# Patient Record
Sex: Male | Born: 1985
Health system: Southern US, Community
[De-identification: ages and names within clinical notes are randomized; demographics above are authoritative.]

## PROBLEM LIST (undated history)

## (undated) DIAGNOSIS — D571 Sickle-cell disease without crisis: Secondary | ICD-10-CM

## (undated) DIAGNOSIS — J189 Pneumonia, unspecified organism: Secondary | ICD-10-CM

## (undated) HISTORY — DX: Sickle-cell disease without crisis: D57.1

## (undated) HISTORY — PX: NO PAST SURGERIES: SHX2092

---

## 2011-02-21 ENCOUNTER — Emergency Department (HOSPITAL_COMMUNITY)
Admission: EM | Admit: 2011-02-21 | Discharge: 2011-02-21 | Disposition: A | Discharge status: Home / Self Care | Payer: Medicaid Other | Attending: Emergency Medicine | Admitting: Emergency Medicine

## 2011-02-21 DIAGNOSIS — S81009A Unspecified open wound, unspecified knee, initial encounter: Secondary | ICD-10-CM | POA: Insufficient documentation

## 2011-02-21 DIAGNOSIS — Y9289 Other specified places as the place of occurrence of the external cause: Secondary | ICD-10-CM | POA: Insufficient documentation

## 2011-02-21 DIAGNOSIS — W268XXA Contact with other sharp object(s), not elsewhere classified, initial encounter: Secondary | ICD-10-CM | POA: Insufficient documentation

## 2011-02-21 DIAGNOSIS — D571 Sickle-cell disease without crisis: Secondary | ICD-10-CM | POA: Insufficient documentation

## 2011-07-01 DIAGNOSIS — B338 Other specified viral diseases: Secondary | ICD-10-CM | POA: Diagnosis not present

## 2011-07-01 DIAGNOSIS — D571 Sickle-cell disease without crisis: Secondary | ICD-10-CM | POA: Diagnosis not present

## 2011-07-01 DIAGNOSIS — R52 Pain, unspecified: Secondary | ICD-10-CM | POA: Diagnosis not present

## 2011-07-01 DIAGNOSIS — R05 Cough: Secondary | ICD-10-CM | POA: Diagnosis not present

## 2011-07-01 DIAGNOSIS — R059 Cough, unspecified: Secondary | ICD-10-CM | POA: Diagnosis not present

## 2011-07-01 DIAGNOSIS — J069 Acute upper respiratory infection, unspecified: Secondary | ICD-10-CM | POA: Diagnosis not present

## 2011-07-22 DIAGNOSIS — Z79899 Other long term (current) drug therapy: Secondary | ICD-10-CM | POA: Diagnosis not present

## 2011-07-22 DIAGNOSIS — Z5181 Encounter for therapeutic drug level monitoring: Secondary | ICD-10-CM | POA: Diagnosis not present

## 2011-07-22 DIAGNOSIS — F17200 Nicotine dependence, unspecified, uncomplicated: Secondary | ICD-10-CM | POA: Diagnosis not present

## 2011-07-22 DIAGNOSIS — D571 Sickle-cell disease without crisis: Secondary | ICD-10-CM | POA: Diagnosis not present

## 2011-07-22 DIAGNOSIS — R109 Unspecified abdominal pain: Secondary | ICD-10-CM | POA: Diagnosis not present

## 2011-08-11 DIAGNOSIS — R059 Cough, unspecified: Secondary | ICD-10-CM | POA: Diagnosis not present

## 2011-08-11 DIAGNOSIS — B9789 Other viral agents as the cause of diseases classified elsewhere: Secondary | ICD-10-CM | POA: Diagnosis not present

## 2011-08-11 DIAGNOSIS — J310 Chronic rhinitis: Secondary | ICD-10-CM | POA: Diagnosis not present

## 2011-08-11 DIAGNOSIS — R05 Cough: Secondary | ICD-10-CM | POA: Diagnosis not present

## 2011-08-25 DIAGNOSIS — S81009A Unspecified open wound, unspecified knee, initial encounter: Secondary | ICD-10-CM | POA: Diagnosis not present

## 2011-08-25 DIAGNOSIS — D571 Sickle-cell disease without crisis: Secondary | ICD-10-CM | POA: Diagnosis not present

## 2011-08-25 DIAGNOSIS — Z79899 Other long term (current) drug therapy: Secondary | ICD-10-CM | POA: Diagnosis not present

## 2011-08-25 DIAGNOSIS — S81809A Unspecified open wound, unspecified lower leg, initial encounter: Secondary | ICD-10-CM | POA: Diagnosis not present

## 2011-09-02 DIAGNOSIS — D571 Sickle-cell disease without crisis: Secondary | ICD-10-CM | POA: Diagnosis not present

## 2011-09-02 DIAGNOSIS — F17200 Nicotine dependence, unspecified, uncomplicated: Secondary | ICD-10-CM | POA: Diagnosis not present

## 2011-09-02 DIAGNOSIS — Z79899 Other long term (current) drug therapy: Secondary | ICD-10-CM | POA: Diagnosis not present

## 2011-09-02 DIAGNOSIS — R109 Unspecified abdominal pain: Secondary | ICD-10-CM | POA: Diagnosis not present

## 2011-09-02 DIAGNOSIS — Z5181 Encounter for therapeutic drug level monitoring: Secondary | ICD-10-CM | POA: Diagnosis not present

## 2011-09-09 DIAGNOSIS — R51 Headache: Secondary | ICD-10-CM | POA: Diagnosis not present

## 2011-09-09 DIAGNOSIS — D5700 Hb-SS disease with crisis, unspecified: Secondary | ICD-10-CM | POA: Diagnosis not present

## 2011-09-09 DIAGNOSIS — F17200 Nicotine dependence, unspecified, uncomplicated: Secondary | ICD-10-CM | POA: Diagnosis not present

## 2011-09-09 DIAGNOSIS — M545 Low back pain, unspecified: Secondary | ICD-10-CM | POA: Diagnosis not present

## 2011-09-09 DIAGNOSIS — Z79899 Other long term (current) drug therapy: Secondary | ICD-10-CM | POA: Diagnosis not present

## 2011-09-09 DIAGNOSIS — Z880 Allergy status to penicillin: Secondary | ICD-10-CM | POA: Diagnosis not present

## 2011-09-29 DIAGNOSIS — D571 Sickle-cell disease without crisis: Secondary | ICD-10-CM | POA: Diagnosis not present

## 2011-09-29 DIAGNOSIS — J029 Acute pharyngitis, unspecified: Secondary | ICD-10-CM | POA: Diagnosis not present

## 2011-09-29 DIAGNOSIS — Z79899 Other long term (current) drug therapy: Secondary | ICD-10-CM | POA: Diagnosis not present

## 2011-09-29 DIAGNOSIS — J310 Chronic rhinitis: Secondary | ICD-10-CM | POA: Diagnosis not present

## 2011-09-29 DIAGNOSIS — IMO0001 Reserved for inherently not codable concepts without codable children: Secondary | ICD-10-CM | POA: Diagnosis not present

## 2011-09-29 DIAGNOSIS — J00 Acute nasopharyngitis [common cold]: Secondary | ICD-10-CM | POA: Diagnosis not present

## 2011-09-29 DIAGNOSIS — R070 Pain in throat: Secondary | ICD-10-CM | POA: Diagnosis not present

## 2011-10-13 DIAGNOSIS — Z79899 Other long term (current) drug therapy: Secondary | ICD-10-CM | POA: Diagnosis not present

## 2011-10-13 DIAGNOSIS — D5700 Hb-SS disease with crisis, unspecified: Secondary | ICD-10-CM | POA: Diagnosis not present

## 2011-11-03 DIAGNOSIS — Z79899 Other long term (current) drug therapy: Secondary | ICD-10-CM | POA: Diagnosis not present

## 2011-11-03 DIAGNOSIS — D5700 Hb-SS disease with crisis, unspecified: Secondary | ICD-10-CM | POA: Diagnosis not present

## 2011-11-03 DIAGNOSIS — R52 Pain, unspecified: Secondary | ICD-10-CM | POA: Diagnosis not present

## 2011-11-03 DIAGNOSIS — R51 Headache: Secondary | ICD-10-CM | POA: Diagnosis not present

## 2011-11-03 DIAGNOSIS — IMO0001 Reserved for inherently not codable concepts without codable children: Secondary | ICD-10-CM | POA: Diagnosis not present

## 2011-11-10 DIAGNOSIS — D571 Sickle-cell disease without crisis: Secondary | ICD-10-CM | POA: Diagnosis not present

## 2011-11-10 DIAGNOSIS — M549 Dorsalgia, unspecified: Secondary | ICD-10-CM | POA: Diagnosis not present

## 2011-11-10 DIAGNOSIS — M542 Cervicalgia: Secondary | ICD-10-CM | POA: Diagnosis not present

## 2011-11-15 DIAGNOSIS — D571 Sickle-cell disease without crisis: Secondary | ICD-10-CM | POA: Diagnosis not present

## 2011-11-15 DIAGNOSIS — IMO0001 Reserved for inherently not codable concepts without codable children: Secondary | ICD-10-CM | POA: Diagnosis not present

## 2011-12-02 DIAGNOSIS — F17200 Nicotine dependence, unspecified, uncomplicated: Secondary | ICD-10-CM | POA: Diagnosis not present

## 2011-12-02 DIAGNOSIS — R51 Headache: Secondary | ICD-10-CM | POA: Diagnosis not present

## 2011-12-02 DIAGNOSIS — S139XXA Sprain of joints and ligaments of unspecified parts of neck, initial encounter: Secondary | ICD-10-CM | POA: Diagnosis not present

## 2011-12-02 DIAGNOSIS — R52 Pain, unspecified: Secondary | ICD-10-CM | POA: Diagnosis not present

## 2011-12-02 DIAGNOSIS — S060X9A Concussion with loss of consciousness of unspecified duration, initial encounter: Secondary | ICD-10-CM | POA: Diagnosis not present

## 2011-12-02 DIAGNOSIS — Z5181 Encounter for therapeutic drug level monitoring: Secondary | ICD-10-CM | POA: Diagnosis not present

## 2011-12-02 DIAGNOSIS — Z79899 Other long term (current) drug therapy: Secondary | ICD-10-CM | POA: Diagnosis not present

## 2011-12-02 DIAGNOSIS — D571 Sickle-cell disease without crisis: Secondary | ICD-10-CM | POA: Diagnosis not present

## 2011-12-02 DIAGNOSIS — S060XAA Concussion with loss of consciousness status unknown, initial encounter: Secondary | ICD-10-CM | POA: Diagnosis not present

## 2012-01-06 DIAGNOSIS — IMO0001 Reserved for inherently not codable concepts without codable children: Secondary | ICD-10-CM | POA: Diagnosis not present

## 2012-01-06 DIAGNOSIS — Z79899 Other long term (current) drug therapy: Secondary | ICD-10-CM | POA: Diagnosis not present

## 2012-01-06 DIAGNOSIS — D571 Sickle-cell disease without crisis: Secondary | ICD-10-CM | POA: Diagnosis not present

## 2012-01-20 DIAGNOSIS — R52 Pain, unspecified: Secondary | ICD-10-CM | POA: Insufficient documentation

## 2012-01-20 DIAGNOSIS — Z79899 Other long term (current) drug therapy: Secondary | ICD-10-CM | POA: Diagnosis not present

## 2012-01-20 DIAGNOSIS — Z5181 Encounter for therapeutic drug level monitoring: Secondary | ICD-10-CM | POA: Diagnosis not present

## 2012-01-20 DIAGNOSIS — D571 Sickle-cell disease without crisis: Secondary | ICD-10-CM | POA: Insufficient documentation

## 2012-01-25 DIAGNOSIS — F17200 Nicotine dependence, unspecified, uncomplicated: Secondary | ICD-10-CM | POA: Diagnosis not present

## 2012-01-25 DIAGNOSIS — D5700 Hb-SS disease with crisis, unspecified: Secondary | ICD-10-CM | POA: Diagnosis not present

## 2012-01-25 DIAGNOSIS — Z79899 Other long term (current) drug therapy: Secondary | ICD-10-CM | POA: Diagnosis not present

## 2012-01-25 DIAGNOSIS — M549 Dorsalgia, unspecified: Secondary | ICD-10-CM | POA: Diagnosis not present

## 2012-02-01 DIAGNOSIS — M549 Dorsalgia, unspecified: Secondary | ICD-10-CM | POA: Diagnosis not present

## 2012-02-01 DIAGNOSIS — IMO0001 Reserved for inherently not codable concepts without codable children: Secondary | ICD-10-CM | POA: Diagnosis not present

## 2012-02-01 DIAGNOSIS — Z79899 Other long term (current) drug therapy: Secondary | ICD-10-CM | POA: Diagnosis not present

## 2012-02-01 DIAGNOSIS — D571 Sickle-cell disease without crisis: Secondary | ICD-10-CM | POA: Diagnosis not present

## 2012-02-01 DIAGNOSIS — R109 Unspecified abdominal pain: Secondary | ICD-10-CM | POA: Diagnosis not present

## 2012-02-01 DIAGNOSIS — R079 Chest pain, unspecified: Secondary | ICD-10-CM | POA: Diagnosis not present

## 2012-02-12 DIAGNOSIS — R109 Unspecified abdominal pain: Secondary | ICD-10-CM | POA: Diagnosis not present

## 2012-02-12 DIAGNOSIS — D571 Sickle-cell disease without crisis: Secondary | ICD-10-CM | POA: Diagnosis not present

## 2012-02-12 DIAGNOSIS — Z79899 Other long term (current) drug therapy: Secondary | ICD-10-CM | POA: Diagnosis not present

## 2012-02-12 DIAGNOSIS — R1084 Generalized abdominal pain: Secondary | ICD-10-CM | POA: Diagnosis not present

## 2012-02-23 DIAGNOSIS — G8929 Other chronic pain: Secondary | ICD-10-CM | POA: Diagnosis not present

## 2012-02-23 DIAGNOSIS — R079 Chest pain, unspecified: Secondary | ICD-10-CM | POA: Diagnosis not present

## 2012-02-23 DIAGNOSIS — G894 Chronic pain syndrome: Secondary | ICD-10-CM | POA: Diagnosis not present

## 2012-02-23 DIAGNOSIS — D5700 Hb-SS disease with crisis, unspecified: Secondary | ICD-10-CM | POA: Diagnosis not present

## 2012-03-09 DIAGNOSIS — M549 Dorsalgia, unspecified: Secondary | ICD-10-CM | POA: Diagnosis not present

## 2012-03-09 DIAGNOSIS — D571 Sickle-cell disease without crisis: Secondary | ICD-10-CM | POA: Diagnosis not present

## 2012-03-09 DIAGNOSIS — R5383 Other fatigue: Secondary | ICD-10-CM | POA: Diagnosis not present

## 2012-03-09 DIAGNOSIS — Z79899 Other long term (current) drug therapy: Secondary | ICD-10-CM | POA: Diagnosis not present

## 2012-03-09 DIAGNOSIS — R5381 Other malaise: Secondary | ICD-10-CM | POA: Diagnosis not present

## 2012-03-21 DIAGNOSIS — E559 Vitamin D deficiency, unspecified: Secondary | ICD-10-CM | POA: Diagnosis not present

## 2012-03-21 DIAGNOSIS — D571 Sickle-cell disease without crisis: Secondary | ICD-10-CM | POA: Diagnosis not present

## 2012-03-28 DIAGNOSIS — M549 Dorsalgia, unspecified: Secondary | ICD-10-CM | POA: Diagnosis not present

## 2012-03-28 DIAGNOSIS — M25519 Pain in unspecified shoulder: Secondary | ICD-10-CM | POA: Diagnosis not present

## 2012-03-28 DIAGNOSIS — D5700 Hb-SS disease with crisis, unspecified: Secondary | ICD-10-CM | POA: Diagnosis not present

## 2012-03-29 DIAGNOSIS — D571 Sickle-cell disease without crisis: Secondary | ICD-10-CM | POA: Diagnosis not present

## 2012-03-29 DIAGNOSIS — IMO0001 Reserved for inherently not codable concepts without codable children: Secondary | ICD-10-CM | POA: Diagnosis not present

## 2012-04-09 DIAGNOSIS — R05 Cough: Secondary | ICD-10-CM | POA: Diagnosis not present

## 2012-04-09 DIAGNOSIS — R070 Pain in throat: Secondary | ICD-10-CM | POA: Diagnosis not present

## 2012-04-09 DIAGNOSIS — R6883 Chills (without fever): Secondary | ICD-10-CM | POA: Diagnosis not present

## 2012-04-09 DIAGNOSIS — R059 Cough, unspecified: Secondary | ICD-10-CM | POA: Diagnosis not present

## 2012-04-09 DIAGNOSIS — J029 Acute pharyngitis, unspecified: Secondary | ICD-10-CM | POA: Diagnosis not present

## 2012-04-12 DIAGNOSIS — J019 Acute sinusitis, unspecified: Secondary | ICD-10-CM | POA: Diagnosis not present

## 2012-04-12 DIAGNOSIS — Z79899 Other long term (current) drug therapy: Secondary | ICD-10-CM | POA: Diagnosis not present

## 2012-04-12 DIAGNOSIS — D571 Sickle-cell disease without crisis: Secondary | ICD-10-CM | POA: Diagnosis not present

## 2012-04-12 DIAGNOSIS — R05 Cough: Secondary | ICD-10-CM | POA: Diagnosis not present

## 2012-04-12 DIAGNOSIS — R059 Cough, unspecified: Secondary | ICD-10-CM | POA: Diagnosis not present

## 2012-04-21 DIAGNOSIS — Z79899 Other long term (current) drug therapy: Secondary | ICD-10-CM | POA: Diagnosis not present

## 2012-04-21 DIAGNOSIS — E559 Vitamin D deficiency, unspecified: Secondary | ICD-10-CM

## 2012-04-21 DIAGNOSIS — D571 Sickle-cell disease without crisis: Secondary | ICD-10-CM | POA: Diagnosis not present

## 2012-04-21 DIAGNOSIS — N4830 Priapism, unspecified: Secondary | ICD-10-CM | POA: Insufficient documentation

## 2012-04-21 DIAGNOSIS — R52 Pain, unspecified: Secondary | ICD-10-CM | POA: Diagnosis not present

## 2012-04-21 DIAGNOSIS — F17200 Nicotine dependence, unspecified, uncomplicated: Secondary | ICD-10-CM | POA: Insufficient documentation

## 2012-04-21 DIAGNOSIS — Z5181 Encounter for therapeutic drug level monitoring: Secondary | ICD-10-CM | POA: Insufficient documentation

## 2012-04-21 DIAGNOSIS — F200 Paranoid schizophrenia: Secondary | ICD-10-CM

## 2012-04-21 HISTORY — DX: Paranoid schizophrenia: F20.0

## 2012-04-21 HISTORY — DX: Vitamin D deficiency, unspecified: E55.9

## 2012-05-03 DIAGNOSIS — D571 Sickle-cell disease without crisis: Secondary | ICD-10-CM | POA: Diagnosis not present

## 2012-05-03 DIAGNOSIS — IMO0001 Reserved for inherently not codable concepts without codable children: Secondary | ICD-10-CM | POA: Diagnosis not present

## 2012-05-11 DIAGNOSIS — D571 Sickle-cell disease without crisis: Secondary | ICD-10-CM | POA: Diagnosis not present

## 2012-05-11 DIAGNOSIS — Z79899 Other long term (current) drug therapy: Secondary | ICD-10-CM | POA: Diagnosis not present

## 2012-05-11 DIAGNOSIS — R51 Headache: Secondary | ICD-10-CM | POA: Diagnosis not present

## 2012-05-11 DIAGNOSIS — D7389 Other diseases of spleen: Secondary | ICD-10-CM | POA: Diagnosis not present

## 2012-05-11 DIAGNOSIS — M549 Dorsalgia, unspecified: Secondary | ICD-10-CM | POA: Diagnosis not present

## 2012-05-11 DIAGNOSIS — D473 Essential (hemorrhagic) thrombocythemia: Secondary | ICD-10-CM | POA: Diagnosis not present

## 2012-05-11 DIAGNOSIS — D739 Disease of spleen, unspecified: Secondary | ICD-10-CM | POA: Diagnosis not present

## 2012-05-11 DIAGNOSIS — D5700 Hb-SS disease with crisis, unspecified: Secondary | ICD-10-CM | POA: Diagnosis not present

## 2012-05-11 DIAGNOSIS — E559 Vitamin D deficiency, unspecified: Secondary | ICD-10-CM | POA: Diagnosis not present

## 2012-05-11 DIAGNOSIS — D57819 Other sickle-cell disorders with crisis, unspecified: Secondary | ICD-10-CM | POA: Diagnosis not present

## 2012-05-12 DIAGNOSIS — D473 Essential (hemorrhagic) thrombocythemia: Secondary | ICD-10-CM | POA: Diagnosis not present

## 2012-05-12 DIAGNOSIS — D739 Disease of spleen, unspecified: Secondary | ICD-10-CM | POA: Diagnosis not present

## 2012-05-12 DIAGNOSIS — D5700 Hb-SS disease with crisis, unspecified: Secondary | ICD-10-CM | POA: Diagnosis not present

## 2012-05-13 DIAGNOSIS — D571 Sickle-cell disease without crisis: Secondary | ICD-10-CM | POA: Diagnosis not present

## 2012-05-13 DIAGNOSIS — IMO0001 Reserved for inherently not codable concepts without codable children: Secondary | ICD-10-CM | POA: Diagnosis not present

## 2012-05-14 ENCOUNTER — Telehealth (HOSPITAL_COMMUNITY): Payer: Self-pay | Admitting: Hematology

## 2012-05-27 DIAGNOSIS — R7989 Other specified abnormal findings of blood chemistry: Secondary | ICD-10-CM | POA: Diagnosis not present

## 2012-05-27 DIAGNOSIS — I10 Essential (primary) hypertension: Secondary | ICD-10-CM | POA: Diagnosis not present

## 2012-05-27 DIAGNOSIS — H66009 Acute suppurative otitis media without spontaneous rupture of ear drum, unspecified ear: Secondary | ICD-10-CM | POA: Diagnosis not present

## 2012-05-27 DIAGNOSIS — M79609 Pain in unspecified limb: Secondary | ICD-10-CM | POA: Diagnosis not present

## 2012-06-05 DIAGNOSIS — F17200 Nicotine dependence, unspecified, uncomplicated: Secondary | ICD-10-CM | POA: Diagnosis not present

## 2012-06-05 DIAGNOSIS — R0989 Other specified symptoms and signs involving the circulatory and respiratory systems: Secondary | ICD-10-CM | POA: Diagnosis not present

## 2012-06-05 DIAGNOSIS — D5700 Hb-SS disease with crisis, unspecified: Secondary | ICD-10-CM | POA: Diagnosis not present

## 2012-06-05 DIAGNOSIS — M549 Dorsalgia, unspecified: Secondary | ICD-10-CM | POA: Diagnosis not present

## 2012-06-05 DIAGNOSIS — Z79899 Other long term (current) drug therapy: Secondary | ICD-10-CM | POA: Diagnosis not present

## 2012-06-14 DIAGNOSIS — D5700 Hb-SS disease with crisis, unspecified: Secondary | ICD-10-CM | POA: Diagnosis not present

## 2012-06-14 DIAGNOSIS — M25579 Pain in unspecified ankle and joints of unspecified foot: Secondary | ICD-10-CM | POA: Diagnosis not present

## 2012-06-29 DIAGNOSIS — Z79899 Other long term (current) drug therapy: Secondary | ICD-10-CM | POA: Diagnosis not present

## 2012-06-29 DIAGNOSIS — Z5181 Encounter for therapeutic drug level monitoring: Secondary | ICD-10-CM | POA: Diagnosis not present

## 2012-06-29 DIAGNOSIS — D571 Sickle-cell disease without crisis: Secondary | ICD-10-CM | POA: Diagnosis not present

## 2012-06-29 DIAGNOSIS — E559 Vitamin D deficiency, unspecified: Secondary | ICD-10-CM | POA: Diagnosis not present

## 2012-06-29 DIAGNOSIS — D5700 Hb-SS disease with crisis, unspecified: Secondary | ICD-10-CM | POA: Diagnosis not present

## 2012-06-29 DIAGNOSIS — F411 Generalized anxiety disorder: Secondary | ICD-10-CM | POA: Diagnosis not present

## 2012-07-08 DIAGNOSIS — M549 Dorsalgia, unspecified: Secondary | ICD-10-CM | POA: Diagnosis not present

## 2012-07-08 DIAGNOSIS — D571 Sickle-cell disease without crisis: Secondary | ICD-10-CM | POA: Diagnosis not present

## 2012-07-08 DIAGNOSIS — M545 Low back pain, unspecified: Secondary | ICD-10-CM | POA: Diagnosis not present

## 2012-07-08 DIAGNOSIS — R079 Chest pain, unspecified: Secondary | ICD-10-CM | POA: Diagnosis not present

## 2012-07-08 DIAGNOSIS — Z79899 Other long term (current) drug therapy: Secondary | ICD-10-CM | POA: Diagnosis not present

## 2012-07-08 DIAGNOSIS — G8929 Other chronic pain: Secondary | ICD-10-CM | POA: Diagnosis not present

## 2012-07-09 DIAGNOSIS — R079 Chest pain, unspecified: Secondary | ICD-10-CM | POA: Diagnosis not present

## 2012-07-14 DIAGNOSIS — D571 Sickle-cell disease without crisis: Secondary | ICD-10-CM | POA: Diagnosis not present

## 2012-07-14 DIAGNOSIS — F17200 Nicotine dependence, unspecified, uncomplicated: Secondary | ICD-10-CM | POA: Diagnosis not present

## 2012-07-14 DIAGNOSIS — M4800 Spinal stenosis, site unspecified: Secondary | ICD-10-CM | POA: Diagnosis not present

## 2012-07-21 DIAGNOSIS — Z79899 Other long term (current) drug therapy: Secondary | ICD-10-CM | POA: Diagnosis not present

## 2012-07-21 DIAGNOSIS — M79609 Pain in unspecified limb: Secondary | ICD-10-CM | POA: Diagnosis not present

## 2012-07-21 DIAGNOSIS — F17200 Nicotine dependence, unspecified, uncomplicated: Secondary | ICD-10-CM | POA: Diagnosis not present

## 2012-07-21 DIAGNOSIS — D5700 Hb-SS disease with crisis, unspecified: Secondary | ICD-10-CM | POA: Diagnosis not present

## 2012-07-28 DIAGNOSIS — D571 Sickle-cell disease without crisis: Secondary | ICD-10-CM | POA: Diagnosis not present

## 2012-07-28 DIAGNOSIS — R079 Chest pain, unspecified: Secondary | ICD-10-CM | POA: Diagnosis not present

## 2012-07-28 DIAGNOSIS — F17200 Nicotine dependence, unspecified, uncomplicated: Secondary | ICD-10-CM | POA: Diagnosis not present

## 2012-08-04 DIAGNOSIS — D5700 Hb-SS disease with crisis, unspecified: Secondary | ICD-10-CM | POA: Diagnosis not present

## 2012-08-04 DIAGNOSIS — F17200 Nicotine dependence, unspecified, uncomplicated: Secondary | ICD-10-CM | POA: Diagnosis not present

## 2012-08-04 DIAGNOSIS — Z79899 Other long term (current) drug therapy: Secondary | ICD-10-CM | POA: Diagnosis not present

## 2012-08-15 DIAGNOSIS — M545 Low back pain, unspecified: Secondary | ICD-10-CM | POA: Diagnosis not present

## 2012-08-15 DIAGNOSIS — Z760 Encounter for issue of repeat prescription: Secondary | ICD-10-CM | POA: Diagnosis not present

## 2012-08-15 DIAGNOSIS — G8929 Other chronic pain: Secondary | ICD-10-CM | POA: Diagnosis not present

## 2012-08-15 DIAGNOSIS — D5700 Hb-SS disease with crisis, unspecified: Secondary | ICD-10-CM | POA: Diagnosis not present

## 2012-08-15 DIAGNOSIS — Z79899 Other long term (current) drug therapy: Secondary | ICD-10-CM | POA: Diagnosis not present

## 2012-08-22 DIAGNOSIS — Z79899 Other long term (current) drug therapy: Secondary | ICD-10-CM | POA: Diagnosis not present

## 2012-08-22 DIAGNOSIS — IMO0002 Reserved for concepts with insufficient information to code with codable children: Secondary | ICD-10-CM | POA: Diagnosis not present

## 2012-08-22 DIAGNOSIS — F17200 Nicotine dependence, unspecified, uncomplicated: Secondary | ICD-10-CM | POA: Diagnosis not present

## 2012-08-22 DIAGNOSIS — T148XXA Other injury of unspecified body region, initial encounter: Secondary | ICD-10-CM | POA: Diagnosis not present

## 2012-08-22 DIAGNOSIS — D571 Sickle-cell disease without crisis: Secondary | ICD-10-CM | POA: Diagnosis not present

## 2012-08-26 DIAGNOSIS — R197 Diarrhea, unspecified: Secondary | ICD-10-CM | POA: Diagnosis not present

## 2012-08-26 DIAGNOSIS — M2550 Pain in unspecified joint: Secondary | ICD-10-CM | POA: Diagnosis not present

## 2012-08-26 DIAGNOSIS — Z79899 Other long term (current) drug therapy: Secondary | ICD-10-CM | POA: Diagnosis not present

## 2012-08-26 DIAGNOSIS — D5700 Hb-SS disease with crisis, unspecified: Secondary | ICD-10-CM | POA: Diagnosis not present

## 2012-09-01 ENCOUNTER — Telehealth (HOSPITAL_COMMUNITY): Payer: Self-pay

## 2012-09-01 NOTE — Telephone Encounter (Signed)
Patient called to verify, Oxycodone 10 mg is going to be mailed to [facility] for a 90 day supply. Patient stated he spoke with [facility] yesterday. This RN will check to see what has been done so far and will give patient a call back.

## 2012-09-04 DIAGNOSIS — R112 Nausea with vomiting, unspecified: Secondary | ICD-10-CM | POA: Diagnosis not present

## 2012-09-04 DIAGNOSIS — F17200 Nicotine dependence, unspecified, uncomplicated: Secondary | ICD-10-CM | POA: Diagnosis not present

## 2012-09-04 DIAGNOSIS — M549 Dorsalgia, unspecified: Secondary | ICD-10-CM | POA: Diagnosis not present

## 2012-09-04 DIAGNOSIS — D57819 Other sickle-cell disorders with crisis, unspecified: Secondary | ICD-10-CM | POA: Diagnosis not present

## 2012-09-04 DIAGNOSIS — E559 Vitamin D deficiency, unspecified: Secondary | ICD-10-CM | POA: Diagnosis not present

## 2012-09-04 DIAGNOSIS — R52 Pain, unspecified: Secondary | ICD-10-CM | POA: Diagnosis not present

## 2012-09-04 DIAGNOSIS — D5700 Hb-SS disease with crisis, unspecified: Secondary | ICD-10-CM | POA: Diagnosis not present

## 2012-09-04 DIAGNOSIS — Z79899 Other long term (current) drug therapy: Secondary | ICD-10-CM | POA: Diagnosis not present

## 2012-09-04 DIAGNOSIS — R509 Fever, unspecified: Secondary | ICD-10-CM | POA: Diagnosis not present

## 2012-09-04 DIAGNOSIS — D72829 Elevated white blood cell count, unspecified: Secondary | ICD-10-CM | POA: Diagnosis not present

## 2012-09-05 DIAGNOSIS — D57819 Other sickle-cell disorders with crisis, unspecified: Secondary | ICD-10-CM | POA: Diagnosis not present

## 2012-09-06 DIAGNOSIS — D57819 Other sickle-cell disorders with crisis, unspecified: Secondary | ICD-10-CM | POA: Diagnosis not present

## 2012-09-14 ENCOUNTER — Telehealth (HOSPITAL_COMMUNITY): Payer: Self-pay

## 2012-09-14 NOTE — Telephone Encounter (Signed)
CM received call from Kathrin Penner SW regarding patient & transportation and requesting a meeting with [facility] & counselor. Lauren SW request this CM call patient and Adalberto Ill (counselor).

## 2012-09-14 NOTE — Telephone Encounter (Signed)
CM called Alejandro Soto with [employer] per patient request. Will await a call back.

## 2012-09-14 NOTE — Telephone Encounter (Signed)
CM spoke with patient regarding his concerns about transportation. Patient states he already uses [facility] for transportation, this is the transportation resource CM had to share with patient. Per patient's request CM called & left message left with Adalberto Ill with [facility] [phone]. Patient stated he has concerns re: the small amount of pain medications, " I only get 10 days of pain meds and I need 30 days to avoid going to the ED". CM advised patient he has an appointment scheduled with Dr. Ashley Royalty on 09/21/2012 and to share his concerns during the visit. Patient agreed that he will not have any transportation concerns for that visit.  Karoline Caldwell, RN, BSN, [place] [phone]

## 2012-09-20 DIAGNOSIS — Z79899 Other long term (current) drug therapy: Secondary | ICD-10-CM | POA: Diagnosis not present

## 2012-09-20 DIAGNOSIS — D571 Sickle-cell disease without crisis: Secondary | ICD-10-CM | POA: Diagnosis not present

## 2012-09-20 DIAGNOSIS — D5700 Hb-SS disease with crisis, unspecified: Secondary | ICD-10-CM | POA: Diagnosis not present

## 2012-09-20 DIAGNOSIS — R109 Unspecified abdominal pain: Secondary | ICD-10-CM | POA: Diagnosis not present

## 2012-09-20 DIAGNOSIS — M545 Low back pain, unspecified: Secondary | ICD-10-CM | POA: Diagnosis not present

## 2012-09-20 DIAGNOSIS — G8929 Other chronic pain: Secondary | ICD-10-CM | POA: Diagnosis not present

## 2012-09-21 DIAGNOSIS — D571 Sickle-cell disease without crisis: Secondary | ICD-10-CM | POA: Diagnosis not present

## 2012-10-02 DIAGNOSIS — D5700 Hb-SS disease with crisis, unspecified: Secondary | ICD-10-CM | POA: Diagnosis not present

## 2012-10-10 DIAGNOSIS — R5381 Other malaise: Secondary | ICD-10-CM | POA: Diagnosis not present

## 2012-10-10 DIAGNOSIS — R079 Chest pain, unspecified: Secondary | ICD-10-CM | POA: Diagnosis not present

## 2012-10-10 DIAGNOSIS — D5700 Hb-SS disease with crisis, unspecified: Secondary | ICD-10-CM | POA: Diagnosis not present

## 2012-10-10 DIAGNOSIS — R5383 Other fatigue: Secondary | ICD-10-CM | POA: Diagnosis not present

## 2012-10-12 DIAGNOSIS — G8929 Other chronic pain: Secondary | ICD-10-CM | POA: Diagnosis not present

## 2012-10-12 DIAGNOSIS — D5700 Hb-SS disease with crisis, unspecified: Secondary | ICD-10-CM | POA: Diagnosis not present

## 2012-10-12 DIAGNOSIS — M79609 Pain in unspecified limb: Secondary | ICD-10-CM | POA: Diagnosis not present

## 2012-10-19 DIAGNOSIS — Z0000 Encounter for general adult medical examination without abnormal findings: Secondary | ICD-10-CM | POA: Diagnosis not present

## 2012-10-19 DIAGNOSIS — D5720 Sickle-cell/Hb-C disease without crisis: Secondary | ICD-10-CM | POA: Diagnosis not present

## 2012-10-19 DIAGNOSIS — D571 Sickle-cell disease without crisis: Secondary | ICD-10-CM | POA: Diagnosis not present

## 2012-10-19 DIAGNOSIS — R0602 Shortness of breath: Secondary | ICD-10-CM | POA: Diagnosis not present

## 2012-10-19 LAB — HEPATIC FUNCTION PANEL
ALT: 19 U/L (ref 10–40)
AST: 20 U/L (ref 14–40)
Bilirubin, Total: 0.5 mg/dL

## 2012-10-19 LAB — CBC AND DIFFERENTIAL
HCT: 31 % — AB (ref 41–53)
Hemoglobin: 11 g/dL — AB (ref 13.5–17.5)
WBC: 6.1 10^3/mL

## 2012-10-19 LAB — BASIC METABOLIC PANEL
Glucose: 90 mg/dL
Sodium: 138 mmol/L (ref 137–147)

## 2012-10-26 DIAGNOSIS — R5383 Other fatigue: Secondary | ICD-10-CM | POA: Diagnosis not present

## 2012-10-26 DIAGNOSIS — R5381 Other malaise: Secondary | ICD-10-CM | POA: Diagnosis not present

## 2012-10-26 DIAGNOSIS — IMO0001 Reserved for inherently not codable concepts without codable children: Secondary | ICD-10-CM | POA: Diagnosis not present

## 2012-10-26 DIAGNOSIS — D5700 Hb-SS disease with crisis, unspecified: Secondary | ICD-10-CM | POA: Diagnosis not present

## 2012-10-26 DIAGNOSIS — M25519 Pain in unspecified shoulder: Secondary | ICD-10-CM | POA: Diagnosis not present

## 2012-10-27 DIAGNOSIS — D5700 Hb-SS disease with crisis, unspecified: Secondary | ICD-10-CM | POA: Diagnosis not present

## 2012-10-27 DIAGNOSIS — D473 Essential (hemorrhagic) thrombocythemia: Secondary | ICD-10-CM | POA: Diagnosis not present

## 2012-10-28 DIAGNOSIS — D473 Essential (hemorrhagic) thrombocythemia: Secondary | ICD-10-CM | POA: Diagnosis not present

## 2012-10-28 DIAGNOSIS — D5700 Hb-SS disease with crisis, unspecified: Secondary | ICD-10-CM | POA: Diagnosis not present

## 2012-11-01 ENCOUNTER — Encounter: Payer: Self-pay | Admitting: General Practice

## 2012-11-01 DIAGNOSIS — M25569 Pain in unspecified knee: Secondary | ICD-10-CM

## 2012-11-01 DIAGNOSIS — M549 Dorsalgia, unspecified: Secondary | ICD-10-CM

## 2012-11-01 DIAGNOSIS — M25519 Pain in unspecified shoulder: Secondary | ICD-10-CM | POA: Insufficient documentation

## 2012-11-02 ENCOUNTER — Ambulatory Visit (INDEPENDENT_AMBULATORY_CARE_PROVIDER_SITE_OTHER): Payer: Medicare Other | Admitting: Primary Care

## 2012-11-02 ENCOUNTER — Encounter: Payer: Self-pay | Admitting: Primary Care

## 2012-11-02 VITALS — BP 113/76 | HR 72 | Temp 98.4°F | Ht 68.0 in | Wt 136.0 lb

## 2012-11-02 DIAGNOSIS — G894 Chronic pain syndrome: Secondary | ICD-10-CM | POA: Diagnosis not present

## 2012-11-02 DIAGNOSIS — F17200 Nicotine dependence, unspecified, uncomplicated: Secondary | ICD-10-CM

## 2012-11-02 DIAGNOSIS — D571 Sickle-cell disease without crisis: Secondary | ICD-10-CM | POA: Diagnosis not present

## 2012-11-02 NOTE — Patient Instructions (Addendum)
Schedule self appt for eye exam  Cardiologist called during appt and rescheduled for November 25, 2012 at 11:00 am

## 2012-11-02 NOTE — Progress Notes (Signed)
Patient ID: Alejandro Soto, male   DOB: 1986-03-01, 27 y.o.   MRN: [id number] SICKLE CELL  PROGRESS NOTE  Wyat Infinger WUJ:[id number] DOB: 07/04/1985 DOA: (Not on file) PCP: MATTHEWS,Ryun Velez A., MD  SUBJECTIVE: Alejandro Soto is in today to review labs and needing a refill oxycodone. Also, states missed his cardiology appt yesterday due to a family emergency.  He is feeling all right " I'm fine" except a little pain in my  lower back rates pain as 6/10 ( out of analgesic ) if he has meds available pain is controlled. He continues to smoke but has been cutting back down to 2-3 a day; he is considering purchasing a electronic cigarette to stop.    Consultants:  Cardiologist to be rescheduled   Review of Systems - Negative except low back pain  Current Outpatient Prescriptions on File Prior to Visit  Medication Sig Dispense Refill  . cholecalciferol (VITAMIN D) 1000 UNITS tablet Take 1,000 Units by mouth daily.      . folic acid (FOLVITE) 1 MG tablet Take 1 mg by mouth daily.      . hydroxyurea (HYDREA) 500 MG capsule Take 1,500 mg by mouth daily. May take with food to minimize GI side effects.      Marland Kitchen ibuprofen (ADVIL,MOTRIN) 600 MG tablet Take 600 mg by mouth as needed for pain.      . Multiple Vitamin (MULTIVITAMIN) tablet Take 1 tablet by mouth daily.      Marland Kitchen morphine (MS CONTIN) 15 MG 12 hr tablet Take 15 mg by mouth 2 (two) times daily.      Marland Kitchen oxyCODONE (OXYCONTIN) 10 MG 12 hr tablet Take 10 mg by mouth every 6 (six) hours as needed for pain.       No current facility-administered medications on file prior to visit.   Objective: Filed Vitals:   11/02/12 1037  BP: 113/76  Pulse: 72  Temp: 98.4 F (36.9 C)  TempSrc: Oral  Height: 5\' 8"  (1.727 m)  Weight: 136 lb (61.689 kg)  SpO2: 100%    Exam: Generalized: Well-developed well-nourished, appears stated age, in no acute distress.  NECK: supple, no cervical lymphadenopathy or thyroidmegaly.  LUNGS: Clear to auscultation No rhonchi, rales  or wheezes. No tactile fremitus  CARDIOVASCULAR: Normal S1, S2 without S3.  ABDOMEN: Soft, Non tender slight distended; +B.S.  GU: No priapism.  Extremities: no ankle edema, peripheral pulses 2+  MUSCLE SKELETAL: No tenderness. No edema. NEURO: grossly Intact  Data Reviewed: COMPREHENSIVE METABOLIC PANEL: I note some minor lab abnormalities that have been stable over time, these are of doubtful clinical significance.   CBC    Component Value Date/Time   WBC 6.1 10/19/2012   HGB 11.0* 10/19/2012   HCT 31* 10/19/2012   PLT 193 10/19/2012    Studies: No results found.   Assessment/Plan:  1. Sickle Cell disease SS genotype he is with out crisis: stable at this time pain is controlled on Oxycodone 10mg  Q 6hrs during pain crisis may take Q 4hrs . He is aware if needs to take medication other than prescribed he needs to call the office.  Per [facility] controlled substance agreement  1 prescriber for analgesic reinforced. Continue Folic acid 1mg   QD and hydroxurea 1500 mg QD. Last visit Ms Contin d/c.  2. Chronic pain secondary to SCD: prescription given for Oxycodone 10mg  Q 6hr prn  # 120 3. Tobacco Abuse : We discussed cessation he verbalized not wanting to take medication but is willing to buy  a electronic cigarette.    Disposition Plan:   F/u in 2 months  1 month for medication refill  Health maintenance Eye exam last one 2013 pt to schedule Spectrometry completed 11/13   Reschedule cardiologist for 6/14  Gwinda Passe, NP-C  [facility]

## 2012-11-07 ENCOUNTER — Encounter: Payer: Self-pay | Admitting: *Deleted

## 2012-11-09 DIAGNOSIS — M542 Cervicalgia: Secondary | ICD-10-CM | POA: Diagnosis not present

## 2012-11-09 DIAGNOSIS — D5700 Hb-SS disease with crisis, unspecified: Secondary | ICD-10-CM | POA: Diagnosis not present

## 2012-11-09 DIAGNOSIS — M549 Dorsalgia, unspecified: Secondary | ICD-10-CM | POA: Diagnosis not present

## 2012-11-10 ENCOUNTER — Telehealth (HOSPITAL_COMMUNITY): Payer: Self-pay | Admitting: Primary Care

## 2012-11-10 DIAGNOSIS — D571 Sickle-cell disease without crisis: Secondary | ICD-10-CM | POA: Diagnosis not present

## 2012-11-10 DIAGNOSIS — F17200 Nicotine dependence, unspecified, uncomplicated: Secondary | ICD-10-CM | POA: Diagnosis not present

## 2012-11-10 DIAGNOSIS — M549 Dorsalgia, unspecified: Secondary | ICD-10-CM | POA: Diagnosis not present

## 2012-11-10 DIAGNOSIS — M545 Low back pain, unspecified: Secondary | ICD-10-CM | POA: Diagnosis not present

## 2012-11-10 DIAGNOSIS — Z79899 Other long term (current) drug therapy: Secondary | ICD-10-CM | POA: Diagnosis not present

## 2012-11-10 DIAGNOSIS — G8929 Other chronic pain: Secondary | ICD-10-CM | POA: Diagnosis not present

## 2012-11-13 DIAGNOSIS — M549 Dorsalgia, unspecified: Secondary | ICD-10-CM | POA: Diagnosis not present

## 2012-11-13 DIAGNOSIS — Z791 Long term (current) use of non-steroidal anti-inflammatories (NSAID): Secondary | ICD-10-CM | POA: Diagnosis not present

## 2012-11-13 DIAGNOSIS — F17200 Nicotine dependence, unspecified, uncomplicated: Secondary | ICD-10-CM | POA: Diagnosis not present

## 2012-11-13 DIAGNOSIS — E876 Hypokalemia: Secondary | ICD-10-CM | POA: Diagnosis not present

## 2012-11-13 DIAGNOSIS — M545 Low back pain, unspecified: Secondary | ICD-10-CM | POA: Diagnosis not present

## 2012-11-13 DIAGNOSIS — D5700 Hb-SS disease with crisis, unspecified: Secondary | ICD-10-CM | POA: Diagnosis not present

## 2012-11-14 DIAGNOSIS — F17200 Nicotine dependence, unspecified, uncomplicated: Secondary | ICD-10-CM | POA: Diagnosis not present

## 2012-11-14 DIAGNOSIS — M549 Dorsalgia, unspecified: Secondary | ICD-10-CM | POA: Diagnosis not present

## 2012-11-14 DIAGNOSIS — D5700 Hb-SS disease with crisis, unspecified: Secondary | ICD-10-CM | POA: Diagnosis not present

## 2012-11-14 DIAGNOSIS — E876 Hypokalemia: Secondary | ICD-10-CM | POA: Diagnosis not present

## 2012-11-15 ENCOUNTER — Telehealth: Payer: Self-pay | Admitting: *Deleted

## 2012-11-16 ENCOUNTER — Telehealth (HOSPITAL_COMMUNITY): Payer: Self-pay | Admitting: Primary Care

## 2012-11-16 ENCOUNTER — Other Ambulatory Visit: Payer: Self-pay | Admitting: Primary Care

## 2012-11-16 MED ORDER — OXYCODONE HCL 10 MG PO TABS: 10.0000 mg | ORAL_TABLET | Freq: Four times a day (QID) | ORAL | Status: DC | PRN

## 2012-11-16 NOTE — Telephone Encounter (Signed)
Refill request oxycodone 10 mg Q 6 hrs  #60 last filled on 11/02/12

## 2012-11-17 MED ORDER — OXYCODONE HCL 10 MG PO TABS: 10.0000 mg | ORAL_TABLET | Freq: Four times a day (QID) | ORAL | Status: DC | PRN

## 2012-12-01 DIAGNOSIS — D571 Sickle-cell disease without crisis: Secondary | ICD-10-CM | POA: Diagnosis not present

## 2012-12-01 DIAGNOSIS — D57819 Other sickle-cell disorders with crisis, unspecified: Secondary | ICD-10-CM | POA: Diagnosis not present

## 2012-12-01 DIAGNOSIS — D5700 Hb-SS disease with crisis, unspecified: Secondary | ICD-10-CM | POA: Diagnosis not present

## 2012-12-01 DIAGNOSIS — R21 Rash and other nonspecific skin eruption: Secondary | ICD-10-CM | POA: Diagnosis not present

## 2012-12-01 DIAGNOSIS — Z87891 Personal history of nicotine dependence: Secondary | ICD-10-CM | POA: Diagnosis not present

## 2012-12-01 DIAGNOSIS — R51 Headache: Secondary | ICD-10-CM | POA: Diagnosis not present

## 2012-12-01 DIAGNOSIS — D638 Anemia in other chronic diseases classified elsewhere: Secondary | ICD-10-CM | POA: Diagnosis not present

## 2012-12-01 DIAGNOSIS — M549 Dorsalgia, unspecified: Secondary | ICD-10-CM | POA: Diagnosis not present

## 2012-12-01 DIAGNOSIS — M545 Low back pain, unspecified: Secondary | ICD-10-CM | POA: Diagnosis not present

## 2012-12-01 DIAGNOSIS — Z832 Family history of diseases of the blood and blood-forming organs and certain disorders involving the immune mechanism: Secondary | ICD-10-CM | POA: Diagnosis not present

## 2012-12-01 DIAGNOSIS — Z885 Allergy status to narcotic agent status: Secondary | ICD-10-CM | POA: Diagnosis not present

## 2012-12-01 DIAGNOSIS — Z79899 Other long term (current) drug therapy: Secondary | ICD-10-CM | POA: Diagnosis not present

## 2012-12-01 DIAGNOSIS — D6959 Other secondary thrombocytopenia: Secondary | ICD-10-CM | POA: Diagnosis not present

## 2012-12-01 DIAGNOSIS — M47817 Spondylosis without myelopathy or radiculopathy, lumbosacral region: Secondary | ICD-10-CM | POA: Diagnosis not present

## 2012-12-01 DIAGNOSIS — M48061 Spinal stenosis, lumbar region without neurogenic claudication: Secondary | ICD-10-CM | POA: Diagnosis not present

## 2012-12-01 DIAGNOSIS — M79609 Pain in unspecified limb: Secondary | ICD-10-CM | POA: Diagnosis not present

## 2012-12-01 DIAGNOSIS — D696 Thrombocytopenia, unspecified: Secondary | ICD-10-CM | POA: Diagnosis not present

## 2012-12-03 DIAGNOSIS — D5700 Hb-SS disease with crisis, unspecified: Secondary | ICD-10-CM | POA: Diagnosis not present

## 2012-12-03 DIAGNOSIS — M549 Dorsalgia, unspecified: Secondary | ICD-10-CM | POA: Diagnosis not present

## 2012-12-12 ENCOUNTER — Encounter: Payer: Self-pay | Admitting: Internal Medicine

## 2012-12-12 ENCOUNTER — Ambulatory Visit (INDEPENDENT_AMBULATORY_CARE_PROVIDER_SITE_OTHER): Payer: Medicare Other | Admitting: Internal Medicine

## 2012-12-12 VITALS — BP 111/65 | HR 73 | Temp 98.3°F | Resp 18 | Ht 66.0 in | Wt 138.0 lb

## 2012-12-12 DIAGNOSIS — D5700 Hb-SS disease with crisis, unspecified: Secondary | ICD-10-CM | POA: Diagnosis not present

## 2012-12-12 MED ORDER — OXYCODONE HCL 15 MG PO TABS: 15.0000 mg | ORAL_TABLET | Freq: Four times a day (QID) | ORAL | Status: DC | PRN

## 2012-12-12 NOTE — Progress Notes (Signed)
Subjective:    Patient ID: Alejandro Soto, male    DOB: 1985-08-05, 27 y.o.   MRN: [id number]  HPI: Patient with Hb SS here today for followup post hospital visit. Patient was admitted to the [facility] last week with a hospital stay of 4 days. The patient states that since coming to the hospital his pain is relatively well-controlled. He states that he has no medication and also reports that his usual medication of oxycodone 10 mg does not appear to be controlling her pain adequately. The patient is actually quite active. 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 He also has been actively engaged in exercise i.e. basketball on almost daily basis. The patient identify what her stressors as his father's illness. He states his brothers recently been discharged from the hospital and has a PICC line. He's been assisted in his father with managing his medications. The father lives in [place] and thus the patient has been residing mostly in [place].  With regard to his symptoms the patient states his pain is mostly localized to his back and legs. He rates the pain as an intensity of 4/10. He describes the pain as throbbing in nature, nonradiating and not associated with any other symptoms. He denies any fever, chills, nausea, vomiting or diarrhea.    Review of Systems  Constitutional: Negative.   HENT: Negative.   Eyes: Negative.   Respiratory: Negative.   Cardiovascular: Negative.   Gastrointestinal: Negative.   Endocrine: Negative.   Genitourinary: Negative.   Musculoskeletal: Positive for myalgias and arthralgias.  Skin: Negative.   Allergic/Immunologic: Negative.   Neurological: Negative.   Hematological: Negative.   Psychiatric/Behavioral: Negative.        Objective:   Physical Exam  Constitutional: He is oriented to person, place, and time. He appears well-developed and well-nourished. No distress.  HENT:  Head:  Atraumatic.  Eyes: Conjunctivae and EOM are normal. Pupils are equal, round, and reactive to light. No scleral icterus.  Neck: Normal range of motion. Neck supple.  Cardiovascular: Normal rate and regular rhythm.  Exam reveals no gallop and no friction rub.   No murmur heard. Pulmonary/Chest: Effort normal and breath sounds normal. He has no wheezes. He has no rales. He exhibits no tenderness.  Abdominal: Soft. Bowel sounds are normal. He exhibits no mass.  Musculoskeletal: Normal range of motion.  Neurological: He is alert and oriented to person, place, and time.  Skin: Skin is warm and dry.  Psychiatric: He has a normal mood and affect. His behavior is normal. Judgment and thought content normal.          Assessment & Plan:  1. Hb SS: The patient has had a hospitalization approximately every month for the last 3 months. I discussed with the patient what factors could be contributing to this it appears the patient may be overexerting himself and not pacing himself adequately in order to prevent a trigger of his crisis. A long discussion with the patient regarding balance rest and activity, hydration and managing stress. The patient will continue hydrea, folic acid at current doses. I have increased oxycodone to 15 mg every 6 hours as needed and we will reevaluate the effectiveness of all the above in approximately one month. The patient is unable to stay to have labs drawn today. I have given the patient a written prescription to have his labs drawn in [facility] and have the results faxed to  [place] for evaluation.  Total time 32 minutes. Greater than 50% of time spent in counseling and discussion regarding management of chronic disease (sickle cell disease) and management of opiates in controlling pain.  Labs: CBC with differential, CMET., reticulocyte, ferritin.

## 2012-12-15 DIAGNOSIS — D571 Sickle-cell disease without crisis: Secondary | ICD-10-CM | POA: Diagnosis not present

## 2012-12-29 ENCOUNTER — Telehealth: Payer: Self-pay | Admitting: Internal Medicine

## 2012-12-29 DIAGNOSIS — D5700 Hb-SS disease with crisis, unspecified: Secondary | ICD-10-CM

## 2012-12-29 MED ORDER — OXYCODONE HCL 15 MG PO TABS: 15.0000 mg | ORAL_TABLET | Freq: Four times a day (QID) | ORAL | Status: DC | PRN

## 2012-12-29 NOTE — Telephone Encounter (Signed)
Refill request for Oxy ir 15 mg Q 6 hrs prn for pain. Last written on 12/12/12 # 90 a 22 day supply refill is ready for pick up on Monday.

## 2012-12-30 ENCOUNTER — Telehealth: Payer: Self-pay | Admitting: *Deleted

## 2013-01-02 ENCOUNTER — Ambulatory Visit: Payer: Medicare Other | Admitting: Primary Care

## 2013-01-02 NOTE — Telephone Encounter (Signed)
12/30/2012 3:00 PM Phone (Incoming) Alejandro Soto, Alejandro Soto (Self) [phone] (H) Pt. called and ask if his prescription was ready for pick up. He explained that he would not be able to pick it up until Monday July, 14, 2014 because he is at the hospital with his father who is in surgery. I told him January 02, 2013 was fine

## 2013-01-09 DIAGNOSIS — Z79899 Other long term (current) drug therapy: Secondary | ICD-10-CM | POA: Diagnosis not present

## 2013-01-09 DIAGNOSIS — D5700 Hb-SS disease with crisis, unspecified: Secondary | ICD-10-CM | POA: Diagnosis not present

## 2013-01-09 DIAGNOSIS — Z886 Allergy status to analgesic agent status: Secondary | ICD-10-CM | POA: Diagnosis not present

## 2013-01-11 ENCOUNTER — Encounter: Payer: Self-pay | Admitting: Primary Care

## 2013-01-11 ENCOUNTER — Ambulatory Visit (INDEPENDENT_AMBULATORY_CARE_PROVIDER_SITE_OTHER): Payer: Medicare Other | Admitting: Primary Care

## 2013-01-11 VITALS — BP 108/68 | HR 77 | Temp 98.4°F | Wt 137.0 lb

## 2013-01-11 DIAGNOSIS — M25519 Pain in unspecified shoulder: Secondary | ICD-10-CM

## 2013-01-11 DIAGNOSIS — D5700 Hb-SS disease with crisis, unspecified: Secondary | ICD-10-CM | POA: Diagnosis not present

## 2013-01-11 DIAGNOSIS — M25511 Pain in right shoulder: Secondary | ICD-10-CM

## 2013-01-11 MED ORDER — OXYCODONE HCL 15 MG PO TABS: 15.0000 mg | ORAL_TABLET | Freq: Four times a day (QID) | ORAL | Status: DC | PRN

## 2013-01-11 NOTE — Progress Notes (Signed)
Patient: Alejandro Soto DOB :September 04, 1985 MRN :[id number]  Date: 01/11/2013  Documentation Initiated by : Jefm Miles  Subjective/Objective Assessment: Mr. Kishi is a 27 year old male with known SCD, in for a follow up appointment. Edwards NP request an appointment for eye exam. Mr. Nitta is taking care of his grandfather and for the next 2 weeks can only have evening appointments.    Barriers to Care: Need eye exam   Prior Approval (PA) #: NA PA start date: NA PA end date:  NA  Action/Plan:  Time spent: 15 minutes  Karoline Caldwell, RN, BSN, [place]    [phone]

## 2013-01-11 NOTE — Progress Notes (Signed)
SICKLE CELL SERVICE PROGRESS NOTE  Account Test [id number] DOB: 06/22/1968 DOA: (Not on file) PCP: Marthann Schiller, MD 01/11/2013   Chief Complaint  Patient presents with  . Follow-up    Subjective: Alejandro Soto is in today for follow up on effectiveness of his change in dosage of analgesics. He feels it seems to be working but most recently his right shoulder has been hurting since July 5th , 2014 .He is unable to identify any provocatives other than ADL's including driving. Note he is left handed . Palliative factors is not sleeping on his right  side any pressure causes increase pain. Pain is describe as sharp and annoying and intermittent.  Note back consistently hurts . Combined pain in shoulder and lower back 7/10. Baseline pain 4/5. No functional decline in right shoulder full range of motion unable to duplicate the pain.  Allergies  Allergen Reactions  . Amoxicillin Diarrhea  . Morphine And Related Other (See Comments)    Made his stomach hurt     Outpatient Encounter Prescriptions as of 01/11/2013  Medication Sig Dispense Refill  . cholecalciferol (VITAMIN D) 1000 UNITS tablet Take 1,000 Units by mouth daily.      . folic acid (FOLVITE) 1 MG tablet Take 1 mg by mouth daily.      . hydroxyurea (HYDREA) 500 MG capsule Take 1,500 mg by mouth daily. May take with food to minimize GI side effects.      Marland Kitchen ibuprofen (ADVIL,MOTRIN) 600 MG tablet Take 600 mg by mouth as needed for pain.      . Multiple Vitamin (MULTIVITAMIN) tablet Take 1 tablet by mouth daily.      Marland Kitchen oxyCODONE (ROXICODONE) 15 MG immediate release tablet Take 1 tablet (15 mg total) by mouth every 6 (six) hours as needed for pain.  90 tablet  0  . vitamin C (ASCORBIC ACID) 500 MG tablet Take 500 mg by mouth daily.       No facility-administered encounter medications on file as of 01/11/2013.   Physical Exam   Filed Vitals:   01/11/13 1302  BP: 108/68  Pulse: 77  Temp: 98.4 F (36.9 C)    General: Alert,  awake, oriented x3, in no acute distress.  HEENT: [place]/AT PEERL, EOMI Neck: Trachea midline,  no masses, no thyromegal,y no JVD, no carotid bruit OROPHARYNX:  Moist, No exudate/ erythema/lesions.  Heart: Regular rate and rhythm, without murmurs, rubs, gallops Lungs: Clear to auscultation, no wheezing or rhonchi noted. No increased vocal fremitus resonant to percussion  Abdomen: Soft, nontender, nondistended, positive bowel sounds, no masses no hepatosplenomegaly noted..  Neuro: No focal neurological deficits noted cranial nerves II through XII grossly intact. DTRs 2+ bilaterally upper and lower extremities. Strength 5 out of 5 in bilateral upper and lower extremities. Musculoskeletal: No warm swelling or erythema around joints, no spinal tenderness noted. Psychiatric: Patient alert and oriented x3 Lymph node survey: No cervical axillary     Assessment/Plan:  Sickle cell disease:  Hgb SS- stable at this time on current dose of oxycodone 15mg  Q 6hrs prn for pain.Requesting to increase frequency to Q 4hrs. Advised to take ibuprofen in between TID and would re-evaluate on next visit. Pt not taking ibuprofen regularly.  Continue folic acid 1 mg QD and hydroxurea .  Acute on chronic pain syndrome: Encourage taking anti inflammatory on a more consistently as a adjunct therapy for the inflammatory/reative phase of SCD.  Health maintenance- CM referred for assistance with optometry  Exam needed insurance will not  pay. Echo never reschedule  Hgb Electrophoresis , CBC, Mg, CMET   Gwinda Passe, NP- C

## 2013-01-16 DIAGNOSIS — T6391XA Toxic effect of contact with unspecified venomous animal, accidental (unintentional), initial encounter: Secondary | ICD-10-CM | POA: Diagnosis not present

## 2013-01-16 DIAGNOSIS — Z79899 Other long term (current) drug therapy: Secondary | ICD-10-CM | POA: Diagnosis not present

## 2013-01-16 DIAGNOSIS — Z886 Allergy status to analgesic agent status: Secondary | ICD-10-CM | POA: Diagnosis not present

## 2013-01-16 DIAGNOSIS — T63461A Toxic effect of venom of wasps, accidental (unintentional), initial encounter: Secondary | ICD-10-CM | POA: Diagnosis not present

## 2013-01-16 DIAGNOSIS — D571 Sickle-cell disease without crisis: Secondary | ICD-10-CM | POA: Diagnosis not present

## 2013-01-16 DIAGNOSIS — F17200 Nicotine dependence, unspecified, uncomplicated: Secondary | ICD-10-CM | POA: Diagnosis not present

## 2013-01-26 ENCOUNTER — Telehealth (HOSPITAL_COMMUNITY): Payer: Self-pay

## 2013-01-26 NOTE — Telephone Encounter (Signed)
This CM left voicemail message for Alejandro Soto re: his appointment scheduled 02/08/13 at 3:30pm with Dr. Nadyne Coombes (ph# [phone]).    Karoline Caldwell, RN, BSN, [place]    [phone]

## 2013-01-27 ENCOUNTER — Telehealth (HOSPITAL_COMMUNITY): Payer: Self-pay

## 2013-01-27 NOTE — Telephone Encounter (Signed)
This CM spoke with [name] to advise of his scheduled eye exam on 02/08/13 @ 3:30pm with Dr. Jacelyn Pi office.      Karoline Caldwell, RN, BSN, [place]    [phone]

## 2013-01-28 DIAGNOSIS — D571 Sickle-cell disease without crisis: Secondary | ICD-10-CM | POA: Diagnosis not present

## 2013-01-28 DIAGNOSIS — M79609 Pain in unspecified limb: Secondary | ICD-10-CM | POA: Diagnosis not present

## 2013-01-28 DIAGNOSIS — R079 Chest pain, unspecified: Secondary | ICD-10-CM | POA: Diagnosis not present

## 2013-01-29 DIAGNOSIS — R079 Chest pain, unspecified: Secondary | ICD-10-CM | POA: Diagnosis not present

## 2013-02-06 DIAGNOSIS — R51 Headache: Secondary | ICD-10-CM | POA: Diagnosis not present

## 2013-02-06 DIAGNOSIS — R52 Pain, unspecified: Secondary | ICD-10-CM | POA: Diagnosis not present

## 2013-02-06 DIAGNOSIS — Z79899 Other long term (current) drug therapy: Secondary | ICD-10-CM | POA: Diagnosis not present

## 2013-02-06 DIAGNOSIS — D571 Sickle-cell disease without crisis: Secondary | ICD-10-CM | POA: Diagnosis not present

## 2013-02-06 DIAGNOSIS — F17200 Nicotine dependence, unspecified, uncomplicated: Secondary | ICD-10-CM | POA: Diagnosis not present

## 2013-02-06 DIAGNOSIS — Z885 Allergy status to narcotic agent status: Secondary | ICD-10-CM | POA: Diagnosis not present

## 2013-02-08 ENCOUNTER — Ambulatory Visit (HOSPITAL_COMMUNITY)
Admission: AD | Admit: 2013-02-08 | Discharge: 2013-02-08 | Disposition: A | Discharge status: Home / Self Care | Payer: Medicare Other | Source: Ambulatory Visit | Attending: Internal Medicine | Admitting: Internal Medicine

## 2013-02-08 ENCOUNTER — Ambulatory Visit (INDEPENDENT_AMBULATORY_CARE_PROVIDER_SITE_OTHER): Payer: Medicare Other | Admitting: Primary Care

## 2013-02-08 ENCOUNTER — Encounter: Payer: Self-pay | Admitting: Primary Care

## 2013-02-08 VITALS — BP 119/73 | HR 83 | Temp 98.6°F | Resp 14 | Ht 67.5 in | Wt 147.0 lb

## 2013-02-08 DIAGNOSIS — M25511 Pain in right shoulder: Secondary | ICD-10-CM

## 2013-02-08 DIAGNOSIS — D5700 Hb-SS disease with crisis, unspecified: Secondary | ICD-10-CM | POA: Diagnosis not present

## 2013-02-08 DIAGNOSIS — D571 Sickle-cell disease without crisis: Secondary | ICD-10-CM | POA: Insufficient documentation

## 2013-02-08 DIAGNOSIS — M25519 Pain in unspecified shoulder: Secondary | ICD-10-CM

## 2013-02-08 DIAGNOSIS — G8929 Other chronic pain: Secondary | ICD-10-CM | POA: Diagnosis not present

## 2013-02-08 DIAGNOSIS — Z79899 Other long term (current) drug therapy: Secondary | ICD-10-CM | POA: Diagnosis not present

## 2013-02-08 DIAGNOSIS — M545 Low back pain, unspecified: Secondary | ICD-10-CM | POA: Insufficient documentation

## 2013-02-08 LAB — CBC WITH DIFFERENTIAL/PLATELET
Eosinophils Absolute: 0.1 10*3/uL (ref 0.0–0.7)
Eosinophils Relative: 1 % (ref 0–5)
HCT: 32 % — ABNORMAL LOW (ref 39.0–52.0)
Lymphs Abs: 4.4 10*3/uL — ABNORMAL HIGH (ref 0.7–4.0)
MCH: 36.9 pg — ABNORMAL HIGH (ref 26.0–34.0)
MCV: 101.9 fL — ABNORMAL HIGH (ref 78.0–100.0)
Monocytes Absolute: 0.7 10*3/uL (ref 0.1–1.0)
Platelets: 848 10*3/uL — ABNORMAL HIGH (ref 150–400)
RBC: 3.14 MIL/uL — ABNORMAL LOW (ref 4.22–5.81)
RDW: 15.1 % (ref 11.5–15.5)

## 2013-02-08 LAB — COMPREHENSIVE METABOLIC PANEL
ALT: 12 U/L (ref 0–53)
CO2: 26 mEq/L (ref 19–32)
Calcium: 9 mg/dL (ref 8.4–10.5)
Creatinine, Ser: 0.74 mg/dL (ref 0.50–1.35)
GFR calc Af Amer: >90 mL/min (ref >90)
GFR calc non Af Amer: >90 mL/min (ref >90)
Glucose, Bld: 80 mg/dL (ref 70–99)
Sodium: 139 mEq/L (ref 135–145)
Total Protein: 7 g/dL (ref 6.0–8.3)

## 2013-02-08 LAB — RETICULOCYTES
RBC.: 3.14 MIL/uL — ABNORMAL LOW (ref 4.22–5.81)
Retic Ct Pct: 7.1 % — ABNORMAL HIGH (ref 0.4–3.1)

## 2013-02-08 LAB — MAGNESIUM: Magnesium: 2 mg/dL (ref 1.5–2.5)

## 2013-02-08 MED ORDER — OXYCODONE HCL 15 MG PO TABS: 15.0000 mg | ORAL_TABLET | Freq: Four times a day (QID) | ORAL | Status: DC | PRN

## 2013-02-08 NOTE — Procedures (Signed)
[facility]  Procedure Note  Alejandro Soto QMV:[id number] DOB: [birth year] DOA: 02/08/2013   PCP: MATTHEWS,MICHELLE A., MD   Associated Diagnosis: sickle cell  Procedure Note: labs drawn per order   Condition During Procedure:patient tolerated without any difficulties   Condition at Discharge:band aid in place, no complaints   TATUM, Journee Bobrowski, RN  [facility]

## 2013-02-08 NOTE — Progress Notes (Signed)
Patient ID: Alejandro Soto, male   DOB: 1985-09-21, 27 y.o.   MRN: [id number] SICKLE CELL SERVICE PROGRESS NOTE  Account Test [id number] DOB: 06/22/1968 DOA: (Not on file) PCP: Alejandro Schiller, MD 02/08/2013   Chief Complaint  Patient presents with  . Back Pain    times 2 days  . Medication Refill    Subjective: [name] is a 27 y/o AAM with SS genotype SCD. In today for a f/u for right shoulder pain which was self limited and has resolved. He has recently been treated in [facility] for a headache which lasted 3 days and no relief was given new dx of migraine and a prescription which he threw away because he did not want any conflict with his [place]. He is also needing a refill on his oxycodone. Chronic back pain which he describes as constant achy, sharp at times and feels like something is pulling. Rates pain 5/10 baseline 3/10. Provacative factors has been the changing in the weather. Palliative factors are a heating pad and short walks.   Back Pain This is a chronic problem. The problem occurs constantly. The problem is unchanged. The pain is present in the lumbar spine. The quality of the pain is described as aching and stabbing. The pain does not radiate. The pain is at a severity of 5/10. The pain is mild. The pain is worse during the night. The symptoms are aggravated by bending and twisting. Associated symptoms include headaches.     Review of Systems  Constitutional: Negative.   HENT:       Resolved   Eyes: Negative.   Respiratory: Negative.   Genitourinary: Negative.   Musculoskeletal: Positive for back pain.       Lower back   Skin: Negative.   Neurological: Positive for headaches.  Endo/Heme/Allergies: Negative.   Psychiatric/Behavioral: Negative.      Allergies  Allergen Reactions  . Amoxicillin Diarrhea  . Morphine And Related Other (See Comments)    Made his stomach hurt     Outpatient Encounter Prescriptions  as of 02/08/2013  Medication Sig Dispense Refill  . cholecalciferol (VITAMIN D) 1000 UNITS tablet Take 1,000 Units by mouth daily.      . folic acid (FOLVITE) 1 MG tablet Take 1 mg by mouth daily.      . hydroxyurea (HYDREA) 500 MG capsule Take 1,500 mg by mouth daily. May take with food to minimize GI side effects.      Marland Kitchen ibuprofen (ADVIL,MOTRIN) 600 MG tablet Take 600 mg by mouth as needed for pain.      Marland Kitchen oxyCODONE (ROXICODONE) 15 MG immediate release tablet Take 1 tablet (15 mg total) by mouth every 6 (six) hours as needed for pain.  90 tablet  0  . vitamin C (ASCORBIC ACID) 500 MG tablet Take 500 mg by mouth daily.      . Multiple Vitamin (MULTIVITAMIN) tablet Take 1 tablet by mouth daily.       No facility-administered encounter medications on file as of 02/08/2013.    Physical Exam   Filed Vitals:   02/08/13 1402  BP: 119/73  Pulse: 83  Temp: 98.6 F (37 C)  Resp: 14    General: Alert, awake, oriented x3, in no acute distress.  HEENT: [place]/AT PEERL, EOMI Neck: Trachea midline,  no masses, no thyromegal,y no JVD, no carotid bruit OROPHARYNX:  Moist, No exudate/ erythema/lesions.  Heart: Regular rate and rhythm, without murmurs, rubs, gallops, PMI non-displaced, no heaves or  thrills on palpation.  Lungs: Clear to auscultation, no wheezing or rhonchi noted. No increased vocal fremitus resonant to percussion  Abdomen: Soft, nontender, nondistended, positive bowel sounds, no masses no hepatosplenomegaly noted..  Neuro: No focal neurological deficits noted cranial nerves II through XII grossly intact. DTRs 2+ bilaterally upper and lower extremities. Strength 5 out of 5 in bilateral upper and lower extremities. Musculoskeletal: No warm swelling or erythema around joints, no spinal tenderness noted. Psychiatric: Patient alert and oriented x3, good insight and cognition, good recent to remote recall. Lymph node survey: No cervical axillary or inguinal lymphadenopathy  noted.   Assessment/Plan:  Right shoulder pain: self limited resolved  Hemoglobin SS: stable at this time. Unable to obtain labs taken in [place] labs done today CBC, CMET, Retic, hemoglobin evaluation, and magnesium. Continue maintenance medication of Folic acid and hydroxyurea.      Back Pain/ this is a chronic problem. The problem occurs constantly. The problem is unchanged. The pain is present in the lumbar spine. The quality of the pain is described as aching and stabbing. The pain does not radiate. The pain is at a severity of 5/10. The pain is mild. The pain is worse during the night. The symptoms are aggravated by bending and twisting. Associated symptoms include headaches. No functional decline or changes. Continue ibuprofen 600 mg prn for inflammatory process. Adjunct therapy heating pad.   Acute on chronic pain:  No change in his oxycodone   15mg  Q 6 hrs prn prescription given today for # 90 a 22 day supply.   Gwinda Passe NP-C

## 2013-02-14 LAB — HEMOGLOBINOPATHY EVALUATION: Hgb S Quant: 59.3 % — ABNORMAL HIGH

## 2013-02-15 ENCOUNTER — Encounter: Payer: Self-pay | Admitting: Internal Medicine

## 2013-02-26 DIAGNOSIS — M79609 Pain in unspecified limb: Secondary | ICD-10-CM | POA: Diagnosis not present

## 2013-02-26 DIAGNOSIS — R1013 Epigastric pain: Secondary | ICD-10-CM | POA: Diagnosis not present

## 2013-02-26 DIAGNOSIS — D5700 Hb-SS disease with crisis, unspecified: Secondary | ICD-10-CM | POA: Diagnosis not present

## 2013-02-26 DIAGNOSIS — D57419 Sickle-cell thalassemia with crisis, unspecified: Secondary | ICD-10-CM | POA: Diagnosis not present

## 2013-02-27 ENCOUNTER — Telehealth: Payer: Self-pay | Admitting: Internal Medicine

## 2013-02-27 DIAGNOSIS — D571 Sickle-cell disease without crisis: Secondary | ICD-10-CM

## 2013-02-28 MED ORDER — OXYCODONE HCL 15 MG PO TABS: 15.0000 mg | ORAL_TABLET | Freq: Four times a day (QID) | ORAL | Status: DC | PRN

## 2013-02-28 NOTE — Telephone Encounter (Signed)
Pt requesting refill on Oxycodone 15 (1) Q 6hrs prn last written 02/08/13 # 90 will be available for pick up on 03/01/13

## 2013-03-07 DIAGNOSIS — D571 Sickle-cell disease without crisis: Secondary | ICD-10-CM | POA: Diagnosis not present

## 2013-03-12 DIAGNOSIS — Z885 Allergy status to narcotic agent status: Secondary | ICD-10-CM | POA: Diagnosis not present

## 2013-03-12 DIAGNOSIS — F17200 Nicotine dependence, unspecified, uncomplicated: Secondary | ICD-10-CM | POA: Diagnosis not present

## 2013-03-12 DIAGNOSIS — Z79899 Other long term (current) drug therapy: Secondary | ICD-10-CM | POA: Diagnosis not present

## 2013-03-12 DIAGNOSIS — D5700 Hb-SS disease with crisis, unspecified: Secondary | ICD-10-CM | POA: Diagnosis not present

## 2013-03-12 DIAGNOSIS — F209 Schizophrenia, unspecified: Secondary | ICD-10-CM | POA: Diagnosis not present

## 2013-03-13 ENCOUNTER — Ambulatory Visit: Payer: Medicare Other | Admitting: Primary Care

## 2013-03-19 DIAGNOSIS — R918 Other nonspecific abnormal finding of lung field: Secondary | ICD-10-CM | POA: Diagnosis not present

## 2013-03-19 DIAGNOSIS — R05 Cough: Secondary | ICD-10-CM | POA: Diagnosis not present

## 2013-03-19 DIAGNOSIS — R509 Fever, unspecified: Secondary | ICD-10-CM | POA: Diagnosis not present

## 2013-03-19 DIAGNOSIS — D571 Sickle-cell disease without crisis: Secondary | ICD-10-CM | POA: Diagnosis not present

## 2013-03-19 DIAGNOSIS — R059 Cough, unspecified: Secondary | ICD-10-CM | POA: Diagnosis not present

## 2013-03-20 ENCOUNTER — Ambulatory Visit: Payer: Medicare Other | Admitting: Primary Care

## 2013-03-21 ENCOUNTER — Ambulatory Visit (INDEPENDENT_AMBULATORY_CARE_PROVIDER_SITE_OTHER): Payer: Medicare Other | Admitting: Primary Care

## 2013-03-21 ENCOUNTER — Encounter: Payer: Self-pay | Admitting: Primary Care

## 2013-03-21 VITALS — BP 103/85 | HR 88 | Temp 98.3°F | Resp 14 | Ht 67.25 in | Wt 141.0 lb

## 2013-03-21 DIAGNOSIS — G894 Chronic pain syndrome: Secondary | ICD-10-CM

## 2013-03-21 DIAGNOSIS — Z23 Encounter for immunization: Secondary | ICD-10-CM

## 2013-03-21 DIAGNOSIS — D571 Sickle-cell disease without crisis: Secondary | ICD-10-CM

## 2013-03-21 MED ORDER — OXYCODONE HCL 15 MG PO TABS: 15.0000 mg | ORAL_TABLET | Freq: Four times a day (QID) | ORAL | Status: DC | PRN

## 2013-03-21 NOTE — Progress Notes (Signed)
Patient ID: Caileb Rhue, male   DOB: 1985-12-31, 27 y.o.   MRN: [id number] HPI  Mr. Macchia is a 27 year old  with SS genotype sickle cell disease PMH: migraines. He is in today for a f/u from recent crisis he was treated on 03/12/13 in [facility] ED and released. 03/19/13 he went to the [facility] ED for a productive cough he was fearful of having pneumonia. After evaluation and chest x-ray where negative he was released with a Z-pack which he is currently taking. He c/o lower back pain 5/10 base line 0/10. This is typical chronic pain. Provacative factors is over exertion and palliative factors is relief with his analgesics.   Review of Systems  Constitutional: Negative.   HENT: Negative.   Eyes: Negative.   Respiratory: Negative.   Cardiovascular: Negative.   Gastrointestinal: Negative.   Genitourinary: Negative.   Musculoskeletal: Positive for back pain.  Skin: Negative.   Neurological: Negative.   Endo/Heme/Allergies: Negative.   Psychiatric/Behavioral: Negative.      Objective:   Physical Exam  General: Alert, awake, oriented x3, in no acute distress.  HEENT: [place]/AT PEERL, EOMI  Neck: Trachea midline, no masses, no thyromegal,y no JVD, no carotid bruit  OROPHARYNX: Moist, No exudate/ erythema/lesions.  Heart: Regular rate and rhythm, without murmurs, rubs, gallops  Lungs: Clear to auscultation, no wheezing or rhonchi noted. No vocal fremitus. Abdomen: Soft, nontender, nondistended, positive bowel sounds, no masses no hepatosplenomegaly noted..  Neuro: No focal neurological deficits noted cranial nerves II through XII grossly intact Musculoskeletal: No warm swelling or erythema around joints, no spinal tenderness noted.  Psychiatric: Patient alert and oriented x3  Lymph node survey: No cervical axillary    Labs Reviewed   Assessment/Plan:     Hemoglobin SS: Recent crisis resolved. Continue maintenance medication of Folic acid and hydroxyurea.  Back Pain this continues  to be chronic problem. Their is no change in the location of his  pain it remains in his lumbar area.  The quality of the pain is described as aching and stabbing. The pain is localized to one area. The pain is rated  5/10. The pain is mild. decline or changes.  Acute on chronic pain: No change in his oxycodone  15mg  Q 6 hrs prn prescription given today for # 90 a 22 day supply.    Health Maintenance: Flu shot given  Needs referral to hematologist                    Greater than 50% of visit spent in counseling and discussion of chronic care and complications of Sickle cell disease.

## 2013-04-03 ENCOUNTER — Telehealth: Payer: Self-pay | Admitting: Internal Medicine

## 2013-04-03 DIAGNOSIS — G894 Chronic pain syndrome: Secondary | ICD-10-CM

## 2013-04-03 DIAGNOSIS — D571 Sickle-cell disease without crisis: Secondary | ICD-10-CM

## 2013-04-03 MED ORDER — OXYCODONE HCL 15 MG PO TABS: 15.0000 mg | ORAL_TABLET | Freq: Four times a day (QID) | ORAL | Status: DC | PRN

## 2013-04-03 NOTE — Telephone Encounter (Signed)
Mr. Tant requesting refill ready for pick up on or after 04/12/13 a 22  day last written 03/21/13 for Oxycodone 15 mg Q 6hrs # 90

## 2013-04-05 DIAGNOSIS — J029 Acute pharyngitis, unspecified: Secondary | ICD-10-CM | POA: Diagnosis not present

## 2013-04-05 DIAGNOSIS — M25569 Pain in unspecified knee: Secondary | ICD-10-CM | POA: Diagnosis not present

## 2013-04-05 DIAGNOSIS — M79609 Pain in unspecified limb: Secondary | ICD-10-CM | POA: Diagnosis not present

## 2013-04-05 DIAGNOSIS — D573 Sickle-cell trait: Secondary | ICD-10-CM | POA: Diagnosis not present

## 2013-04-05 DIAGNOSIS — IMO0001 Reserved for inherently not codable concepts without codable children: Secondary | ICD-10-CM | POA: Diagnosis not present

## 2013-04-05 DIAGNOSIS — F17200 Nicotine dependence, unspecified, uncomplicated: Secondary | ICD-10-CM | POA: Diagnosis not present

## 2013-04-05 DIAGNOSIS — M549 Dorsalgia, unspecified: Secondary | ICD-10-CM | POA: Diagnosis not present

## 2013-04-07 DIAGNOSIS — R059 Cough, unspecified: Secondary | ICD-10-CM | POA: Diagnosis not present

## 2013-04-07 DIAGNOSIS — R05 Cough: Secondary | ICD-10-CM | POA: Diagnosis not present

## 2013-04-07 DIAGNOSIS — D571 Sickle-cell disease without crisis: Secondary | ICD-10-CM | POA: Diagnosis not present

## 2013-04-07 DIAGNOSIS — M549 Dorsalgia, unspecified: Secondary | ICD-10-CM | POA: Diagnosis not present

## 2013-04-07 DIAGNOSIS — M542 Cervicalgia: Secondary | ICD-10-CM | POA: Diagnosis not present

## 2013-04-07 DIAGNOSIS — M545 Low back pain, unspecified: Secondary | ICD-10-CM | POA: Diagnosis not present

## 2013-04-07 DIAGNOSIS — IMO0001 Reserved for inherently not codable concepts without codable children: Secondary | ICD-10-CM | POA: Diagnosis not present

## 2013-04-28 ENCOUNTER — Telehealth: Payer: Self-pay | Admitting: *Deleted

## 2013-04-28 ENCOUNTER — Other Ambulatory Visit: Payer: Self-pay | Admitting: Internal Medicine

## 2013-04-28 DIAGNOSIS — G894 Chronic pain syndrome: Secondary | ICD-10-CM

## 2013-04-28 DIAGNOSIS — D571 Sickle-cell disease without crisis: Secondary | ICD-10-CM

## 2013-04-28 MED ORDER — OXYCODONE HCL 15 MG PO TABS: 15.0000 mg | ORAL_TABLET | Freq: Four times a day (QID) | ORAL | Status: DC | PRN

## 2013-04-28 NOTE — Progress Notes (Signed)
Pt's prescription refilled for Oxycodone 15 mg PO q 6 hrs PRN #90 dispensed. Pt needs clinic visit to evaluate treatment effectiveness. Pt also needs a random drug screen.

## 2013-04-28 NOTE — Telephone Encounter (Signed)
Refill for Oxycodone 15 mg

## 2013-04-30 DIAGNOSIS — M79609 Pain in unspecified limb: Secondary | ICD-10-CM | POA: Diagnosis not present

## 2013-04-30 DIAGNOSIS — F17200 Nicotine dependence, unspecified, uncomplicated: Secondary | ICD-10-CM | POA: Diagnosis not present

## 2013-04-30 DIAGNOSIS — M545 Low back pain, unspecified: Secondary | ICD-10-CM | POA: Diagnosis not present

## 2013-04-30 DIAGNOSIS — M549 Dorsalgia, unspecified: Secondary | ICD-10-CM | POA: Diagnosis not present

## 2013-04-30 DIAGNOSIS — D5700 Hb-SS disease with crisis, unspecified: Secondary | ICD-10-CM | POA: Diagnosis not present

## 2013-05-03 ENCOUNTER — Ambulatory Visit: Payer: Medicare Other | Admitting: Internal Medicine

## 2013-05-05 ENCOUNTER — Telehealth: Payer: Self-pay | Admitting: *Deleted

## 2013-05-05 DIAGNOSIS — Z885 Allergy status to narcotic agent status: Secondary | ICD-10-CM | POA: Diagnosis not present

## 2013-05-05 DIAGNOSIS — F209 Schizophrenia, unspecified: Secondary | ICD-10-CM | POA: Diagnosis not present

## 2013-05-05 DIAGNOSIS — D5700 Hb-SS disease with crisis, unspecified: Secondary | ICD-10-CM | POA: Diagnosis not present

## 2013-05-05 DIAGNOSIS — D571 Sickle-cell disease without crisis: Secondary | ICD-10-CM | POA: Diagnosis not present

## 2013-05-05 DIAGNOSIS — Z79899 Other long term (current) drug therapy: Secondary | ICD-10-CM | POA: Diagnosis not present

## 2013-05-05 NOTE — Telephone Encounter (Signed)
Pt call to seek medical advice about having migraine headache released from hospital on 05/03/13. Next appointment schedule for 05/09/13. Told pt to go ER to be evaluated

## 2013-05-08 DIAGNOSIS — M545 Low back pain, unspecified: Secondary | ICD-10-CM | POA: Diagnosis not present

## 2013-05-08 DIAGNOSIS — D571 Sickle-cell disease without crisis: Secondary | ICD-10-CM | POA: Diagnosis not present

## 2013-05-08 DIAGNOSIS — M549 Dorsalgia, unspecified: Secondary | ICD-10-CM | POA: Diagnosis not present

## 2013-05-08 DIAGNOSIS — IMO0001 Reserved for inherently not codable concepts without codable children: Secondary | ICD-10-CM | POA: Diagnosis not present

## 2013-05-08 DIAGNOSIS — M2550 Pain in unspecified joint: Secondary | ICD-10-CM | POA: Diagnosis not present

## 2013-05-09 ENCOUNTER — Encounter: Payer: Self-pay | Admitting: Internal Medicine

## 2013-05-09 ENCOUNTER — Ambulatory Visit (INDEPENDENT_AMBULATORY_CARE_PROVIDER_SITE_OTHER): Payer: Medicare Other | Admitting: Internal Medicine

## 2013-05-09 VITALS — BP 141/83 | HR 83 | Temp 98.5°F | Resp 16 | Ht 66.25 in | Wt 143.0 lb

## 2013-05-09 DIAGNOSIS — M25519 Pain in unspecified shoulder: Secondary | ICD-10-CM

## 2013-05-09 DIAGNOSIS — M549 Dorsalgia, unspecified: Secondary | ICD-10-CM | POA: Diagnosis not present

## 2013-05-09 DIAGNOSIS — M25511 Pain in right shoulder: Secondary | ICD-10-CM

## 2013-05-09 NOTE — Progress Notes (Signed)
  Subjective:    Patient ID: Alejandro Soto, male    DOB: 1985/07/02, 27 y.o.   MRN: [id number]  HPI: Pt states that he had a recent hospitalization at [facility]. This has been his 1st hospitalization since June. Pt believes that this was triggered by change in weather. He states that he has been more conservative with his activity and has been using meditation to help with coping with his pain.   He states that he had a migraine headache and was seen in the ED at [facility] as he was in [place] att the time.   He also states that he has chronic back pain but it has been tolerable. He rates pain as 2-3/10 and constant. There is no radiation of pain. He states that pain is made worse with certain movements which he is unable to identify, and better with stretching. He denies any trauma.    Review of Systems  Constitutional: Negative.   Eyes: Negative.   Respiratory: Negative.   Cardiovascular: Negative.   Gastrointestinal: Negative.   Endocrine: Negative.   Genitourinary: Negative.   Musculoskeletal: Positive for arthralgias and myalgias.  Skin: Negative.   Allergic/Immunologic: Negative.   Hematological: Negative.   Psychiatric/Behavioral: Negative.        Objective:   Physical Exam  Constitutional: He is oriented to person, place, and time. He appears well-developed and well-nourished.  HENT:  Head: Atraumatic.  Eyes: Conjunctivae and EOM are normal. Pupils are equal, round, and reactive to light. No scleral icterus.  Neck: Normal range of motion. Neck supple.  Cardiovascular: Normal rate and regular rhythm.  Exam reveals no gallop and no friction rub.   No murmur heard. Pulmonary/Chest: Effort normal and breath sounds normal. He has no wheezes. He has no rales. He exhibits no tenderness.  Abdominal: Soft. Bowel sounds are normal. He exhibits no mass.  Musculoskeletal: Normal range of motion.  Neurological: He is alert and oriented to person,  place, and time.  Skin: Skin is warm and dry.  Psychiatric: He has a normal mood and affect. His behavior is normal. Judgment and thought content normal.          Assessment & Plan:  1. Hb SS; Pt is doing well with control of his pain with current medications Oxycodone 15 mg q 6 hour PRN. He continues to have pain in the right shoulder. Will obtain X-ray of shoulder to evaluate for avascular necrosis. Advised use of modalities such as heat. Also advised decreased weight bearing on right shoulder.  Pt reports compliance with Hydrea and Folic Acid  2. Back Pain: Chronic: Feel that this is multimodal. Instructed patient in use of stretching exercises. Continue with ibuprofen and Oxycodone as needed and utilize heat as adjunctive therapy.  3. Migraine; resolved.  Labs: none.Will obtain recent labs from [facility]  RTC: 3 months.

## 2013-05-22 DIAGNOSIS — M2550 Pain in unspecified joint: Secondary | ICD-10-CM | POA: Diagnosis not present

## 2013-05-22 DIAGNOSIS — IMO0001 Reserved for inherently not codable concepts without codable children: Secondary | ICD-10-CM | POA: Diagnosis not present

## 2013-05-22 DIAGNOSIS — D571 Sickle-cell disease without crisis: Secondary | ICD-10-CM | POA: Diagnosis not present

## 2013-05-22 DIAGNOSIS — M549 Dorsalgia, unspecified: Secondary | ICD-10-CM | POA: Diagnosis not present

## 2013-05-22 DIAGNOSIS — M25539 Pain in unspecified wrist: Secondary | ICD-10-CM | POA: Diagnosis not present

## 2013-05-24 ENCOUNTER — Encounter: Payer: Self-pay | Admitting: Internal Medicine

## 2013-05-24 ENCOUNTER — Ambulatory Visit (INDEPENDENT_AMBULATORY_CARE_PROVIDER_SITE_OTHER): Payer: Medicare Other | Admitting: Internal Medicine

## 2013-05-24 ENCOUNTER — Non-Acute Institutional Stay (HOSPITAL_COMMUNITY)
Admission: AD | Admit: 2013-05-24 | Discharge: 2013-05-24 | Disposition: A | Discharge status: Home / Self Care | Payer: Medicare Other | Source: Ambulatory Visit | Attending: Internal Medicine | Admitting: Internal Medicine

## 2013-05-24 ENCOUNTER — Encounter (HOSPITAL_COMMUNITY): Payer: Self-pay | Admitting: Hematology

## 2013-05-24 VITALS — BP 118/68 | HR 78 | Temp 98.4°F | Resp 16 | Ht 67.25 in | Wt 144.0 lb

## 2013-05-24 DIAGNOSIS — G894 Chronic pain syndrome: Secondary | ICD-10-CM

## 2013-05-24 DIAGNOSIS — D5700 Hb-SS disease with crisis, unspecified: Secondary | ICD-10-CM

## 2013-05-24 DIAGNOSIS — F1190 Opioid use, unspecified, uncomplicated: Secondary | ICD-10-CM | POA: Insufficient documentation

## 2013-05-24 DIAGNOSIS — D571 Sickle-cell disease without crisis: Secondary | ICD-10-CM

## 2013-05-24 DIAGNOSIS — F1110 Opioid abuse, uncomplicated: Secondary | ICD-10-CM

## 2013-05-24 DIAGNOSIS — G8929 Other chronic pain: Secondary | ICD-10-CM | POA: Diagnosis not present

## 2013-05-24 MED ORDER — FOLIC ACID 1 MG PO TABS
1.0000 mg | ORAL_TABLET | Freq: Every day | ORAL | Status: DC
  Administered 2013-05-24: 1 mg via ORAL
  Filled 2013-05-24: qty 1

## 2013-05-24 MED ORDER — METHADONE HCL 5 MG PO TABS: 2.5000 mg | ORAL_TABLET | Freq: Every day | ORAL | Status: DC

## 2013-05-24 MED ORDER — HYDROMORPHONE HCL PF 2 MG/ML IJ SOLN
2.0000 mg | Freq: Once | INTRAMUSCULAR | Status: AC
  Administered 2013-05-24: 2 mg via INTRAVENOUS
  Filled 2013-05-24: qty 1

## 2013-05-24 MED ORDER — OXYCODONE HCL 15 MG PO TABS: 15.0000 mg | ORAL_TABLET | ORAL | Status: DC | PRN

## 2013-05-24 MED ORDER — HYDROXYUREA 500 MG PO CAPS
1500.0000 mg | ORAL_CAPSULE | Freq: Every day | ORAL | Status: DC
  Administered 2013-05-24: 1500 mg via ORAL
  Filled 2013-05-24: qty 3

## 2013-05-24 MED ORDER — HYDROMORPHONE HCL PF 2 MG/ML IJ SOLN
2.4000 mg | Freq: Once | INTRAMUSCULAR | Status: AC
  Administered 2013-05-24: 2.4 mg via INTRAVENOUS
  Filled 2013-05-24: qty 2

## 2013-05-24 MED ORDER — GABAPENTIN 100 MG PO CAPS
300.0000 mg | ORAL_CAPSULE | Freq: Two times a day (BID) | ORAL | Status: DC
  Administered 2013-05-24: 300 mg via ORAL
  Filled 2013-05-24: qty 3

## 2013-05-24 MED ORDER — METHADONE HCL 5 MG PO TABS: 2.5000 mg | ORAL_TABLET | Freq: Three times a day (TID) | ORAL | Status: DC

## 2013-05-24 MED ORDER — KETOROLAC TROMETHAMINE 30 MG/ML IJ SOLN
30.0000 mg | Freq: Four times a day (QID) | INTRAMUSCULAR | Status: DC
  Administered 2013-05-24: 30 mg via INTRAVENOUS
  Filled 2013-05-24: qty 1

## 2013-05-24 MED ORDER — GABAPENTIN 300 MG PO CAPS: 300.0000 mg | ORAL_CAPSULE | Freq: Two times a day (BID) | ORAL | Status: DC

## 2013-05-24 MED ORDER — HYDROMORPHONE HCL PF 2 MG/ML IJ SOLN
1.6000 mg | Freq: Once | INTRAMUSCULAR | Status: AC
  Administered 2013-05-24: 3.2 mg via INTRAVENOUS
  Filled 2013-05-24: qty 1

## 2013-05-24 MED ORDER — OXYCODONE HCL 5 MG PO TABS
15.0000 mg | ORAL_TABLET | Freq: Once | ORAL | Status: AC
  Administered 2013-05-24: 15 mg via ORAL
  Filled 2013-05-24: qty 3

## 2013-05-24 MED ORDER — DEXTROSE-NACL 5-0.45 % IV SOLN
INTRAVENOUS | Status: DC
  Administered 2013-05-24: 11:00:00 via INTRAVENOUS

## 2013-05-24 MED ORDER — FOLIC ACID 1 MG PO TABS: 1.0000 mg | ORAL_TABLET | Freq: Every day | ORAL | Status: DC

## 2013-05-24 NOTE — H&P (Signed)
[facility] History and Physical  Alejandro Soto ZOX:[id number] DOB: February 12, 1986 DOA: 05/24/2013   PCP: Lorree Millar A., MD   Chief Complaint: Pain 7/10 in back x 3 days  HPI:Pt presented for acute visit for acute pain of sickle cell. He localizes pain to low back and RUE. The pain is at an intensity of 7/10, non radiating and he is unable to identify any palliative or provocative features but feels that the cold weather is contributory.  He was seen in the ED at [facility] 2 days ago and treated with IV Toradol. He states that he has used his Oxycodone every 4 hours as has been directed when he has an increase in pain. He states that this was not effective and thus he is here today for further evaluation and treatment. Pt denies any N/V, Fever or chills.    Review of Systems:  Constitutional: No weight loss, night sweats, Fevers, chills, fatigue.  HEENT: No headaches, dizziness, seizures, vision changes, difficulty swallowing,Tooth/dental problems,Sore throat, No sneezing, itching, ear ache, nasal congestion, post nasal drip,  Cardio-vascular: No chest pain, Orthopnea, PND, swelling in lower extremities, anasarca, dizziness, palpitations  GI: No heartburn, indigestion, abdominal pain, nausea, vomiting, diarrhea, change in bowel habits, loss of appetite  Resp: No shortness of breath with exertion or at rest. No excess mucus, no productive cough, No non-productive cough, No coughing up of blood.No change in color of mucus.No wheezing.No chest wall deformity  Skin: no rash or lesions.  GU: no dysuria, change in color of urine, no urgency or frequency. No flank pain.   Psych: No change in mood or affect. No depression or anxiety. No memory loss.    Past Medical History  Diagnosis Date  . Sickle cell anemia    No past surgical history on file. Social History:  reports that he has been smoking Cigarettes.  He has been smoking about 0.00 packs per day for the  past 5 years. He does not have any smokeless tobacco history on file. He reports that he drinks alcohol. He reports that he does not use illicit drugs.  Allergies  Allergen Reactions  . Amoxicillin Diarrhea  . Morphine And Related Other (See Comments)    Made his stomach hurt    Family History  Problem Relation Age of Onset  . Diabetes Father   . Cancer Father 54    Prostate  . Asthma Brother   . Cancer Maternal Grandmother     colon     Prior to Admission medications   Medication Sig Start Date End Date Taking? Authorizing Provider  cholecalciferol (VITAMIN D) 1000 UNITS tablet Take 1,000 Units by mouth daily.   Yes Historical Provider, MD  folic acid (FOLVITE) 1 MG tablet Take 1 mg by mouth daily.   Yes Historical Provider, MD  gabapentin (NEURONTIN) 300 MG capsule Take 300 mg by mouth as needed.   Yes Historical Provider, MD  hydroxyurea (HYDREA) 500 MG capsule Take 1,500 mg by mouth daily. May take with food to minimize GI side effects.   Yes Historical Provider, MD  ibuprofen (ADVIL,MOTRIN) 600 MG tablet Take 600 mg by mouth as needed for pain.   Yes Historical Provider, MD  Multiple Vitamin (MULTIVITAMIN) tablet Take 1 tablet by mouth daily.   Yes Historical Provider, MD  oxyCODONE (ROXICODONE) 15 MG immediate release tablet Take 1 tablet (15 mg total) by mouth every 6 (six) hours as needed for pain. 04/28/13  Yes Altha Harm, MD  vitamin C (ASCORBIC ACID) 500 MG tablet Take 500 mg by mouth daily.   Yes Historical Provider, MD   Physical Exam: Filed Vitals:   05/24/13 1216  BP: 115/63  Pulse: 88  Temp: 98.4 F (36.9 C)  TempSrc: Oral  Resp: 18  SpO2: 99%   BP 115/63  Pulse 88  Temp(Src) 98.4 F (36.9 C) (Oral)  Resp 18  SpO2 99%  General Appearance:    Alert, cooperative, no distress, appears stated age  Head:    Normocephalic, without obvious abnormality, atraumatic  Eyes:    PERRL, conjunctiva/corneas clear, EOM's intact, fundi    benign, both eyes  anicteric      Throat:   Lips, mucosa, and tongue normal; teeth and gums normal  Neck:   Supple, symmetrical, trachea midline, no adenopathy;       thyroid:  No enlargement/tenderness/nodules; no carotid   bruit or JVD  Back:     Symmetric, no curvature, ROM normal, no CVA tenderness  Lungs:     Clear to auscultation bilaterally, respirations unlabored  Chest wall:    No tenderness or deformity  Heart:    Regular rate and rhythm, S1 and S2 normal, no murmur, rub   or gallop  Abdomen:     Soft, non-tender, bowel sounds active all four quadrants,    no masses, no organomegaly  Extremities:   Extremities normal, atraumatic, no cyanosis or edema  Pulses:   2+ and symmetric all extremities  Skin:   Skin color, texture, turgor normal, no rashes or lesions  Lymph nodes:   Cervical, supraclavicular, and axillary nodes normal  Neurologic:   CNII-XII intact. Normal strength, sensation and reflexes      throughout    Labs on Admission:  Labs reviewed from [facility] and scanned into chart  Assessment/Plan: Active Problems: 1. Hb SS with crisis: Pt is having uncontrolled pain with his current pain regimen. Will treat acute pain of SCD with initial weight based rapid re-dosing of Dilaudid, IVF, and Toradol. Will re-evaluate after 3 doses and make a decision about discharge with oral medications vs continued treatment with IV analgesics.  2. Chronic pain: Will increase frequency of  to every 4 hours of short acting medications and add Methadone 2.5 mg q HS for the winter months and wean during warmer months.   Time spend: 40 minutes Code Status: Full Code Family Communication: N/A Disposition Plan: Home at end of treatment  Blen Ransome A., MD  Pager [phone]  If 7PM-7AM, please contact night-coverage [email or web address] Password [email or web address] 05/24/2013, 12:30 PM

## 2013-05-24 NOTE — Progress Notes (Signed)
Patient ID: Marke Goodwyn, male   DOB: 22-May-1986, 27 y.o.   MRN: [id number] Nurse notified me of dose of Hydromorphone administered. Pt awake alert and stable VSS. Pain still at 7/10. Ok to give second dose as ordered.

## 2013-05-24 NOTE — Progress Notes (Signed)
Realized incorrect dose of hydromorphone given.  Order was 0.2ml=1.6mg .  Actually gave 1.24mls=3.2mg  at 11:29 administrations.  Patient is awake, alert & oriented x4. Up to bathroom.  Vital signs WNL. Pain still at 7/10 in back.  MD notified of this administration, and that the next ordered dose given correctly at 12:03.  RN will continue to monitor the patient.  Safety zone completed, waste corrected in pyxsis.

## 2013-05-24 NOTE — Progress Notes (Signed)
   Subjective:    Patient ID: Alejandro Soto, male    DOB: 1986-05-28, 27 y.o.   MRN: [id number]  HPI: Pt presents today to clinic with c/o pain in low back. He describes pain as throbbing and non-radiating at a level of 7/10. Pt usually has a baseline intensity of 2-3/10 which is easily controlled with oral analgesics. He was seen in the ED at  [facility] on Monday (2 days ago) and treated with Toradol.   He has his labs from that visit and they are reviewed: WBC- 8.8, Hb-11.2, Hct-31.0, Plt-588, Retic-6.1% Na-138, K-3.7-, Cl-102, CO2-27, BUN-11, Cr-0.68.  Pt also reports that his usual regimen of Oxycodone 15 mg q 6 hours PRN has been ineffective and he has had to increase to q 4 hours recently. I have discussed wiuth patient that self-adjustment of his medications is prohibited and that he is to call and discuss with Physician before any further adjustment of medications.    Review of Systems  Constitutional: Negative.   HENT: Negative.   Eyes: Negative.   Respiratory: Negative.   Cardiovascular: Negative.   Gastrointestinal: Negative.   Endocrine: Negative.   Genitourinary: Positive for scrotal swelling.  Musculoskeletal: Positive for arthralgias and myalgias.  Skin: Negative.   Allergic/Immunologic: Negative.   Neurological: Negative.   Hematological: Negative.   Psychiatric/Behavioral: Negative.   .     Objective:   Physical Exam  Constitutional: He is oriented to person, place, and time. He appears well-developed and well-nourished. He appears distressed (mild distress secondary to pain).  HENT:  Head: Atraumatic.  Eyes: Conjunctivae and EOM are normal. Pupils are equal, round, and reactive to light. No scleral icterus.  Neck: Normal range of motion. Neck supple.  Cardiovascular: Normal rate and regular rhythm.  Exam reveals no gallop and no friction rub.   No murmur heard. Pulmonary/Chest: Effort normal and breath sounds normal. He has no wheezes. He has no rales. He exhibits  no tenderness.  Abdominal: Soft. Bowel sounds are normal. He exhibits no mass.  Musculoskeletal: Normal range of motion.  Neurological: He is alert and oriented to person, place, and time.  Skin: Skin is warm and dry.  Psychiatric: He has a normal mood and affect. His behavior is normal. Judgment and thought content normal.          Assessment & Plan:  1. Hb SS without crisis: Pt went to ED in [facility] on Monday because it was convenient for him to go to [facility]. Pt was treated with Toradol. He states that his pain is currently 7/10 and localized to his back. Will send to [facility] for treatment.  2. Chronic Pain: Pt states that pain has been more frequent in the last month with the changing temperature. Pt is having

## 2013-05-24 NOTE — Progress Notes (Signed)
Patient ID: Alejandro Soto, male   DOB: May 13, 1986, 27 y.o.   MRN: [id number] Discharge instructions given to patient, IV removed without difficulty, prescription for oxycodone IR 15mg  given to patient, questions answered.

## 2013-05-26 LAB — OPIATES/OPIOIDS (LC/MS-MS)
Heroin (6-AM), UR: NEGATIVE ng/mL
Hydrocodone: NEGATIVE ng/mL
Hydromorphone: 936 ng/mL — AB
Morphine Urine: NEGATIVE ng/mL
Norhydrocodone, Ur: NEGATIVE ng/mL
Noroxycodone, Ur: NEGATIVE ng/mL
Oxymorphone: NEGATIVE ng/mL

## 2013-05-26 LAB — BUPRENORPHINES (GC/LC/MS), URINE: Norbuprenorphine: 41 ng/mL — AB

## 2013-05-27 LAB — PRESCRIPTION MONITORING PROFILE (SOLSTAS)
Barbiturate Screen, Urine: NEGATIVE ng/mL
Cannabinoid Scrn, Ur: NEGATIVE ng/mL
Carisoprodol, Urine: NEGATIVE ng/mL
Cocaine Metabolites: NEGATIVE ng/mL
Creatinine, Urine: 130.21 mg/dL (ref >20.0)
MDMA URINE: NEGATIVE ng/mL
Meperidine, Ur: NEGATIVE ng/mL
Zolpidem, Urine: NEGATIVE ng/mL

## 2013-06-07 ENCOUNTER — Ambulatory Visit: Payer: Medicare Other | Admitting: *Deleted

## 2013-06-07 ENCOUNTER — Other Ambulatory Visit: Payer: Self-pay | Admitting: Internal Medicine

## 2013-06-07 ENCOUNTER — Encounter: Payer: Self-pay | Admitting: *Deleted

## 2013-06-07 VITALS — BP 116/71 | HR 73 | Temp 98.3°F | Resp 18

## 2013-06-07 DIAGNOSIS — G894 Chronic pain syndrome: Secondary | ICD-10-CM

## 2013-06-07 DIAGNOSIS — T50905A Adverse effect of unspecified drugs, medicaments and biological substances, initial encounter: Secondary | ICD-10-CM

## 2013-06-07 DIAGNOSIS — D571 Sickle-cell disease without crisis: Secondary | ICD-10-CM

## 2013-06-07 MED ORDER — OXYCODONE HCL 15 MG PO TABS: 15.0000 mg | ORAL_TABLET | ORAL | Status: DC | PRN

## 2013-06-07 NOTE — Progress Notes (Addendum)
Patient in for nurses visit today for EKG per methadone management guidelines. EKG tolerated well. EKG approved by MD. Patient states during nurses visit that he is able to manage his pain at home and that he is out of his pain medication. Patient states he thought he was coming in today for office visit with the doctor and was going to request prescription refill at that time. Informed patient that this visit today is a nurses visit for the EKG test and that he does not have an appointment to see the doctor today. Patient acknowledges. Informed patient that I will notify the MD. MD notified. Prescriptions given to patient.

## 2013-06-13 DIAGNOSIS — Z79899 Other long term (current) drug therapy: Secondary | ICD-10-CM | POA: Diagnosis not present

## 2013-06-13 DIAGNOSIS — R918 Other nonspecific abnormal finding of lung field: Secondary | ICD-10-CM | POA: Diagnosis not present

## 2013-06-13 DIAGNOSIS — F209 Schizophrenia, unspecified: Secondary | ICD-10-CM | POA: Diagnosis not present

## 2013-06-13 DIAGNOSIS — Z885 Allergy status to narcotic agent status: Secondary | ICD-10-CM | POA: Diagnosis not present

## 2013-06-13 DIAGNOSIS — D571 Sickle-cell disease without crisis: Secondary | ICD-10-CM | POA: Diagnosis not present

## 2013-06-13 DIAGNOSIS — D5700 Hb-SS disease with crisis, unspecified: Secondary | ICD-10-CM | POA: Diagnosis not present

## 2013-06-14 DIAGNOSIS — Z885 Allergy status to narcotic agent status: Secondary | ICD-10-CM | POA: Diagnosis not present

## 2013-06-14 DIAGNOSIS — Z79899 Other long term (current) drug therapy: Secondary | ICD-10-CM | POA: Diagnosis not present

## 2013-06-14 DIAGNOSIS — F209 Schizophrenia, unspecified: Secondary | ICD-10-CM | POA: Diagnosis not present

## 2013-06-14 DIAGNOSIS — D5700 Hb-SS disease with crisis, unspecified: Secondary | ICD-10-CM | POA: Diagnosis not present

## 2013-06-19 ENCOUNTER — Other Ambulatory Visit: Payer: Self-pay | Admitting: Internal Medicine

## 2013-06-19 ENCOUNTER — Telehealth: Payer: Self-pay | Admitting: Hematology

## 2013-06-19 DIAGNOSIS — G894 Chronic pain syndrome: Secondary | ICD-10-CM

## 2013-06-19 DIAGNOSIS — D571 Sickle-cell disease without crisis: Secondary | ICD-10-CM

## 2013-06-19 MED ORDER — OXYCODONE HCL 15 MG PO TABS: 15.0000 mg | ORAL_TABLET | ORAL | Status: DC | PRN

## 2013-06-19 NOTE — Telephone Encounter (Signed)
Spoke with patient and advised that prescription for oxycodone will be ready for pick up on 06-20-2013 between 9-4:30

## 2013-06-19 NOTE — Progress Notes (Unsigned)
Prescription filled for Oxycodone 15 mg #90 tabs.

## 2013-06-19 NOTE — Telephone Encounter (Signed)
Oxycodone 15 mg 

## 2013-06-26 NOTE — Discharge Summary (Signed)
Physician Discharge Summary  Alejandro Soto ONG:[id number] DOB: [birth year] DOA: 05/24/2013  PCP: Alejandro Habig A., MD  Admit date: 05/24/2013 Discharge date: 05/24/2013  Discharge Diagnoses:  Active Problems: Hb SS with pain   Discharge Condition: Good  Disposition:  Follow-up Information   Follow up with Alejandro Sasaki A., MD In 2 weeks. (EKG )    Specialty:  Internal Medicine   Contact information:   [place] [address] [phone]       Follow up with Alejandro Wickes A., MD. (As needed if symptoms worsen)    Specialty:  Internal Medicine   Contact information:   [place] [address] [phone]       Diet:Regular  Wt Readings from Last 3 Encounters:  05/24/13 144 lb (65.318 kg)  05/09/13 143 lb (64.864 kg)  03/21/13 141 lb (63.957 kg)    History of present illness:  Pt presented for acute visit for acute pain of sickle cell. He localizes pain to low back and RUE. The pain is at an intensity of 7/10, non radiating and he is unable to identify any palliative or provocative features but feels that the cold weather is contributory. He was seen in the ED at [facility] 2 days ago and treated with IV Toradol. He states that he has used his Oxycodone every 4 hours as has been directed when he has an increase in pain. He states that this was not effective and thus he is here today for further evaluation and treatment. Pt denies any N/V, Fever or chills.    Hospital Course:  Pt was admitted for treatment of simple acute pain of Sickle Cell disease. He was treated with IV Toradol, IVF and  rapid re-dosing of IV analgesdics for 3 doses. He had a significant reduction in pain and was subsequently given a dose of oral analgesics. Pain control was maintained with the oral analgesic and pt was discharged home in good condition  Discharge Exam: Filed Vitals:   05/24/13 1316  BP: 110/60  Pulse: 78  Temp: 98.3  F (36.8 C)  Resp: 18   Filed Vitals:   05/24/13 1216 05/24/13 1316  BP: 115/63 110/60  Pulse: 88 78  Temp: 98.4 F (36.9 C) 98.3 F (36.8 C)  TempSrc: Oral Oral  Resp: 18 18  SpO2: 99% 99%  General: Alert, awake, oriented x3, in NAD.  HEENT: [place]/AT PEERL, EOMI, anicteric  Neck: Trachea midline, no masses, no thyromegal,y no JVD, no carotid bruit  OROPHARYNX: Moist, No exudate/ erythema/lesions.  Heart: Regular rate and rhythm, without murmurs, rubs, gallops.  Lungs: Clear to auscultation, no wheezing or rhonchi noted. No increased vocal fremitus resonant to percussion  Abdomen: Soft, nontender, nondistended, positive bowel sounds, no masses no hepatosplenomegaly noted.  Neuro: No focal neurological deficits noted cranial nerves II through XII grossly intact. DTRs 2+ bilaterally upper and lower extremities. Strength normal in bilateral upper and lower extremities.  Musculoskeletal: No warm swelling or erythema around joints, no spinal tenderness noted.  Psychiatric: Patient alert and oriented x3, good insight and cognition, good recent to remote recall.  Lymph node survey: No cervical axillary or inguinal lymphadenopathy noted.  Discharge Instructions  Discharge Orders   Future Orders Complete By Expires   Activity as tolerated - No restrictions  As directed    Diet general  As directed        Medication List         cholecalciferol 1000 UNITS  tablet  Commonly known as:  VITAMIN D  Take 1,000 Units by mouth daily.     folic acid 1 MG tablet  Commonly known as:  FOLVITE  Take 1 mg by mouth daily.     gabapentin 300 MG capsule  Commonly known as:  NEURONTIN  Take 1 capsule (300 mg total) by mouth 2 (two) times daily.     hydroxyurea 500 MG capsule  Commonly known as:  HYDREA  Take 1,500 mg by mouth daily. May take with food to minimize GI side effects.     ibuprofen 600 MG tablet  Commonly known as:  ADVIL,MOTRIN  Take 600 mg by mouth as needed for pain.      methadone 5 MG tablet  Commonly known as:  DOLOPHINE  Take 0.5 tablets (2.5 mg total) by mouth every 8 (eight) hours.     multivitamin tablet  Take 1 tablet by mouth daily.     vitamin C 500 MG tablet  Commonly known as:  ASCORBIC ACID  Take 500 mg by mouth daily.          The results of significant diagnostics from this hospitalization (including imaging, microbiology, ancillary and laboratory) are listed below for reference.     Time coordinating discharge: 39 minutes  Signed:  Nick Armel A.  05/26/2013, 3:38 PM

## 2013-06-30 ENCOUNTER — Telehealth: Payer: Self-pay | Admitting: Internal Medicine

## 2013-06-30 DIAGNOSIS — D571 Sickle-cell disease without crisis: Secondary | ICD-10-CM

## 2013-06-30 DIAGNOSIS — G894 Chronic pain syndrome: Secondary | ICD-10-CM

## 2013-06-30 NOTE — Telephone Encounter (Signed)
Refill oxyCODONE (ROXICODONE) 15 MG immediate release tablet

## 2013-07-01 DIAGNOSIS — M549 Dorsalgia, unspecified: Secondary | ICD-10-CM | POA: Diagnosis not present

## 2013-07-01 DIAGNOSIS — Z79899 Other long term (current) drug therapy: Secondary | ICD-10-CM | POA: Diagnosis not present

## 2013-07-01 DIAGNOSIS — M545 Low back pain, unspecified: Secondary | ICD-10-CM | POA: Diagnosis not present

## 2013-07-01 DIAGNOSIS — R197 Diarrhea, unspecified: Secondary | ICD-10-CM | POA: Diagnosis not present

## 2013-07-01 DIAGNOSIS — D571 Sickle-cell disease without crisis: Secondary | ICD-10-CM | POA: Diagnosis not present

## 2013-07-01 MED ORDER — OXYCODONE HCL 15 MG PO TABS: 15.0000 mg | ORAL_TABLET | ORAL | Status: DC | PRN

## 2013-07-01 NOTE — Telephone Encounter (Signed)
Prescription filled for 15 day supply of Oxycodone 15 mg #90 pills to be taken every 4 hours as needed. Last prescription issued on 06/19/2013.

## 2013-07-09 DIAGNOSIS — R059 Cough, unspecified: Secondary | ICD-10-CM | POA: Diagnosis not present

## 2013-07-09 DIAGNOSIS — Z79899 Other long term (current) drug therapy: Secondary | ICD-10-CM | POA: Diagnosis not present

## 2013-07-09 DIAGNOSIS — D5700 Hb-SS disease with crisis, unspecified: Secondary | ICD-10-CM | POA: Diagnosis not present

## 2013-07-09 DIAGNOSIS — F209 Schizophrenia, unspecified: Secondary | ICD-10-CM | POA: Diagnosis not present

## 2013-07-09 DIAGNOSIS — Z885 Allergy status to narcotic agent status: Secondary | ICD-10-CM | POA: Diagnosis not present

## 2013-07-09 DIAGNOSIS — R0989 Other specified symptoms and signs involving the circulatory and respiratory systems: Secondary | ICD-10-CM | POA: Diagnosis not present

## 2013-07-09 DIAGNOSIS — R05 Cough: Secondary | ICD-10-CM | POA: Diagnosis not present

## 2013-07-10 DIAGNOSIS — R0989 Other specified symptoms and signs involving the circulatory and respiratory systems: Secondary | ICD-10-CM | POA: Diagnosis not present

## 2013-07-13 ENCOUNTER — Telehealth: Payer: Self-pay | Admitting: Internal Medicine

## 2013-07-13 DIAGNOSIS — D5700 Hb-SS disease with crisis, unspecified: Secondary | ICD-10-CM

## 2013-07-13 DIAGNOSIS — G894 Chronic pain syndrome: Secondary | ICD-10-CM

## 2013-07-13 DIAGNOSIS — D571 Sickle-cell disease without crisis: Secondary | ICD-10-CM

## 2013-07-13 NOTE — Telephone Encounter (Signed)
Refill of oxyCODONE (ROXICODONE) 15 MG,methadone (DOLOPHINE) 5 MG

## 2013-07-17 ENCOUNTER — Telehealth: Payer: Self-pay | Admitting: *Deleted

## 2013-07-17 DIAGNOSIS — Z885 Allergy status to narcotic agent status: Secondary | ICD-10-CM | POA: Diagnosis not present

## 2013-07-17 DIAGNOSIS — R6889 Other general symptoms and signs: Secondary | ICD-10-CM | POA: Diagnosis not present

## 2013-07-17 DIAGNOSIS — F209 Schizophrenia, unspecified: Secondary | ICD-10-CM | POA: Diagnosis not present

## 2013-07-17 DIAGNOSIS — G8929 Other chronic pain: Secondary | ICD-10-CM | POA: Diagnosis not present

## 2013-07-17 DIAGNOSIS — R0602 Shortness of breath: Secondary | ICD-10-CM | POA: Diagnosis not present

## 2013-07-17 DIAGNOSIS — F17200 Nicotine dependence, unspecified, uncomplicated: Secondary | ICD-10-CM | POA: Diagnosis not present

## 2013-07-17 DIAGNOSIS — D5700 Hb-SS disease with crisis, unspecified: Secondary | ICD-10-CM | POA: Diagnosis not present

## 2013-07-17 DIAGNOSIS — D571 Sickle-cell disease without crisis: Secondary | ICD-10-CM | POA: Diagnosis not present

## 2013-07-17 DIAGNOSIS — Z79899 Other long term (current) drug therapy: Secondary | ICD-10-CM | POA: Diagnosis not present

## 2013-07-17 MED ORDER — METHADONE HCL 5 MG PO TABS: 5.0000 mg | ORAL_TABLET | Freq: Every day | ORAL | Status: DC

## 2013-07-17 MED ORDER — OXYCODONE HCL 15 MG PO TABS: 15.0000 mg | ORAL_TABLET | ORAL | Status: DC | PRN

## 2013-07-17 NOTE — Telephone Encounter (Addendum)
Pt states he went to [facility] ED for SOB and pain and requested visit notes be faxed over to [facility]. No fax received. Next appointment on 07/27/13 @9 :00 am

## 2013-07-17 NOTE — Telephone Encounter (Signed)
Pt needs appointment within next 1-2 weeks, before he receives next set of prescriptions.

## 2013-07-17 NOTE — Telephone Encounter (Signed)
Prescription issued to Alejandro Soto for Oxycodone 15 mg #90 to be used q 4 hours PRN pain and Methadone 5 mg  #30 to be taken Q HS. Pt needs appointment to discuss prescription drug screen.

## 2013-07-19 NOTE — Telephone Encounter (Signed)
Patient has a future appointment scheduled on 07/27/2013.

## 2013-07-27 ENCOUNTER — Ambulatory Visit (INDEPENDENT_AMBULATORY_CARE_PROVIDER_SITE_OTHER): Payer: Medicare Other | Admitting: Internal Medicine

## 2013-07-27 VITALS — BP 118/82 | HR 83 | Temp 98.4°F | Resp 18 | Ht 67.25 in | Wt 146.0 lb

## 2013-07-27 DIAGNOSIS — E559 Vitamin D deficiency, unspecified: Secondary | ICD-10-CM | POA: Diagnosis not present

## 2013-07-27 DIAGNOSIS — D571 Sickle-cell disease without crisis: Secondary | ICD-10-CM | POA: Diagnosis not present

## 2013-07-27 DIAGNOSIS — Z0000 Encounter for general adult medical examination without abnormal findings: Secondary | ICD-10-CM

## 2013-07-27 DIAGNOSIS — G894 Chronic pain syndrome: Secondary | ICD-10-CM

## 2013-07-27 MED ORDER — OXYCODONE HCL 15 MG PO TABS: 15.0000 mg | ORAL_TABLET | ORAL | Status: DC | PRN

## 2013-07-27 NOTE — Progress Notes (Signed)
Subjective:    Patient ID: Alejandro Soto, male    DOB: 02-16-1986, 28 y.o.   MRN: [id number]  HPI: Pt presents today with c/o sore throat which started a few days ago. He denies cough, fevers, chills, nausea, vomiting or diarrhea. He has not had any difficulty swallowing and has had a normal appetite.  He states that his pain has been well controlled with current regimen of pain medications. He is still smoking tobacco and we have discussed the risk and particularly the hypoxemia nd up-regulation of bone marrow leading to increased production of Hb S.    Review of Systems  Constitutional: Negative.   HENT: Negative.   Eyes: Negative.   Respiratory: Negative.   Cardiovascular: Negative.   Gastrointestinal: Negative.   Endocrine: Negative.   Genitourinary: Negative.   Musculoskeletal: Positive for myalgias.  Skin: Negative.   Allergic/Immunologic: Negative.   Neurological: Negative.   Hematological: Negative.   Psychiatric/Behavioral: Negative.        Objective:   Physical Exam  Constitutional: He is oriented to person, place, and time. He appears well-developed and well-nourished.  HENT:  Head: Atraumatic.  Eyes: Conjunctivae and EOM are normal. Pupils are equal, round, and reactive to light. No scleral icterus.  Neck: Normal range of motion. Neck supple.  Cardiovascular: Normal rate and regular rhythm.  Exam reveals no gallop and no friction rub.   No murmur heard. Pulmonary/Chest: Effort normal and breath sounds normal. He has no wheezes. He has no rales. He exhibits no tenderness.  Abdominal: Soft. Bowel sounds are normal. He exhibits no mass.  Musculoskeletal: Normal range of motion.  Neurological: He is alert and oriented to person, place, and time.  Skin: Skin is warm and dry.  Psychiatric: He has a normal mood and affect. His behavior is normal. Judgment and thought content normal.          Assessment & Plan:  1. Sickle cell disease Hb SS - Continue Hydrea  1,500 mg  daily. We discussed the need for good hydration, monitoring of hydration status, avoidance of heat, cold, stress, and infection triggers. We discussed the risks and benefits of Hydrea, including bone marrow suppression, the possibility of GI upset, skin ulcers, hair thinning, and teratogenicity. The patient was reminded of the need to seek medical attention of any symptoms of bleeding, anemia, or infection. Continue folic acid 1 mg daily to prevent aplastic bone marrow crises.   2. Pulmonary evaluation - Patient denies severe recurrent wheezes, shortness of breath with exercise, or persistent cough. If these symptoms develop, pulmonary function tests with spirometry will be ordered, and if abnormal, plan on referral to Pulmonology for further evaluation.  3. Cardiac - Routine screening for pulmonary hypertension is not recommended.  4. Eye - High risk of proliferative retinopathy. Annual eye exam with retinal exam recommended to patient.  5. Immunization status - She declines vaccines today. Yearly influenza vaccination is recommended, as well as being up to date with Meningococcal and Pneumococcal vaccines.   6. Acute and chronic painful episodes -  Pt is currently using Oxycodone 15 mg 180 pills per month and Methadone 5 mg 30 pills per month We discussed that he is to receive her Schedule II prescriptions only from [place]. He is also aware that her prescription history is available to [place] online through the [place]. Controlled substance agreement signed (Date). We reminded (Pt) that all patients receiving Schedule II narcotics must be seen for follow up every three months. We reviewed the  terms of our pain agreement, including the need to keep medicines in a safe locked location away from children or pets, and the need to report excess sedation or constipation, measures to avoid constipation, and policies related to early refills and stolen prescriptions. According to the [place] [facility]  program, we have reviewed details related to analgesia, adverse effects, aberrant behaviors. Last prescription drug screen showed Buprenorphine and Hydromorphone. Neither of which were prescribed for the patient by myself. We verified that patient had recently been [facility] and received a prescription for Dilaudid. I will repeat screen today.  7. Vitamin D deficiency -Pt taking OTC vitamin D 1000  units daily.  8. Nutrition: Pt has been drinking lots of sodas and juices. I have impressed upon him hte importance of decreasing his sugar intake and increasing water. Also discussed vitimin requirements an Designer, television/film set diet. Gave patient [employer] Plate guide.  9. Hematology Bi-annual Visit: Pt has been followed at [facility]. He will call to schedule his next visit. He has not been seen in the last year.   The above recommendations are taken from the NIH Evidence-Based Management of Sickle Cell Disease: Expert Panel Report, 20149.   10. Sore Throat: Pt c/o sore throat however examination reveals normal anatomy of posterior oro-pharynx. Pt is afebrile and has no difficulty with swallowing. Advised to gargle with warm salt water and call the office if any fevers.  11. Tobacco use disorder: Pt will start cutting down and his target date foir Chantix is 08/10/2013. He will call on 08/10/2013 and Chantix will be started at that time.

## 2013-07-28 ENCOUNTER — Telehealth (HOSPITAL_COMMUNITY): Payer: Self-pay | Admitting: *Deleted

## 2013-07-28 DIAGNOSIS — Z885 Allergy status to narcotic agent status: Secondary | ICD-10-CM | POA: Diagnosis not present

## 2013-07-28 DIAGNOSIS — D571 Sickle-cell disease without crisis: Secondary | ICD-10-CM | POA: Diagnosis not present

## 2013-07-28 DIAGNOSIS — G8929 Other chronic pain: Secondary | ICD-10-CM | POA: Diagnosis not present

## 2013-07-28 DIAGNOSIS — Z791 Long term (current) use of non-steroidal anti-inflammatories (NSAID): Secondary | ICD-10-CM | POA: Diagnosis not present

## 2013-07-28 DIAGNOSIS — M549 Dorsalgia, unspecified: Secondary | ICD-10-CM | POA: Diagnosis not present

## 2013-07-28 DIAGNOSIS — D5700 Hb-SS disease with crisis, unspecified: Secondary | ICD-10-CM | POA: Diagnosis not present

## 2013-07-28 NOTE — Telephone Encounter (Signed)
Returned patient call. Instructed patient to come to [facility]. Patient states he is unable to make it to day hospital today because he is in [place], [place]. Patient states he was told to call his doctor first when having pain. Instructed patient that if unable to manage pain with home interventions then he needs to go to ED, if condition changes or worsens then he needs to go to ED. Instructed patient to call back Monday if needed. Patient acknowledges.

## 2013-07-28 NOTE — Telephone Encounter (Signed)
Received patient call, patient c/o back and leg pain since last night, rating pain 9/10. Patient reports taking pain medication this morning and it was ineffective. Patient reports hot flashes, unknown if having fever. Patient denies all other pertinent ROS per [facility] Triage Assessment. Informed patient that this RN will notify MD on call and patient is to receive a return call back with recommendations. Patient acknowledges.

## 2013-07-29 DIAGNOSIS — D5700 Hb-SS disease with crisis, unspecified: Secondary | ICD-10-CM | POA: Diagnosis not present

## 2013-07-29 DIAGNOSIS — Z791 Long term (current) use of non-steroidal anti-inflammatories (NSAID): Secondary | ICD-10-CM | POA: Diagnosis not present

## 2013-07-29 DIAGNOSIS — M549 Dorsalgia, unspecified: Secondary | ICD-10-CM | POA: Diagnosis not present

## 2013-07-29 DIAGNOSIS — G8929 Other chronic pain: Secondary | ICD-10-CM | POA: Diagnosis not present

## 2013-07-30 DIAGNOSIS — D5700 Hb-SS disease with crisis, unspecified: Secondary | ICD-10-CM | POA: Diagnosis not present

## 2013-07-30 DIAGNOSIS — Z791 Long term (current) use of non-steroidal anti-inflammatories (NSAID): Secondary | ICD-10-CM | POA: Diagnosis not present

## 2013-07-30 DIAGNOSIS — M549 Dorsalgia, unspecified: Secondary | ICD-10-CM | POA: Diagnosis not present

## 2013-07-30 DIAGNOSIS — G8929 Other chronic pain: Secondary | ICD-10-CM | POA: Diagnosis not present

## 2013-07-31 LAB — METHADONE (GC/LC/MS), URINE
EDDP (GC/LC/MS), ur confirm: 1043 ng/mL — AB
Methadone (GC/LC/MS), ur confirm: 738 ng/mL — AB

## 2013-07-31 LAB — OPIATES/OPIOIDS (LC/MS-MS)
Codeine Urine: NEGATIVE ng/mL
HYDROCODONE: NEGATIVE ng/mL
HYDROMORPHONE: NEGATIVE ng/mL
Heroin (6-AM), UR: NEGATIVE ng/mL
MORPHINE: NEGATIVE ng/mL
NORHYDROCODONE, UR: NEGATIVE ng/mL
Noroxycodone, Ur: >2500 ng/mL — AB
Oxycodone, ur: 1004 ng/mL — AB
Oxymorphone: 1499 ng/mL — AB

## 2013-07-31 LAB — PRESCRIPTION MONITORING PROFILE (SOLSTAS)
AMPHETAMINE/METH: NEGATIVE ng/mL
BENZODIAZEPINE SCREEN, URINE: NEGATIVE ng/mL
BUPRENORPHINE, URINE: NEGATIVE ng/mL
Barbiturate Screen, Urine: NEGATIVE ng/mL
Cannabinoid Scrn, Ur: NEGATIVE ng/mL
Carisoprodol, Urine: NEGATIVE ng/mL
Cocaine Metabolites: NEGATIVE ng/mL
Creatinine, Urine: 89.5 mg/dL (ref >20.0)
ECSTASY: NEGATIVE ng/mL
FENTANYL URINE: NEGATIVE ng/mL
Meperidine, Ur: NEGATIVE ng/mL
Nitrites, Initial: NEGATIVE ug/mL
PROPOXYPHENE: NEGATIVE ng/mL
TAPENTADOLUR: NEGATIVE ng/mL
Tramadol Scrn, Ur: NEGATIVE ng/mL
Zolpidem, Urine: NEGATIVE ng/mL
pH, Initial: 7 pH (ref 4.5–8.9)

## 2013-07-31 LAB — OXYCODONE, URINE (LC/MS-MS)
Noroxycodone, Ur: >2500 ng/mL — AB
Oxycodone, ur: 1004 ng/mL — AB
Oxymorphone: 1499 ng/mL — AB

## 2013-08-02 DIAGNOSIS — D649 Anemia, unspecified: Secondary | ICD-10-CM | POA: Diagnosis not present

## 2013-08-02 DIAGNOSIS — R110 Nausea: Secondary | ICD-10-CM | POA: Diagnosis not present

## 2013-08-02 DIAGNOSIS — M549 Dorsalgia, unspecified: Secondary | ICD-10-CM | POA: Diagnosis not present

## 2013-08-02 DIAGNOSIS — R1031 Right lower quadrant pain: Secondary | ICD-10-CM | POA: Diagnosis not present

## 2013-08-02 DIAGNOSIS — D5700 Hb-SS disease with crisis, unspecified: Secondary | ICD-10-CM | POA: Diagnosis not present

## 2013-08-02 DIAGNOSIS — K5900 Constipation, unspecified: Secondary | ICD-10-CM | POA: Diagnosis not present

## 2013-08-02 DIAGNOSIS — R918 Other nonspecific abnormal finding of lung field: Secondary | ICD-10-CM | POA: Diagnosis not present

## 2013-08-02 DIAGNOSIS — Z885 Allergy status to narcotic agent status: Secondary | ICD-10-CM | POA: Diagnosis not present

## 2013-08-02 DIAGNOSIS — F17200 Nicotine dependence, unspecified, uncomplicated: Secondary | ICD-10-CM | POA: Diagnosis not present

## 2013-08-02 DIAGNOSIS — D57819 Other sickle-cell disorders with crisis, unspecified: Secondary | ICD-10-CM | POA: Diagnosis not present

## 2013-08-02 DIAGNOSIS — R109 Unspecified abdominal pain: Secondary | ICD-10-CM | POA: Diagnosis not present

## 2013-08-02 DIAGNOSIS — R112 Nausea with vomiting, unspecified: Secondary | ICD-10-CM | POA: Diagnosis not present

## 2013-08-09 ENCOUNTER — Ambulatory Visit: Payer: Medicare Other | Admitting: Internal Medicine

## 2013-08-10 ENCOUNTER — Telehealth: Payer: Self-pay | Admitting: Internal Medicine

## 2013-08-10 DIAGNOSIS — IMO0001 Reserved for inherently not codable concepts without codable children: Secondary | ICD-10-CM | POA: Diagnosis not present

## 2013-08-10 DIAGNOSIS — Z79899 Other long term (current) drug therapy: Secondary | ICD-10-CM | POA: Diagnosis not present

## 2013-08-10 DIAGNOSIS — M545 Low back pain, unspecified: Secondary | ICD-10-CM | POA: Diagnosis not present

## 2013-08-10 DIAGNOSIS — D571 Sickle-cell disease without crisis: Secondary | ICD-10-CM | POA: Diagnosis not present

## 2013-08-10 DIAGNOSIS — M549 Dorsalgia, unspecified: Secondary | ICD-10-CM | POA: Diagnosis not present

## 2013-08-10 NOTE — Telephone Encounter (Signed)
Oxycodone and Methadone not due until 08/17/2013

## 2013-08-10 NOTE — Telephone Encounter (Signed)
Patient stated he was told to call today for RX for Chantix.

## 2013-08-11 ENCOUNTER — Telehealth (HOSPITAL_COMMUNITY): Payer: Self-pay | Admitting: Internal Medicine

## 2013-08-11 ENCOUNTER — Encounter (HOSPITAL_COMMUNITY): Payer: Self-pay

## 2013-08-11 ENCOUNTER — Inpatient Hospital Stay (HOSPITAL_COMMUNITY)
Admission: AD | Admit: 2013-08-11 | Discharge: 2013-08-14 | DRG: 812 | Disposition: A | Discharge status: Home / Self Care | Payer: Medicare Other | Attending: Family Medicine | Admitting: Family Medicine

## 2013-08-11 DIAGNOSIS — D571 Sickle-cell disease without crisis: Secondary | ICD-10-CM

## 2013-08-11 DIAGNOSIS — Z833 Family history of diabetes mellitus: Secondary | ICD-10-CM | POA: Diagnosis not present

## 2013-08-11 DIAGNOSIS — R52 Pain, unspecified: Secondary | ICD-10-CM | POA: Diagnosis present

## 2013-08-11 DIAGNOSIS — J9819 Other pulmonary collapse: Secondary | ICD-10-CM | POA: Diagnosis not present

## 2013-08-11 DIAGNOSIS — F17200 Nicotine dependence, unspecified, uncomplicated: Secondary | ICD-10-CM | POA: Diagnosis present

## 2013-08-11 DIAGNOSIS — M549 Dorsalgia, unspecified: Secondary | ICD-10-CM | POA: Diagnosis not present

## 2013-08-11 DIAGNOSIS — K5900 Constipation, unspecified: Secondary | ICD-10-CM | POA: Diagnosis present

## 2013-08-11 DIAGNOSIS — D72829 Elevated white blood cell count, unspecified: Secondary | ICD-10-CM | POA: Diagnosis not present

## 2013-08-11 DIAGNOSIS — G894 Chronic pain syndrome: Secondary | ICD-10-CM | POA: Diagnosis not present

## 2013-08-11 DIAGNOSIS — D5700 Hb-SS disease with crisis, unspecified: Principal | ICD-10-CM | POA: Diagnosis present

## 2013-08-11 DIAGNOSIS — M25569 Pain in unspecified knee: Secondary | ICD-10-CM

## 2013-08-11 DIAGNOSIS — F1190 Opioid use, unspecified, uncomplicated: Secondary | ICD-10-CM

## 2013-08-11 DIAGNOSIS — M25519 Pain in unspecified shoulder: Secondary | ICD-10-CM

## 2013-08-11 HISTORY — DX: Pneumonia, unspecified organism: J18.9

## 2013-08-11 LAB — COMPREHENSIVE METABOLIC PANEL
ALT: 29 U/L (ref 0–53)
AST: 33 U/L (ref 0–37)
Albumin: 4.2 g/dL (ref 3.5–5.2)
Alkaline Phosphatase: 41 U/L (ref 39–117)
BILIRUBIN TOTAL: 0.7 mg/dL (ref 0.3–1.2)
BUN: 10 mg/dL (ref 6–23)
CHLORIDE: 105 meq/L (ref 96–112)
CO2: 23 meq/L (ref 19–32)
CREATININE: 0.74 mg/dL (ref 0.50–1.35)
Calcium: 9.5 mg/dL (ref 8.4–10.5)
GFR calc Af Amer: >90 mL/min (ref >90)
Glucose, Bld: 111 mg/dL — ABNORMAL HIGH (ref 70–99)
POTASSIUM: 4.3 meq/L (ref 3.7–5.3)
Sodium: 142 mEq/L (ref 137–147)
Total Protein: 7.6 g/dL (ref 6.0–8.3)

## 2013-08-11 LAB — CBC
HCT: 28 % — ABNORMAL LOW (ref 39.0–52.0)
Hemoglobin: 10.1 g/dL — ABNORMAL LOW (ref 13.0–17.0)
MCH: 35.1 pg — ABNORMAL HIGH (ref 26.0–34.0)
MCHC: 36.1 g/dL — ABNORMAL HIGH (ref 30.0–36.0)
MCV: 97.2 fL (ref 78.0–100.0)
PLATELETS: 473 10*3/uL — AB (ref 150–400)
RBC: 2.88 MIL/uL — AB (ref 4.22–5.81)
RDW: 16.2 % — ABNORMAL HIGH (ref 11.5–15.5)
WBC: 10.1 10*3/uL (ref 4.0–10.5)

## 2013-08-11 LAB — CBC WITH DIFFERENTIAL/PLATELET
BASOS ABS: 0 10*3/uL (ref 0.0–0.1)
Basophils Relative: 0 % (ref 0–1)
Eosinophils Absolute: 0.1 10*3/uL (ref 0.0–0.7)
Eosinophils Relative: 1 % (ref 0–5)
HCT: 29.3 % — ABNORMAL LOW (ref 39.0–52.0)
HEMOGLOBIN: 10.7 g/dL — AB (ref 13.0–17.0)
LYMPHS PCT: 30 % (ref 12–46)
Lymphs Abs: 2.5 10*3/uL (ref 0.7–4.0)
MCH: 35 pg — ABNORMAL HIGH (ref 26.0–34.0)
MCHC: 36.5 g/dL — ABNORMAL HIGH (ref 30.0–36.0)
MCV: 95.8 fL (ref 78.0–100.0)
MONOS PCT: 6 % (ref 3–12)
Monocytes Absolute: 0.5 10*3/uL (ref 0.1–1.0)
NEUTROS ABS: 5.3 10*3/uL (ref 1.7–7.7)
Neutrophils Relative %: 63 % (ref 43–77)
Platelets: 497 10*3/uL — ABNORMAL HIGH (ref 150–400)
RBC: 3.06 MIL/uL — ABNORMAL LOW (ref 4.22–5.81)
RDW: 15.8 % — ABNORMAL HIGH (ref 11.5–15.5)
WBC: 8.5 10*3/uL (ref 4.0–10.5)

## 2013-08-11 LAB — CREATININE, SERUM
Creatinine, Ser: 0.72 mg/dL (ref 0.50–1.35)
GFR calc non Af Amer: >90 mL/min (ref >90)

## 2013-08-11 MED ORDER — HYDROMORPHONE HCL PF 2 MG/ML IJ SOLN
2.7000 mg | Freq: Once | INTRAMUSCULAR | Status: AC
  Administered 2013-08-11: 2.7 mg via INTRAVENOUS
  Filled 2013-08-11: qty 2

## 2013-08-11 MED ORDER — HEPARIN SODIUM (PORCINE) 5000 UNIT/ML IJ SOLN
5000.0000 [IU] | Freq: Three times a day (TID) | INTRAMUSCULAR | Status: DC
  Administered 2013-08-11 – 2013-08-13 (×6): 5000 [IU] via SUBCUTANEOUS
  Filled 2013-08-11 (×11): qty 1

## 2013-08-11 MED ORDER — KETOROLAC TROMETHAMINE 15 MG/ML IJ SOLN: 15.0000 mg | Freq: Four times a day (QID) | INTRAMUSCULAR | Status: DC

## 2013-08-11 MED ORDER — FOLIC ACID 1 MG PO TABS
1.0000 mg | ORAL_TABLET | Freq: Every day | ORAL | Status: DC
  Administered 2013-08-11 – 2013-08-12 (×3): 1 mg via ORAL
  Filled 2013-08-11 (×3): qty 1

## 2013-08-11 MED ORDER — NALOXONE HCL 0.4 MG/ML IJ SOLN: 0.4000 mg | INTRAMUSCULAR | Status: DC | PRN

## 2013-08-11 MED ORDER — SODIUM CHLORIDE 0.9 % IJ SOLN: 9.0000 mL | INTRAMUSCULAR | Status: DC | PRN

## 2013-08-11 MED ORDER — DIPHENHYDRAMINE HCL 12.5 MG/5ML PO ELIX: 12.5000 mg | ORAL_SOLUTION | Freq: Four times a day (QID) | ORAL | Status: DC | PRN

## 2013-08-11 MED ORDER — DIPHENHYDRAMINE HCL 50 MG/ML IJ SOLN: 12.5000 mg | Freq: Four times a day (QID) | INTRAMUSCULAR | Status: DC | PRN

## 2013-08-11 MED ORDER — KETOROLAC TROMETHAMINE 15 MG/ML IJ SOLN
15.0000 mg | Freq: Four times a day (QID) | INTRAMUSCULAR | Status: DC
  Administered 2013-08-11 – 2013-08-14 (×11): 15 mg via INTRAVENOUS
  Filled 2013-08-11 (×16): qty 1

## 2013-08-11 MED ORDER — HYDROXYUREA 500 MG PO CAPS
1500.0000 mg | ORAL_CAPSULE | Freq: Every day | ORAL | Status: DC
  Administered 2013-08-11 – 2013-08-13 (×3): 1500 mg via ORAL
  Filled 2013-08-11 (×4): qty 3

## 2013-08-11 MED ORDER — VITAMIN C 500 MG PO TABS
500.0000 mg | ORAL_TABLET | Freq: Every day | ORAL | Status: DC
  Administered 2013-08-11 – 2013-08-13 (×3): 500 mg via ORAL
  Filled 2013-08-11 (×4): qty 1

## 2013-08-11 MED ORDER — METHADONE HCL 5 MG PO TABS
5.0000 mg | ORAL_TABLET | Freq: Every day | ORAL | Status: DC
  Administered 2013-08-11 – 2013-08-13 (×3): 5 mg via ORAL
  Filled 2013-08-11 (×3): qty 1

## 2013-08-11 MED ORDER — KETOROLAC TROMETHAMINE 30 MG/ML IJ SOLN
30.0000 mg | Freq: Once | INTRAMUSCULAR | Status: AC
  Administered 2013-08-11: 30 mg via INTRAVENOUS
  Filled 2013-08-11: qty 1

## 2013-08-11 MED ORDER — DEXTROSE-NACL 5-0.45 % IV SOLN
INTRAVENOUS | Status: DC
  Administered 2013-08-11 – 2013-08-12 (×2): via INTRAVENOUS
  Administered 2013-08-12: 1000 mL via INTRAVENOUS
  Administered 2013-08-12 – 2013-08-13 (×2): via INTRAVENOUS

## 2013-08-11 MED ORDER — HYDROMORPHONE 0.3 MG/ML IV SOLN
INTRAVENOUS | Status: DC
  Administered 2013-08-11 (×4): via INTRAVENOUS
  Administered 2013-08-11: 10.49 mg via INTRAVENOUS
  Administered 2013-08-12 (×2): via INTRAVENOUS
  Filled 2013-08-11 (×7): qty 25

## 2013-08-11 MED ORDER — HYDROMORPHONE HCL PF 2 MG/ML IJ SOLN
2.2000 mg | Freq: Once | INTRAMUSCULAR | Status: AC
  Administered 2013-08-11: 2.2 mg via INTRAVENOUS
  Filled 2013-08-11: qty 2

## 2013-08-11 MED ORDER — DOCUSATE SODIUM 100 MG PO CAPS
100.0000 mg | ORAL_CAPSULE | Freq: Two times a day (BID) | ORAL | Status: DC
  Administered 2013-08-11 – 2013-08-13 (×5): 100 mg via ORAL
  Filled 2013-08-11 (×7): qty 1

## 2013-08-11 MED ORDER — HYDROMORPHONE HCL PF 2 MG/ML IJ SOLN
1.6000 mg | Freq: Once | INTRAMUSCULAR | Status: AC
  Administered 2013-08-11: 1.6 mg via INTRAVENOUS
  Filled 2013-08-11: qty 1

## 2013-08-11 MED ORDER — ADULT MULTIVITAMIN W/MINERALS CH
1.0000 | ORAL_TABLET | Freq: Every day | ORAL | Status: DC
  Administered 2013-08-11 – 2013-08-13 (×3): 1 via ORAL
  Filled 2013-08-11 (×4): qty 1

## 2013-08-11 MED ORDER — VITAMIN D3 25 MCG (1000 UNIT) PO TABS
1000.0000 [IU] | ORAL_TABLET | Freq: Every day | ORAL | Status: DC
  Administered 2013-08-11 – 2013-08-13 (×3): 1000 [IU] via ORAL
  Filled 2013-08-11 (×4): qty 1

## 2013-08-11 MED ORDER — FOLIC ACID 1 MG PO TABS
1.0000 mg | ORAL_TABLET | Freq: Every day | ORAL | Status: DC
  Administered 2013-08-11 – 2013-08-13 (×4): 1 mg via ORAL
  Filled 2013-08-11 (×4): qty 1

## 2013-08-11 MED ORDER — ONDANSETRON HCL 4 MG/2ML IJ SOLN
4.0000 mg | Freq: Four times a day (QID) | INTRAMUSCULAR | Status: DC | PRN
  Filled 2013-08-11: qty 2

## 2013-08-11 MED ORDER — GABAPENTIN 300 MG PO CAPS
300.0000 mg | ORAL_CAPSULE | Freq: Two times a day (BID) | ORAL | Status: DC
  Administered 2013-08-11 – 2013-08-13 (×5): 300 mg via ORAL
  Filled 2013-08-11 (×7): qty 1

## 2013-08-11 NOTE — Progress Notes (Signed)
Report called to nurse on [place]; pt alert and oriented; transported by wheelchair to floor

## 2013-08-11 NOTE — Telephone Encounter (Signed)
Notified pt that his prescription is not due for refill until 08/17/13 per Dr. Zigmund Daniel; told pt he will need to call next week to request a refill; pt verbalizes understanding

## 2013-08-11 NOTE — H&P (Signed)
Hospital Admission Note Date: 08/11/2013  Patient name: Alejandro Soto Medical record number: [id number] Date of birth: 25-May-1986 Age: 28 y.o. Gender: male PCP: MATTHEWS,MICHELLE A., MD  Attending physician: Murlean Iba, MD  Chief Complaint: Sickle cell anemia with pain  History of Present Illness:  28 year old male with sickle cell anemia Hgb SS presents with acute pain to lower back, which is consistent with previous sickle cell crisis. Patient states that 8-10 pain to lower back started 2 days ago. Patient states that he took Oxycodone 15 mg as previously prescribed at 4 am with minimal relief. Patient states that he consistently takes prescribed medications. Attributes sickle cell crisis to current weather conditions. Patient denies nausea, vomiting, or diarrhea.  Patient was seen for extended observation at [place]. Patient was started on IVF and  weight based rapid re-dosing per protocol. Pain intensity remained greater than 7/10. Weight-based PCA dilaudid was utilized while under extended observation, patient used a total of 10.49 mg with 27 demands and 21 deliveries. Patient stated that he is unable to manage current pain intensity, which is 7-8/10 prior to transfer.      Scheduled Meds: . HYDROmorphone PCA 0.3 mg/mL   Intravenous 6 times per day   Continuous Infusions: . dextrose 5 % and 0.45% NaCl 125 mL/hr at 08/11/13 1321   PRN Meds:.diphenhydrAMINE, diphenhydrAMINE, naloxone, ondansetron (ZOFRAN) IV, sodium chloride Allergies: Amoxicillin and Morphine and related Past Medical History  Diagnosis Date  . Sickle cell anemia    History reviewed. No pertinent past surgical history. Family History  Problem Relation Age of Onset  . Diabetes Father   . Cancer Father 28    Prostate  . Asthma Brother   . Cancer Maternal Grandmother     colon    History   Social History  . Marital Status: Single    Spouse Name: N/A    Number of Children: N/A   . Years of Education: N/A   Occupational History  . Not on file.   Social History Main Topics  . Smoking status: Current Some Day Smoker -- 5 years    Types: Cigarettes  . Smokeless tobacco: Not on file     Comment: 1-3 daily  . Alcohol Use: Yes     Comment: occ.  . Drug Use: No  . Sexual Activity: Yes   Other Topics Concern  . Not on file   Social History Narrative  . No narrative on file    Review of Systems:  Constitutional: No weight loss, night sweats, Fevers, chills, fatigue.  HEENT: No headaches, dizziness, seizures, vision changes, difficulty swallowing,Tooth/dental problems,Sore throat, No sneezing, itching, ear ache, nasal congestion, post nasal drip,  Cardio-vascular: No chest pain, Orthopnea, PND, swelling in lower extremities, anasarca, dizziness, palpitations  GI: No heartburn, indigestion, abdominal pain, nausea, vomiting, diarrhea, change in bowel habits, loss of appetite  Resp: No shortness of breath with exertion or at rest. No excess mucus, no productive cough, No non-productive cough, No coughing up of blood.No change in color of mucus.No wheezing.No chest wall deformity  Skin: no rash or lesions.  GU: no dysuria, change in color of urine, no urgency or frequency. No flank pain.  Musculoskeletal: No joint pain or swelling. Decreased range of motion to low back. Low back pain present.  Psych: No change in mood or affect. No depression or anxiety. No memory loss.    Physical Exam: No intake or output data in the 24 hours ending 08/11/13 1825  General: Alert, awake, oriented x3, in no acute distress.  HEENT: Early/AT PEERL, EOMI Neck: Trachea midline,  no masses, no thyromegal,y no JVD, no carotid bruit OROPHARYNX:  Moist, No exudate/ erythema/lesions.  Heart: Regular rate and rhythm, without murmurs, rubs, gallops, PMI non-displaced, no heaves or thrills on palpation.  Lungs: Clear to auscultation, no wheezing or rhonchi noted. No increased vocal fremitus  resonant to percussion  Abdomen: Soft, nontender, nondistended, positive bowel sounds, no masses no hepatosplenomegaly noted..  Neuro: No focal neurological deficits noted cranial nerves II through XII grossly intact. DTRs 2+ bilaterally upper and lower extremities. Strength 5 out of 5 in bilateral upper and lower extremities. Musculoskeletal: No warm swelling or erythema around joints, no spinal tenderness noted. Psychiatric: Patient alert and oriented x3, good insight and cognition, good recent to remote recall. Lymph node survey: No cervical axillary or inguinal lymphadenopathy noted.  Lab results:  Recent Labs  08/11/13 1610  NA 142  K 4.3  CL 105  CO2 23  GLUCOSE 111*  BUN 10  CREATININE 0.74  CALCIUM 9.5    Recent Labs  08/11/13 1610  AST 33  ALT 29  ALKPHOS 41  BILITOT 0.7  PROT 7.6  ALBUMIN 4.2   No results found for this basename: LIPASE, AMYLASE,  in the last 72 hours  Recent Labs  08/11/13 1610  WBC 8.5  NEUTROABS 5.3  HGB 10.7*  HCT 29.3*  MCV 95.8  PLT 497*   No results found for this basename: CKTOTAL, CKMB, CKMBINDEX, TROPONINI,  in the last 72 hours No components found with this basename: POCBNP,  No results found for this basename: DDIMER,  in the last 72 hours No results found for this basename: HGBA1C,  in the last 72 hours No results found for this basename: CHOL, HDL, LDLCALC, TRIG, CHOLHDL, LDLDIRECT,  in the last 72 hours No results found for this basename: TSH, T4TOTAL, FREET3, T3FREE, THYROIDAB,  in the last 72 hours No results found for this basename: VITAMINB12, FOLATE, FERRITIN, TIBC, IRON, RETICCTPCT,  in the last 72 hours Imaging results:  No results found. Other results: EKG: None   Assessment & Plan:  Sickle cell anemia with pain: Continue weight based PCA dilaudid per protocol, IV analgesics, adjuvant therapy. Current plan reviewed with Dr. Murvin Natal.   Alejandro Soto 08/11/2013, 6:25 PM   Alejandro Soto 08/11/2013,  6:25 PM

## 2013-08-11 NOTE — Telephone Encounter (Signed)
Notified pt to call next week for refill

## 2013-08-11 NOTE — Telephone Encounter (Signed)
Pt states having pain in back and experiencing no relief from home meds; pt requests to come to [facility] for treatment; notified MD; pt told to come to center for evaluation; pt verbalizes understanding

## 2013-08-11 NOTE — H&P (Signed)
[facility] History and Physical  Ogden Handlin UMP:[id number] DOB: 10-10-1985 DOA: 08/11/2013   PCP: MATTHEWS,MICHELLE A., MD   Chief Complaint: Back pain  HPI: 28 year old male with sickle cell anemia Hgb SS presents with acute pain to lower back, which is consistent with previous sickle cell crisis. Patient states that 8-10 pain to lower back started 2 days ago. Patient states that he  took Oxycodone 15 mg as previously prescribed at 4 am  with minimal relief. Patient states that he consistently takes prescribed medications. Attributes sickle cell crisis to current weather conditions. Patient denies nausea, vomiting, or diarrhea.  Review of Systems:  Constitutional: No weight loss, night sweats, Fevers, chills, fatigue.  HEENT: No headaches, dizziness, seizures, vision changes, difficulty swallowing,Tooth/dental problems,Sore throat, No sneezing, itching, ear ache, nasal congestion, post nasal drip,  Cardio-vascular: No chest pain, Orthopnea, PND, swelling in lower extremities, anasarca, dizziness, palpitations  GI: No heartburn, indigestion, abdominal pain, nausea, vomiting, diarrhea, change in bowel habits, loss of appetite  Resp: No shortness of breath with exertion or at rest. No excess mucus, no productive cough, No non-productive cough, No coughing up of blood.No change in color of mucus.No wheezing.No chest wall deformity  Skin: no rash or lesions.  GU: no dysuria, change in color of urine, no urgency or frequency. No flank pain.  Musculoskeletal: No joint pain or swelling. Decreased range of motion to low back. Low back pain present.  Psych: No change in mood or affect. No depression or anxiety. No memory loss.    Past Medical History  Diagnosis Date  . Sickle cell anemia    No past surgical history on file. Social History:  reports that he has been smoking Cigarettes.  He has been smoking about 0.00 packs per day for the past 5 years. He does not have any smokeless  tobacco history on file. He reports that he drinks alcohol. He reports that he does not use illicit drugs.  Allergies  Allergen Reactions  . Amoxicillin Diarrhea  . Morphine And Related Other (See Comments)    Made his stomach hurt    Family History  Problem Relation Age of Onset  . Diabetes Father   . Cancer Father 84    Prostate  . Asthma Brother   . Cancer Maternal Grandmother     colon     Prior to Admission medications   Medication Sig Start Date End Date Taking? Authorizing Provider  cholecalciferol (VITAMIN D) 1000 UNITS tablet Take 1,000 Units by mouth daily.    Historical Provider, MD  folic acid (FOLVITE) 1 MG tablet Take 1 mg by mouth daily.    Historical Provider, MD  gabapentin (NEURONTIN) 300 MG capsule Take 1 capsule (300 mg total) by mouth 2 (two) times daily. 05/24/13   Leana Gamer, MD  hydroxyurea (HYDREA) 500 MG capsule Take 1,500 mg by mouth daily. May take with food to minimize GI side effects.    Historical Provider, MD  ibuprofen (ADVIL,MOTRIN) 600 MG tablet Take 600 mg by mouth as needed for pain.    Historical Provider, MD  methadone (DOLOPHINE) 5 MG tablet Take 1 tablet (5 mg total) by mouth at bedtime. 07/17/13   Leana Gamer, MD  Multiple Vitamin (MULTIVITAMIN) tablet Take 1 tablet by mouth daily.    Historical Provider, MD  oxyCODONE (ROXICODONE) 15 MG immediate release tablet Take 1 tablet (15 mg total) by mouth every 4 (four) hours as needed for pain. Do Not Fill before 08/03/2013.  07/27/13   Leana Gamer, MD  vitamin C (ASCORBIC ACID) 500 MG tablet Take 500 mg by mouth daily.    Historical Provider, MD   Physical Exam: There were no vitals filed for this visit. BP 122/64  Pulse 75  Temp(Src) 97.8 F (36.6 C) (Oral)  Resp 16  SpO2 100%  General Appearance:    Alert, cooperative, no distress, appears stated age  Head:    Normocephalic, without obvious abnormality, atraumatic  Eyes:    PERRL, conjunctiva/corneas clear, EOM's  intact, fundi    benign, both eyes       Ears:    Normal TM's and external ear canals, both ears  Nose:   Nares normal, septum midline, mucosa normal, no drainage    or sinus tenderness  Throat:   Lips, mucosa, and tongue normal; teeth and gums normal  Neck:   Supple, symmetrical, trachea midline, no adenopathy;       thyroid:  No enlargement/tenderness/nodules; no carotid   bruit or JVD  Back:     Symmetric, no curvature, decreased ROM, no CVA tenderness  Lungs:     Clear to auscultation bilaterally, respirations unlabored  Chest wall:    No tenderness or deformity  Heart:    Regular rate and rhythm, S1 and S2 normal, no murmur, rub   or gallop  Abdomen:     Soft, non-tender, bowel sounds active all four quadrants,    no masses, no organomegaly  Extremities:   Extremities normal, atraumatic, no cyanosis or edema  Pulses:   2+ and symmetric all extremities  Skin:   Skin color, texture, turgor normal, no rashes or lesions  Lymph nodes:   Cervical, supraclavicular normal  Neurologic:   CNII-XII intact. Normal strength, sensation and reflexes      throughout    Labs on Admission:   Basic Metabolic Panel: No results found for this basename: NA, K, CL, CO2, GLUCOSE, BUN, CREATININE, CALCIUM, MG, PHOS,  in the last 168 hours Liver Function Tests: No results found for this basename: AST, ALT, ALKPHOS, BILITOT, PROT, ALBUMIN,  in the last 168 hours CBC:  Recent Labs Lab 08/11/13 1610  WBC 8.5  NEUTROABS 5.3  HGB 10.7*  HCT 29.3*  MCV 95.8  PLT 497*   BNP: No components found with this basename: POCBNP,    Radiological Exams on Admission: No results found.  EKG: Independently reviewed.  Assessment/Plan:  1. Acute back pain: Start IVF, Toradol, and IV analgesics.  IV Dilaudid with weight based rapid redosing per protocol. If pain level greater than 7, will transition to weight based PCA Dilaudid.   Labs: CBC w/differential and complete metabolic panel  After reviewing labs  and reassessment, pt's pain is still 7/10 and patient reporting that he does not believe that he can manage pain at home.  Will pursue inpatient admission for further pain management.   Time spent: 35 minuytes Code Status: Full Family Communication: None Disposition Plan: Home when stable  Dorena Dew, MD  Pager [phone]  If 7PM-7AM, please contact night-coverage [email or web address] Password [facility] 08/11/2013, 12:19 PM  Pt was seen and examined with NP Hollis,  H&P and orders reviewed.  Pt to be admitted for further pain management, labs, etc.   Gerlene Fee, MD, CDE, [place], [place]

## 2013-08-12 ENCOUNTER — Inpatient Hospital Stay (HOSPITAL_COMMUNITY): Payer: Medicare Other

## 2013-08-12 DIAGNOSIS — G894 Chronic pain syndrome: Secondary | ICD-10-CM

## 2013-08-12 DIAGNOSIS — D5700 Hb-SS disease with crisis, unspecified: Secondary | ICD-10-CM | POA: Diagnosis not present

## 2013-08-12 DIAGNOSIS — M549 Dorsalgia, unspecified: Secondary | ICD-10-CM | POA: Diagnosis not present

## 2013-08-12 DIAGNOSIS — J9819 Other pulmonary collapse: Secondary | ICD-10-CM | POA: Diagnosis not present

## 2013-08-12 DIAGNOSIS — D72829 Elevated white blood cell count, unspecified: Secondary | ICD-10-CM | POA: Diagnosis not present

## 2013-08-12 LAB — RETICULOCYTES
RBC.: 2.87 MIL/uL — AB (ref 4.22–5.81)
RETIC CT PCT: 5.2 % — AB (ref 0.4–3.1)
Retic Count, Absolute: 149.2 10*3/uL (ref 19.0–186.0)

## 2013-08-12 LAB — COMPREHENSIVE METABOLIC PANEL
ALT: 22 U/L (ref 0–53)
AST: 22 U/L (ref 0–37)
Albumin: 3.7 g/dL (ref 3.5–5.2)
Alkaline Phosphatase: 45 U/L (ref 39–117)
BUN: 7 mg/dL (ref 6–23)
CALCIUM: 8.9 mg/dL (ref 8.4–10.5)
CO2: 24 mEq/L (ref 19–32)
Chloride: 103 mEq/L (ref 96–112)
Creatinine, Ser: 0.67 mg/dL (ref 0.50–1.35)
GFR calc Af Amer: >90 mL/min (ref >90)
Glucose, Bld: 112 mg/dL — ABNORMAL HIGH (ref 70–99)
Potassium: 3.8 mEq/L (ref 3.7–5.3)
Sodium: 140 mEq/L (ref 137–147)
TOTAL PROTEIN: 6.5 g/dL (ref 6.0–8.3)
Total Bilirubin: 0.6 mg/dL (ref 0.3–1.2)

## 2013-08-12 LAB — CBC WITH DIFFERENTIAL/PLATELET
Basophils Absolute: 0 10*3/uL (ref 0.0–0.1)
Basophils Relative: 0 % (ref 0–1)
EOS PCT: 2 % (ref 0–5)
Eosinophils Absolute: 0.2 10*3/uL (ref 0.0–0.7)
HCT: 27.7 % — ABNORMAL LOW (ref 39.0–52.0)
HEMOGLOBIN: 9.7 g/dL — AB (ref 13.0–17.0)
LYMPHS ABS: 5 10*3/uL — AB (ref 0.7–4.0)
LYMPHS PCT: 35 % (ref 12–46)
MCH: 33.8 pg (ref 26.0–34.0)
MCHC: 35 g/dL (ref 30.0–36.0)
MCV: 96.5 fL (ref 78.0–100.0)
Monocytes Absolute: 0.8 10*3/uL (ref 0.1–1.0)
Monocytes Relative: 6 % (ref 3–12)
NEUTROS PCT: 58 % (ref 43–77)
Neutro Abs: 8.3 10*3/uL — ABNORMAL HIGH (ref 1.7–7.7)
Platelets: 470 10*3/uL — ABNORMAL HIGH (ref 150–400)
RBC: 2.87 MIL/uL — AB (ref 4.22–5.81)
RDW: 15.9 % — ABNORMAL HIGH (ref 11.5–15.5)
WBC: 14.4 10*3/uL — ABNORMAL HIGH (ref 4.0–10.5)

## 2013-08-12 LAB — URINALYSIS, ROUTINE W REFLEX MICROSCOPIC
Bilirubin Urine: NEGATIVE
Glucose, UA: NEGATIVE mg/dL
Hgb urine dipstick: NEGATIVE
KETONES UR: NEGATIVE mg/dL
Leukocytes, UA: NEGATIVE
NITRITE: NEGATIVE
PROTEIN: NEGATIVE mg/dL
Specific Gravity, Urine: 1.009 (ref 1.005–1.030)
Urobilinogen, UA: 0.2 mg/dL (ref 0.0–1.0)
pH: 5.5 (ref 5.0–8.0)

## 2013-08-12 LAB — RAPID URINE DRUG SCREEN, HOSP PERFORMED
Amphetamines: NOT DETECTED
Barbiturates: NOT DETECTED
Benzodiazepines: NOT DETECTED
Cocaine: NOT DETECTED
Opiates: POSITIVE — AB
Tetrahydrocannabinol: NOT DETECTED

## 2013-08-12 LAB — MAGNESIUM: Magnesium: 1.9 mg/dL (ref 1.5–2.5)

## 2013-08-12 MED ORDER — HYDROMORPHONE 0.3 MG/ML IV SOLN
INTRAVENOUS | Status: DC
  Administered 2013-08-12: 8.87 mg via INTRAVENOUS
  Administered 2013-08-12: 25 mL via INTRAVENOUS
  Administered 2013-08-12: 16:00:00 via INTRAVENOUS
  Administered 2013-08-12: 25 mL via INTRAVENOUS
  Administered 2013-08-12 – 2013-08-13 (×2): via INTRAVENOUS
  Administered 2013-08-13: 24.59 mg via INTRAVENOUS
  Administered 2013-08-13 (×2): via INTRAVENOUS
  Administered 2013-08-13: 13.72 mg via INTRAVENOUS
  Administered 2013-08-13: 23:00:00 via INTRAVENOUS
  Administered 2013-08-13: 12.1 mg via INTRAVENOUS
  Administered 2013-08-13: 03:00:00 via INTRAVENOUS
  Administered 2013-08-13: 15.08 mg via INTRAVENOUS
  Administered 2013-08-13: 15:00:00 via INTRAVENOUS
  Administered 2013-08-13: 10.39 mg via INTRAVENOUS
  Administered 2013-08-14: 13.22 mg via INTRAVENOUS
  Administered 2013-08-14 (×2): via INTRAVENOUS
  Administered 2013-08-14: 12.19 mg via INTRAVENOUS
  Administered 2013-08-14: 06:00:00 via INTRAVENOUS
  Filled 2013-08-12 (×20): qty 25

## 2013-08-12 MED ORDER — ALUM & MAG HYDROXIDE-SIMETH 200-200-20 MG/5ML PO SUSP
30.0000 mL | Freq: Once | ORAL | Status: AC
  Administered 2013-08-12: 30 mL via ORAL
  Filled 2013-08-12: qty 30

## 2013-08-12 MED ORDER — SENNA 8.6 MG PO TABS
1.0000 | ORAL_TABLET | Freq: Every day | ORAL | Status: DC
  Filled 2013-08-12 (×2): qty 1

## 2013-08-12 MED ORDER — HYDROMORPHONE HCL PF 1 MG/ML IJ SOLN
1.0000 mg | INTRAMUSCULAR | Status: DC | PRN
  Administered 2013-08-13 (×2): 1 mg via INTRAVENOUS
  Filled 2013-08-12 (×4): qty 1

## 2013-08-12 MED ORDER — ZOLPIDEM TARTRATE 5 MG PO TABS
5.0000 mg | ORAL_TABLET | Freq: Once | ORAL | Status: AC
  Administered 2013-08-12: 5 mg via ORAL
  Filled 2013-08-12: qty 1

## 2013-08-12 NOTE — Progress Notes (Signed)
PHYSICIAN PROGRESS NOTE  Alejandro Soto NWG:[id number] DOB: [birth year] DOA: 08/11/2013  08/12/2013   PCP: MATTHEWS,MICHELLE A., MD  Assessment/Plan: 1. Acute Vaso-Occlusive SS Crisis - Pt reports that pain had been improving overnight but reports that back pain and neck pain has returned this morning.  Pt says that this has been a pattern that he was having at home.  He says that he has snoring and been told that he stops breathing while sleeping sometimes. He will need to set up for an outpatient sleep study.  Pt has had 15.16 mg dilaudid with 41 demands, 31 deliveries since admission.  Will adjust PCA doses to try to get better pain control.  2. Leukocytosis - no clinical signs of infection, likely this is related to acute crisis, will check urinalysis, chest xray today to rule out infection, follow CBC.   Code Status: FULL Family Communication: no family at  bedside  Disposition Plan: not ready for discharge  HPI/Subjective: Pt reports that pain is 8/10 this morning.  It involves lower back and neck.    Objective: Filed Vitals:   08/12/13 0813  BP:   Pulse:   Temp:   Resp: 14    Intake/Output Summary (Last 24 hours) at 08/12/13 0843 Last data filed at 08/12/13 0820  Gross per 24 hour  Intake 3051.67 ml  Output    300 ml  Net 2751.67 ml   Filed Weights   08/11/13 1842 08/12/13 0620  Weight: 143 lb 9.6 oz (65.137 kg) 145 lb 11.2 oz (66.089 kg)    Exam:   General:  Awake, alert, no distress  Cardiovascular: normal s1, s2 sounds   Respiratory: BBS clear to auscultation  Abdomen: soft, nondistended, nontender, no masses, normal BS  Musculoskeletal: no CCE   Data Reviewed: Basic Metabolic Panel:  Recent Labs Lab 08/11/13 1610 08/11/13 1950 08/12/13 0416  NA 142  --  140  K 4.3  --  3.8  CL 105  --  103  CO2 23  --  24  GLUCOSE 111*  --  112*  BUN 10  --  7  CREATININE 0.74 0.72 0.67  CALCIUM 9.5  --  8.9  MG  --   --  1.9   Liver Function Tests:  Recent  Labs Lab 08/11/13 1610 08/12/13 0416  AST 33 22  ALT 29 22  ALKPHOS 41 45  BILITOT 0.7 0.6  PROT 7.6 6.5  ALBUMIN 4.2 3.7   No results found for this basename: LIPASE, AMYLASE,  in the last 168 hours No results found for this basename: AMMONIA,  in the last 168 hours CBC:  Recent Labs Lab 08/11/13 1610 08/11/13 1950 08/12/13 0416  WBC 8.5 10.1 14.4*  NEUTROABS 5.3  --  8.3*  HGB 10.7* 10.1* 9.7*  HCT 29.3* 28.0* 27.7*  MCV 95.8 97.2 96.5  PLT 497* 473* 470*   Cardiac Enzymes: No results found for this basename: CKTOTAL, CKMB, CKMBINDEX, TROPONINI,  in the last 168 hours BNP (last 3 results) No results found for this basename: PROBNP,  in the last 8760 hours CBG: No results found for this basename: GLUCAP,  in the last 168 hours  No results found for this or any previous visit (from the past 240 hour(s)).   Studies: No results found.  Scheduled Meds: . cholecalciferol  1,000 Units Oral Daily  . docusate sodium  100 mg Oral BID  . folic acid  1 mg Oral Daily  . folic acid  1 mg Oral Daily  .  gabapentin  300 mg Oral BID  . heparin  5,000 Units Subcutaneous 3 times per day  . HYDROmorphone PCA 0.3 mg/mL   Intravenous 6 times per day  . hydroxyurea  1,500 mg Oral Daily  . ketorolac  15 mg Intravenous 4 times per day  . methadone  5 mg Oral QHS  . multivitamin with minerals  1 tablet Oral Daily  . vitamin C  500 mg Oral Daily   Continuous Infusions: . dextrose 5 % and 0.45% NaCl 125 mL/hr at 08/12/13 8115   Active Problems:   Hb-SS disease with crisis   Back pain, acute   Sickle cell crisis   Sickle cell anemia with pain  [facility] Pager [phone]. If 7PM-7AM, please contact night-coverage at [email or web address], password [facility] 08/12/2013, 8:43 AM  LOS: 1 day

## 2013-08-12 NOTE — H&P (Signed)
Pt was seen and examined.  I agree with assessment and plan.   Gerlene Fee, MD, CDE, [place], [place]

## 2013-08-13 DIAGNOSIS — G894 Chronic pain syndrome: Secondary | ICD-10-CM | POA: Diagnosis not present

## 2013-08-13 DIAGNOSIS — M549 Dorsalgia, unspecified: Secondary | ICD-10-CM | POA: Diagnosis not present

## 2013-08-13 DIAGNOSIS — D5700 Hb-SS disease with crisis, unspecified: Secondary | ICD-10-CM | POA: Diagnosis not present

## 2013-08-13 LAB — CBC
HEMATOCRIT: 26.8 % — AB (ref 39.0–52.0)
Hemoglobin: 9.8 g/dL — ABNORMAL LOW (ref 13.0–17.0)
MCH: 34.9 pg — ABNORMAL HIGH (ref 26.0–34.0)
MCHC: 36.6 g/dL — ABNORMAL HIGH (ref 30.0–36.0)
MCV: 95.4 fL (ref 78.0–100.0)
PLATELETS: 472 10*3/uL — AB (ref 150–400)
RBC: 2.81 MIL/uL — AB (ref 4.22–5.81)
RDW: 15.6 % — ABNORMAL HIGH (ref 11.5–15.5)
WBC: 11.3 10*3/uL — AB (ref 4.0–10.5)

## 2013-08-13 NOTE — Progress Notes (Signed)
PHYSICIAN PROGRESS NOTE  Alejandro Soto ZOX:[id number] DOB: 02-Jan-1986 DOA: 08/11/2013  08/13/2013   PCP: MATTHEWS,MICHELLE A., MD  Assessment/Plan: 1. Acute Vaso-Occlusive SS Crisis - Pt reports that pain better controlled but still relying heavily on PCA at this time. He says that he has snoring and been told that he stops breathing while sleeping sometimes. He will need to set up for an outpatient sleep study as this may be contributing to vaso-occlusion. Pt received 24.82 mg dilaudid with 48 demands, 36 deliveries in last 24 hours.  Continuing toradol for inflammation.   Continue home hydrea and folic acid. Try ambulating more today and incentive spirometry. Pt eating better will decrease IVFs today.  2. Leukocytosis - no clinical signs of infection, likely this is related to acute crisis.   3. Constipation - pt had 2 large BMs yesterday. Continue stool softeners.  4. Concern for possible OSA - Recommending patient get outpatient sleep study.   Code Status: FULL  Family Communication: no family at bedside  Disposition Plan: not ready for discharge  HPI/Subjective: Pt reports that he had 2 large BMs yesterday.  Pt reporting that pain is 7/10 this morning.   Objective: Filed Vitals:   08/13/13 0536  BP: 117/67  Pulse: 64  Temp: 97.9 F (36.6 C)  Resp: 16    Intake/Output Summary (Last 24 hours) at 08/13/13 0915 Last data filed at 08/13/13 0617  Gross per 24 hour  Intake 2928.54 ml  Output      0 ml  Net 2928.54 ml   Filed Weights   08/11/13 1842 08/12/13 0620 08/13/13 0536  Weight: 143 lb 9.6 oz (65.137 kg) 145 lb 11.2 oz (66.089 kg) 157 lb 3 oz (71.3 kg)   Exam: General: Awake, alert, no distress  Cardiovascular: normal s1, s2 sounds  Respiratory: BBS clear to auscultation  Abdomen: soft, nondistended, nontender, no masses, normal BS  Musculoskeletal: no CCE   Data Reviewed: Basic Metabolic Panel:  Recent Labs Lab 08/11/13 1610 08/11/13 1950 08/12/13 0416  NA 142   --  140  K 4.3  --  3.8  CL 105  --  103  CO2 23  --  24  GLUCOSE 111*  --  112*  BUN 10  --  7  CREATININE 0.74 0.72 0.67  CALCIUM 9.5  --  8.9  MG  --   --  1.9   Liver Function Tests:  Recent Labs Lab 08/11/13 1610 08/12/13 0416  AST 33 22  ALT 29 22  ALKPHOS 41 45  BILITOT 0.7 0.6  PROT 7.6 6.5  ALBUMIN 4.2 3.7   No results found for this basename: LIPASE, AMYLASE,  in the last 168 hours No results found for this basename: AMMONIA,  in the last 168 hours CBC:  Recent Labs Lab 08/11/13 1610 08/11/13 1950 08/12/13 0416 08/13/13 0415  WBC 8.5 10.1 14.4* 11.3*  NEUTROABS 5.3  --  8.3*  --   HGB 10.7* 10.1* 9.7* 9.8*  HCT 29.3* 28.0* 27.7* 26.8*  MCV 95.8 97.2 96.5 95.4  PLT 497* 473* 470* 472*   Cardiac Enzymes: No results found for this basename: CKTOTAL, CKMB, CKMBINDEX, TROPONINI,  in the last 168 hours BNP (last 3 results) No results found for this basename: PROBNP,  in the last 8760 hours CBG: No results found for this basename: GLUCAP,  in the last 168 hours  No results found for this or any previous visit (from the past 240 hour(s)).   Studies: Dg Chest 2 View  08/12/2013   CLINICAL DATA:  Sickle cell disease, crisis, leukocytosis  EXAM: CHEST  2 VIEW  COMPARISON:  08/02/2013  FINDINGS: Minimal enlargement of cardiac silhouette.  Mediastinal contours and pulmonary vascularity normal.  Slight decreased lung was versus previous exam with minimal atelectasis at bases.  No definite acute infiltrate, pleural effusion or pneumothorax.  Bones stable.  IMPRESSION: Minimal enlargement of cardiac silhouette and bibasilar atelectasis.   Electronically Signed   By: Lavonia Dana M.D.   On: 08/12/2013 16:51   Scheduled Meds: . cholecalciferol  1,000 Units Oral Daily  . docusate sodium  100 mg Oral BID  . folic acid  1 mg Oral Daily  . gabapentin  300 mg Oral BID  . heparin  5,000 Units Subcutaneous 3 times per day  . HYDROmorphone PCA 0.3 mg/mL   Intravenous 6  times per day  . hydroxyurea  1,500 mg Oral Daily  . ketorolac  15 mg Intravenous 4 times per day  . methadone  5 mg Oral QHS  . multivitamin with minerals  1 tablet Oral Daily  . senna  1 tablet Oral Daily  . vitamin C  500 mg Oral Daily   Continuous Infusions: . dextrose 5 % and 0.45% NaCl 100 mL/hr at 08/13/13 5638   Active Problems:   Hb-SS disease with crisis   Back pain, acute   Sickle cell crisis   Sickle cell anemia with pain  [facility] Pager [phone]. If 7PM-7AM, please contact night-coverage at [email or web address], password [facility] 08/13/2013, 9:15 AM  LOS: 2 days

## 2013-08-14 ENCOUNTER — Ambulatory Visit: Payer: Medicare Other | Admitting: Internal Medicine

## 2013-08-14 ENCOUNTER — Other Ambulatory Visit: Payer: Self-pay | Admitting: Family Medicine

## 2013-08-14 DIAGNOSIS — D5700 Hb-SS disease with crisis, unspecified: Secondary | ICD-10-CM | POA: Diagnosis not present

## 2013-08-14 DIAGNOSIS — G894 Chronic pain syndrome: Secondary | ICD-10-CM | POA: Diagnosis not present

## 2013-08-14 DIAGNOSIS — M549 Dorsalgia, unspecified: Secondary | ICD-10-CM | POA: Diagnosis not present

## 2013-08-14 LAB — CBC
HCT: 25.5 % — ABNORMAL LOW (ref 39.0–52.0)
HEMOGLOBIN: 9.3 g/dL — AB (ref 13.0–17.0)
MCH: 35.2 pg — AB (ref 26.0–34.0)
MCHC: 36.5 g/dL — ABNORMAL HIGH (ref 30.0–36.0)
MCV: 96.6 fL (ref 78.0–100.0)
Platelets: 443 10*3/uL — ABNORMAL HIGH (ref 150–400)
RBC: 2.64 MIL/uL — ABNORMAL LOW (ref 4.22–5.81)
RDW: 15.8 % — ABNORMAL HIGH (ref 11.5–15.5)
WBC: 10.9 10*3/uL — ABNORMAL HIGH (ref 4.0–10.5)

## 2013-08-14 LAB — COMPREHENSIVE METABOLIC PANEL
ALT: 36 U/L (ref 0–53)
AST: 42 U/L — ABNORMAL HIGH (ref 0–37)
Albumin: 3.5 g/dL (ref 3.5–5.2)
Alkaline Phosphatase: 59 U/L (ref 39–117)
BUN: 6 mg/dL (ref 6–23)
CALCIUM: 8.6 mg/dL (ref 8.4–10.5)
CO2: 25 meq/L (ref 19–32)
CREATININE: 0.62 mg/dL (ref 0.50–1.35)
Chloride: 103 mEq/L (ref 96–112)
GLUCOSE: 103 mg/dL — AB (ref 70–99)
Potassium: 3.7 mEq/L (ref 3.7–5.3)
Sodium: 140 mEq/L (ref 137–147)
Total Bilirubin: 0.4 mg/dL (ref 0.3–1.2)
Total Protein: 6.3 g/dL (ref 6.0–8.3)

## 2013-08-14 MED ORDER — METHADONE HCL 5 MG PO TABS: 5.0000 mg | ORAL_TABLET | Freq: Every day | ORAL | Status: DC

## 2013-08-14 MED ORDER — OXYCODONE HCL 15 MG PO TABS: 15.0000 mg | ORAL_TABLET | ORAL | Status: DC | PRN

## 2013-08-14 NOTE — Telephone Encounter (Signed)
Prescription filled for Oxycodone and Methadone and given to patient at time of discharge from hospital. Pt needs to follow-up in clinic to discuss smoking cessation and starting chantix.

## 2013-08-14 NOTE — Discharge Instructions (Signed)
Chronic Pain Chronic pain can be defined as pain that is off and on and lasts for 3 6 months or longer. Many things cause chronic pain, which can make it difficult to make a diagnosis. There are many treatment options available for chronic pain. However, finding a treatment that works well for you may require trying various approaches until the right one is found. Many people benefit from a combination of two or more types of treatment to control their pain. SYMPTOMS  Chronic pain can occur anywhere in the body and can range from mild to very severe. Some types of chronic pain include:  Headache.  Low back pain.  Cancer pain.  Arthritis pain.  Neurogenic pain. This is pain resulting from damage to nerves. People with chronic pain may also have other symptoms such as:  Depression.  Anger.  Insomnia.  Anxiety. DIAGNOSIS  Your health care provider will help diagnose your condition over time. In many cases, the initial focus will be on excluding possible conditions that could be causing the pain. Depending on your symptoms, your health care provider may order tests to diagnose your condition. Some of these tests may include:   Blood tests.   CT scan.   MRI.   X-rays.   Ultrasounds.   Nerve conduction studies.  You may need to see a specialist.  TREATMENT  Finding treatment that works well may take time. You may be referred to a pain specialist. He or she may prescribe medicine or therapies, such as:   Mindful meditation or yoga.  Shots (injections) of numbing or pain-relieving medicines into the spine or area of pain.  Local electrical stimulation.  Acupuncture.   Massage therapy.   Aroma, color, light, or sound therapy.   Biofeedback.   Working with a physical therapist to keep from getting stiff.   Regular, gentle exercise.   Cognitive or behavioral therapy.   Group support.  Sometimes, surgery may be recommended.  HOME CARE INSTRUCTIONS    Take all medicines as directed by your health care provider.   Lessen stress in your life by relaxing and doing things such as listening to calming music.   Exercise or be active as directed by your health care provider.   Eat a healthy diet and include things such as vegetables, fruits, fish, and lean meats in your diet.   Keep all follow-up appointments with your health care provider.   Attend a support group with others suffering from chronic pain. SEEK MEDICAL CARE IF:   Your pain gets worse.   You develop a new pain that was not there before.   You cannot tolerate medicines given to you by your health care provider.   You have new symptoms since your last visit with your health care provider.  SEEK IMMEDIATE MEDICAL CARE IF:   You feel weak.   You have decreased sensation or numbness.   You lose control of bowel or bladder function.   Your pain suddenly gets much worse.   You develop shaking.  You develop chills.  You develop confusion.  You develop chest pain.  You develop shortness of breath.  MAKE SURE YOU:  Understand these instructions.  Will watch your condition.  Will get help right away if you are not doing well or get worse. Document Released: 02/28/2002 Document Revised: 02/08/2013 Document Reviewed: 12/02/2012 [facility] Patient Information 2014 [place].  Sickle Cell Anemia, Adult Sickle cell anemia is a condition where your red blood cells are shaped like sickles.  Red blood cells carry oxygen through the body. Sickle-shaped red blood cells cells do not live as long as normal red blood cells. They also clump together and block blood from flowing through the blood vessels. These things prevent the body from getting enough oxygen. Sickle cell anemia causes organ damage and pain. It also increases the risk of infection. HOME CARE  Drink enough fluid to keep your pee (urine) clear or pale yellow. Drink more in hot weather and  during exercise.  Do not smoke. Smoking lowers oxygen levels in the blood.  Only take over-the-counter or prescription medicines as told by your doctor.  Take antibiotic medicines as told by your doctor. Make sure you finish them it even if you start to feel better.  Take supplements as told by your doctor.  Consider wearing a medical alert bracelet. This tells anyone caring for you in an emergency of your condition.  When traveling, keep your medical information, doctors' names, and the medicines you take with you at all times.  If you have a fever, do not take fever medicines right away. This could cover up a problem. Tell your doctor.   Keep all follow-up appointments with your doctor. Sickle cell anemia requires regular medical care. GET HELP IF: You have a fever. GET HELP RIGHT AWAY IF:  You feel dizzy or faint.  You have new belly (abdominal) pain, especially on the left side near the stomach area.  You have a lasting, often uncomfortable and painful erection of the penis (priapism). If it is not treated right away, you will become unable to have sex (impotence).  You have numbness your arms or legs or you have a hard time moving them.  You have a hard time talking.  You have a fever or lasting symptoms for more than 2 3 days.  You have a fever and your symptoms suddenly get worse.  You have signs or symptoms of infection. These include:  Chills.  Being more tired than normal (lethargy).  Irritability.  Poor eating.  Throwing up (vomiting).  You have pain that is not helped with medicine.  You have shortness of breath.  You have pain in your chest.  You are coughing up pus-like or bloody mucus.  You have a stiff neck.  Your feet or hands swell or have pain.  Your belly looks bloated.  Your joints hurt. MAKE SURE YOU:  Understand these instructions.  Will watch your condition.  Will get help right away if you are not doing well or get  worse. Document Released: 03/29/2013 Document Reviewed: 01/18/2013 [facility] Patient Information 2014 [place].  Sleep Apnea Sleep apnea is disorder that affects a person's sleep. A person with sleep apnea has abnormal pauses in their breathing when they sleep. It is hard for them to get a good sleep. This makes a person tired during the day. It also can lead to other physical problems. There are three types of sleep apnea. One type is when breathing stops for a short time because your airway is blocked (obstructive sleep apnea). Another type is when the brain sometimes fails to give the normal signal to breathe to the muscles that control your breathing (central sleep apnea). The third type is a combination of the other two types. HOME CARE  Do not sleep on your back. Try to sleep on your side.  Take all medicine as told by your doctor.  Avoid alcohol, calming medicines (sedatives), and depressant drugs.  Try to lose weight if  you are overweight. Talk to your doctor about a healthy weight goal. Your doctor may have you use a device that helps to open your airway. It can help you get the air that you need. It is called a positive airway pressure (PAP) device. There are three types of PAP devices:  Continuous positive airway pressure (CPAP) device.  Nasal expiratory positive airway pressure (EPAP) device.  Bilevel positive airway pressure (BPAP) device. MAKE SURE YOU:  Understand these instructions.  Will watch your condition.  Will get help right away if you are not doing well or get worse. Document Released: 03/17/2008 Document Revised: 05/25/2012 Document Reviewed: 10/10/2011 [facility] Patient Information 2014 [place].

## 2013-08-14 NOTE — Discharge Summary (Signed)
PHYSICIAN DISCHARGE SUMMARY   Alejandro Soto ASN:[id number] DOB: [birth year] DOA: 08/11/2013  PCP: MATTHEWS,MICHELLE A., MD  Admit date: 08/11/2013 Discharge date: 08/14/2013  Time spent: 34 minutes  Recommendations for Outpatient Follow-up:  1. Please consider arranging outpatient sleep study evaluation 2. Please check CBC and BMP on followup appointment   Discharge Diagnoses:  Active Problems:   Hb-SS disease with crisis   Back pain, acute   Sickle cell crisis   Sickle cell anemia with pain  Discharge Condition: stable   Diet recommendation: regular   Filed Weights   08/12/13 0620 08/13/13 0536 08/14/13 0500  Weight: 145 lb 11.2 oz (66.089 kg) 157 lb 3 oz (71.3 kg) 149 lb 7.6 oz (67.8 kg)    History of present illness:  28 year old male with sickle cell anemia Hgb SS presents with acute pain to lower back, which is consistent with previous sickle cell crisis. Patient states that 8-10 pain to lower back started 2 days ago. Patient states that he took Oxycodone 15 mg as previously prescribed at 4 am with minimal relief. Patient states that he consistently takes prescribed medications. Attributes sickle cell crisis to current weather conditions. Patient denies nausea, vomiting, or diarrhea.  Patient was seen for extended observation at [place]. Patient was started on IVF and weight based rapid re-dosing per protocol. Pain intensity remained greater than 7/10. Weight-based PCA dilaudid was utilized while under extended observation, patient used a total of 10.49 mg with 27 demands and 21 deliveries. Patient stated that he is unable to manage current pain intensity, which is 7-8/10 prior to transfer.   Hospital Course:  Acute Vaso-Occlusive SS Crisis - Resolved now and patient able to manage pain at home.  He has been ambulating well in the halls.  He has been talking on the telephone.   He says that he has snoring and been told that he stops breathing while sleeping  sometimes. He will need to set up for an outpatient sleep study as this may be contributing to vaso-occlusion.  Continue home hydrea and folic acid.   Leukocytosis - no clinical signs of infection, likely this is related to acute crisis.   Constipation - pt had 2 large BMs. Continue stool softeners.   Concern for possible OSA - Recommending patient get outpatient sleep study.    Discharge Exam: Pt is awake, alert, ambulating halls, no distress, cooperative, he has some back pain but confident that he can manage at home with his home pain medications.  Filed Vitals:   08/14/13 1006  BP: 114/63  Pulse: 81  Temp:   Resp: 16   General: awake, alert, no apparent distress Cardiovascular: normal s1, s2 sounds  Respiratory: BBS clear to auscultation Abd: soft, nondistended, nontender, no masses Ext: no CCE   Discharge Instructions  Discharge Orders   Future Appointments Provider Department Dept Phone   09/25/2013 9:00 AM [facility] [place] [phone]   10/02/2013 1:30 PM Leana Gamer, MD [place] [phone]   Future Orders Complete By Expires   Discharge instructions  As directed    Comments:     Return if symptoms recur, worsen or new problems develop. Ask your primary physician to arrange an outpatient sleep study.   Discontinue IV  As directed    Increase activity slowly  As directed        Medication List         cholecalciferol 1000 UNITS tablet  Commonly known as:  VITAMIN D  Take 1,000 Units by mouth daily.     folic acid 1 MG tablet  Commonly known as:  FOLVITE  Take 1 mg by mouth daily.     gabapentin 300 MG capsule  Commonly known as:  NEURONTIN  Take 1 capsule (300 mg total) by mouth 2 (two) times daily.     hydroxyurea 500 MG capsule  Commonly known as:  HYDREA  Take 1,500 mg by mouth daily. May take with food to minimize GI side effects.     ibuprofen 600 MG tablet  Commonly known as:  ADVIL,MOTRIN   Take 600 mg by mouth as needed for pain.     methadone 5 MG tablet  Commonly known as:  DOLOPHINE  Take 1 tablet (5 mg total) by mouth at bedtime.     multivitamin tablet  Take 1 tablet by mouth daily.     oxyCODONE 15 MG immediate release tablet  Commonly known as:  ROXICODONE  Take 1 tablet (15 mg total) by mouth every 4 (four) hours as needed for pain. Do Not Fill before 08/03/2013.     vitamin C 500 MG tablet  Commonly known as:  ASCORBIC ACID  Take 500 mg by mouth daily.       Allergies  Allergen Reactions  . Amoxicillin Diarrhea  . Morphine And Related Other (See Comments)    Made his stomach hurt       Follow-up Information   Follow up with MATTHEWS,MICHELLE A., MD. Schedule an appointment as soon as possible for a visit in 3 days. [facility] Follow up )    Specialty:  Internal Medicine   Contact information:   [place] [address] [phone]       The results of significant diagnostics from this hospitalization (including imaging, microbiology, ancillary and laboratory) are listed below for reference.    Significant Diagnostic Studies: Dg Chest 2 View  08/12/2013   CLINICAL DATA:  Sickle cell disease, crisis, leukocytosis  EXAM: CHEST  2 VIEW  COMPARISON:  08/02/2013  FINDINGS: Minimal enlargement of cardiac silhouette.  Mediastinal contours and pulmonary vascularity normal.  Slight decreased lung was versus previous exam with minimal atelectasis at bases.  No definite acute infiltrate, pleural effusion or pneumothorax.  Bones stable.  IMPRESSION: Minimal enlargement of cardiac silhouette and bibasilar atelectasis.   Electronically Signed   By: Lavonia Dana M.D.   On: 08/12/2013 16:51    Microbiology: No results found for this or any previous visit (from the past 240 hour(s)).   Labs: Basic Metabolic Panel:  Recent Labs Lab 08/11/13 1610 08/11/13 1950 08/12/13 0416 08/14/13 0350  NA 142  --  140 140  K 4.3  --  3.8 3.7  CL 105   --  103 103  CO2 23  --  24 25  GLUCOSE 111*  --  112* 103*  BUN 10  --  7 6  CREATININE 0.74 0.72 0.67 0.62  CALCIUM 9.5  --  8.9 8.6  MG  --   --  1.9  --    Liver Function Tests:  Recent Labs Lab 08/11/13 1610 08/12/13 0416 08/14/13 0350  AST 33 22 42*  ALT 29 22 36  ALKPHOS 41 45 59  BILITOT 0.7 0.6 0.4  PROT 7.6 6.5 6.3  ALBUMIN 4.2 3.7 3.5   No results found for this basename: LIPASE, AMYLASE,  in the last 168 hours No results found for this  basename: AMMONIA,  in the last 168 hours CBC:  Recent Labs Lab 08/11/13 1610 08/11/13 1950 08/12/13 0416 08/13/13 0415 08/14/13 0350  WBC 8.5 10.1 14.4* 11.3* 10.9*  NEUTROABS 5.3  --  8.3*  --   --   HGB 10.7* 10.1* 9.7* 9.8* 9.3*  HCT 29.3* 28.0* 27.7* 26.8* 25.5*  MCV 95.8 97.2 96.5 95.4 96.6  PLT 497* 473* 470* 472* 443*   Cardiac Enzymes: No results found for this basename: CKTOTAL, CKMB, CKMBINDEX, TROPONINI,  in the last 168 hours BNP: BNP (last 3 results) No results found for this basename: PROBNP,  in the last 8760 hours CBG: No results found for this basename: GLUCAP,  in the last 168 hours  Signed:  Clanford Johnson  [facility] 08/14/2013, 10:30 AM

## 2013-08-22 ENCOUNTER — Ambulatory Visit (INDEPENDENT_AMBULATORY_CARE_PROVIDER_SITE_OTHER): Payer: Medicare Other | Admitting: Family Medicine

## 2013-08-22 ENCOUNTER — Encounter: Payer: Self-pay | Admitting: Family Medicine

## 2013-08-22 VITALS — BP 112/71 | HR 79 | Temp 98.6°F | Resp 20 | Ht 67.0 in | Wt 146.0 lb

## 2013-08-22 DIAGNOSIS — D571 Sickle-cell disease without crisis: Secondary | ICD-10-CM | POA: Diagnosis not present

## 2013-08-22 NOTE — Progress Notes (Signed)
   Subjective:    Patient ID: Alejandro Soto, male    DOB: 05-22-1986, 28 y.o.   MRN: [id number]  HPI Patient in office for a follow-up from hospital visit. Patient states that he is managing pain as well as he can at home. Patient states that current pain intensity is 7/10 pain to lower back with is consistent with sickle cell pain. Patient reports that he took prescribed medications at 0900, with minimal relief. Reports that recent cold weather has been exacerbating pain.    Review of Systems  Constitutional: Negative.   HENT: Negative.   Eyes: Negative.   Respiratory: Negative.   Cardiovascular: Negative.   Gastrointestinal: Negative.   Endocrine: Negative.   Genitourinary: Negative.   Musculoskeletal: Positive for back pain.  Skin: Negative.   Neurological: Negative.   Hematological: Negative.   Psychiatric/Behavioral: Negative.        Objective:   Physical Exam  Constitutional: He appears well-developed and well-nourished.  HENT:  Head: Normocephalic and atraumatic.  Eyes: Conjunctivae are normal. Pupils are equal, round, and reactive to light.          Assessment & Plan:  1. Hgb SS: Patient states that he is taking Hydrea and folic acid consistently.Sickle cell disease Hb SS - Continue Hydrea 1,500 mg daily. We discussed the need for good hydration, monitoring of hydration status, avoidance of heat, cold, stress, and infection triggers. We discussed the risks and benefits of Hydrea. The patient was reminded of the need to seek medical attention of any symptoms of bleeding, anemia, or infection. Continue folic acid 1 mg daily to prevent aplastic bone marrow crises. Patient expressed understanding. Acute and chronic painful episodes - Pt is currently using Oxycodone 15 mg 180 pills per month and Methadone 5 mg 30 pills per month. Advised to continue medication regimen as scheduled. Patient reports that he has been trying to remain hydrated, patient has cut down on soda and  juice.    2. Tobacco dependence: Patient is a non smoker, he recently quit on 08/10/2013 but reports that he is often exposed to cigarette smoke through friends. Discussed the effects of secondhand smoke and sickle cell anemia.   RTC: 3 month follow with Dr. Zigmund Daniel Labs 1 week prior to follow up

## 2013-08-24 ENCOUNTER — Telehealth: Payer: Self-pay | Admitting: Internal Medicine

## 2013-08-24 DIAGNOSIS — D571 Sickle-cell disease without crisis: Secondary | ICD-10-CM

## 2013-08-24 DIAGNOSIS — F17200 Nicotine dependence, unspecified, uncomplicated: Secondary | ICD-10-CM | POA: Diagnosis not present

## 2013-08-24 DIAGNOSIS — M545 Low back pain, unspecified: Secondary | ICD-10-CM | POA: Diagnosis not present

## 2013-08-24 DIAGNOSIS — D5700 Hb-SS disease with crisis, unspecified: Secondary | ICD-10-CM | POA: Diagnosis not present

## 2013-08-24 DIAGNOSIS — Z79899 Other long term (current) drug therapy: Secondary | ICD-10-CM | POA: Diagnosis not present

## 2013-08-24 DIAGNOSIS — G894 Chronic pain syndrome: Secondary | ICD-10-CM

## 2013-08-24 MED ORDER — OXYCODONE HCL 15 MG PO TABS: 15.0000 mg | ORAL_TABLET | ORAL | Status: DC | PRN

## 2013-08-24 NOTE — Telephone Encounter (Signed)
Refill request for oxyCODONE (ROXICODONE) 15 MG immediate release tablet  Last office visit 08/22/2013 Last RX for oyxcodone given 08/14/13 at discharge

## 2013-08-24 NOTE — Telephone Encounter (Signed)
Oxycodone 15 mg every 4 hours as needed for pain. #90 tablets. Do not refill before 08/29/2013.

## 2013-08-25 DIAGNOSIS — Z79899 Other long term (current) drug therapy: Secondary | ICD-10-CM | POA: Diagnosis not present

## 2013-08-25 DIAGNOSIS — M545 Low back pain, unspecified: Secondary | ICD-10-CM | POA: Diagnosis not present

## 2013-08-25 DIAGNOSIS — D5700 Hb-SS disease with crisis, unspecified: Secondary | ICD-10-CM | POA: Diagnosis not present

## 2013-08-25 DIAGNOSIS — F17200 Nicotine dependence, unspecified, uncomplicated: Secondary | ICD-10-CM | POA: Diagnosis not present

## 2013-08-26 DIAGNOSIS — M545 Low back pain, unspecified: Secondary | ICD-10-CM | POA: Diagnosis not present

## 2013-08-26 DIAGNOSIS — D5700 Hb-SS disease with crisis, unspecified: Secondary | ICD-10-CM | POA: Diagnosis not present

## 2013-08-26 DIAGNOSIS — F17200 Nicotine dependence, unspecified, uncomplicated: Secondary | ICD-10-CM | POA: Diagnosis not present

## 2013-08-26 DIAGNOSIS — Z79899 Other long term (current) drug therapy: Secondary | ICD-10-CM | POA: Diagnosis not present

## 2013-08-27 DIAGNOSIS — D72829 Elevated white blood cell count, unspecified: Secondary | ICD-10-CM | POA: Diagnosis not present

## 2013-08-27 DIAGNOSIS — F17200 Nicotine dependence, unspecified, uncomplicated: Secondary | ICD-10-CM | POA: Diagnosis not present

## 2013-08-27 DIAGNOSIS — Z79899 Other long term (current) drug therapy: Secondary | ICD-10-CM | POA: Diagnosis not present

## 2013-08-27 DIAGNOSIS — M545 Low back pain, unspecified: Secondary | ICD-10-CM | POA: Diagnosis not present

## 2013-08-27 DIAGNOSIS — D5700 Hb-SS disease with crisis, unspecified: Secondary | ICD-10-CM | POA: Diagnosis not present

## 2013-08-27 DIAGNOSIS — D649 Anemia, unspecified: Secondary | ICD-10-CM | POA: Diagnosis not present

## 2013-08-28 ENCOUNTER — Telehealth: Payer: Self-pay | Admitting: Internal Medicine

## 2013-08-28 DIAGNOSIS — Z79899 Other long term (current) drug therapy: Secondary | ICD-10-CM | POA: Diagnosis not present

## 2013-08-28 DIAGNOSIS — M545 Low back pain, unspecified: Secondary | ICD-10-CM | POA: Diagnosis not present

## 2013-08-28 DIAGNOSIS — D5700 Hb-SS disease with crisis, unspecified: Secondary | ICD-10-CM | POA: Diagnosis not present

## 2013-08-28 DIAGNOSIS — J189 Pneumonia, unspecified organism: Secondary | ICD-10-CM | POA: Diagnosis not present

## 2013-08-28 DIAGNOSIS — F17200 Nicotine dependence, unspecified, uncomplicated: Secondary | ICD-10-CM | POA: Diagnosis not present
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
## 2013-08-29 DIAGNOSIS — M545 Low back pain, unspecified: Secondary | ICD-10-CM | POA: Diagnosis not present

## 2013-08-29 DIAGNOSIS — Z79899 Other long term (current) drug therapy: Secondary | ICD-10-CM | POA: Diagnosis not present

## 2013-08-29 DIAGNOSIS — D5700 Hb-SS disease with crisis, unspecified: Secondary | ICD-10-CM | POA: Diagnosis not present

## 2013-08-29 DIAGNOSIS — F17200 Nicotine dependence, unspecified, uncomplicated: Secondary | ICD-10-CM | POA: Diagnosis not present

## 2013-09-05 ENCOUNTER — Telehealth: Payer: Self-pay | Admitting: Internal Medicine

## 2013-09-05 MED ORDER — IBUPROFEN 600 MG PO TABS
600.0000 mg | ORAL_TABLET | ORAL | Status: DC | PRN
Start: 2013-09-05 — End: 2013-09-07

## 2013-09-05 MED ORDER — GABAPENTIN 300 MG PO CAPS: 300.0000 mg | ORAL_CAPSULE | Freq: Two times a day (BID) | ORAL | Status: DC

## 2013-09-05 NOTE — Telephone Encounter (Signed)
Prescription refill for Ibuprofen 600 mg with 1 refill and Gabapentin 300mg  cap with 2 refills; spoke with pt. And notified him that prescriptions would be electronically sent to [facility]

## 2013-09-07 ENCOUNTER — Encounter: Payer: Self-pay | Admitting: Internal Medicine

## 2013-09-07 ENCOUNTER — Ambulatory Visit (INDEPENDENT_AMBULATORY_CARE_PROVIDER_SITE_OTHER): Payer: Medicare Other | Admitting: Internal Medicine

## 2013-09-07 VITALS — BP 124/73 | HR 93 | Temp 98.3°F | Resp 20 | Ht 67.0 in | Wt 151.0 lb

## 2013-09-07 DIAGNOSIS — G894 Chronic pain syndrome: Secondary | ICD-10-CM

## 2013-09-07 DIAGNOSIS — M5137 Other intervertebral disc degeneration, lumbosacral region: Secondary | ICD-10-CM | POA: Diagnosis not present

## 2013-09-07 DIAGNOSIS — R7309 Other abnormal glucose: Secondary | ICD-10-CM | POA: Diagnosis not present

## 2013-09-07 DIAGNOSIS — E559 Vitamin D deficiency, unspecified: Secondary | ICD-10-CM | POA: Diagnosis not present

## 2013-09-07 DIAGNOSIS — M792 Neuralgia and neuritis, unspecified: Secondary | ICD-10-CM

## 2013-09-07 DIAGNOSIS — D5700 Hb-SS disease with crisis, unspecified: Secondary | ICD-10-CM | POA: Diagnosis not present

## 2013-09-07 DIAGNOSIS — IMO0002 Reserved for concepts with insufficient information to code with codable children: Secondary | ICD-10-CM

## 2013-09-07 DIAGNOSIS — M47817 Spondylosis without myelopathy or radiculopathy, lumbosacral region: Secondary | ICD-10-CM

## 2013-09-07 DIAGNOSIS — R739 Hyperglycemia, unspecified: Secondary | ICD-10-CM

## 2013-09-07 DIAGNOSIS — D571 Sickle-cell disease without crisis: Secondary | ICD-10-CM

## 2013-09-07 MED ORDER — METHADONE HCL 5 MG PO TABS: 5.0000 mg | ORAL_TABLET | Freq: Every day | ORAL | Status: DC

## 2013-09-07 MED ORDER — GABAPENTIN 300 MG PO CAPS: 300.0000 mg | ORAL_CAPSULE | Freq: Two times a day (BID) | ORAL | Status: DC

## 2013-09-07 MED ORDER — OXYCODONE HCL 15 MG PO TABS: 15.0000 mg | ORAL_TABLET | ORAL | Status: DC | PRN

## 2013-09-07 MED ORDER — IBUPROFEN 600 MG PO TABS: 600.0000 mg | ORAL_TABLET | ORAL | Status: DC | PRN

## 2013-09-07 MED ORDER — VITAMIN D 1000 UNITS PO TABS: 3000.0000 [IU] | ORAL_TABLET | Freq: Every day | ORAL | Status: DC

## 2013-09-07 NOTE — Progress Notes (Signed)
Subjective:    Patient ID: Alejandro Soto, male    DOB: 1985-12-08, 28 y.o.   MRN: [id number]  HPI: Pt here today for post-hospital follow up. He states that he has pain in his back that is still not controlled by his current analgesics. I have discussed with patient the report of his L-S x-ray done 11/2012 which shows mild DJD at level L4-5. Pt had been involved with lifting heavy objects and playing basketball with is a high impact activity. Pt states that when he is in crisis this is his most vulnerable area of pain, and even when he is not in crisis he has pain in this region. He describes his pain as aching and sometimes sharp in nature. He has no radiation of pain and th pain ranges in intensity from 2-5/10 which is manageable with oral medications. He has not been using NSAId's as advised.   Currently he rates pain as 2/10 and localized to his low back.  I also discussed with patient the call that I received from [facility] on his last hospitalization there and the inconsistencies with his reported medications (they were not aware that he was taking Methadone daily) and his girlfriend's attempts to obtain a prescription for Dilaudid when he had recently received a prescription for Oxycodone. I reviewed his pain contract and pt acknowledged his embarrassment at his girlfriend's behavior and also his understanding of the pain contract.     Review of Systems  Constitutional: Negative.   HENT: Negative.   Eyes: Negative.   Respiratory: Negative.   Cardiovascular: Negative.   Gastrointestinal: Negative.   Endocrine: Negative.   Genitourinary: Negative.   Musculoskeletal: Positive for arthralgias and myalgias.  Skin: Negative.   Allergic/Immunologic: Negative.   Neurological: Negative.   Hematological: Negative.   Psychiatric/Behavioral: Negative.        Objective:   Physical Exam  Constitutional: He is oriented to person, place, and time. He appears well-developed and  well-nourished.  HENT:  Head: Atraumatic.  Eyes: Conjunctivae and EOM are normal. Pupils are equal, round, and reactive to light. No scleral icterus.  Neck: Normal range of motion. Neck supple.  Cardiovascular: Normal rate and regular rhythm.  Exam reveals no gallop and no friction rub.   No murmur heard. Pulmonary/Chest: Effort normal and breath sounds normal. He has no wheezes. He has no rales. He exhibits no tenderness.  Abdominal: Soft. Bowel sounds are normal. He exhibits no mass.  Musculoskeletal: Normal range of motion.  Neurological: He is alert and oriented to person, place, and time.  Skin: Skin is warm and dry.  Psychiatric: He has a normal mood and affect. His behavior is normal. Judgment and thought content normal.          Assessment & Plan:  1. Hb SS: Pt has pain which is not entirely consistent with pain of Sickle cell disease. He likely has some pain associated with DJD of the L-S vertebra. Will recheck x-ray of L-S spine as pain is more intense that last year.  2. Hb SS: Pt to continue current dose of Oxycodone adn Methadone. I have re-ordered Ibuprofen and advised patient to use for milder pain and in conjunction with Oxycodone in event of crisis. Use tbe limited to no more than 15 days/month.  -continue Folic acid and Hydrea. Pt has a Hb F of greater than 30% which likely indicates Hb SS with HPFH.  3. Hyperglycemia: Review of labs from [facility] shows repeated values of  hyperglycemia. Will perform Hb A1c to evaluate for diabetes.   4. Vitamin D supplementation: Pt has been on long standing Vitamin D supplementation. Will check vitamin D levels.  5.  Medication management: Narcotic contract reviewed and patient verbalizes understanding and agreement to adherence. Will continue current management and increase use of NSAID's. Pt also to continue Gabapentin for management of Neuropathic component of pain.

## 2013-09-08 LAB — HEMOGLOBIN A1C

## 2013-09-11 ENCOUNTER — Encounter: Payer: Self-pay | Admitting: Internal Medicine

## 2013-09-18 DIAGNOSIS — M47817 Spondylosis without myelopathy or radiculopathy, lumbosacral region: Secondary | ICD-10-CM

## 2013-09-18 DIAGNOSIS — M792 Neuralgia and neuritis, unspecified: Secondary | ICD-10-CM

## 2013-09-18 DIAGNOSIS — E559 Vitamin D deficiency, unspecified: Secondary | ICD-10-CM | POA: Insufficient documentation

## 2013-09-18 DIAGNOSIS — R739 Hyperglycemia, unspecified: Secondary | ICD-10-CM | POA: Insufficient documentation

## 2013-09-18 HISTORY — DX: Spondylosis without myelopathy or radiculopathy, lumbosacral region: M47.817

## 2013-09-18 HISTORY — DX: Neuralgia and neuritis, unspecified: M79.2

## 2013-09-25 ENCOUNTER — Other Ambulatory Visit: Payer: Medicare Other

## 2013-09-25 ENCOUNTER — Telehealth: Payer: Self-pay | Admitting: Internal Medicine

## 2013-09-25 DIAGNOSIS — G894 Chronic pain syndrome: Secondary | ICD-10-CM

## 2013-09-25 DIAGNOSIS — D571 Sickle-cell disease without crisis: Secondary | ICD-10-CM

## 2013-09-25 MED ORDER — OXYCODONE HCL 15 MG PO TABS: 15.0000 mg | ORAL_TABLET | ORAL | Status: DC | PRN

## 2013-09-25 NOTE — Telephone Encounter (Signed)
Prescription written for Oxycodone 15 mg #90 to be used q 4 hours as needed for pain.

## 2013-10-01 DIAGNOSIS — M545 Low back pain, unspecified: Secondary | ICD-10-CM | POA: Diagnosis not present

## 2013-10-01 DIAGNOSIS — Z79899 Other long term (current) drug therapy: Secondary | ICD-10-CM | POA: Diagnosis not present

## 2013-10-01 DIAGNOSIS — I10 Essential (primary) hypertension: Secondary | ICD-10-CM | POA: Diagnosis not present

## 2013-10-01 DIAGNOSIS — D571 Sickle-cell disease without crisis: Secondary | ICD-10-CM | POA: Diagnosis not present

## 2013-10-01 DIAGNOSIS — M549 Dorsalgia, unspecified: Secondary | ICD-10-CM | POA: Diagnosis not present

## 2013-10-02 ENCOUNTER — Ambulatory Visit: Payer: Medicare Other | Admitting: Internal Medicine

## 2013-10-05 ENCOUNTER — Telehealth: Payer: Self-pay | Admitting: Internal Medicine

## 2013-10-05 DIAGNOSIS — G894 Chronic pain syndrome: Secondary | ICD-10-CM

## 2013-10-05 DIAGNOSIS — D5700 Hb-SS disease with crisis, unspecified: Secondary | ICD-10-CM

## 2013-10-05 DIAGNOSIS — D571 Sickle-cell disease without crisis: Secondary | ICD-10-CM

## 2013-10-05 NOTE — Telephone Encounter (Signed)
Medication refill request for oxyCODONE (ROXICODONE) 15 MG immediate release tablet,methadone (DOLOPHINE) 5 MG tablet

## 2013-10-06 NOTE — Telephone Encounter (Signed)
error 

## 2013-10-08 DIAGNOSIS — D571 Sickle-cell disease without crisis: Secondary | ICD-10-CM | POA: Diagnosis not present

## 2013-10-08 DIAGNOSIS — Z791 Long term (current) use of non-steroidal anti-inflammatories (NSAID): Secondary | ICD-10-CM | POA: Diagnosis not present

## 2013-10-08 DIAGNOSIS — D57819 Other sickle-cell disorders with crisis, unspecified: Secondary | ICD-10-CM | POA: Diagnosis not present

## 2013-10-08 DIAGNOSIS — M549 Dorsalgia, unspecified: Secondary | ICD-10-CM | POA: Diagnosis not present

## 2013-10-08 DIAGNOSIS — Z79899 Other long term (current) drug therapy: Secondary | ICD-10-CM | POA: Diagnosis not present

## 2013-10-08 DIAGNOSIS — Z885 Allergy status to narcotic agent status: Secondary | ICD-10-CM | POA: Diagnosis not present

## 2013-10-08 DIAGNOSIS — R109 Unspecified abdominal pain: Secondary | ICD-10-CM | POA: Diagnosis not present

## 2013-10-09 MED ORDER — OXYCODONE HCL 15 MG PO TABS: 15.0000 mg | ORAL_TABLET | ORAL | Status: DC | PRN

## 2013-10-09 MED ORDER — METHADONE HCL 5 MG PO TABS: 5.0000 mg | ORAL_TABLET | Freq: Every day | ORAL | Status: DC

## 2013-10-09 NOTE — Telephone Encounter (Signed)
Medication refill request for oxyCODONE (ROXICODONE) 15 MG immediate release tablet,methadone (DOLOPHINE) 5 MG tablet  

## 2013-10-09 NOTE — Telephone Encounter (Signed)
Reordered: Methadone 5 mg nightly #30 tablets. Rx not to be filled before 10/12/2013 Oxycodone 15 mg every 4 hours as needed for severe pain #90

## 2013-10-10 ENCOUNTER — Telehealth (HOSPITAL_COMMUNITY): Payer: Self-pay | Admitting: *Deleted

## 2013-10-10 NOTE — Telephone Encounter (Signed)
Received call from [facility] to clarify refill date for medication refill. Informed pharmacist to follow current orders at this time and this RN will contact MD to clarify as soon as possible. Pharmacist acknowledges.

## 2013-10-10 NOTE — Telephone Encounter (Signed)
Returned call to [facility] outpatient pharmacy, informed pharmacist that he may fill prescription refill today. Pharmacist acknowledged. Contacted patient and notified patient that he can get prescription refilled today, Patient acknowledged.

## 2013-10-19 ENCOUNTER — Telehealth: Payer: Self-pay | Admitting: Internal Medicine

## 2013-10-19 DIAGNOSIS — G894 Chronic pain syndrome: Secondary | ICD-10-CM

## 2013-10-19 DIAGNOSIS — M47817 Spondylosis without myelopathy or radiculopathy, lumbosacral region: Secondary | ICD-10-CM

## 2013-10-19 DIAGNOSIS — D571 Sickle-cell disease without crisis: Secondary | ICD-10-CM

## 2013-10-19 MED ORDER — IBUPROFEN 600 MG PO TABS: 600.0000 mg | ORAL_TABLET | ORAL | Status: DC | PRN

## 2013-10-19 NOTE — Telephone Encounter (Signed)
Ibuprofen 600mg  PO Take 1 tablet (600 mg total) by mouth as needed. - Oral E-Prescribing Status: Receipt confirmed by pharmacy (10/19/2013 4:25 PM EDT)

## 2013-10-20 MED ORDER — OXYCODONE HCL 15 MG PO TABS: 15.0000 mg | ORAL_TABLET | ORAL | Status: DC | PRN

## 2013-10-20 NOTE — Telephone Encounter (Addendum)
Prescription refilled for Oxycodone 15 mg #90 tabs which is due t be refilled on 10/24/2013. Also spoke with patient and made him aware that I received a query from [facility] about his opiate use. The patient has a opiate contract with this practice and I have reinforced the contract and that he is to receive his narcotics onlyy from this office and to use the designated Pharmacy.

## 2013-10-29 DIAGNOSIS — Z888 Allergy status to other drugs, medicaments and biological substances status: Secondary | ICD-10-CM | POA: Diagnosis not present

## 2013-10-29 DIAGNOSIS — D571 Sickle-cell disease without crisis: Secondary | ICD-10-CM | POA: Diagnosis not present

## 2013-10-29 DIAGNOSIS — M549 Dorsalgia, unspecified: Secondary | ICD-10-CM | POA: Diagnosis not present

## 2013-10-29 DIAGNOSIS — Z79899 Other long term (current) drug therapy: Secondary | ICD-10-CM | POA: Diagnosis not present

## 2013-11-02 ENCOUNTER — Telehealth: Payer: Self-pay | Admitting: Internal Medicine

## 2013-11-02 DIAGNOSIS — G894 Chronic pain syndrome: Secondary | ICD-10-CM

## 2013-11-02 DIAGNOSIS — D5700 Hb-SS disease with crisis, unspecified: Secondary | ICD-10-CM

## 2013-11-02 DIAGNOSIS — D571 Sickle-cell disease without crisis: Secondary | ICD-10-CM

## 2013-11-05 DIAGNOSIS — M549 Dorsalgia, unspecified: Secondary | ICD-10-CM | POA: Diagnosis not present

## 2013-11-05 DIAGNOSIS — M545 Low back pain, unspecified: Secondary | ICD-10-CM | POA: Diagnosis not present

## 2013-11-07 MED ORDER — OXYCODONE HCL 15 MG PO TABS: 15.0000 mg | ORAL_TABLET | ORAL | Status: DC | PRN

## 2013-11-07 MED ORDER — METHADONE HCL 5 MG PO TABS: 5.0000 mg | ORAL_TABLET | Freq: Every day | ORAL | Status: DC

## 2013-11-07 NOTE — Telephone Encounter (Signed)
Reordered Methadone 5 mg at bedtime #30.  Oxycodone 15 mg every 4 hours as needed for severe pain #90   Reviewed [place] Substance Reporting system prior to reorder

## 2013-11-14 DIAGNOSIS — Z79899 Other long term (current) drug therapy: Secondary | ICD-10-CM | POA: Diagnosis not present

## 2013-11-14 DIAGNOSIS — M79609 Pain in unspecified limb: Secondary | ICD-10-CM | POA: Diagnosis not present

## 2013-11-14 DIAGNOSIS — M722 Plantar fascial fibromatosis: Secondary | ICD-10-CM | POA: Diagnosis not present

## 2013-11-15 ENCOUNTER — Encounter (HOSPITAL_COMMUNITY): Payer: Self-pay | Admitting: Hematology

## 2013-11-15 ENCOUNTER — Other Ambulatory Visit: Payer: Medicare Other

## 2013-11-15 ENCOUNTER — Non-Acute Institutional Stay (HOSPITAL_COMMUNITY)
Admission: AD | Admit: 2013-11-15 | Discharge: 2013-11-15 | Disposition: A | Discharge status: Home / Self Care | Payer: Medicare Other | Attending: Internal Medicine | Admitting: Internal Medicine

## 2013-11-15 ENCOUNTER — Telehealth (HOSPITAL_COMMUNITY): Payer: Self-pay | Admitting: Hematology

## 2013-11-15 DIAGNOSIS — F17200 Nicotine dependence, unspecified, uncomplicated: Secondary | ICD-10-CM | POA: Diagnosis not present

## 2013-11-15 DIAGNOSIS — Z79899 Other long term (current) drug therapy: Secondary | ICD-10-CM | POA: Insufficient documentation

## 2013-11-15 DIAGNOSIS — D5700 Hb-SS disease with crisis, unspecified: Secondary | ICD-10-CM | POA: Diagnosis not present

## 2013-11-15 DIAGNOSIS — Z885 Allergy status to narcotic agent status: Secondary | ICD-10-CM | POA: Diagnosis not present

## 2013-11-15 DIAGNOSIS — D571 Sickle-cell disease without crisis: Secondary | ICD-10-CM

## 2013-11-15 DIAGNOSIS — Z880 Allergy status to penicillin: Secondary | ICD-10-CM | POA: Diagnosis not present

## 2013-11-15 LAB — CBC WITH DIFFERENTIAL/PLATELET
BASOS ABS: 0 10*3/uL (ref 0.0–0.1)
Basophils Relative: 1 % (ref 0–1)
Eosinophils Absolute: 0 10*3/uL (ref 0.0–0.7)
Eosinophils Relative: 1 % (ref 0–5)
HCT: 29.6 % — ABNORMAL LOW (ref 39.0–52.0)
Hemoglobin: 10.7 g/dL — ABNORMAL LOW (ref 13.0–17.0)
Lymphocytes Relative: 26 % (ref 12–46)
Lymphs Abs: 1.7 10*3/uL (ref 0.7–4.0)
MCH: 36 pg — ABNORMAL HIGH (ref 26.0–34.0)
MCHC: 36.1 g/dL — ABNORMAL HIGH (ref 30.0–36.0)
MCV: 99.7 fL (ref 78.0–100.0)
Monocytes Absolute: 0.9 10*3/uL (ref 0.1–1.0)
Monocytes Relative: 14 % — ABNORMAL HIGH (ref 3–12)
Neutro Abs: 3.9 10*3/uL (ref 1.7–7.7)
Neutrophils Relative %: 58 % (ref 43–77)
PLATELETS: 302 10*3/uL (ref 150–400)
RBC: 2.97 MIL/uL — ABNORMAL LOW (ref 4.22–5.81)
RDW: 16.8 % — AB (ref 11.5–15.5)
WBC: 6.6 10*3/uL (ref 4.0–10.5)

## 2013-11-15 LAB — COMPREHENSIVE METABOLIC PANEL
ALT: 35 U/L (ref 0–53)
AST: 77 U/L — AB (ref 0–37)
Albumin: 4.3 g/dL (ref 3.5–5.2)
Alkaline Phosphatase: 51 U/L (ref 39–117)
BUN: 10 mg/dL (ref 6–23)
CALCIUM: 9.7 mg/dL (ref 8.4–10.5)
CO2: 23 meq/L (ref 19–32)
Chloride: 102 mEq/L (ref 96–112)
Creatinine, Ser: 0.7 mg/dL (ref 0.50–1.35)
GFR calc Af Amer: >90 mL/min (ref >90)
Glucose, Bld: 106 mg/dL — ABNORMAL HIGH (ref 70–99)
Potassium: 4.1 mEq/L (ref 3.7–5.3)
SODIUM: 141 meq/L (ref 137–147)
TOTAL PROTEIN: 7.8 g/dL (ref 6.0–8.3)
Total Bilirubin: 0.4 mg/dL (ref 0.3–1.2)

## 2013-11-15 MED ORDER — OXYCODONE HCL 5 MG PO TABS
15.0000 mg | ORAL_TABLET | Freq: Once | ORAL | Status: AC
Start: 2013-11-15 — End: 2013-11-15
  Administered 2013-11-15: 15 mg via ORAL
  Filled 2013-11-15: qty 3

## 2013-11-15 MED ORDER — HYDROMORPHONE HCL PF 2 MG/ML IJ SOLN
2.0000 mg | Freq: Once | INTRAMUSCULAR | Status: AC
  Administered 2013-11-15: 2 mg via INTRAVENOUS
  Filled 2013-11-15: qty 1

## 2013-11-15 MED ORDER — KETOROLAC TROMETHAMINE 30 MG/ML IJ SOLN
30.0000 mg | Freq: Once | INTRAMUSCULAR | Status: AC
  Administered 2013-11-15: 30 mg via INTRAVENOUS
  Filled 2013-11-15: qty 1

## 2013-11-15 MED ORDER — HYDROMORPHONE HCL PF 2 MG/ML IJ SOLN
2.5000 mg | Freq: Once | INTRAMUSCULAR | Status: AC
  Administered 2013-11-15: 2.5 mg via INTRAVENOUS
  Filled 2013-11-15: qty 2

## 2013-11-15 MED ORDER — DEXTROSE-NACL 5-0.45 % IV SOLN
INTRAVENOUS | Status: DC
  Administered 2013-11-15: 12:00:00 via INTRAVENOUS

## 2013-11-15 MED ORDER — HYDROMORPHONE HCL PF 2 MG/ML IJ SOLN
1.6000 mg | Freq: Once | INTRAMUSCULAR | Status: AC
  Administered 2013-11-15: 1.6 mg via INTRAVENOUS
  Filled 2013-11-15: qty 1

## 2013-11-15 NOTE — Telephone Encounter (Signed)
Patient came in to office to have labs drawn.  Labs drawn per order.  At that time, patient C/O of generalized pain and pain to right foot that 8/10 in nature.  Patient states this has been getting worse over several days.  He has taken his pain medication per order without any relief.  Patient requesting to come to [facility].  I explained that I would notify the physician.  Patient verbalizes understanding.

## 2013-11-15 NOTE — Discharge Instructions (Signed)
Sickle Cell Anemia, Adult °Sickle cell anemia is a condition in which red blood cells have an abnormal "sickle" shape. This abnormal shape shortens the cells' life span, which results in a lower than normal concentration of red blood cells in the blood. The sickle shape also causes the cells to clump together and block free blood flow through the blood vessels. As a result, the tissues and organs of the body do not receive enough oxygen. Sickle cell anemia causes organ damage and pain and increases the risk of infection. °CAUSES  °Sickle cell anemia is a genetic disorder. Those who receive two copies of the gene have the condition, and those who receive one copy have the trait. °RISK FACTORS °The sickle cell gene is most common in people whose families originated in [place]. Other areas of the globe where sickle cell trait occurs include the Mediterranean, [place] and [place], the [place], and the [place].  °SIGNS AND SYMPTOMS °· Pain, especially in the extremities, back, chest, or abdomen (common). The pain may start suddenly or may develop following an illness, especially if there is dehydration. Pain can also occur due to overexertion or exposure to extreme temperature changes. °· Frequent severe bacterial infections, especially certain types of pneumonia and meningitis. °· Pain and swelling in the hands and feet. °· Decreased activity.   °· Loss of appetite.   °· Change in behavior. °· Headaches. °· Seizures. °· Shortness of breath or difficulty breathing. °· Vision changes. °· Skin ulcers. °Those with the trait may not have symptoms or they may have mild symptoms.  °DIAGNOSIS  °Sickle cell anemia is diagnosed with blood tests that demonstrate the genetic trait. It is often diagnosed during the newborn period, due to mandatory testing nationwide. A variety of blood tests, X-rays, CT scans, MRI scans, ultrasounds, and lung function tests may also be done to monitor the condition. °TREATMENT  °Sickle  cell anemia may be treated with: °· Medicines. You may be given pain medicines, antibiotic medicines (to treat and prevent infections) or medicines to increase the production of certain types of hemoglobin. °· Fluids. °· Oxygen. °· Blood transfusions. °HOME CARE INSTRUCTIONS  °· Drink enough fluid to keep your urine clear or pale yellow. Increase your fluid intake in hot weather and during exercise. °· Do not smoke. Smoking lowers oxygen levels in the blood.   °· Only take over-the-counter or prescription medicines for pain, fever, or discomfort as directed by your health care provider. °· Take antibiotics as directed by your health care provider. Make sure you finish them it even if you start to feel better.   °· Take supplements as directed by your health care provider.   °· Consider wearing a medical alert bracelet. This tells anyone caring for you in an emergency of your condition.   °· When traveling, keep your medical information, health care provider's names, and the medicines you take with you at all times.   °· If you develop a fever, do not take medicines to reduce the fever right away. This could cover up a problem that is developing. Notify your health care provider. °· Keep all follow-up appointments with your health care provider. Sickle cell anemia requires regular medical care. °SEEK MEDICAL CARE IF: ° You have a fever. °SEEK IMMEDIATE MEDICAL CARE IF:  °· You feel dizzy or faint.   °· You have new abdominal pain, especially on the left side near the stomach area.   °· You develop a persistent, often uncomfortable and painful penile erection (priapism). If this is not treated immediately it   will lead to impotence.   °· You have numbness your arms or legs or you have a hard time moving them.   °· You have a hard time with speech.   °· You have a fever or persistent symptoms for more than 2 3 days.   °· You have a fever and your symptoms suddenly get worse.   °· You have signs or symptoms of infection.  These include:   °· Chills.   °· Abnormal tiredness (lethargy).   °· Irritability.   °· Poor eating.   °· Vomiting.   °· You develop pain that is not helped with medicine.   °· You develop shortness of breath. °· You have pain in your chest.   °· You are coughing up pus-like or bloody sputum.   °· You develop a stiff neck. °· Your feet or hands swell or have pain. °· Your abdomen appears bloated. °· You develop joint pain. °MAKE SURE YOU: °· Understand these instructions. °· Will watch your child's condition. °· Will get help right away if your child is not doing well or gets worse. °Document Released: 09/16/2005 Document Revised: 03/29/2013 Document Reviewed: 01/18/2013 °ExitCare® Patient Information ©2014 [employer]. ° °

## 2013-11-15 NOTE — H&P (Signed)
[place] History and Physical  Alejandro Soto JOI:[id number] DOB: [birth year] DOA: 11/15/2013   PCP: MATTHEWS,MICHELLE A., MD   Chief Complaint: Sickle cell pain  HPI: 28 year old male with a history of sickle cell anemia, HbSS presents with generalized pain. He states that pain all over, but  is primarily in his right heel with ambulation. He attributes crisis to not taking medications. Patient reports that pain increased initially 5 days ago when he ran out of Methadone 5 mg taken at bedtime. Patient reports that prescription was at the pharmacy, however; he did not have transportation to pick it up . Current pain intensity is 8/10 described at constant and throbbing. Pain was unrelieved by Oxycodone 15 mg last taken this am. Patient denies dizziness, shortness of breath, nausea, vomiting, or diarrhea.    Review of Systems:  Constitutional: No weight loss, night sweats, Fevers, chills, fatigue.  HEENT: No headaches, dizziness, seizures, vision changes, difficulty swallowing,Tooth/dental problems,Sore throat, No sneezing, itching, ear ache, nasal congestion, post nasal drip,  Cardio-vascular: No chest pain, Orthopnea, PND, swelling in lower extremities, anasarca, dizziness, palpitations  GI: No heartburn, indigestion, abdominal pain, nausea, vomiting, diarrhea, change in bowel habits, loss of appetite  Resp: No shortness of breath with exertion or at rest. No excess mucus, no productive cough, No non-productive cough, No coughing up of blood.No change in color of mucus.No wheezing.No chest wall deformity  Skin: no rash or lesions.  GU: no dysuria, change in color of urine, no urgency or frequency. No flank pain.  Musculoskeletal: Generalized myalgia. No decreased range of motion. Back pain.  Psych: No change in mood or affect. No depression or anxiety. No memory loss.    Past Medical History  Diagnosis Date  . Sickle cell anemia   . Pneumonia    Past Surgical History   Procedure Laterality Date  . No past surgeries     Social History:  reports that he has been smoking Cigarettes.  He has been smoking about 0.00 packs per day for the past 5 years. He has never used smokeless tobacco. He reports that he does not drink alcohol or use illicit drugs.  Allergies  Allergen Reactions  . Amoxicillin Diarrhea  . Morphine And Related Other (See Comments)    Made his stomach hurt    Family History  Problem Relation Age of Onset  . Diabetes Father   . Cancer Father 108    Prostate  . Asthma Brother   . Cancer Maternal Grandmother     colon     Prior to Admission medications   Medication Sig Start Date End Date Taking? Authorizing Provider  cholecalciferol (VITAMIN D) 1000 UNITS tablet Take 3 tablets (3,000 Units total) by mouth daily. 09/07/13  Yes Leana Gamer, MD  folic acid (FOLVITE) 1 MG tablet Take 1 mg by mouth daily.   Yes Historical Provider, MD  gabapentin (NEURONTIN) 300 MG capsule Take 1 capsule (300 mg total) by mouth 2 (two) times daily. 09/07/13  Yes Leana Gamer, MD  hydroxyurea (HYDREA) 500 MG capsule Take 1,500 mg by mouth daily. May take with food to minimize GI side effects.   Yes Historical Provider, MD  ibuprofen (ADVIL,MOTRIN) 600 MG tablet Take 1 tablet (600 mg total) by mouth as needed. 10/19/13  Yes Leana Gamer, MD  methadone (DOLOPHINE) 5 MG tablet Take 1 tablet (5 mg total) by mouth at bedtime. 11/07/13  Yes Dorena Dew, FNP  Multiple Vitamin (MULTIVITAMIN) tablet Take  1 tablet by mouth daily.   Yes Historical Provider, MD  oxyCODONE (ROXICODONE) 15 MG immediate release tablet Take 1 tablet (15 mg total) by mouth every 4 (four) hours as needed for pain. 11/07/13  Yes Dorena Dew, FNP  vitamin C (ASCORBIC ACID) 500 MG tablet Take 500 mg by mouth daily.   Yes Historical Provider, MD   Physical Exam: Filed Vitals:   11/15/13 1110  BP: 93/70  Pulse: 88  Temp: 98.8 F (37.1 C)  TempSrc: Oral  Resp: 20   Height: 5\' 8"  (1.727 m)  Weight: 145 lb (65.772 kg)  SpO2: 100%   BP 93/70  Pulse 88  Temp(Src) 98.8 F (37.1 C) (Oral)  Resp 20  Ht 5\' 8"  (1.727 m)  Wt 145 lb (65.772 kg)  BMI 22.05 kg/m2  SpO2 100% General appearance: alert, cooperative, appears stated age and mild distress Head: Normocephalic, without obvious abnormality, atraumatic Eyes: conjunctivae/corneas clear. PERRL, EOM's intact. Fundi benign. Ears: normal TM's and external ear canals both ears Nose: Nares normal. Septum midline. Mucosa normal. No drainage or sinus tenderness. Throat: lips, mucosa, and tongue normal; teeth and gums normal Neck: no adenopathy, no carotid bruit, no JVD, supple, symmetrical, trachea midline and thyroid not enlarged, symmetric, no tenderness/mass/nodules Back: symmetric, no curvature. ROM normal. No CVA tenderness. Lungs: clear to auscultation bilaterally Chest wall: no tenderness Heart: regular rate and rhythm, S1, S2 normal, no murmur, click, rub or gallop Abdomen: soft, non-tender; bowel sounds normal; no masses,  no organomegaly Extremities: extremities normal, atraumatic, no cyanosis or edema Pulses: 2+ and symmetric Skin: Skin color, texture, turgor normal. No rashes or lesions Lymph nodes: Cervical, supraclavicular, and axillary nodes normal. Neurologic: Alert and oriented X 3, normal strength and tone. Normal symmetric reflexes. Normal coordination and gait  Labs on Admission:   Basic Metabolic Panel:  Recent Labs Lab 11/15/13 1120  NA 141  K 4.1  CL 102  CO2 23  GLUCOSE 106*  BUN 10  CREATININE 0.70  CALCIUM 9.7   Liver Function Tests:  Recent Labs Lab 11/15/13 1120  AST 77*  ALT 35  ALKPHOS 51  BILITOT 0.4  PROT 7.8  ALBUMIN 4.3   CBC:  Recent Labs Lab 11/15/13 1120  WBC 6.6  NEUTROABS 3.9  HGB 10.7*  HCT 29.6*  MCV 99.7  PLT 302   BNP: No components found with this basename: POCBNP,    Radiological Exams on Admission: No results  found.  EKG: Independently reviewed.   Assessment/Plan: Active Problems:   Hb-SS disease with crisis  1. Sickle cell pain: Patient admitted to day hospital for extended observation.  Start hypotonic IV fluid for cellular rehydration. Will start IV Toradol and IV dilaudid per weight based rapid re-dosing. Will re-evaluate pain intensity post IV dilaudid.  Apply K-pad to right heel Use incentive spirometer hourly while awake Will check CBCw/differential, CMP, and reticulocyte count   Time spend: 35 minutes Code Status: Full Family Communication: None Disposition Plan: Home when stable  Dorena Dew, FNP Pager [phone]  If 7PM-7AM, please contact night-coverage [email or web address] Password [email or web address] 11/15/2013, 2:06 PM

## 2013-11-15 NOTE — Progress Notes (Signed)
Patient ID: Alejandro Soto, male   DOB: 1985/11/27, 28 y.o.   MRN: [id number] Discharge instructions given to patient along with follow up information.  IV removed without difficulty.  Patient encouraged to pick up prescriptions from pharmacy.  Patient verbalizes understanding.  Patient stating pain is now a 4.

## 2013-11-15 NOTE — Discharge Summary (Signed)
Physician Discharge Summary  Dagon Budai GGY:[id number] DOB: [birth year] DOA: 11/15/2013  PCP: Leana Gamer., MD  Admit date: 11/15/2013 Discharge date: 11/15/2013  Discharge Diagnoses:  Active Problems:   Hb-SS disease with crisis   Discharge Condition: Stable  Disposition:  Follow-up Information   Follow up with MATTHEWS,MICHELLE A., MD. (As needed, If symptoms worsen)    Specialty:  Internal Medicine   Contact information:   [place] [address] [phone]       Diet:Regular Wt Readings from Last 3 Encounters:  11/15/13 145 lb (65.772 kg)  09/07/13 151 lb (68.493 kg)  08/22/13 146 lb (66.225 kg)    History of present illness:  28 year old male with a history of sickle cell anemia, HbSS presents with generalized pain. He states that pain all over, but is primarily in his right heel with ambulation. He attributes crisis to not taking medications. Patient reports that pain increased initially 5 days ago when he ran out of Methadone 5 mg taken at bedtime. Patient reports that prescription was at the pharmacy, however; he did not have transportation to pick it up . Current pain intensity is 8/10 described at constant and throbbing. Pain was unrelieved by Oxycodone 15 mg last taken this am. Patient denies dizziness, shortness of breath, nausea, vomiting, or diarrhea.    Hospital Course:  Patient was admitted to day hospital for extended observation. Started hypotonic IVFs for cellular rehydration. Was given IV Toradol times 1 dose and IV dilaudid per weight based rapid re-dosing times 3 doses. Pain intensity decreased to 4/10. Patient reports that he can manage at home at current pain intensity. He states that he has transportation to pick up his medications from the pharmacy. Patient was given Oxycodone 15 mg po prior to discharge, pain intensity remained 4/10. Recommend that patient drink 64 ounces of water and continue maintenance medications. Also,  patient to follow up with Dr. Zigmund Daniel as scheduled.   Discharge Exam:  Filed Vitals:   11/15/13 1110  BP: 93/70  Pulse: 88  Temp: 98.8 F (37.1 C)  Resp: 20   Filed Vitals:   11/15/13 1110  BP: 93/70  Pulse: 88  Temp: 98.8 F (37.1 C)  TempSrc: Oral  Resp: 20  Height: 5\' 8"  (1.727 m)  Weight: 145 lb (65.772 kg)  SpO2: 100%   BP 93/70  Pulse 88  Temp(Src) 98.8 F (37.1 C) (Oral)  Resp 20  Ht 5\' 8"  (1.727 m)  Wt 145 lb (65.772 kg)  BMI 22.05 kg/m2  SpO2 100% General appearance: alert, cooperative and appears stated age Head: Normocephalic, without obvious abnormality, atraumatic Back: symmetric, no curvature. ROM normal. No CVA tenderness. Lungs: clear to auscultation bilaterally Chest wall: no tenderness Abdomen: soft, non-tender; bowel sounds normal; no masses,  no organomegaly Extremities: extremities normal, atraumatic, no cyanosis or edema Pulses: 2+ and symmetric Lymph nodes: Cervical, supraclavicular, and axillary nodes normal. Discharge Instructions     Medication List         cholecalciferol 1000 UNITS tablet  Commonly known as:  VITAMIN D  Take 3 tablets (3,000 Units total) by mouth daily.     folic acid 1 MG tablet  Commonly known as:  FOLVITE  Take 1 mg by mouth daily.     gabapentin 300 MG capsule  Commonly known as:  NEURONTIN  Take 1 capsule (300 mg total) by mouth 2 (two) times daily.     hydroxyurea 500 MG capsule  Commonly known as:  HYDREA  Take 1,500 mg by mouth daily. May take with food to minimize GI side effects.     ibuprofen 600 MG tablet  Commonly known as:  ADVIL,MOTRIN  Take 1 tablet (600 mg total) by mouth as needed.     methadone 5 MG tablet  Commonly known as:  DOLOPHINE  Take 1 tablet (5 mg total) by mouth at bedtime.     multivitamin tablet  Take 1 tablet by mouth daily.     oxyCODONE 15 MG immediate release tablet  Commonly known as:  ROXICODONE  Take 1 tablet (15 mg total) by mouth every 4 (four) hours as  needed for pain.     vitamin C 500 MG tablet  Commonly known as:  ASCORBIC ACID  Take 500 mg by mouth daily.          The results of significant diagnostics from this hospitalization (including imaging, microbiology, ancillary and laboratory) are listed below for reference.    Significant Diagnostic Studies: No results found.  Microbiology: No results found for this or any previous visit (from the past 240 hour(s)).   Labs: Basic Metabolic Panel:  Recent Labs Lab 11/15/13 1120  NA 141  K 4.1  CL 102  CO2 23  GLUCOSE 106*  BUN 10  CREATININE 0.70  CALCIUM 9.7   Liver Function Tests:  Recent Labs Lab 11/15/13 1120  AST 77*  ALT 35  ALKPHOS 51  BILITOT 0.4  PROT 7.8  ALBUMIN 4.3   No results found for this basename: LIPASE, AMYLASE,  in the last 168 hours No results found for this basename: AMMONIA,  in the last 168 hours CBC:  Recent Labs Lab 11/15/13 1120  WBC 6.6  NEUTROABS 3.9  HGB 10.7*  HCT 29.6*  MCV 99.7  PLT 302   Cardiac Enzymes: No results found for this basename: CKTOTAL, CKMB, CKMBINDEX, TROPONINI,  in the last 168 hours BNP: No components found with this basename: POCBNP,  CBG: No results found for this basename: GLUCAP,  in the last 168 hours Ferritin: No results found for this basename: FERRITIN,  in the last 168 hours  Time coordinating discharge: greater than 35 minutes Signed:  Dorena Dew  11/15/2013, 2:02 PM

## 2013-11-16 ENCOUNTER — Telehealth: Payer: Self-pay | Admitting: Internal Medicine

## 2013-11-16 DIAGNOSIS — D571 Sickle-cell disease without crisis: Secondary | ICD-10-CM

## 2013-11-16 DIAGNOSIS — G894 Chronic pain syndrome: Secondary | ICD-10-CM

## 2013-11-21 ENCOUNTER — Other Ambulatory Visit: Payer: Self-pay | Admitting: Internal Medicine

## 2013-11-21 DIAGNOSIS — G894 Chronic pain syndrome: Secondary | ICD-10-CM

## 2013-11-21 DIAGNOSIS — D571 Sickle-cell disease without crisis: Secondary | ICD-10-CM

## 2013-11-21 MED ORDER — OXYCODONE HCL 15 MG PO TABS: 15.0000 mg | ORAL_TABLET | ORAL | Status: DC | PRN

## 2013-11-21 NOTE — Progress Notes (Signed)
Prescription reprinted as did not print the 1st time.

## 2013-11-21 NOTE — Telephone Encounter (Signed)
Prescription refilled for oxycodone 15 mg #90 tabs.

## 2013-11-21 NOTE — Telephone Encounter (Signed)
Medication refill request for oxyCODONE (ROXICODONE) 15 MG immediate release tablet

## 2013-11-22 ENCOUNTER — Ambulatory Visit: Payer: Medicare Other | Admitting: Family Medicine

## 2013-11-23 NOTE — H&P (Signed)
Pt seen and examined and discussed with NP Cammie Sickle. Agree with assessment and plan.  Leana Gamer

## 2013-11-23 NOTE — Discharge Summary (Signed)
Pt seen and examined and discussed with NP Cammie Sickle. Agree with discharge home.  Leana Gamer

## 2013-11-27 ENCOUNTER — Ambulatory Visit (INDEPENDENT_AMBULATORY_CARE_PROVIDER_SITE_OTHER): Payer: Medicare Other | Admitting: Internal Medicine

## 2013-11-27 ENCOUNTER — Encounter: Payer: Self-pay | Admitting: Internal Medicine

## 2013-11-27 ENCOUNTER — Ambulatory Visit (HOSPITAL_COMMUNITY)
Admission: RE | Admit: 2013-11-27 | Discharge: 2013-11-27 | Disposition: A | Discharge status: Home / Self Care | Payer: Medicare Other | Source: Ambulatory Visit | Attending: Internal Medicine | Admitting: Internal Medicine

## 2013-11-27 VITALS — BP 135/65 | HR 72 | Temp 98.3°F | Resp 20 | Ht 68.0 in | Wt 142.0 lb

## 2013-11-27 DIAGNOSIS — M5137 Other intervertebral disc degeneration, lumbosacral region: Secondary | ICD-10-CM

## 2013-11-27 DIAGNOSIS — F17200 Nicotine dependence, unspecified, uncomplicated: Secondary | ICD-10-CM

## 2013-11-27 DIAGNOSIS — M545 Low back pain, unspecified: Secondary | ICD-10-CM | POA: Diagnosis not present

## 2013-11-27 DIAGNOSIS — M549 Dorsalgia, unspecified: Secondary | ICD-10-CM | POA: Diagnosis not present

## 2013-11-27 DIAGNOSIS — M47817 Spondylosis without myelopathy or radiculopathy, lumbosacral region: Secondary | ICD-10-CM

## 2013-11-27 NOTE — Progress Notes (Signed)
   Subjective:    Patient ID: Alejandro Soto, male    DOB: [birth year], 28 y.o.   MRN: [id number]  HPI: Pt here today for follow up after having been seen in the day Hospital for treatment of acute vaso-occlusive episode. Pt continue to complain of pain in the low back at the region of L4-L5. He describes the pain as sharp and continuous. He states that the pain is there upon awakening and persists throughout the day making it impossible to engage in any activity. He states that he has pain with any activity and is unable to identify any palliative factors. The pain has been a nagging pain present for several months and is not characteristic of his pain of SCD. He has no associated weakness or loss of bowel or bladder function. He denies any trauma. His last MRI from 03/09/2013 showed early DJD.   Pt also states that he recently had pain in his RLE which he is unable to describe. He states that the pain is improved and he did not present for care at the time of the pain in his RLE.  Review of Systems  Constitutional: Negative.   HENT: Negative.   Eyes: Negative.   Respiratory: Negative.   Cardiovascular: Negative.   Gastrointestinal: Negative.   Endocrine: Negative.   Genitourinary: Negative.   Musculoskeletal: Positive for arthralgias, back pain and myalgias.  Skin: Negative.   Allergic/Immunologic: Negative.   Neurological: Negative.   Hematological: Negative.   Psychiatric/Behavioral: Negative.        Objective:   Physical Exam  Vitals reviewed. Constitutional: He is oriented to person, place, and time. He appears well-developed and well-nourished.  HENT:  Head: Atraumatic.  Eyes: Conjunctivae and EOM are normal. Pupils are equal, round, and reactive to light. No scleral icterus.  Neck: Normal range of motion. Neck supple.  Cardiovascular: Normal rate and regular rhythm.  Exam reveals no gallop and no friction rub.   No murmur heard. Pulmonary/Chest: Effort normal and breath sounds  normal. He has no wheezes. He has no rales. He exhibits no tenderness.  Abdominal: Soft. Bowel sounds are normal. He exhibits no mass.  Musculoskeletal: Normal range of motion.  Pt able to perform trunk flexion, extension, rotation and lateral flexion without difficulty. He had normal range with those movements but did c/o pain throughout ROM. He had no increased pain with resistance to movements.   Neurological: He is alert and oriented to person, place, and time.  Skin: Skin is warm and dry.  Psychiatric: He has a normal mood and affect. His behavior is normal. Judgment and thought content normal.          Assessment & Plan:  1. Chronic Back Pain: Pt has chronic back pain unrelated to SCD. He states that Will refer to pain specialist. Pt will also obtain the lumbar X-ray that was ordered for him some tine ago.  2. Tobacco Use Disorder: Pt is now down to < 1/2 PPD. He does not want to try Chantix. He has called the smoking cessation line and is awaiting nicotine patches. Pt willing to try Zyban.  Labs: None  RTC: 3 months or PRN.  Keeshawn Fakhouri A.

## 2013-11-29 ENCOUNTER — Telehealth: Payer: Self-pay

## 2013-11-29 ENCOUNTER — Telehealth: Payer: Self-pay | Admitting: Internal Medicine

## 2013-11-29 DIAGNOSIS — G894 Chronic pain syndrome: Secondary | ICD-10-CM

## 2013-11-29 DIAGNOSIS — D571 Sickle-cell disease without crisis: Secondary | ICD-10-CM

## 2013-11-29 DIAGNOSIS — M549 Dorsalgia, unspecified: Principal | ICD-10-CM

## 2013-11-29 DIAGNOSIS — G8929 Other chronic pain: Secondary | ICD-10-CM

## 2013-11-29 MED ORDER — OXYCODONE HCL 15 MG PO TABS: 15.0000 mg | ORAL_TABLET | ORAL | Status: DC | PRN

## 2013-11-29 NOTE — Telephone Encounter (Signed)
Pt's referral has been sent over to [facility].Clinic will be contacting Pt when Paperwork has been reviewed/as well as with an Appointment Date and Time.

## 2013-11-29 NOTE — Telephone Encounter (Signed)
Prescription refilled for Oxycodone 15 mg #90 not to be filled before 12/04/2013.

## 2013-11-29 NOTE — Telephone Encounter (Signed)
Ok will make referral

## 2013-11-29 NOTE — Telephone Encounter (Signed)
Will make referral to [facility] as per patient request.

## 2013-11-29 NOTE — Telephone Encounter (Signed)
Patient called stating he has located a pain center to address his back pain, but needs a referral. Patient would like to be referred to [facility] [address] in [place].

## 2013-11-29 NOTE — Telephone Encounter (Signed)
Refill request for oxyCODONE (ROXICODONE) 15 MG immediate release tablet / LOV 11/27/13

## 2013-11-29 NOTE — Telephone Encounter (Signed)
Call Documentation      Glori Bickers at 11/29/2013 11:47 AM      Status: Signed            Patient called stating he has located a pain center to address his back pain, but needs a referral. Patient would like to be referred to [facility] [address] in [place] [phone].    Plz Advise. If ok with you I can place referral my self.Thanks

## 2013-11-30 DIAGNOSIS — Z79899 Other long term (current) drug therapy: Secondary | ICD-10-CM | POA: Diagnosis not present

## 2013-11-30 DIAGNOSIS — M545 Low back pain, unspecified: Secondary | ICD-10-CM | POA: Diagnosis not present

## 2013-11-30 DIAGNOSIS — M549 Dorsalgia, unspecified: Secondary | ICD-10-CM | POA: Diagnosis not present

## 2013-11-30 DIAGNOSIS — F17200 Nicotine dependence, unspecified, uncomplicated: Secondary | ICD-10-CM | POA: Diagnosis not present

## 2013-11-30 DIAGNOSIS — M79609 Pain in unspecified limb: Secondary | ICD-10-CM | POA: Diagnosis not present

## 2013-11-30 DIAGNOSIS — D571 Sickle-cell disease without crisis: Secondary | ICD-10-CM | POA: Diagnosis not present

## 2013-12-04 ENCOUNTER — Encounter: Payer: Self-pay | Admitting: Internal Medicine

## 2013-12-04 MED ORDER — BUPROPION HCL ER (SMOKING DET) 150 MG PO TB12: 150.0000 mg | ORAL_TABLET | Freq: Two times a day (BID) | ORAL | Status: DC

## 2013-12-08 ENCOUNTER — Ambulatory Visit: Payer: Medicare Other | Admitting: Family Medicine

## 2013-12-11 ENCOUNTER — Telehealth: Payer: Self-pay

## 2013-12-11 NOTE — Telephone Encounter (Signed)
Pt has been notified of Appointment w/ [facility] on 12/29/2013@9 :00A.M.VM was left W/ date as well as time.

## 2013-12-12 ENCOUNTER — Encounter (HOSPITAL_COMMUNITY): Payer: Self-pay | Admitting: Emergency Medicine

## 2013-12-12 ENCOUNTER — Emergency Department (HOSPITAL_COMMUNITY): Payer: Medicare Other

## 2013-12-12 ENCOUNTER — Non-Acute Institutional Stay (HOSPITAL_COMMUNITY)
Admission: EM | Admit: 2013-12-12 | Discharge: 2013-12-12 | Disposition: A | Discharge status: Home / Self Care | Payer: Medicare Other | Attending: Emergency Medicine | Admitting: Emergency Medicine

## 2013-12-12 DIAGNOSIS — R9431 Abnormal electrocardiogram [ECG] [EKG]: Secondary | ICD-10-CM | POA: Diagnosis not present

## 2013-12-12 DIAGNOSIS — M5137 Other intervertebral disc degeneration, lumbosacral region: Secondary | ICD-10-CM | POA: Diagnosis not present

## 2013-12-12 DIAGNOSIS — M79609 Pain in unspecified limb: Secondary | ICD-10-CM | POA: Diagnosis not present

## 2013-12-12 DIAGNOSIS — D5700 Hb-SS disease with crisis, unspecified: Secondary | ICD-10-CM | POA: Diagnosis present

## 2013-12-12 DIAGNOSIS — G894 Chronic pain syndrome: Secondary | ICD-10-CM | POA: Diagnosis not present

## 2013-12-12 DIAGNOSIS — D5701 Hb-SS disease with acute chest syndrome: Secondary | ICD-10-CM | POA: Diagnosis not present

## 2013-12-12 DIAGNOSIS — M545 Low back pain, unspecified: Secondary | ICD-10-CM | POA: Diagnosis not present

## 2013-12-12 DIAGNOSIS — M51379 Other intervertebral disc degeneration, lumbosacral region without mention of lumbar back pain or lower extremity pain: Secondary | ICD-10-CM | POA: Diagnosis not present

## 2013-12-12 DIAGNOSIS — R51 Headache: Secondary | ICD-10-CM | POA: Diagnosis not present

## 2013-12-12 DIAGNOSIS — R079 Chest pain, unspecified: Secondary | ICD-10-CM | POA: Diagnosis not present

## 2013-12-12 DIAGNOSIS — E559 Vitamin D deficiency, unspecified: Secondary | ICD-10-CM | POA: Diagnosis not present

## 2013-12-12 DIAGNOSIS — Z79899 Other long term (current) drug therapy: Secondary | ICD-10-CM | POA: Insufficient documentation

## 2013-12-12 LAB — CBC WITH DIFFERENTIAL/PLATELET
BASOS ABS: 0 10*3/uL (ref 0.0–0.1)
Basophils Relative: 0 % (ref 0–1)
Eosinophils Absolute: 0.1 10*3/uL (ref 0.0–0.7)
Eosinophils Relative: 1 % (ref 0–5)
HEMATOCRIT: 29.4 % — AB (ref 39.0–52.0)
Hemoglobin: 10.8 g/dL — ABNORMAL LOW (ref 13.0–17.0)
Lymphocytes Relative: 23 % (ref 12–46)
Lymphs Abs: 2.6 10*3/uL (ref 0.7–4.0)
MCH: 36.1 pg — ABNORMAL HIGH (ref 26.0–34.0)
MCHC: 36.7 g/dL — AB (ref 30.0–36.0)
MCV: 98.3 fL (ref 78.0–100.0)
MONO ABS: 0.5 10*3/uL (ref 0.1–1.0)
MONOS PCT: 5 % (ref 3–12)
NEUTROS ABS: 8.3 10*3/uL — AB (ref 1.7–7.7)
Neutrophils Relative %: 72 % (ref 43–77)
Platelets: 600 10*3/uL — ABNORMAL HIGH (ref 150–400)
RBC: 2.99 MIL/uL — ABNORMAL LOW (ref 4.22–5.81)
RDW: 16 % — AB (ref 11.5–15.5)
WBC: 11.6 10*3/uL — ABNORMAL HIGH (ref 4.0–10.5)

## 2013-12-12 LAB — COMPREHENSIVE METABOLIC PANEL
ALT: 12 U/L (ref 0–53)
AST: 37 U/L (ref 0–37)
Albumin: 4.1 g/dL (ref 3.5–5.2)
Alkaline Phosphatase: 49 U/L (ref 39–117)
BILIRUBIN TOTAL: 0.4 mg/dL (ref 0.3–1.2)
BUN: 4 mg/dL — ABNORMAL LOW (ref 6–23)
CO2: 25 meq/L (ref 19–32)
CREATININE: 0.67 mg/dL (ref 0.50–1.35)
Calcium: 9.1 mg/dL (ref 8.4–10.5)
Chloride: 102 mEq/L (ref 96–112)
GLUCOSE: 92 mg/dL (ref 70–99)
Potassium: 4.5 mEq/L (ref 3.7–5.3)
Sodium: 139 mEq/L (ref 137–147)
Total Protein: 7.9 g/dL (ref 6.0–8.3)

## 2013-12-12 LAB — I-STAT CHEM 8, ED
BUN: <3 mg/dL — ABNORMAL LOW (ref 6–23)
CALCIUM ION: 1.17 mmol/L (ref 1.12–1.23)
CHLORIDE: 102 meq/L (ref 96–112)
Creatinine, Ser: 0.8 mg/dL (ref 0.50–1.35)
Glucose, Bld: 96 mg/dL (ref 70–99)
HEMATOCRIT: 37 % — AB (ref 39.0–52.0)
Hemoglobin: 12.6 g/dL — ABNORMAL LOW (ref 13.0–17.0)
Potassium: 3.5 mEq/L — ABNORMAL LOW (ref 3.7–5.3)
Sodium: 142 mEq/L (ref 137–147)
TCO2: 22 mmol/L (ref 0–100)

## 2013-12-12 LAB — TROPONIN I: Troponin I: <0.3 ng/mL (ref <0.30)

## 2013-12-12 LAB — RETICULOCYTES
RBC.: 2.99 MIL/uL — ABNORMAL LOW (ref 4.22–5.81)
Retic Count, Absolute: 173.4 10*3/uL (ref 19.0–186.0)
Retic Ct Pct: 5.8 % — ABNORMAL HIGH (ref 0.4–3.1)

## 2013-12-12 LAB — D-DIMER, QUANTITATIVE: D-Dimer, Quant: <0.27 ug/mL-FEU (ref 0.00–0.48)

## 2013-12-12 MED ORDER — HYDROMORPHONE HCL PF 2 MG/ML IJ SOLN
2.0000 mg | Freq: Once | INTRAMUSCULAR | Status: AC
  Administered 2013-12-12: 2 mg via INTRAVENOUS
  Filled 2013-12-12: qty 1

## 2013-12-12 MED ORDER — HYDROMORPHONE HCL PF 2 MG/ML IJ SOLN: 3.0000 mg | Freq: Once | INTRAMUSCULAR | Status: DC

## 2013-12-12 MED ORDER — METHADONE HCL 5 MG PO TABS: 5.0000 mg | ORAL_TABLET | Freq: Every day | ORAL | Status: DC

## 2013-12-12 MED ORDER — METHADONE HCL 5 MG PO TABS
5.0000 mg | ORAL_TABLET | Freq: Once | ORAL | Status: AC
  Administered 2013-12-12: 5 mg via ORAL

## 2013-12-12 MED ORDER — FOLIC ACID 1 MG PO TABS
1.0000 mg | ORAL_TABLET | Freq: Every day | ORAL | Status: DC
  Administered 2013-12-12: 1 mg via ORAL
  Filled 2013-12-12: qty 1

## 2013-12-12 MED ORDER — NALOXONE HCL 0.4 MG/ML IJ SOLN: 0.4000 mg | INTRAMUSCULAR | Status: DC | PRN

## 2013-12-12 MED ORDER — ONDANSETRON HCL 4 MG/2ML IJ SOLN: 4.0000 mg | Freq: Four times a day (QID) | INTRAMUSCULAR | Status: DC | PRN

## 2013-12-12 MED ORDER — HYDROMORPHONE HCL PF 2 MG/ML IJ SOLN
4.0000 mg | Freq: Once | INTRAMUSCULAR | Status: DC
  Filled 2013-12-12: qty 2

## 2013-12-12 MED ORDER — HYDROMORPHONE 0.3 MG/ML IV SOLN
INTRAVENOUS | Status: DC
  Administered 2013-12-12: 6.97 mg via INTRAVENOUS
  Administered 2013-12-12 (×2): via INTRAVENOUS
  Administered 2013-12-12: 4.82 mg via INTRAVENOUS
  Filled 2013-12-12 (×2): qty 25

## 2013-12-12 MED ORDER — OXYCODONE HCL 5 MG PO TABS
ORAL_TABLET | ORAL | Status: AC
  Filled 2013-12-12: qty 3

## 2013-12-12 MED ORDER — METHADONE HCL 5 MG PO TABS
ORAL_TABLET | ORAL | Status: AC
  Filled 2013-12-12: qty 1

## 2013-12-12 MED ORDER — SODIUM CHLORIDE 0.9 % IV BOLUS (SEPSIS)
1000.0000 mL | Freq: Once | INTRAVENOUS | Status: AC
  Administered 2013-12-12: 1000 mL via INTRAVENOUS

## 2013-12-12 MED ORDER — SODIUM CHLORIDE 0.9 % IJ SOLN: 9.0000 mL | INTRAMUSCULAR | Status: DC | PRN

## 2013-12-12 MED ORDER — HYDROMORPHONE HCL PF 2 MG/ML IJ SOLN
2.0000 mg | Freq: Once | INTRAMUSCULAR | Status: AC
  Administered 2013-12-12: 2 mg via INTRAVENOUS
  Filled 2013-12-12 (×2): qty 1

## 2013-12-12 MED ORDER — KETOROLAC TROMETHAMINE 30 MG/ML IJ SOLN
30.0000 mg | Freq: Four times a day (QID) | INTRAMUSCULAR | Status: DC
  Administered 2013-12-12: 30 mg via INTRAVENOUS
  Filled 2013-12-12: qty 1

## 2013-12-12 MED ORDER — OXYCODONE HCL 5 MG PO TABS
15.0000 mg | ORAL_TABLET | Freq: Once | ORAL | Status: AC
  Administered 2013-12-12: 15 mg via ORAL

## 2013-12-12 MED ORDER — DEXTROSE-NACL 5-0.45 % IV SOLN
INTRAVENOUS | Status: DC
  Administered 2013-12-12: 14:00:00 via INTRAVENOUS

## 2013-12-12 MED ORDER — HYDROXYUREA 500 MG PO CAPS
1500.0000 mg | ORAL_CAPSULE | Freq: Every day | ORAL | Status: DC
  Administered 2013-12-12: 1500 mg via ORAL
  Filled 2013-12-12: qty 3

## 2013-12-12 MED ORDER — HYDROMORPHONE HCL PF 1 MG/ML IJ SOLN
1.0000 mg | Freq: Once | INTRAMUSCULAR | Status: AC
  Administered 2013-12-12: 1 mg via INTRAVENOUS

## 2013-12-12 MED ORDER — HYDROMORPHONE HCL PF 1 MG/ML IJ SOLN
1.0000 mg | Freq: Once | INTRAMUSCULAR | Status: DC
  Filled 2013-12-12: qty 1

## 2013-12-12 MED ORDER — HYDROMORPHONE HCL PF 2 MG/ML IJ SOLN
3.0000 mg | Freq: Once | INTRAMUSCULAR | Status: AC
  Administered 2013-12-12: 3 mg via INTRAVENOUS
  Filled 2013-12-12: qty 2

## 2013-12-12 MED ORDER — GABAPENTIN 100 MG PO CAPS
300.0000 mg | ORAL_CAPSULE | Freq: Two times a day (BID) | ORAL | Status: DC
  Administered 2013-12-12: 300 mg via ORAL
  Filled 2013-12-12: qty 3

## 2013-12-12 MED ORDER — DIPHENHYDRAMINE HCL 25 MG PO CAPS: 25.0000 mg | ORAL_CAPSULE | Freq: Four times a day (QID) | ORAL | Status: DC | PRN

## 2013-12-12 NOTE — ED Notes (Signed)
PT states that he began having pain yesterday am that subsided and then it returned around 4pm yesterday; pt complain of pain to his lower back and legs; pt states that this is typical sickle cell pain for him

## 2013-12-12 NOTE — Progress Notes (Signed)
No call from ED received concerning report for pending patient. This RN called ED to follow up on status of patient. Report received from [facility]; per RN awaiting to document current VSS.

## 2013-12-12 NOTE — ED Notes (Signed)
Bed: WA11 Expected date:  Expected time:  Means of arrival:  Comments: 

## 2013-12-12 NOTE — Discharge Summary (Signed)
[facility] Discharge Summary   Patient ID: Alejandro Soto MRN: [id number] DOB/AGE: 18-Apr-1986 28 y.o.  Admit date: 12/12/2013 Discharge date: 12/12/2013  Primary Care Physician:  MATTHEWS,MICHELLE A., MD  Admission Diagnoses:  Active Problems:   Sickle cell anemia with pain   Discharge Diagnoses:   Sickle cell anemia  Discharge Medications:    Medication List    ASK your doctor about these medications       buPROPion 150 MG 12 hr tablet  Commonly known as:  ZYBAN  Take 1 tablet (150 mg total) by mouth 2 (two) times daily. Take 1 tab daily x 3 days then increase to 1 tab BID. Doses should be separated by at least 8 hours. Last dose no later than 6:00pm. Stop smoking after 5 days of starting tx.     folic acid 1 MG tablet  Commonly known as:  FOLVITE  Take 1 mg by mouth daily.     gabapentin 300 MG capsule  Commonly known as:  NEURONTIN  Take 1 capsule (300 mg total) by mouth 2 (two) times daily.     hydroxyurea 500 MG capsule  Commonly known as:  HYDREA  Take 1,500 mg by mouth daily. May take with food to minimize GI side effects.     ibuprofen 600 MG tablet  Commonly known as:  ADVIL,MOTRIN  Take 1 tablet (600 mg total) by mouth as needed.     methadone 5 MG tablet  Commonly known as:  DOLOPHINE  Take 1 tablet (5 mg total) by mouth at bedtime.     multivitamin tablet  Take 2 tablets by mouth daily.     oxyCODONE 15 MG immediate release tablet  Commonly known as:  ROXICODONE  Take 1 tablet (15 mg total) by mouth every 4 (four) hours as needed for pain.         Consults:  None  Significant Diagnostic Studies:  Dg Chest 2 View  12/12/2013   CLINICAL DATA:  Mid chest pain.  Sickle cell crisis.  EXAM: CHEST  2 VIEW  COMPARISON:  08/12/2013  FINDINGS: Heart, mediastinum and hila are unremarkable. Lungs are clear and are symmetrically aerated. No pleural effusion. No pneumothorax.  Bony thorax is intact.  IMPRESSION: No active cardiopulmonary  disease.   Electronically Signed   By: Lajean Manes M.D.   On: 12/12/2013 07:22   Dg Lumbar Spine Complete  11/27/2013   CLINICAL DATA:  Low back pain  EXAM: LUMBAR SPINE - COMPLETE 4+ VIEW  COMPARISON:  Lumbar MRI 12/05/2012  FINDINGS: There is no evidence of lumbar spine fracture. Alignment is normal. Intervertebral disc spaces are maintained. Schmorl's nodes involving the superior and inferior endplates of L1, unchanged from the prior MRI.  IMPRESSION: Negative.   Electronically Signed   By: Franchot Gallo M.D.   On: 11/27/2013 16:39     [facility] Course:   Patient was admitted to day hospital for extended observation. Started hypotonic IVFs for cellular rehydration. Was given IV Toradol times 1 dose and IV dilaudid per weight based rapid re-dosing times 3 doses. Pain intensity remained increased, started dilaudid PCA per weight based protocol. Patient used a total of 11.79 mg with 21 mg and 17 deliveries Pain intensity decreased to 5/10. Patient was given Oxycodone 15 mg po prior to discharge, pain intensity decreased to  4/10.Patient reports that he can manage at home at current pain intensity. He states that he has transportation to pick up his medications from  the pharmacy on tomorrow. Gave Methadone 5 mg X 1 prior to discharge due to transportation constraints. Prescribed Methadone 5 mg at bedtime, #30. Reviewed [place] [facility] Reporting system prior to reorder.  Recommend that patient drink 64 ounces of water per hour and continue maintenance medications. Also, patient to follow up with Dr. Zigmund Daniel as scheduled.     Physical Exam at Discharge:  BP 109/54  Pulse 60  Temp(Src) 97.8 F (36.6 C) (Oral)  Resp 20  Ht 5\' 8"  (1.727 m)  Wt 145 lb (65.772 kg)  BMI 22.05 kg/m2  SpO2 100%  General appearance: alert, cooperative, appears stated age  Head: Normocephalic, without obvious abnormality, atraumatic  Neck: no adenopathy, no carotid bruit, no JVD, supple, symmetrical,  trachea midline and thyroid not enlarged, symmetric, no tenderness/mass/nodules  Back: symmetric, no curvature. ROM normal. No CVA tenderness.  Lungs: clear to auscultation bilaterally  Chest wall: no tenderness  Heart: regular rate and rhythm, S1, S2 normal, no murmur, click, rub or gallop  Abdomen: soft, non-tender; bowel sounds normal; no masses, no organomegaly  Extremities: extremities normal, atraumatic, no cyanosis or edema  Pulses: 2+ and symmetric  Skin: Skin color, texture, turgor normal. No rashes or lesions  Lymph nodes: Cervical, supraclavicular, and axillary nodes normal.  Neurologic: Alert and oriented X 3, normal strength and tone. Normal symmetric reflexes. Normal coordination and gait   Disposition at Discharge: 01-Home or Self Care  Discharge Orders:   Condition at Discharge:   Stable  Time spent on Discharge:  Greater than 30 minutes.  Signed: Hollis,Lachina M 12/12/2013, 6:06 PM

## 2013-12-12 NOTE — Discharge Instructions (Signed)
Report directly to the sickle cell clinic, as they are expecting your arrival. Call a cardiologist for further evaluation of your abnormal EKG. Call for a follow up appointment with a Family or Primary Care Provider.  Return to the Emergency Department if symptoms worsen.   Take medication as prescribed.   Sickle Cell Anemia, Adult Sickle cell anemia is a condition in which red blood cells have an abnormal "sickle" shape. This abnormal shape shortens the cells' life span, which results in a lower than normal concentration of red blood cells in the blood. The sickle shape also causes the cells to clump together and block free blood flow through the blood vessels. As a result, the tissues and organs of the body do not receive enough oxygen. Sickle cell anemia causes organ damage and pain and increases the risk of infection. CAUSES  Sickle cell anemia is a genetic disorder. Those who receive two copies of the gene have the condition, and those who receive one copy have the trait. RISK FACTORS The sickle cell gene is most common in people whose families originated in [place]. Other areas of the globe where sickle cell trait occurs include the [place], [place] and [place], and the [place].  SIGNS AND SYMPTOMS  Pain, especially in the extremities, back, chest, or abdomen (common). The pain may start suddenly or may develop following an illness, especially if there is dehydration. Pain can also occur due to overexertion or exposure to extreme temperature changes.  Frequent severe bacterial infections, especially certain types of pneumonia and meningitis.  Pain and swelling in the hands and feet.  Decreased activity.   Loss of appetite.   Change in behavior.  Headaches.  Seizures.  Shortness of breath or difficulty breathing.  Vision changes.  Skin ulcers. Those with the trait may not have symptoms or they may have mild symptoms.  DIAGNOSIS  Sickle cell  anemia is diagnosed with blood tests that demonstrate the genetic trait. It is often diagnosed during the newborn period, due to mandatory testing nationwide. A variety of blood tests, X-rays, CT scans, MRI scans, ultrasounds, and lung function tests may also be done to monitor the condition. TREATMENT  Sickle cell anemia may be treated with:  Medicines. You may be given pain medicines, antibiotic medicines (to treat and prevent infections) or medicines to increase the production of certain types of hemoglobin.  Fluids.  Oxygen.  Blood transfusions. HOME CARE INSTRUCTIONS   Drink enough fluid to keep your urine clear or pale yellow. Increase your fluid intake in hot weather and during exercise.  Do not smoke. Smoking lowers oxygen levels in the blood.   Only take over-the-counter or prescription medicines for pain, fever, or discomfort as directed by your health care provider.  Take antibiotics as directed by your health care provider. Make sure you finish them it even if you start to feel better.   Take supplements as directed by your health care provider.   Consider wearing a medical alert bracelet. This tells anyone caring for you in an emergency of your condition.   When traveling, keep your medical information, health care provider's names, and the medicines you take with you at all times.   If you develop a fever, do not take medicines to reduce the fever right away. This could cover up a problem that is developing. Notify your health care provider.  Keep all follow-up appointments with your health care provider. Sickle cell anemia requires regular medical care. [place]  IF: You have a fever. SEEK IMMEDIATE MEDICAL CARE IF:   You feel dizzy or faint.   You have new abdominal pain, especially on the left side near the stomach area.   You develop a persistent, often uncomfortable and painful penile erection (priapism). If this is not treated immediately it  will lead to impotence.   You have numbness your arms or legs or you have a hard time moving them.   You have a hard time with speech.   You have a fever or persistent symptoms for more than 2-3 days.   You have a fever and your symptoms suddenly get worse.   You have signs or symptoms of infection. These include:   Chills.   Abnormal tiredness (lethargy).   Irritability.   Poor eating.   Vomiting.   You develop pain that is not helped with medicine.   You develop shortness of breath.  You have pain in your chest.   You are coughing up pus-like or bloody sputum.   You develop a stiff neck.  Your feet or hands swell or have pain.  Your abdomen appears bloated.  You develop joint pain. MAKE SURE YOU:  Understand these instructions.  Will watch your child's condition.  Will get help right away if your child is not doing well or gets worse. Document Released: 09/16/2005 Document Revised: 03/29/2013 Document Reviewed: 01/18/2013 [facility] Patient Information 2015 [place], [place]. This information is not intended to replace advice given to you by your health care provider. Make sure you discuss any questions you have with your health care provider.

## 2013-12-12 NOTE — ED Provider Notes (Signed)
Medical screening examination/treatment/procedure(s) were performed by non-physician practitioner and as supervising physician I was immediately available for consultation/collaboration.   EKG Interpretation   Date/Time:  Tuesday December 12 2013 07:40:02 EDT Ventricular Rate:  67 PR Interval:  161 QRS Duration: 97 QT Interval:  381 QTC Calculation: 402 R Axis:   23 Text Interpretation:  Sinus rhythm RSR' in V1 or V2, probably normal  variant Borderline T abnormalities, inferior leads No old tracing to  compare Confirmed by [facility]  MD, ELLIOTT [phone]) on 12/12/2013 7:45:27 AM       April Alfonso Patten, MD 12/12/13 2334

## 2013-12-12 NOTE — Progress Notes (Signed)
Patient discharged home. Patient in no apparent distress. Discharge instructions reviewed. Prescription given. Patient acknowledges. Patient left day hospital with belongings ambulatory via self.

## 2013-12-12 NOTE — ED Notes (Signed)
Called sickle cell clinic to give report. Awaiting a call back from RN for report.

## 2013-12-12 NOTE — ED Notes (Addendum)
RN at bedside for IV.

## 2013-12-12 NOTE — H&P (Signed)
Alejandro History and Physical  Alejandro Soto IHK:[id number] DOB: 1985-10-04 DOA: 12/12/2013   PCP: Alejandro A., MD   Chief Complaint: Pain in back, legs and chest wall since yesterday.   HPI: Pt states that the pain started 2 days ago and was initially controlled with his oxycodone but patient reports that he has been without his Methadone as he had no transportation to pick up his prescription. However the pain escalated last night to a point where it was uncontrollable all night. The patient reports that he was trying to wait to get to the Day Hospital this morning, however the pain in the chest wall was so intense that he asked a family member to bring him to the ED for evaluation. In the ED he had a EKG which showed inverted T-Waves in leads III and aVF. He was evaluated for the chest wall pain and EKG changes with 2 troponin levels which were both normal. Additionally he was treated for pain in the ED with dilaudid IVP (last dose of medication 21/2 hours ago), and his pain is currently 9/10. His pain is localized to back, legs and chest wall. He describes the pain as throbbing and stabbing in nature. It is non-radiating and not associated with any other symptoms.     Review of Systems:  Constitutional: No weight loss, night sweats, Fevers, chills, fatigue.  HEENT: No headaches, dizziness, seizures, vision changes, difficulty swallowing,Tooth/dental problems,Sore throat, No sneezing, itching, ear ache, nasal congestion, post nasal drip,  Cardio-vascular: No chest pain, Orthopnea, PND, swelling in lower extremities, anasarca, dizziness, palpitations  GI: No heartburn, indigestion, abdominal pain, nausea, vomiting, diarrhea, change in bowel habits, loss of appetite  Resp: No shortness of breath with exertion or at rest. No excess mucus, no productive cough, No non-productive cough, No coughing up of blood.No change in color of mucus.No wheezing.No chest wall deformity   Skin: no rash or lesions.  GU: no dysuria, change in color of urine, no urgency or frequency. No flank pain.   Psych: No change in mood or affect. No depression or anxiety. No memory loss.    Past Medical History  Diagnosis Date  . Sickle cell anemia   . Pneumonia    Past Surgical History  Procedure Laterality Date  . No past surgeries     Social History:  reports that he has been smoking Cigarettes.  He has been smoking about 0.00 packs per day for the past 5 years. He has never used smokeless tobacco. He reports that he does not drink alcohol or use illicit drugs.  Allergies  Allergen Reactions  . Amoxicillin Diarrhea  . Morphine And Related Other (See Comments)    Made his stomach hurt    Family History  Problem Relation Age of Onset  . Diabetes Father   . Cancer Father 73    Prostate  . Asthma Brother   . Cancer Maternal Grandmother     colon     Prior to Admission medications   Medication Sig Start Date End Date Taking? Authorizing Provider  buPROPion (ZYBAN) 150 MG 12 hr tablet Take 1 tablet (150 mg total) by mouth 2 (two) times daily. Take 1 tab daily x 3 days then increase to 1 tab BID. Doses should be separated by at least 8 hours. Last dose no later than 6:00pm. Stop smoking after 5 days of starting tx. 12/04/13  Yes Leana Gamer, MD  folic acid (FOLVITE) 1 MG tablet Take 1 mg by mouth  daily.   Yes Historical Provider, MD  gabapentin (NEURONTIN) 300 MG capsule Take 1 capsule (300 mg total) by mouth 2 (two) times daily. 09/07/13  Yes Leana Gamer, MD  hydroxyurea (HYDREA) 500 MG capsule Take 1,500 mg by mouth daily. May take with food to minimize GI side effects.   Yes Historical Provider, MD  ibuprofen (ADVIL,MOTRIN) 600 MG tablet Take 1 tablet (600 mg total) by mouth as needed. 10/19/13  Yes Leana Gamer, MD  methadone (DOLOPHINE) 5 MG tablet Take 1 tablet (5 mg total) by mouth at bedtime. 11/07/13  Yes Dorena Dew, FNP  Multiple Vitamin  (MULTIVITAMIN) tablet Take 2 tablets by mouth daily.    Yes Historical Provider, MD  oxyCODONE (ROXICODONE) 15 MG immediate release tablet Take 1 tablet (15 mg total) by mouth every 4 (four) hours as needed for pain. 11/29/13  Yes Leana Gamer, MD   Physical Exam: Filed Vitals:   12/12/13 0331 12/12/13 0745 12/12/13 1240  BP: 127/76 116/75 110/65  Pulse: 73 66 51  Temp: 98.1 F (36.7 C)    TempSrc: Oral    Resp: 18 16 16   Height: 5\' 8"  (1.727 m)    Weight: 145 lb (65.772 kg)    SpO2: 99% 100% 100%   BP 108/64  Pulse 50  Temp(Src) 98.5 F (36.9 C) (Oral)  Resp 18  Ht 5\' 8"  (1.727 m)  Wt 145 lb (65.772 kg)  BMI 22.05 kg/m2  SpO2 100%  General Appearance:    Alert, cooperative, no distress, appears stated age  Head:    Normocephalic, without obvious abnormality, atraumatic  Eyes:    PERRL, conjunctiva/corneas clear, EOM's intact, fundi    benign, both eyes anicteric.     Neck:   Supple, symmetrical, trachea midline, no adenopathy;       thyroid:  No enlargement/tenderness/nodules; no carotid   bruit or JVD  Back:     Symmetric, no curvature, ROM normal, no CVA tenderness  Lungs:     Clear to auscultation bilaterally, respirations unlabored  Chest wall:    No tenderness or deformity  Heart:    Regular rate and rhythm, S1 and S2 normal, no murmur, rub   or gallop  Abdomen:     Soft, non-tender, bowel sounds active all four quadrants,    no masses, no organomegaly  Extremities:   Extremities normal, atraumatic, no cyanosis or edema  Pulses:   2+ and symmetric all extremities  Skin:   Skin color, texture, turgor normal, no rashes or lesions  Lymph nodes:   Cervical, supraclavicular, and axillary nodes normal  Neurologic:   CNII-XII intact. Normal strength, sensation and reflexes      throughout    Labs on Admission:   Basic Metabolic Panel:  Recent Labs Lab 12/12/13 0613 12/12/13 0740  NA 142 139  K 3.5* 4.5  CL 102 102  CO2  --  25  GLUCOSE 96 92  BUN <3*  4*  CREATININE 0.80 0.67  CALCIUM  --  9.1   Liver Function Tests:  Recent Labs Lab 12/12/13 0740  AST 37  ALT 12  ALKPHOS 49  BILITOT 0.4  PROT 7.9  ALBUMIN 4.1   CBC:  Recent Labs Lab 12/12/13 0544 12/12/13 0613  WBC 11.6*  --   NEUTROABS 8.3*  --   HGB 10.8* 12.6*  HCT 29.4* 37.0*  MCV 98.3  --   PLT 600*  --    BNP: No components found with this basename:  POCBNP,    Radiological Exams on Admission: Dg Chest 2 View  12/12/2013   CLINICAL DATA:  Mid chest pain.  Sickle cell crisis.  EXAM: CHEST  2 VIEW  COMPARISON:  08/12/2013  FINDINGS: Heart, mediastinum and hila are unremarkable. Lungs are clear and are symmetrically aerated. No pleural effusion. No pneumothorax.  Bony thorax is intact.  IMPRESSION: No active cardiopulmonary disease.   Electronically Signed   By: Lajean Manes M.D.   On: 12/12/2013 07:22    EKG: Independently reviewed. Mild T-Wave inversion in lead III and aVF.   Assessment/Plan: Active Problems: Hb [place] with mild crisis: Pt presenting with acute crisis. Pt will be treated treated with weight based Dilaudid PCA, IVF and Toradol . Patient will be re-evaluated for pain in the context of function and relationship to baseline as care progresses.    Time spend: 40 minutes Code Status: full Code Family Communication: N/A Disposition Plan: Not yet determined  Alejandro A., MD  Pager [phone]  If 7PM-7AM, please contact night-coverage [email or web address] Password [facility] 12/12/2013, 1:36 PM

## 2013-12-12 NOTE — ED Notes (Signed)
Patient transported to X-ray 

## 2013-12-12 NOTE — ED Provider Notes (Signed)
CSN: [id number]     Arrival date & time 12/12/13  7902 History   First MD Initiated Contact with Patient 12/12/13 [phone]     Chief Complaint  Patient presents with  . Sickle Cell Pain Crisis     (Consider location/radiation/quality/duration/timing/severity/associated sxs/prior Treatment) HPI Comments: The patient is a 28 year old male past history of sickle cell anemia presenting the emergency room with bilateral lower extremity pain and low back pain since yesterday. The patient reports no recent injury to the areas. He reports constant nonradiating pain. No aggravating or relieving factors. He reports taking four oxycodone 15 mg yesterday without resolution of symptoms. He reports mild chest discomfort yesterday, self resolved. Complains of chest discomfort with deep inspiration. Denies shortness of breath, fever, nausea, vomiting. Does not know baseline hemoglobin.  The patient reports he is out of his methadone, last dose 1 week ago. PCP: MATTHEWS,MICHELLE A., MD   The history is provided by the patient. No language interpreter was used.    Past Medical History  Diagnosis Date  . Sickle cell anemia   . Pneumonia    Past Surgical History  Procedure Laterality Date  . No past surgeries     Family History  Problem Relation Age of Onset  . Diabetes Father   . Cancer Father 62    Prostate  . Asthma Brother   . Cancer Maternal Grandmother     colon    History  Substance Use Topics  . Smoking status: Current Some Day Smoker -- 5 years    Types: Cigarettes  . Smokeless tobacco: Never Used     Comment: 1-3 daily  . Alcohol Use: No    Review of Systems  Constitutional: Negative for fever and chills.  Respiratory: Negative for chest tightness and shortness of breath.   Cardiovascular: Positive for chest pain. Negative for leg swelling.  Gastrointestinal: Negative for nausea, vomiting and abdominal pain.  Musculoskeletal: Positive for arthralgias and back pain.  Skin: Negative  for wound.  All other systems reviewed and are negative.     Allergies  Amoxicillin and Morphine and related  Home Medications   Prior to Admission medications   Medication Sig Start Date End Date Taking? Authorizing Provider  buPROPion (ZYBAN) 150 MG 12 hr tablet Take 1 tablet (150 mg total) by mouth 2 (two) times daily. Take 1 tab daily x 3 days then increase to 1 tab BID. Doses should be separated by at least 8 hours. Last dose no later than 6:00pm. Stop smoking after 5 days of starting tx. 12/04/13  Yes Leana Gamer, MD  folic acid (FOLVITE) 1 MG tablet Take 1 mg by mouth daily.   Yes Historical Provider, MD  gabapentin (NEURONTIN) 300 MG capsule Take 1 capsule (300 mg total) by mouth 2 (two) times daily. 09/07/13  Yes Leana Gamer, MD  hydroxyurea (HYDREA) 500 MG capsule Take 1,500 mg by mouth daily. May take with food to minimize GI side effects.   Yes Historical Provider, MD  ibuprofen (ADVIL,MOTRIN) 600 MG tablet Take 1 tablet (600 mg total) by mouth as needed. 10/19/13  Yes Leana Gamer, MD  methadone (DOLOPHINE) 5 MG tablet Take 1 tablet (5 mg total) by mouth at bedtime. 11/07/13  Yes Dorena Dew, FNP  Multiple Vitamin (MULTIVITAMIN) tablet Take 2 tablets by mouth daily.    Yes Historical Provider, MD  oxyCODONE (ROXICODONE) 15 MG immediate release tablet Take 1 tablet (15 mg total) by mouth every 4 (four) hours as needed  for pain. 11/29/13  Yes Leana Gamer, MD   BP 127/76  Pulse 73  Temp(Src) 98.1 F (36.7 C) (Oral)  Resp 18  Ht 5\' 8"  (1.727 m)  Wt 145 lb (65.772 kg)  BMI 22.05 kg/m2  SpO2 99% Physical Exam  Nursing note and vitals reviewed. Constitutional: He is oriented to person, place, and time. He appears well-developed and well-nourished.  Non-toxic appearance. He does not have a sickly appearance. He does not appear ill. No distress.  HENT:  Head: Normocephalic and atraumatic.  Eyes: Conjunctivae and EOM are normal. Pupils are equal,  round, and reactive to light. Right eye exhibits no discharge. Left eye exhibits no discharge. No scleral icterus.  Neck: Normal range of motion. Neck supple.  Cardiovascular: Normal rate and regular rhythm.   Murmur heard. No lower extremity edema  Pulmonary/Chest: Effort normal and breath sounds normal. No respiratory distress. He has no wheezes. He has no rales. He exhibits no tenderness.  Abdominal: Soft. Bowel sounds are normal. He exhibits no distension. There is no tenderness. There is no rebound and no guarding.  Musculoskeletal: Normal range of motion. He exhibits no edema.  Lower extremity discomfort reproducible with palpation. Moves all 4 extremities without ataxia.  Neurological: He is alert and oriented to person, place, and time.  Skin: Skin is warm and dry. No rash noted. He is not diaphoretic.  Psychiatric: He has a normal mood and affect. His behavior is normal. Thought content normal.    ED Course  Procedures (including critical care time) Labs Review Results for orders placed during the hospital encounter of 12/12/13  CBC WITH DIFFERENTIAL      Result Value Ref Range   WBC 11.6 (*) 4.0 - 10.5 K/uL   RBC 2.99 (*) 4.22 - 5.81 MIL/uL   Hemoglobin 10.8 (*) 13.0 - 17.0 g/dL   HCT 29.4 (*) 39.0 - 52.0 %   MCV 98.3  78.0 - 100.0 fL   MCH 36.1 (*) 26.0 - 34.0 pg   MCHC 36.7 (*) 30.0 - 36.0 g/dL   RDW 16.0 (*) 11.5 - 15.5 %   Platelets 600 (*) 150 - 400 K/uL   Neutrophils Relative % 72  43 - 77 %   Neutro Abs 8.3 (*) 1.7 - 7.7 K/uL   Lymphocytes Relative 23  12 - 46 %   Lymphs Abs 2.6  0.7 - 4.0 K/uL   Monocytes Relative 5  3 - 12 %   Monocytes Absolute 0.5  0.1 - 1.0 K/uL   Eosinophils Relative 1  0 - 5 %   Eosinophils Absolute 0.1  0.0 - 0.7 K/uL   Basophils Relative 0  0 - 1 %   Basophils Absolute 0.0  0.0 - 0.1 K/uL  RETICULOCYTES      Result Value Ref Range   Retic Ct Pct 5.8 (*) 0.4 - 3.1 %   RBC. 2.99 (*) 4.22 - 5.81 MIL/uL   Retic Count, Manual 173.4   19.0 - 186.0 K/uL  D-DIMER, QUANTITATIVE      Result Value Ref Range   D-Dimer, Quant <0.27  0.00 - 0.48 ug/mL-FEU  COMPREHENSIVE METABOLIC PANEL      Result Value Ref Range   Sodium 139  137 - 147 mEq/L   Potassium 4.5  3.7 - 5.3 mEq/L   Chloride 102  96 - 112 mEq/L   CO2 25  19 - 32 mEq/L   Glucose, Bld 92  70 - 99 mg/dL   BUN 4 (*)  6 - 23 mg/dL   Creatinine, Ser 0.67  0.50 - 1.35 mg/dL   Calcium 9.1  8.4 - 10.5 mg/dL   Total Protein 7.9  6.0 - 8.3 g/dL   Albumin 4.1  3.5 - 5.2 g/dL   AST 37  0 - 37 U/L   ALT 12  0 - 53 U/L   Alkaline Phosphatase 49  39 - 117 U/L   Total Bilirubin 0.4  0.3 - 1.2 mg/dL   GFR calc non Af Amer >90  >90 mL/min   GFR calc Af Amer >90  >90 mL/min  TROPONIN I      Result Value Ref Range   Troponin I <0.30  <0.30 ng/mL  TROPONIN I      Result Value Ref Range   Troponin I <0.30  <0.30 ng/mL  I-STAT CHEM 8, ED      Result Value Ref Range   Sodium 142  137 - 147 mEq/L   Potassium 3.5 (*) 3.7 - 5.3 mEq/L   Chloride 102  96 - 112 mEq/L   BUN <3 (*) 6 - 23 mg/dL   Creatinine, Ser 0.80  0.50 - 1.35 mg/dL   Glucose, Bld 96  70 - 99 mg/dL   Calcium, Ion 1.17  1.12 - 1.23 mmol/L   TCO2 22  0 - 100 mmol/L   Hemoglobin 12.6 (*) 13.0 - 17.0 g/dL   HCT 37.0 (*) 39.0 - 52.0 %   Dg Chest 2 View  12/12/2013   CLINICAL DATA:  Mid chest pain.  Sickle cell crisis.  EXAM: CHEST  2 VIEW  COMPARISON:  08/12/2013  FINDINGS: Heart, mediastinum and hila are unremarkable. Lungs are clear and are symmetrically aerated. No pleural effusion. No pneumothorax.  Bony thorax is intact.  IMPRESSION: No active cardiopulmonary disease.   Electronically Signed   By: Lajean Manes M.D.   On: 12/12/2013 07:22     EKG Interpretation   Date/Time:  Tuesday December 12 2013 07:40:02 EDT Ventricular Rate:  67 PR Interval:  161 QRS Duration: 97 QT Interval:  381 QTC Calculation: 402 R Axis:   23 Text Interpretation:  Sinus rhythm RSR' in V1 or V2, probably normal  variant Borderline  T abnormalities, inferior leads No old tracing to  compare Confirmed by [facility]  MD, ELLIOTT [phone]) on 12/12/2013 7:45:27 AM      MDM   Final diagnoses:  Sickle cell anemia with pain  Abnormal EKG   Patient presents with sickle cell pain. Reports taking medication at home he does report a history of chest pain which resolved yesterday now has pleuritic chest pain. The patient is afebrile, ice and saturation 99% on room air, normal heart rate, labs, x-ray, EKG, d-dimer ordered to rule out PE. Pain medication and IV fluids ordered. Hemoglobin 10.8, stable since 3 weeks ago. slight leukocytosis at 11.6. CMP shows normal bilirubin. Dr. Zigmund Daniel also evaluated the patient while in the emergency room and it stays pain control and can followup with her in clinic, or see her in the sickle cell clinic today. Dr. Eulis Foster also evaluated the patient in emergency room in and ordered a troponin to 2 abnormal EKG 0937 Revaluation patient resting comfortably in room stating minimal relief with medication stating pain is a 8/10. 1110 Reevaluation patient resting comfortably in room watching TV and talking to his father reports discomfort is 7/10. Negative troponin x2. Discussed patient history, condition with Dr. Zigmund Daniel who advises discharge the patient to the sickle cell patient for further  evaluation. Discussed lab results, imaging results, and treatment plan with the patient.  Advised the patient and his father to followup with an cardiologist for further evaluation of his abnormal EKG, and to report directly to the sickle cell clinic. Return precautions given. Reports understanding and no other concerns at this time.  Patient is stable for discharge at this time.  Meds given in ED:  Medications  HYDROmorphone (DILAUDID) injection 2 mg (2 mg Intravenous Given 12/12/13 0734)  sodium chloride 0.9 % bolus 1,000 mL (1,000 mLs Intravenous New Bag/Given 12/12/13 0833)  HYDROmorphone (DILAUDID) injection 1 mg (1 mg  Intravenous Given 12/12/13 0831)  HYDROmorphone (DILAUDID) injection 3 mg (3 mg Intravenous Given 12/12/13 0942)    New Prescriptions   No medications on file        Lorrine Kin, PA-C 12/12/13 1258

## 2013-12-12 NOTE — ED Provider Notes (Signed)
  Face-to-face evaluation   History: He complains of generalized pain that is consistent with his usual crisis, which started yesterday. He also has chest discomfort in the center of his chest that has been present since yesterday. He denies weakness, dizziness, nausea, or vomiting. He is using his usual medications, without relief  Physical exam: Alert, calm, cooperative. Heart regular rate and rhythm, without murmur. Lungs clear to auscultation.   EKG Interpretation  Date/Time:  Tuesday December 12 2013 07:40:02 EDT Ventricular Rate:  67 PR Interval:  161 QRS Duration: 97 QT Interval:  381 QTC Calculation: 402 R Axis:   23 Text Interpretation:  Sinus rhythm RSR' in V1 or V2, probably normal variant Borderline T abnormalities, inferior leads No old tracing to compare Confirmed by [facility]  MD, ELLIOTT [phone]) on 12/12/2013 7:45:27 AM        Medical screening examination/treatment/procedure(s) were conducted as a shared visit with non-physician practitioner(s) and myself.  I personally evaluated the patient during the encounter  Richarda Blade, MD 12/12/13 [phone]

## 2013-12-14 ENCOUNTER — Telehealth: Payer: Self-pay | Admitting: Internal Medicine

## 2013-12-14 DIAGNOSIS — G894 Chronic pain syndrome: Secondary | ICD-10-CM

## 2013-12-14 DIAGNOSIS — D571 Sickle-cell disease without crisis: Secondary | ICD-10-CM

## 2013-12-14 DIAGNOSIS — M47817 Spondylosis without myelopathy or radiculopathy, lumbosacral region: Secondary | ICD-10-CM

## 2013-12-14 MED ORDER — IBUPROFEN 600 MG PO TABS: 600.0000 mg | ORAL_TABLET | ORAL | Status: DC | PRN

## 2013-12-14 MED ORDER — OXYCODONE HCL 15 MG PO TABS: 15.0000 mg | ORAL_TABLET | ORAL | Status: DC | PRN

## 2013-12-14 NOTE — Telephone Encounter (Signed)
Refill request for  oxyCODONE (ROXICODONE) 15 MG immediate release tablet / 600mg  Ibuprofen reorder electronically / LOV 11/27/2013

## 2013-12-14 NOTE — Telephone Encounter (Signed)
Prescription refilled for Oxycodone 15 mg #90 not to be filled before 12/19/2013.

## 2013-12-15 ENCOUNTER — Telehealth: Payer: Self-pay | Admitting: Internal Medicine

## 2013-12-15 ENCOUNTER — Telehealth: Payer: Self-pay

## 2013-12-15 DIAGNOSIS — D571 Sickle-cell disease without crisis: Secondary | ICD-10-CM

## 2013-12-15 DIAGNOSIS — M47817 Spondylosis without myelopathy or radiculopathy, lumbosacral region: Secondary | ICD-10-CM

## 2013-12-15 MED ORDER — IBUPROFEN 600 MG PO TABS: 600.0000 mg | ORAL_TABLET | Freq: Four times a day (QID) | ORAL | Status: DC | PRN

## 2013-12-15 NOTE — Telephone Encounter (Signed)
Call Documentation      Glori Bickers at 12/15/2013  9:11 AM      Status: Signed            Pharmacy called regarding RX .Pharm needs to know how often( Frequency) because insurance will not fill with directions(as needed) [phone]

## 2013-12-15 NOTE — Telephone Encounter (Signed)
Pharmacy called regarding RX needs to be taken  , insurance needs to know how often because insurance will not fill with directions(as needed) please call [phone].

## 2013-12-20 ENCOUNTER — Ambulatory Visit: Payer: Medicare Other | Admitting: Family Medicine

## 2013-12-27 ENCOUNTER — Telehealth: Payer: Self-pay | Admitting: Internal Medicine

## 2013-12-27 ENCOUNTER — Ambulatory Visit: Payer: Medicare Other | Admitting: Family Medicine

## 2013-12-27 DIAGNOSIS — G894 Chronic pain syndrome: Secondary | ICD-10-CM

## 2013-12-27 DIAGNOSIS — D571 Sickle-cell disease without crisis: Secondary | ICD-10-CM

## 2013-12-27 NOTE — Telephone Encounter (Signed)
Refill request for oxyCODONE (ROXICODONE) 15 MG immediate release tablet / LOV 11/27/2013

## 2013-12-28 NOTE — Discharge Summary (Signed)
Pt seen and examined and discussed with NP Cammie Sickle. Agree with discharge home.

## 2013-12-29 ENCOUNTER — Telehealth (HOSPITAL_COMMUNITY): Payer: Self-pay | Admitting: *Deleted

## 2013-12-29 MED ORDER — OXYCODONE HCL 15 MG PO TABS: 15.0000 mg | ORAL_TABLET | ORAL | Status: DC | PRN

## 2013-12-29 NOTE — Telephone Encounter (Signed)
Prescription reordered for oxycodone 15 mg #90 tabs. Prescription not to be filled before 01/01/2014.

## 2013-12-29 NOTE — Telephone Encounter (Signed)
Received patient returned call. Notified patient that oxycodone refill request is early and not to be filled until 01/01/14. Patient denies any complaints or concerns with his medication. Patient states he put in request early so that it would be ready for pick up by the 13th as it is a Monday and Medication pick up is on Tuesday. Informed patient that per MD medication requests should be made no earlier than 3 days before due. Patient acknowledges. Noted that he would be out of medication at that time. To notify MD. Informed patient that prescription is ready for pick up today. Patient acknowledges.

## 2014-01-11 DIAGNOSIS — M549 Dorsalgia, unspecified: Secondary | ICD-10-CM | POA: Diagnosis not present

## 2014-01-11 DIAGNOSIS — M545 Low back pain, unspecified: Secondary | ICD-10-CM | POA: Diagnosis not present

## 2014-01-11 DIAGNOSIS — K859 Acute pancreatitis without necrosis or infection, unspecified: Secondary | ICD-10-CM | POA: Diagnosis not present

## 2014-01-11 DIAGNOSIS — R109 Unspecified abdominal pain: Secondary | ICD-10-CM | POA: Diagnosis not present

## 2014-01-11 DIAGNOSIS — D571 Sickle-cell disease without crisis: Secondary | ICD-10-CM | POA: Diagnosis not present

## 2014-01-15 ENCOUNTER — Telehealth: Payer: Self-pay | Admitting: Internal Medicine

## 2014-01-15 DIAGNOSIS — D5700 Hb-SS disease with crisis, unspecified: Secondary | ICD-10-CM

## 2014-01-15 DIAGNOSIS — G894 Chronic pain syndrome: Secondary | ICD-10-CM

## 2014-01-15 DIAGNOSIS — D571 Sickle-cell disease without crisis: Secondary | ICD-10-CM

## 2014-01-15 MED ORDER — METHADONE HCL 5 MG PO TABS: 5.0000 mg | ORAL_TABLET | Freq: Every day | ORAL | Status: DC

## 2014-01-15 MED ORDER — OXYCODONE HCL 15 MG PO TABS: 15.0000 mg | ORAL_TABLET | ORAL | Status: DC | PRN

## 2014-01-15 NOTE — Telephone Encounter (Signed)
Prescription reordered for oxycodone 15 mg #90 tabs and Methadone 5 mg # 30 tabs.

## 2014-01-15 NOTE — Telephone Encounter (Signed)
Medication refill for oxyCODONE (ROXICODONE) 15 MG immediate release tablet, methadone (DOLOPHINE) 5 MG tablet / LOV 11/27/2013

## 2014-01-23 ENCOUNTER — Non-Acute Institutional Stay (HOSPITAL_COMMUNITY)
Admission: AD | Admit: 2014-01-23 | Discharge: 2014-01-23 | Disposition: A | Discharge status: Home / Self Care | Payer: Medicare Other | Source: Ambulatory Visit | Attending: Internal Medicine | Admitting: Internal Medicine

## 2014-01-23 ENCOUNTER — Encounter (HOSPITAL_COMMUNITY): Payer: Self-pay | Admitting: Hematology

## 2014-01-23 ENCOUNTER — Telehealth (HOSPITAL_COMMUNITY): Payer: Self-pay | Admitting: *Deleted

## 2014-01-23 DIAGNOSIS — F17200 Nicotine dependence, unspecified, uncomplicated: Secondary | ICD-10-CM | POA: Diagnosis present

## 2014-01-23 DIAGNOSIS — D5700 Hb-SS disease with crisis, unspecified: Secondary | ICD-10-CM

## 2014-01-23 DIAGNOSIS — Z833 Family history of diabetes mellitus: Secondary | ICD-10-CM | POA: Diagnosis not present

## 2014-01-23 DIAGNOSIS — Z881 Allergy status to other antibiotic agents status: Secondary | ICD-10-CM | POA: Insufficient documentation

## 2014-01-23 DIAGNOSIS — R0789 Other chest pain: Secondary | ICD-10-CM | POA: Diagnosis not present

## 2014-01-23 DIAGNOSIS — Z825 Family history of asthma and other chronic lower respiratory diseases: Secondary | ICD-10-CM | POA: Diagnosis not present

## 2014-01-23 DIAGNOSIS — D72829 Elevated white blood cell count, unspecified: Secondary | ICD-10-CM | POA: Diagnosis present

## 2014-01-23 DIAGNOSIS — G894 Chronic pain syndrome: Secondary | ICD-10-CM | POA: Diagnosis not present

## 2014-01-23 DIAGNOSIS — G8929 Other chronic pain: Secondary | ICD-10-CM | POA: Insufficient documentation

## 2014-01-23 MED ORDER — HYDROMORPHONE HCL PF 2 MG/ML IJ SOLN
3.0000 mg | Freq: Once | INTRAMUSCULAR | Status: AC
  Administered 2014-01-23: 3 mg via INTRAVENOUS
  Filled 2014-01-23: qty 2

## 2014-01-23 MED ORDER — OXYCODONE HCL 5 MG PO TABS
15.0000 mg | ORAL_TABLET | ORAL | Status: DC
  Administered 2014-01-23 (×2): 15 mg via ORAL
  Filled 2014-01-23 (×2): qty 3

## 2014-01-23 MED ORDER — HYDROMORPHONE HCL PF 2 MG/ML IJ SOLN
2.5000 mg | Freq: Once | INTRAMUSCULAR | Status: AC
  Administered 2014-01-23: 2.5 mg via INTRAVENOUS
  Filled 2014-01-23: qty 2

## 2014-01-23 MED ORDER — DEXTROSE-NACL 5-0.45 % IV SOLN
INTRAVENOUS | Status: DC
  Administered 2014-01-23: 16:00:00 via INTRAVENOUS

## 2014-01-23 MED ORDER — KETOROLAC TROMETHAMINE 30 MG/ML IJ SOLN
30.0000 mg | Freq: Once | INTRAMUSCULAR | Status: AC
  Administered 2014-01-23: 30 mg via INTRAVENOUS
  Filled 2014-01-23: qty 1

## 2014-01-23 MED ORDER — HYDROMORPHONE HCL PF 2 MG/ML IJ SOLN
2.0000 mg | Freq: Once | INTRAMUSCULAR | Status: AC
  Administered 2014-01-23: 2 mg via INTRAVENOUS
  Filled 2014-01-23: qty 1

## 2014-01-23 MED ORDER — FOLIC ACID 1 MG PO TABS: 1.0000 mg | ORAL_TABLET | Freq: Every day | ORAL | Status: DC

## 2014-01-23 NOTE — Discharge Instructions (Signed)
Sickle Cell Anemia, Adult °Sickle cell anemia is a condition in which red blood cells have an abnormal "sickle" shape. This abnormal shape shortens the cells' life span, which results in a lower than normal concentration of red blood cells in the blood. The sickle shape also causes the cells to clump together and block free blood flow through the blood vessels. As a result, the tissues and organs of the body do not receive enough oxygen. Sickle cell anemia causes organ damage and pain and increases the risk of infection. °CAUSES  °Sickle cell anemia is a genetic disorder. Those who receive two copies of the gene have the condition, and those who receive one copy have the trait. °RISK FACTORS °The sickle cell gene is most common in people whose families originated in [place]. Other areas of the globe where sickle cell trait occurs include the Mediterranean, [place] and [place], the [place], and the [place].  °SIGNS AND SYMPTOMS °· Pain, especially in the extremities, back, chest, or abdomen (common). The pain may start suddenly or may develop following an illness, especially if there is dehydration. Pain can also occur due to overexertion or exposure to extreme temperature changes. °· Frequent severe bacterial infections, especially certain types of pneumonia and meningitis. °· Pain and swelling in the hands and feet. °· Decreased activity.   °· Loss of appetite.   °· Change in behavior. °· Headaches. °· Seizures. °· Shortness of breath or difficulty breathing. °· Vision changes. °· Skin ulcers. °Those with the trait may not have symptoms or they may have mild symptoms.  °DIAGNOSIS  °Sickle cell anemia is diagnosed with blood tests that demonstrate the genetic trait. It is often diagnosed during the newborn period, due to mandatory testing nationwide. A variety of blood tests, X-rays, CT scans, MRI scans, ultrasounds, and lung function tests may also be done to monitor the condition. °TREATMENT  °Sickle  cell anemia may be treated with: °· Medicines. You may be given pain medicines, antibiotic medicines (to treat and prevent infections) or medicines to increase the production of certain types of hemoglobin. °· Fluids. °· Oxygen. °· Blood transfusions. °HOME CARE INSTRUCTIONS  °· Drink enough fluid to keep your urine clear or pale yellow. Increase your fluid intake in hot weather and during exercise. °· Do not smoke. Smoking lowers oxygen levels in the blood.   °· Only take over-the-counter or prescription medicines for pain, fever, or discomfort as directed by your health care provider. °· Take antibiotics as directed by your health care provider. Make sure you finish them it even if you start to feel better.   °· Take supplements as directed by your health care provider.   °· Consider wearing a medical alert bracelet. This tells anyone caring for you in an emergency of your condition.   °· When traveling, keep your medical information, health care provider's names, and the medicines you take with you at all times.   °· If you develop a fever, do not take medicines to reduce the fever right away. This could cover up a problem that is developing. Notify your health care provider. °· Keep all follow-up appointments with your health care provider. Sickle cell anemia requires regular medical care. °SEEK MEDICAL CARE IF: ° You have a fever. °SEEK IMMEDIATE MEDICAL CARE IF:  °· You feel dizzy or faint.   °· You have new abdominal pain, especially on the left side near the stomach area.   °· You develop a persistent, often uncomfortable and painful penile erection (priapism). If this is not treated immediately it   will lead to impotence.   °· You have numbness your arms or legs or you have a hard time moving them.   °· You have a hard time with speech.   °· You have a fever or persistent symptoms for more than 2-3 days.   °· You have a fever and your symptoms suddenly get worse.   °· You have signs or symptoms of infection.  These include:   °¨ Chills.   °¨ Abnormal tiredness (lethargy).   °¨ Irritability.   °¨ Poor eating.   °¨ Vomiting.   °· You develop pain that is not helped with medicine.   °· You develop shortness of breath. °· You have pain in your chest.   °· You are coughing up pus-like or bloody sputum.   °· You develop a stiff neck. °· Your feet or hands swell or have pain. °· Your abdomen appears bloated. °· You develop joint pain. °MAKE SURE YOU: °· Understand these instructions. °Document Released: 09/16/2005 Document Revised: 10/23/2013 Document Reviewed: 01/18/2013 °Exit[employer]® Patient Information ©2015 [employer]. This information is not intended to replace advice given to you by your health care provider. Make sure you discuss any questions you have with your health care provider. ° °

## 2014-01-23 NOTE — Discharge Summary (Signed)
Physician Discharge Summary  Alejandro Soto GBT:[id number] DOB: 1986-05-31 DOA: 01/23/2014  PCP: Alejandro A., MD  Admit date: 01/23/2014 Discharge date: 01/23/2014  Discharge Diagnoses:  Active Problems:   * No active hospital problems. *   Discharge Condition: Stable  Disposition:   Diet:Regular Wt Readings from Last 3 Encounters:  01/23/14 145 lb (65.772 kg)  12/12/13 145 lb (65.772 kg)  11/27/13 142 lb (64.411 kg)   Hospital Course:  Patient admitted to day infusion center for extended observation. He arrived at the day infusion center with less than a 4 hour window of treatment. He was treated aggressively with scheduled oral short acting medications and clinician assisted doses every 20 minutes for the 1st hour then on a hourly basis. Pain intensity remained 7/10, requested inpatient bed for a higher level of care for acute pain; there were no inpatient beds available. Patient states that he is able to manage at home at current pain intensity. He states that his mother is on her way to drive him home.  Patient advised to report to the emergency department if pain intensity increases. I also recommended that he continue Oxycodone 15 mg every 4 hours as needed. Also, will  increase current dose of  Methadone to q 12 hours for 5 days as previously outlined by Dr. Zigmund Soto. Checked baseline EKG in light of increasing methadone. Will notify patient in the am to schedule an office visit for outpatient evaluation.   Discharge Exam:  Filed Vitals:   01/23/14 1923  BP: 125/75  Pulse: 68  Temp: 98.3 F (36.8 C)  Resp:    Filed Vitals:   01/23/14 1523 01/23/14 1923  BP: 119/75 125/75  Pulse: 71 68  Temp: 98.5 F (36.9 C) 98.3 F (36.8 C)  TempSrc: Oral Tympanic  Resp: 20   Height: 5\' 8"  (1.727 m)   Weight: 145 lb (65.772 kg)   SpO2: 100% 100%     General: Alert, awake, oriented x3, in no acute distress.  HEENT: [name]/AT PEERL, EOMI Heart: Regular rate and rhythm, without  murmurs, rubs, gallops, PMI non-displaced, no heaves or thrills on palpation.  Lungs: Clear to auscultation, no wheezing or rhonchi noted. No increased vocal fremitus resonant to percussion  Neuro: No focal neurological deficits noted cranial nerves II through XII grossly intact. DTRs 2+ bilaterally upper and lower extremities. Strength 5 out of 5 in bilateral upper and lower extremities. Musculoskeletal: No warm swelling or erythema around joints, no spinal tenderness noted.  Discharge Instructions     Medication List    TAKE these medications       folic acid 1 MG tablet  Commonly known as:  FOLVITE  Take 1 mg by mouth daily.     gabapentin 300 MG capsule  Commonly known as:  NEURONTIN  Take 1 capsule (300 mg total) by mouth 2 (two) times daily.     hydroxyurea 500 MG capsule  Commonly known as:  HYDREA  Take 1,500 mg by mouth daily. May take with food to minimize GI side effects.     ibuprofen 600 MG tablet  Commonly known as:  ADVIL,MOTRIN  Take 1 tablet (600 mg total) by mouth every 6 (six) hours as needed for mild pain or moderate pain.     methadone 5 MG tablet  Commonly known as:  DOLOPHINE  Take 1 tablet (5 mg total) by mouth at bedtime.     multivitamin tablet  Take 2 tablets by mouth daily.     oxyCODONE 15 MG  immediate release tablet  Commonly known as:  ROXICODONE  Take 1 tablet (15 mg total) by mouth every 4 (four) hours as needed for pain.      ASK your doctor about these medications       buPROPion 150 MG 12 hr tablet  Commonly known as:  ZYBAN  Take 1 tablet (150 mg total) by mouth 2 (two) times daily. Take 1 tab daily x 3 days then increase to 1 tab BID. Doses should be separated by at least 8 hours. Last dose no later than 6:00pm. Stop smoking after 5 days of starting tx.          The results of significant diagnostics from this hospitalization (including imaging, microbiology, ancillary and laboratory) are listed below for reference.     Significant Diagnostic Studies: No results found.  Microbiology: No results found for this or any previous visit (from the past 240 hour(s)).   Labs: Basic Metabolic Panel: No results found for this basename: NA, K, CL, CO2, GLUCOSE, BUN, CREATININE, CALCIUM, MG, PHOS,  in the last 168 hours Liver Function Tests: No results found for this basename: AST, ALT, ALKPHOS, BILITOT, PROT, ALBUMIN,  in the last 168 hours No results found for this basename: LIPASE, AMYLASE,  in the last 168 hours No results found for this basename: AMMONIA,  in the last 168 hours CBC: No results found for this basename: WBC, NEUTROABS, HGB, HCT, MCV, PLT,  in the last 168 hours Cardiac Enzymes: No results found for this basename: CKTOTAL, CKMB, CKMBINDEX, TROPONINI,  in the last 168 hours BNP: No components found with this basename: POCBNP,  CBG: No results found for this basename: GLUCAP,  in the last 168 hours Ferritin: No results found for this basename: FERRITIN,  in the last 168 hours  Time coordinating discharge: Greater than 30 minutes  Signed:  Wilho Soto M  01/23/2014, 7:36 PM

## 2014-01-23 NOTE — Progress Notes (Signed)
Patient ID: Alejandro Soto, male   DOB: [birth year], 28 y.o.   MRN: [id number] Discharge instructions given to patient.  Patient advised to call office tomorrow to make a follow up appointment for next week.  Patient verbalizes understanding.  Patient instructed to take medications as the physician discussed, patient instructed if pain becomes intolerable tonight to go to the ED, patient also instructed that if pain is not controlled with prescribed medications to call the [facility] tomorrow morning.  Patient verbalizes understanding.  Patient requested to wait outside for ride.  IV removed, and patient ambulated out of [facility].

## 2014-01-23 NOTE — Telephone Encounter (Addendum)
Instructed patient to come to day hospital. Patient acknowledges. Patient states he will arrive in about an hour.

## 2014-01-23 NOTE — H&P (Signed)
[place] [facility] Physical  Kirtis Challis UXN:[id number] DOB: [birth year] DOA: 01/23/2014   PCP: Digna Countess A., MD   Chief Complaint: Hb SS with pain  HPI:Pt reports that he as been having pain consistent with Sickle Cell Disease for 4 days and was trying to control it with his usual oral medications. The patient did not have transportation means to get here and called today as he was able to get a ride. He states that pain is localized to his chest wall, back and shoulder. He rates the pain at 8/10 and described as throbbing and sharp. He has had no other associated symptoms. Pt has had chronic pain and is scheduled to go to a pain clinic.    Review of Systems:  Constitutional: No weight loss, night sweats, Fevers, chills, fatigue.  HEENT: (+) headaches. No dizziness, seizures, vision changes, difficulty swallowing,Tooth/dental problems,Sore throat, No sneezing, itching, ear ache, nasal congestion, post nasal drip,  Cardio-vascular: No chest pain, Orthopnea, PND, swelling in lower extremities, anasarca, dizziness, palpitations  GI: No heartburn, indigestion, abdominal pain, nausea, vomiting, diarrhea, change in bowel habits, loss of appetite  Resp: No shortness of breath with exertion or at rest. No excess mucus, no productive cough, No non-productive cough, No coughing up of blood.No change in color of mucus.No wheezing.No chest wall deformity  Skin: no rash or lesions.  GU: no dysuria, change in color of urine, no urgency or frequency. No flank pain.  Psych: No change in mood or affect. No depression or anxiety. No memory loss.    Past Medical History  Diagnosis Date  . Sickle cell anemia   . Pneumonia    Past Surgical History  Procedure Laterality Date  . No past surgeries     Social History:  reports that he has been smoking Cigarettes.  He has been smoking about 0.00 packs per day for the past 5 years. He has never used smokeless tobacco. He reports that  he does not drink alcohol or use illicit drugs.  Allergies  Allergen Reactions  . Amoxicillin Diarrhea    Family History  Problem Relation Age of Onset  . Diabetes Father   . Cancer Father 103    Prostate  . Asthma Brother   . Cancer Maternal Grandmother     colon     Prior to Admission medications   Medication Sig Start Date End Date Taking? Authorizing Provider  folic acid (FOLVITE) 1 MG tablet Take 1 mg by mouth daily.   Yes Historical Provider, MD  gabapentin (NEURONTIN) 300 MG capsule Take 1 capsule (300 mg total) by mouth 2 (two) times daily. 09/07/13  Yes Leana Gamer, MD  hydroxyurea (HYDREA) 500 MG capsule Take 1,500 mg by mouth daily. May take with food to minimize GI side effects.   Yes Historical Provider, MD  ibuprofen (ADVIL,MOTRIN) 600 MG tablet Take 1 tablet (600 mg total) by mouth every 6 (six) hours as needed for mild pain or moderate pain. 12/15/13  Yes Leana Gamer, MD  methadone (DOLOPHINE) 5 MG tablet Take 1 tablet (5 mg total) by mouth at bedtime. 01/15/14  Yes Leana Gamer, MD  Multiple Vitamin (MULTIVITAMIN) tablet Take 2 tablets by mouth daily.    Yes Historical Provider, MD  oxyCODONE (ROXICODONE) 15 MG immediate release tablet Take 1 tablet (15 mg total) by mouth every 4 (four) hours as needed for pain. 01/15/14  Yes Leana Gamer, MD   Physical Exam: Filed Vitals:   01/23/14 1523  BP: 119/75  Pulse: 71  Temp: 98.5 F (36.9 C)  TempSrc: Oral  Resp: 20  Height: 5\' 8"  (1.727 m)  Weight: 145 lb (65.772 kg)  SpO2: 100%   General: Alert, awake, oriented x3, in no acute distress.  HEENT: [place]/AT PEERL, EOMI, anicteric Neck: Trachea midline,  no masses, no thyromegal,y no JVD, no carotid bruit OROPHARYNX:  Moist, No exudate/ erythema/lesions.  Heart: Regular rate and rhythm, without murmurs, rubs, gallops.  Lungs: Clear to auscultation, no wheezing or rhonchi noted.  Abdomen: Soft, nontender, nondistended, positive bowel sounds,  no masses no hepatosplenomegaly noted..  Neuro: No focal neurological deficits noted cranial nerves II through XII grossly intact.      Assessment/Plan: Active Problems:   1. Hb SS with crisis: Pt has arrived at the [facility] with less than a 4 hour window of treatment. I will treat aggressively with scheduled oral short acting medications and clinician assisted doses every 20 minutes for the 1st hour then on a hourly basis if pain still >7/10. I will also treat with Toradol. Also give hypotonic IVF D5.45 to assist hydrophobic cells with hydration.    2. Chronic Pain: Will likely consider increasing Methadone to q 12 hours. In light of this I will obtain 12 lead EKG to evaluate QTC.  Time spend: 45 minutes Code Status: Full Code Family Communication: N/A Disposition Plan:  Likely home after treatment.  Trinnity Breunig A., MD  Pager [phone]  If 7PM-7AM, please contact night-coverage [email or web address] Password [facility] 01/23/2014, 4:34 PM

## 2014-01-23 NOTE — Telephone Encounter (Signed)
Received patient call, patient c/o pain to Left Shoulder, stomach, and chest since Friday; Rating pain 8/10. Patient reports stomach pain as sharp and all over; chest pain as mid and aching; reports feels same as sickle cell crisis. Patient reports taking oxycodone, ibuprofen, and methadone with no improvement. Patient denies all other pertinent ROS per [facility] Triage Assessment. Informed patient that this RN will notify provider on call and patient is to receive a return call back with recommendation. Patient acknowledges.

## 2014-01-24 ENCOUNTER — Encounter (HOSPITAL_COMMUNITY): Payer: Self-pay

## 2014-01-24 ENCOUNTER — Telehealth (HOSPITAL_COMMUNITY): Payer: Self-pay | Admitting: *Deleted

## 2014-01-24 ENCOUNTER — Inpatient Hospital Stay (HOSPITAL_COMMUNITY)
Admission: AD | Admit: 2014-01-24 | Discharge: 2014-01-28 | DRG: 812 | Disposition: A | Discharge status: Home / Self Care | Payer: Medicare Other | Source: Ambulatory Visit | Attending: Internal Medicine | Admitting: Internal Medicine

## 2014-01-24 DIAGNOSIS — G894 Chronic pain syndrome: Secondary | ICD-10-CM

## 2014-01-24 DIAGNOSIS — D5700 Hb-SS disease with crisis, unspecified: Secondary | ICD-10-CM | POA: Diagnosis not present

## 2014-01-24 DIAGNOSIS — R0789 Other chest pain: Secondary | ICD-10-CM | POA: Diagnosis not present

## 2014-01-24 DIAGNOSIS — F17200 Nicotine dependence, unspecified, uncomplicated: Secondary | ICD-10-CM | POA: Diagnosis present

## 2014-01-24 DIAGNOSIS — Z825 Family history of asthma and other chronic lower respiratory diseases: Secondary | ICD-10-CM | POA: Diagnosis not present

## 2014-01-24 DIAGNOSIS — Z833 Family history of diabetes mellitus: Secondary | ICD-10-CM | POA: Diagnosis not present

## 2014-01-24 DIAGNOSIS — Z881 Allergy status to other antibiotic agents status: Secondary | ICD-10-CM

## 2014-01-24 DIAGNOSIS — D72829 Elevated white blood cell count, unspecified: Secondary | ICD-10-CM | POA: Diagnosis present

## 2014-01-24 LAB — COMPREHENSIVE METABOLIC PANEL
ALK PHOS: 52 U/L (ref 39–117)
ALT: 36 U/L (ref 0–53)
AST: 39 U/L — AB (ref 0–37)
Albumin: 3.9 g/dL (ref 3.5–5.2)
Anion gap: 10 (ref 5–15)
BUN: 12 mg/dL (ref 6–23)
CALCIUM: 9.5 mg/dL (ref 8.4–10.5)
CO2: 24 meq/L (ref 19–32)
Chloride: 105 mEq/L (ref 96–112)
Creatinine, Ser: 0.66 mg/dL (ref 0.50–1.35)
GFR calc Af Amer: >90 mL/min (ref >90)
Glucose, Bld: 101 mg/dL — ABNORMAL HIGH (ref 70–99)
Potassium: 4.2 mEq/L (ref 3.7–5.3)
SODIUM: 139 meq/L (ref 137–147)
Total Bilirubin: 0.8 mg/dL (ref 0.3–1.2)
Total Protein: 7.2 g/dL (ref 6.0–8.3)

## 2014-01-24 LAB — CBC WITH DIFFERENTIAL/PLATELET
BASOS ABS: 0 10*3/uL (ref 0.0–0.1)
Basophils Relative: 0 % (ref 0–1)
EOS ABS: 0.2 10*3/uL (ref 0.0–0.7)
EOS PCT: 2 % (ref 0–5)
HEMATOCRIT: 30.7 % — AB (ref 39.0–52.0)
Hemoglobin: 10.8 g/dL — ABNORMAL LOW (ref 13.0–17.0)
LYMPHS PCT: 27 % (ref 12–46)
Lymphs Abs: 2.6 10*3/uL (ref 0.7–4.0)
MCH: 33.8 pg (ref 26.0–34.0)
MCHC: 35.2 g/dL (ref 30.0–36.0)
MCV: 95.9 fL (ref 78.0–100.0)
MONO ABS: 1.1 10*3/uL — AB (ref 0.1–1.0)
Monocytes Relative: 12 % (ref 3–12)
Neutro Abs: 5.6 10*3/uL (ref 1.7–7.7)
Neutrophils Relative %: 59 % (ref 43–77)
PLATELETS: 253 10*3/uL (ref 150–400)
RBC: 3.2 MIL/uL — ABNORMAL LOW (ref 4.22–5.81)
RDW: 14.5 % (ref 11.5–15.5)
WBC: 9.5 10*3/uL (ref 4.0–10.5)

## 2014-01-24 LAB — RETICULOCYTES
RBC.: 3.2 MIL/uL — ABNORMAL LOW (ref 4.22–5.81)
Retic Count, Absolute: 262.4 10*3/uL — ABNORMAL HIGH (ref 19.0–186.0)
Retic Ct Pct: 8.2 % — ABNORMAL HIGH (ref 0.4–3.1)

## 2014-01-24 LAB — MAGNESIUM: Magnesium: 2 mg/dL (ref 1.5–2.5)

## 2014-01-24 MED ORDER — FOLIC ACID 1 MG PO TABS
1.0000 mg | ORAL_TABLET | Freq: Every day | ORAL | Status: DC
  Administered 2014-01-25 – 2014-01-27 (×3): 1 mg via ORAL
  Filled 2014-01-24 (×4): qty 1

## 2014-01-24 MED ORDER — SENNOSIDES-DOCUSATE SODIUM 8.6-50 MG PO TABS
1.0000 | ORAL_TABLET | Freq: Two times a day (BID) | ORAL | Status: DC
  Administered 2014-01-24 – 2014-01-27 (×4): 1 via ORAL
  Filled 2014-01-24 (×11): qty 1

## 2014-01-24 MED ORDER — KETOROLAC TROMETHAMINE 30 MG/ML IJ SOLN
30.0000 mg | Freq: Four times a day (QID) | INTRAMUSCULAR | Status: DC
  Administered 2014-01-24 – 2014-01-28 (×16): 30 mg via INTRAVENOUS
  Filled 2014-01-24: qty 1
  Filled 2014-01-24: qty 2
  Filled 2014-01-24 (×19): qty 1

## 2014-01-24 MED ORDER — HYDROMORPHONE HCL PF 2 MG/ML IJ SOLN
2.0000 mg | Freq: Once | INTRAMUSCULAR | Status: AC
  Administered 2014-01-24: 2 mg via INTRAVENOUS
  Filled 2014-01-24: qty 1

## 2014-01-24 MED ORDER — FOLIC ACID 1 MG PO TABS
1.0000 mg | ORAL_TABLET | Freq: Every day | ORAL | Status: DC
  Filled 2014-01-24: qty 1

## 2014-01-24 MED ORDER — HYDROMORPHONE 0.3 MG/ML IV SOLN
INTRAVENOUS | Status: DC
  Administered 2014-01-24 (×2): via INTRAVENOUS
  Administered 2014-01-24: 7.5 mg via INTRAVENOUS
  Administered 2014-01-24: 14.99 mg via INTRAVENOUS
  Administered 2014-01-24: 7.6 mg via INTRAVENOUS
  Administered 2014-01-24: 15:00:00 via INTRAVENOUS
  Administered 2014-01-24: 7.06 mg via INTRAVENOUS
  Administered 2014-01-24: 18:00:00 via INTRAVENOUS
  Administered 2014-01-25: 7.5 mg via INTRAVENOUS
  Administered 2014-01-25: 05:00:00 via INTRAVENOUS
  Administered 2014-01-25: 7.5 mg via INTRAVENOUS
  Administered 2014-01-25: 7.6 mg via INTRAVENOUS
  Administered 2014-01-25: 7.5 mg via INTRAVENOUS
  Administered 2014-01-25: 22:00:00 via INTRAVENOUS
  Administered 2014-01-25: 7.6 mg via INTRAVENOUS
  Administered 2014-01-25 (×2): 7.5 mg via INTRAVENOUS
  Administered 2014-01-26: 20:00:00 via INTRAVENOUS
  Administered 2014-01-26: 13.2 mg via INTRAVENOUS
  Administered 2014-01-26: 03:00:00 via INTRAVENOUS
  Administered 2014-01-26: 19.48 mg via INTRAVENOUS
  Administered 2014-01-26 (×2): via INTRAVENOUS
  Administered 2014-01-26: 14.07 mg via INTRAVENOUS
  Administered 2014-01-26: 9.11 mg via INTRAVENOUS
  Administered 2014-01-26: 8.64 mg via INTRAVENOUS
  Administered 2014-01-26 – 2014-01-27 (×8): via INTRAVENOUS
  Administered 2014-01-27: 22.04 mg via INTRAVENOUS
  Administered 2014-01-27: 04:00:00 via INTRAVENOUS
  Filled 2014-01-24 (×27): qty 25

## 2014-01-24 MED ORDER — HYDROXYUREA 500 MG PO CAPS
1500.0000 mg | ORAL_CAPSULE | Freq: Every day | ORAL | Status: DC
  Administered 2014-01-25 – 2014-01-27 (×3): 1500 mg via ORAL
  Filled 2014-01-24 (×4): qty 3

## 2014-01-24 MED ORDER — SODIUM CHLORIDE 0.9 % IJ SOLN: 9.0000 mL | INTRAMUSCULAR | Status: DC | PRN

## 2014-01-24 MED ORDER — DIPHENHYDRAMINE HCL 25 MG PO CAPS: 25.0000 mg | ORAL_CAPSULE | Freq: Four times a day (QID) | ORAL | Status: DC | PRN

## 2014-01-24 MED ORDER — NALOXONE HCL 0.4 MG/ML IJ SOLN: 0.4000 mg | INTRAMUSCULAR | Status: DC | PRN

## 2014-01-24 MED ORDER — GABAPENTIN 300 MG PO CAPS
300.0000 mg | ORAL_CAPSULE | Freq: Two times a day (BID) | ORAL | Status: DC
  Filled 2014-01-24 (×2): qty 1

## 2014-01-24 MED ORDER — DEXTROSE-NACL 5-0.45 % IV SOLN
INTRAVENOUS | Status: AC
  Administered 2014-01-24 (×2): via INTRAVENOUS
  Administered 2014-01-25: 1000 mL via INTRAVENOUS
  Administered 2014-01-25: 07:00:00 via INTRAVENOUS

## 2014-01-24 MED ORDER — GABAPENTIN 300 MG PO CAPS
300.0000 mg | ORAL_CAPSULE | Freq: Two times a day (BID) | ORAL | Status: DC
  Administered 2014-01-24 – 2014-01-27 (×7): 300 mg via ORAL
  Filled 2014-01-24 (×9): qty 1

## 2014-01-24 MED ORDER — ONDANSETRON HCL 4 MG/2ML IJ SOLN: 4.0000 mg | Freq: Four times a day (QID) | INTRAMUSCULAR | Status: DC | PRN

## 2014-01-24 NOTE — Telephone Encounter (Signed)
Received patient call. Patient c/o continues to have pain to back and legs, rating pain 8/10. MD notified. Instructed patient to come to day hospital. Patient acknowledges.

## 2014-01-24 NOTE — H&P (Signed)
Hospital Admission Note Date: 01/24/2014  Patient name: Alejandro Soto Medical record number: [id number] Date of birth: 06-18-1986 Age: 28 y.o. Gender: male PCP: MATTHEWS,MICHELLE A., MD  Attending physician: Leana Gamer, MD  Chief Complaint:Pain in chest wall, back and shoulders x 5 days  History of Present Illness: This is my patient in the ambulatory setting who is being diretly admitted for Hb SS with crisis which has not resolved with aggressive out patient management. Pt was treated in the [facility] yesterday when he  reported that he as been having pain consistent with Sickle Cell Disease for 4 days and was trying to control it with his usual oral medications. The patient did not have transportation means to get to the [facility] and had to take public transportation which delayed his arrival at the [facility]. He received aggressive treatment however he is still having pain 9/10 localized to his chest wall, back and shoulder. He rates the pain at 9/10 and described as throbbing and sharp. He has had no other associated symptoms.   Pt has had chronic pain and is scheduled to go to a pain clinic.      Scheduled Meds: . folic acid  1 mg Oral Daily  . gabapentin  300 mg Oral BID  .  HYDROmorphone (DILAUDID) injection  2 mg Intravenous Once  . HYDROmorphone PCA 0.3 mg/mL   Intravenous 6 times per day  . hydroxyurea  1,500 mg Oral Daily  . ketorolac  30 mg Intravenous 4 times per day  . senna-docusate  1 tablet Oral BID   Continuous Infusions: . dextrose 5 % and 0.45% NaCl     PRN Meds:.diphenhydrAMINE, naloxone, ondansetron (ZOFRAN) IV, sodium chloride Allergies: Amoxicillin Past Medical History  Diagnosis Date  . Sickle cell anemia   . Pneumonia    Past Surgical History  Procedure Laterality Date  . No past surgeries     Family History  Problem Relation Age of Onset  . Diabetes Father   . Cancer Father 62    Prostate  . Asthma Brother   . Cancer Maternal  Grandmother     colon    History   Social History  . Marital Status: Single    Spouse Name: N/A    Number of Children: N/A  . Years of Education: N/A   Occupational History  . Not on file.   Social History Main Topics  . Smoking status: Current Some Day Smoker -- 5 years    Types: Cigarettes  . Smokeless tobacco: Never Used     Comment: 1-3 daily  . Alcohol Use: No  . Drug Use: No  . Sexual Activity: Yes   Other Topics Concern  . Not on file   Social History Narrative  . No narrative on file   Review of Systems: A comprehensive review of systems was negative except as noted in the HPI. Physical Exam: No intake or output data in the 24 hours ending 01/24/14 0955   General: Alert, awake, oriented x3, in no acute distress.  HEENT: [place]/AT PEERL, EOMI, anicteric Neck: Trachea midline,  no masses, no thyromegal,y no JVD, no carotid bruit OROPHARYNX:  Moist, No exudate/ erythema/lesions.  Heart: Regular rate and rhythm, without murmurs, rubs, gallops, PMI non-displaced, no heaves or thrills on palpation.  Lungs: Clear to auscultation, no wheezing or rhonchi noted. No increased vocal fremitus resonant to percussion  Abdomen: Soft, nontender, nondistended, positive bowel sounds, no masses no hepatosplenomegaly noted..  Neuro: No focal neurological  deficits noted cranial nerves II through XII grossly intact. Strength normal in bilateral upper and lower extremities. Musculoskeletal: No warm swelling or erythema around joints, no spinal tenderness noted. Psychiatric: Patient alert and oriented x3, good insight and cognition, good recent to remote recall. Lymph node survey: No cervical axillary or inguinal lymphadenopathy noted.     Assessment and Plan: 1. Hb SS with crisis:  Start patient on weight based PCA, Toradol and Continue long acting. Pt has been on Methadone 5 mg q HS but was instructed to take an additional 5 mg of Methadone this morning. I will reassess after 4-6  hours of treatment and make adjustments. Start IVF D5.45 at 100 ml/hr for the next 24 hours then reassess. -check CBC with diff, reticulocyte, Electrolytes and LDH  2. Sickle Cell Disease: - Continue Folic Acid and Hydrea.  3. Tobacco Use Disorder: - Will offer Nicoderm transdermal.  MATTHEWS,MICHELLE A. 01/24/2014, 9:55 AM

## 2014-01-25 MED ORDER — MORPHINE SULFATE ER 15 MG PO TBCR
15.0000 mg | EXTENDED_RELEASE_TABLET | Freq: Two times a day (BID) | ORAL | Status: DC
  Administered 2014-01-25 – 2014-01-26 (×3): 15 mg via ORAL
  Filled 2014-01-25 (×3): qty 1

## 2014-01-25 NOTE — Progress Notes (Signed)
01/25/14 16:15 UR complete.  Alejandro Soto, BSN, [place].

## 2014-01-25 NOTE — Progress Notes (Signed)
Subjective: A 28 yo with known history of Sickle cell disease admitted with sickle cell painful crisis. Patient is doing better on Dilaudid PCA. His pain is now at 6/10. He has used 60.1 mg of Dilaudid in the last 24 hours. Has no SOB, no cough, no fever. He has been started on MS Contin 15 mg BID today in place of Methadone. Not sure yet if it will work. No fever.  Objective: Vital signs in last 24 hours: Temp:  [97.7 F (36.5 C)-98.4 F (36.9 C)] 97.7 F (36.5 C) (08/06 0514) Pulse Rate:  [60-65] 62 (08/06 0514) Resp:  [12-18] 14 (08/06 0514) BP: (103-115)/(62-69) 103/62 mmHg (08/06 0514) SpO2:  [97 %-100 %] 100 % (08/06 0514) Weight:  [65.772 kg (145 lb)-66.271 kg (146 lb 1.6 oz)] 66.271 kg (146 lb 1.6 oz) (08/06 0514) Weight change:  Last BM Date: 01/22/14  Intake/Output from previous day: 08/05 0701 - 08/06 0700 In: 2518 [P.O.:702; I.V.:1816] Out: 1100 [Urine:1100] Intake/Output this shift:    General appearance: alert, cooperative and no distress Eyes: conjunctivae/corneas clear. PERRL, EOM's intact. Fundi benign. Throat: lips, mucosa, and tongue normal; teeth and gums normal Neck: no adenopathy, no carotid bruit, no JVD, supple, symmetrical, trachea midline and thyroid not enlarged, symmetric, no tenderness/mass/nodules Back: symmetric, no curvature. ROM normal. No CVA tenderness. Resp: clear to auscultation bilaterally Chest wall: no tenderness Cardio: regular rate and rhythm, S1, S2 normal, no murmur, click, rub or gallop GI: soft, non-tender; bowel sounds normal; no masses,  no organomegaly Extremities: extremities normal, atraumatic, no cyanosis or edema Pulses: 2+ and symmetric Skin: Skin color, texture, turgor normal. No rashes or lesions Neurologic: Grossly normal  Lab Results:  Recent Labs  01/24/14 1210  WBC 9.5  HGB 10.8*  HCT 30.7*  PLT 253   BMET  Recent Labs  01/24/14 1210  NA 139  K 4.2  CL 105  CO2 24  GLUCOSE 101*  BUN 12  CREATININE  0.66  CALCIUM 9.5    Studies/Results: No results found.  Medications: I have reviewed the patient's current medications.  Assessment/Plan: A 28 yo with sickle cell painful crisis.  #1 Sickle cell painful Crisis: Patient will be tried on the MS Contin with the Dilaudid PCA being titrated down. The dose will be titrated as necessary. Continue supportive measures.  #2  Chronic Pain syndrome: Patient scheduled to go to the pain clinic at [place]. Continue inpatient pain control.  #3 Tobacco use disorder: Counseled. Continue current Rx  #4 Sickle Cell anemia: H/H stable.  LOS: 1 day   GARBA,LAWAL 01/25/2014, 7:23 AM

## 2014-01-25 NOTE — Progress Notes (Signed)
Last 24 hr PCA  Total used was 60.1.

## 2014-01-26 DIAGNOSIS — G894 Chronic pain syndrome: Secondary | ICD-10-CM

## 2014-01-26 LAB — COMPREHENSIVE METABOLIC PANEL
ALT: 34 U/L (ref 0–53)
ANION GAP: 10 (ref 5–15)
AST: 44 U/L — ABNORMAL HIGH (ref 0–37)
Albumin: 3.4 g/dL — ABNORMAL LOW (ref 3.5–5.2)
Alkaline Phosphatase: 60 U/L (ref 39–117)
BILIRUBIN TOTAL: 0.7 mg/dL (ref 0.3–1.2)
BUN: 7 mg/dL (ref 6–23)
CO2: 24 meq/L (ref 19–32)
CREATININE: 0.71 mg/dL (ref 0.50–1.35)
Calcium: 8.6 mg/dL (ref 8.4–10.5)
Chloride: 103 mEq/L (ref 96–112)
GFR calc non Af Amer: >90 mL/min (ref >90)
GLUCOSE: 112 mg/dL — AB (ref 70–99)
Potassium: 3.9 mEq/L (ref 3.7–5.3)
Sodium: 137 mEq/L (ref 137–147)
Total Protein: 6.3 g/dL (ref 6.0–8.3)

## 2014-01-26 LAB — CBC WITH DIFFERENTIAL/PLATELET
Basophils Absolute: 0 10*3/uL (ref 0.0–0.1)
Basophils Relative: 0 % (ref 0–1)
EOS PCT: 2 % (ref 0–5)
Eosinophils Absolute: 0.2 10*3/uL (ref 0.0–0.7)
HEMATOCRIT: 27.9 % — AB (ref 39.0–52.0)
Hemoglobin: 9.8 g/dL — ABNORMAL LOW (ref 13.0–17.0)
LYMPHS ABS: 3.6 10*3/uL (ref 0.7–4.0)
Lymphocytes Relative: 30 % (ref 12–46)
MCH: 33.7 pg (ref 26.0–34.0)
MCHC: 35.1 g/dL (ref 30.0–36.0)
MCV: 95.9 fL (ref 78.0–100.0)
MONO ABS: 1.5 10*3/uL — AB (ref 0.1–1.0)
Monocytes Relative: 12 % (ref 3–12)
NEUTROS ABS: 6.8 10*3/uL (ref 1.7–7.7)
Neutrophils Relative %: 56 % (ref 43–77)
Platelets: 245 10*3/uL (ref 150–400)
RBC: 2.91 MIL/uL — ABNORMAL LOW (ref 4.22–5.81)
RDW: 14.7 % (ref 11.5–15.5)
WBC: 12.1 10*3/uL — AB (ref 4.0–10.5)

## 2014-01-26 MED ORDER — MORPHINE SULFATE ER 30 MG PO TBCR
30.0000 mg | EXTENDED_RELEASE_TABLET | Freq: Two times a day (BID) | ORAL | Status: DC
Start: 2014-01-26 — End: 2014-01-28
  Administered 2014-01-26 – 2014-01-27 (×3): 30 mg via ORAL
  Filled 2014-01-26 (×3): qty 1

## 2014-01-26 NOTE — Progress Notes (Signed)
Subjective: Patient still having a 7/10 pain all over. Has been on MS Contin 15 mg BID in place of Methadone with the PCA. He has used 45 mg of Dilaudid in the last 24 hours with the MS Contin. No fever, no SOB.   Objective: Vital signs in last 24 hours: Temp:  [97.6 F (36.4 C)-98.5 F (36.9 C)] 98.4 F (36.9 C) (08/07 1022) Pulse Rate:  [64-74] 74 (08/07 1022) Resp:  [15-20] 19 (08/07 1143) BP: (116-120)/(61-74) 116/61 mmHg (08/07 1022) SpO2:  [97 %-100 %] 97 % (08/07 1143) Weight change:  Last BM Date: 01/25/14  Intake/Output from previous day: 08/06 0701 - 08/07 0700 In: 1279.5 [P.O.:1080; I.V.:199.5] Out: 1840 [Urine:1840] Intake/Output this shift: Total I/O In: 240 [P.O.:240] Out: 1000 [Urine:1000]  General appearance: alert, cooperative and no distress Eyes: conjunctivae/corneas clear. PERRL, EOM's intact. Fundi benign. Throat: lips, mucosa, and tongue normal; teeth and gums normal Neck: no adenopathy, no carotid bruit, no JVD, supple, symmetrical, trachea midline and thyroid not enlarged, symmetric, no tenderness/mass/nodules Back: symmetric, no curvature. ROM normal. No CVA tenderness. Resp: clear to auscultation bilaterally Chest wall: no tenderness Cardio: regular rate and rhythm, S1, S2 normal, no murmur, click, rub or gallop GI: soft, non-tender; bowel sounds normal; no masses,  no organomegaly Extremities: extremities normal, atraumatic, no cyanosis or edema Pulses: 2+ and symmetric Skin: Skin color, texture, turgor normal. No rashes or lesions Neurologic: Grossly normal  Lab Results:  Recent Labs  01/24/14 1210 01/26/14 0440  WBC 9.5 12.1*  HGB 10.8* 9.8*  HCT 30.7* 27.9*  PLT 253 245   BMET  Recent Labs  01/24/14 1210 01/26/14 0440  NA 139 137  K 4.2 3.9  CL 105 103  CO2 24 24  GLUCOSE 101* 112*  BUN 12 7  CREATININE 0.66 0.71  CALCIUM 9.5 8.6    Studies/Results: No results found.  Medications: I have reviewed the patient's current  medications.  Assessment/Plan: A 28 yo with sickle cell painful crisis.  #1 Sickle cell painful Crisis: Will increase the dose of the MS Contin to 30 mg BID. Depending on how patient respond, we will begin titrating down his PCA in anticipation of discharge this weekend. Continue supportive measures.  #2  Chronic Pain syndrome: Patient scheduled to go to the pain clinic at [place]. Continue inpatient pain control.  #3 Tobacco use disorder: Counseled. Continue current Rx  #4 Sickle Cell anemia: H/H stable.  LOS: 2 days   Amer Alcindor,LAWAL 01/26/2014, 11:48 AM

## 2014-01-27 LAB — COMPREHENSIVE METABOLIC PANEL
ALT: 102 U/L — AB (ref 0–53)
AST: 124 U/L — ABNORMAL HIGH (ref 0–37)
Albumin: 3.4 g/dL — ABNORMAL LOW (ref 3.5–5.2)
Alkaline Phosphatase: 71 U/L (ref 39–117)
Anion gap: 12 (ref 5–15)
BUN: 6 mg/dL (ref 6–23)
CALCIUM: 8.9 mg/dL (ref 8.4–10.5)
CO2: 24 meq/L (ref 19–32)
Chloride: 102 mEq/L (ref 96–112)
Creatinine, Ser: 0.67 mg/dL (ref 0.50–1.35)
GFR calc Af Amer: >90 mL/min (ref >90)
Glucose, Bld: 90 mg/dL (ref 70–99)
Potassium: 4.3 mEq/L (ref 3.7–5.3)
SODIUM: 138 meq/L (ref 137–147)
Total Bilirubin: 1.1 mg/dL (ref 0.3–1.2)
Total Protein: 6.6 g/dL (ref 6.0–8.3)

## 2014-01-27 LAB — CBC WITH DIFFERENTIAL/PLATELET
BASOS ABS: 0 10*3/uL (ref 0.0–0.1)
Basophils Relative: 0 % (ref 0–1)
EOS PCT: 3 % (ref 0–5)
Eosinophils Absolute: 0.3 10*3/uL (ref 0.0–0.7)
HCT: 27.1 % — ABNORMAL LOW (ref 39.0–52.0)
Hemoglobin: 9.9 g/dL — ABNORMAL LOW (ref 13.0–17.0)
Lymphocytes Relative: 34 % (ref 12–46)
Lymphs Abs: 4 10*3/uL (ref 0.7–4.0)
MCH: 35 pg — ABNORMAL HIGH (ref 26.0–34.0)
MCHC: 36.5 g/dL — ABNORMAL HIGH (ref 30.0–36.0)
MCV: 95.8 fL (ref 78.0–100.0)
Monocytes Absolute: 1.3 10*3/uL — ABNORMAL HIGH (ref 0.1–1.0)
Monocytes Relative: 11 % (ref 3–12)
NEUTROS ABS: 6.1 10*3/uL (ref 1.7–7.7)
Neutrophils Relative %: 52 % (ref 43–77)
Platelets: 259 10*3/uL (ref 150–400)
RBC: 2.83 MIL/uL — ABNORMAL LOW (ref 4.22–5.81)
RDW: 14.4 % (ref 11.5–15.5)
WBC: 11.6 10*3/uL — AB (ref 4.0–10.5)

## 2014-01-27 MED ORDER — HYDROMORPHONE 0.3 MG/ML IV SOLN
INTRAVENOUS | Status: DC
  Administered 2014-01-27 (×2): via INTRAVENOUS
  Administered 2014-01-27: 13.34 mg via INTRAVENOUS
  Administered 2014-01-27: 11.49 mg via INTRAVENOUS
  Administered 2014-01-27: 7.49 mg via INTRAVENOUS
  Administered 2014-01-28: 8.49 mg via INTRAVENOUS
  Administered 2014-01-28: 9.99 mg via INTRAVENOUS
  Administered 2014-01-28: 08:00:00 via INTRAVENOUS
  Administered 2014-01-28: 7.65 mg via INTRAVENOUS
  Administered 2014-01-28: 01:00:00 via INTRAVENOUS
  Filled 2014-01-27 (×8): qty 25

## 2014-01-27 MED ORDER — OXYCODONE HCL 5 MG PO TABS
15.0000 mg | ORAL_TABLET | ORAL | Status: DC
  Administered 2014-01-27 – 2014-01-28 (×3): 15 mg via ORAL
  Filled 2014-01-27 (×3): qty 3

## 2014-01-27 NOTE — Progress Notes (Signed)
Subjective: Patient still having a 6/10 pain all over. He is feeling better on the oral medications but has been using his PCA more. No fever, no NVD. He has continued to eat drink and perform his ADL.  Objective: Vital signs in last 24 hours: Temp:  [97.9 F (36.6 C)-98.4 F (36.9 C)] 98.4 F (36.9 C) (08/08 1010) Pulse Rate:  [72-93] 93 (08/08 1010) Resp:  [13-19] 16 (08/08 1010) BP: (117-139)/(59-86) 139/59 mmHg (08/08 1010) SpO2:  [97 %-100 %] 99 % (08/08 1010) Weight change:  Last BM Date: 01/26/14  Intake/Output from previous day: 08/07 0701 - 08/08 0700 In: 1340 [P.O.:1340] Out: 3200 [Urine:3200] Intake/Output this shift: Total I/O In: -  Out: 600 [Urine:600]  General appearance: alert, cooperative and no distress Eyes: conjunctivae/corneas clear. PERRL, EOM's intact. Fundi benign. Throat: lips, mucosa, and tongue normal; teeth and gums normal Neck: no adenopathy, no carotid bruit, no JVD, supple, symmetrical, trachea midline and thyroid not enlarged, symmetric, no tenderness/mass/nodules Back: symmetric, no curvature. ROM normal. No CVA tenderness. Resp: clear to auscultation bilaterally Chest wall: no tenderness Cardio: regular rate and rhythm, S1, S2 normal, no murmur, click, rub or gallop GI: soft, non-tender; bowel sounds normal; no masses,  no organomegaly Extremities: extremities normal, atraumatic, no cyanosis or edema Pulses: 2+ and symmetric Skin: Skin color, texture, turgor normal. No rashes or lesions Neurologic: Grossly normal  Lab Results:  Recent Labs  01/26/14 0440 01/27/14 0545  WBC 12.1* 11.6*  HGB 9.8* 9.9*  HCT 27.9* 27.1*  PLT 245 259   BMET  Recent Labs  01/26/14 0440 01/27/14 0545  NA 137 138  K 3.9 4.3  CL 103 102  CO2 24 24  GLUCOSE 112* 90  BUN 7 6  CREATININE 0.71 0.67  CALCIUM 8.6 8.9    Studies/Results: No results found.  Medications: I have reviewed the patient's current medications.  Assessment/Plan: A 28 yo  with sickle cell painful crisis.  #1 Sickle cell painful Crisis: Patient has improved. Will add MS IR for breakthrough pain and titrate the PCA down in preparation for [place].  #2  Chronic Pain syndrome: Out patient Pain clinic referral.  #3 Tobacco use disorder: Counseled.   #4 Sickle Cell anemia: H/H stable.  #5 Leucocytosis: Improving. Due to VOC  LOS: 3 days   Syrita Dovel,LAWAL 01/27/2014, 10:33 AM

## 2014-01-28 LAB — COMPREHENSIVE METABOLIC PANEL
ALT: 115 U/L — ABNORMAL HIGH (ref 0–53)
AST: 98 U/L — ABNORMAL HIGH (ref 0–37)
Albumin: 3.4 g/dL — ABNORMAL LOW (ref 3.5–5.2)
Alkaline Phosphatase: 70 U/L (ref 39–117)
Anion gap: 11 (ref 5–15)
BUN: 7 mg/dL (ref 6–23)
CALCIUM: 9.2 mg/dL (ref 8.4–10.5)
CO2: 25 meq/L (ref 19–32)
CREATININE: 0.7 mg/dL (ref 0.50–1.35)
Chloride: 102 mEq/L (ref 96–112)
GLUCOSE: 106 mg/dL — AB (ref 70–99)
Potassium: 4 mEq/L (ref 3.7–5.3)
Sodium: 138 mEq/L (ref 137–147)
Total Bilirubin: 1.2 mg/dL (ref 0.3–1.2)
Total Protein: 6.7 g/dL (ref 6.0–8.3)

## 2014-01-28 LAB — CBC WITH DIFFERENTIAL/PLATELET
BASOS PCT: 0 % (ref 0–1)
Basophils Absolute: 0 10*3/uL (ref 0.0–0.1)
EOS PCT: 3 % (ref 0–5)
Eosinophils Absolute: 0.5 10*3/uL (ref 0.0–0.7)
HCT: 26.7 % — ABNORMAL LOW (ref 39.0–52.0)
HEMOGLOBIN: 9.5 g/dL — AB (ref 13.0–17.0)
LYMPHS ABS: 3.9 10*3/uL (ref 0.7–4.0)
Lymphocytes Relative: 26 % (ref 12–46)
MCH: 33.8 pg (ref 26.0–34.0)
MCHC: 35.6 g/dL (ref 30.0–36.0)
MCV: 95 fL (ref 78.0–100.0)
MONO ABS: 1.4 10*3/uL — AB (ref 0.1–1.0)
Monocytes Relative: 9 % (ref 3–12)
Neutro Abs: 9.2 10*3/uL — ABNORMAL HIGH (ref 1.7–7.7)
Neutrophils Relative %: 62 % (ref 43–77)
PLATELETS: 243 10*3/uL (ref 150–400)
RBC: 2.81 MIL/uL — AB (ref 4.22–5.81)
RDW: 14.6 % (ref 11.5–15.5)
WBC: 15 10*3/uL — ABNORMAL HIGH (ref 4.0–10.5)

## 2014-01-28 MED ORDER — MORPHINE SULFATE ER 30 MG PO TBCR: 30.0000 mg | EXTENDED_RELEASE_TABLET | Freq: Two times a day (BID) | ORAL | Status: DC

## 2014-01-28 MED ORDER — OXYCODONE HCL 20 MG PO TABS: 20.0000 mg | ORAL_TABLET | ORAL | Status: DC | PRN

## 2014-01-28 NOTE — Progress Notes (Signed)
Patient discharged to home with girlfriend, discharge instructions reviewed with patient who verbalized understanding. New RX's given to patient.

## 2014-01-28 NOTE — Discharge Summary (Signed)
Physician Discharge Summary  Patient ID: Alejandro Soto MRN: [id number] DOB/AGE: 1986-03-29 28 y.o.  Admit date: 01/24/2014 Discharge date: 01/28/2014  Admission Diagnoses:  Discharge Diagnoses:  Active Problems:   Hb-SS disease with crisis   Discharged Condition: good  Hospital Course: Patient was admitted with sickle cell painful crisis. He was having significant pain in his back and legs. He was previously on methadone and MSIR. He was a responder well to the methadone. In the hospital he was started on IV Dilaudid PCA, with physician assistant dosing. Later, patient was transitioned to [place] for long-acting and MSIR for rescue pain. He responded very well to treatment and at the time of discharge was given prescriptions painting when he will be seen by his primary care physician.  Consults: None  Significant Diagnostic Studies: labs: CBC is as CMP was check serially.  Treatments: IV hydration and analgesia: Dilaudid and Morphine  Discharge Exam: Blood pressure 126/67, pulse 71, temperature 98.1 F (36.7 C), temperature source Oral, resp. rate 16, height 5\' 8"  (1.727 m), weight 66.588 kg (146 lb 12.8 oz), SpO2 96.00%. General appearance: alert, cooperative and no distress Eyes: conjunctivae/corneas clear. PERRL, EOM's intact. Fundi benign. Neck: no adenopathy, no carotid bruit, no JVD, supple, symmetrical, trachea midline and thyroid not enlarged, symmetric, no tenderness/mass/nodules Back: symmetric, no curvature. ROM normal. No CVA tenderness. Resp: clear to auscultation bilaterally Chest wall: no tenderness Cardio: regular rate and rhythm, S1, S2 normal, no murmur, click, rub or gallop GI: soft, non-tender; bowel sounds normal; no masses,  no organomegaly Extremities: extremities normal, atraumatic, no cyanosis or edema Pulses: 2+ and symmetric Skin: Skin color, texture, turgor normal. No rashes or lesions Neurologic: Grossly normal  Disposition: 01-Home or Self Care     Medication List    STOP taking these medications       methadone 5 MG tablet  Commonly known as:  DOLOPHINE      TAKE these medications       folic acid 1 MG tablet  Commonly known as:  FOLVITE  Take 1 mg by mouth daily.     gabapentin 300 MG capsule  Commonly known as:  NEURONTIN  Take 1 capsule (300 mg total) by mouth 2 (two) times daily.     hydroxyurea 500 MG capsule  Commonly known as:  HYDREA  Take 1,500 mg by mouth daily. May take with food to minimize GI side effects.     ibuprofen 600 MG tablet  Commonly known as:  ADVIL,MOTRIN  Take 1 tablet (600 mg total) by mouth every 6 (six) hours as needed for mild pain or moderate pain.     morphine 30 MG 12 hr tablet  Commonly known as:  MS CONTIN  Take 1 tablet (30 mg total) by mouth every 12 (twelve) hours.     multivitamin tablet  Take 2 tablets by mouth daily.     Oxycodone HCl 20 MG Tabs  Take 1 tablet (20 mg total) by mouth every 4 (four) hours as needed for severe pain.         SignedBarbette Merino 01/28/2014, 7:25 AM  Time spent 33 minutes

## 2014-01-31 NOTE — Discharge Summary (Signed)
Pt seen and examined and discussed with NP Cammie Sickle. Agree with discharge home.

## 2014-02-07 ENCOUNTER — Telehealth: Payer: Self-pay | Admitting: Internal Medicine

## 2014-02-07 NOTE — Telephone Encounter (Signed)
REFILL REQUEST ON OXYCODONE 20MG . lov 11/27/2013 WITH DR. MATTHEWS. PLEASE ADVISE. THANKS!

## 2014-02-08 MED ORDER — OXYCODONE HCL 20 MG PO TABS: 20.0000 mg | ORAL_TABLET | ORAL | Status: DC | PRN

## 2014-02-08 NOTE — Telephone Encounter (Signed)
Meds ordered this encounter  Medications  . Oxycodone HCl 20 MG TABS    Sig: Take 1 tablet (20 mg total) by mouth every 4 (four) hours as needed.    Dispense:  90 tablet    Refill:  0    Rx not to be filled before 02/12/2014    Order Specific Question:  Supervising Provider    Answer:  Liston Alba A [[id number]]  Reviewed [place] [facility] Reporting system prior to reorder Dorena Dew, FNP

## 2014-02-09 DIAGNOSIS — M533 Sacrococcygeal disorders, not elsewhere classified: Secondary | ICD-10-CM | POA: Diagnosis not present

## 2014-02-09 DIAGNOSIS — G9009 Other idiopathic peripheral autonomic neuropathy: Secondary | ICD-10-CM | POA: Diagnosis not present

## 2014-02-09 DIAGNOSIS — G894 Chronic pain syndrome: Secondary | ICD-10-CM | POA: Diagnosis not present

## 2014-02-09 DIAGNOSIS — Z79899 Other long term (current) drug therapy: Secondary | ICD-10-CM | POA: Diagnosis not present

## 2014-02-09 DIAGNOSIS — M545 Low back pain, unspecified: Secondary | ICD-10-CM | POA: Diagnosis not present

## 2014-02-16 ENCOUNTER — Telehealth (HOSPITAL_COMMUNITY): Payer: Self-pay | Admitting: *Deleted

## 2014-02-16 NOTE — Telephone Encounter (Signed)
Notified patient of [facility] on Sept. 18,2015. Patient acknowledges.

## 2014-02-22 ENCOUNTER — Telehealth: Payer: Self-pay | Admitting: Internal Medicine

## 2014-02-22 DIAGNOSIS — D571 Sickle-cell disease without crisis: Secondary | ICD-10-CM

## 2014-02-22 MED ORDER — HYDROXYUREA 500 MG PO CAPS: 1500.0000 mg | ORAL_CAPSULE | Freq: Every day | ORAL | Status: DC

## 2014-02-22 MED ORDER — OXYCODONE HCL 20 MG PO TABS: 20.0000 mg | ORAL_TABLET | ORAL | Status: DC | PRN

## 2014-02-22 MED ORDER — MORPHINE SULFATE ER 30 MG PO TBCR: 30.0000 mg | EXTENDED_RELEASE_TABLET | Freq: Two times a day (BID) | ORAL | Status: DC

## 2014-02-22 NOTE — Telephone Encounter (Signed)
Refill request for oxycodone 20mg , and MS Contin 30mg . LOV 11/27/2013. Please advise. Thanks!

## 2014-02-22 NOTE — Telephone Encounter (Signed)
Prescription reordered for oxycodone 15 mg #90 tabs and Methadone 5 mg # 30 tabs.

## 2014-02-23 DIAGNOSIS — D57819 Other sickle-cell disorders with crisis, unspecified: Secondary | ICD-10-CM | POA: Diagnosis not present

## 2014-02-23 DIAGNOSIS — D5700 Hb-SS disease with crisis, unspecified: Secondary | ICD-10-CM | POA: Diagnosis not present

## 2014-02-23 DIAGNOSIS — Z79899 Other long term (current) drug therapy: Secondary | ICD-10-CM | POA: Diagnosis not present

## 2014-03-01 ENCOUNTER — Encounter: Payer: Self-pay | Admitting: Family Medicine

## 2014-03-01 ENCOUNTER — Non-Acute Institutional Stay (HOSPITAL_COMMUNITY)
Admission: AD | Admit: 2014-03-01 | Discharge: 2014-03-01 | Disposition: A | Discharge status: Home / Self Care | Payer: Medicare Other | Source: Ambulatory Visit | Attending: Internal Medicine | Admitting: Internal Medicine

## 2014-03-01 ENCOUNTER — Ambulatory Visit (INDEPENDENT_AMBULATORY_CARE_PROVIDER_SITE_OTHER): Payer: Medicare Other | Admitting: Family Medicine

## 2014-03-01 VITALS — BP 119/65 | HR 89 | Temp 97.9°F | Resp 20 | Wt 146.5 lb

## 2014-03-01 DIAGNOSIS — D5700 Hb-SS disease with crisis, unspecified: Secondary | ICD-10-CM | POA: Diagnosis present

## 2014-03-01 DIAGNOSIS — G894 Chronic pain syndrome: Secondary | ICD-10-CM

## 2014-03-01 DIAGNOSIS — Z79899 Other long term (current) drug therapy: Secondary | ICD-10-CM | POA: Diagnosis not present

## 2014-03-01 DIAGNOSIS — Z880 Allergy status to penicillin: Secondary | ICD-10-CM | POA: Diagnosis not present

## 2014-03-01 DIAGNOSIS — D571 Sickle-cell disease without crisis: Secondary | ICD-10-CM

## 2014-03-01 DIAGNOSIS — M542 Cervicalgia: Secondary | ICD-10-CM | POA: Insufficient documentation

## 2014-03-01 DIAGNOSIS — Z23 Encounter for immunization: Secondary | ICD-10-CM

## 2014-03-01 DIAGNOSIS — F17200 Nicotine dependence, unspecified, uncomplicated: Secondary | ICD-10-CM | POA: Insufficient documentation

## 2014-03-01 LAB — CBC WITH DIFFERENTIAL/PLATELET
BASOS PCT: 0 % (ref 0–1)
Basophils Absolute: 0 10*3/uL (ref 0.0–0.1)
Eosinophils Absolute: 0.4 10*3/uL (ref 0.0–0.7)
Eosinophils Relative: 2 % (ref 0–5)
HCT: 32.5 % — ABNORMAL LOW (ref 39.0–52.0)
Hemoglobin: 11.8 g/dL — ABNORMAL LOW (ref 13.0–17.0)
Lymphocytes Relative: 22 % (ref 12–46)
Lymphs Abs: 4.1 10*3/uL — ABNORMAL HIGH (ref 0.7–4.0)
MCH: 33.7 pg (ref 26.0–34.0)
MCHC: 36.3 g/dL — ABNORMAL HIGH (ref 30.0–36.0)
MCV: 92.9 fL (ref 78.0–100.0)
Monocytes Absolute: 0.8 10*3/uL (ref 0.1–1.0)
Monocytes Relative: 4 % (ref 3–12)
NEUTROS ABS: 13.5 10*3/uL — AB (ref 1.7–7.7)
NEUTROS PCT: 72 % (ref 43–77)
PLATELETS: 266 10*3/uL (ref 150–400)
RBC: 3.5 MIL/uL — ABNORMAL LOW (ref 4.22–5.81)
RDW: 15.1 % (ref 11.5–15.5)
WBC: 18.9 10*3/uL — AB (ref 4.0–10.5)

## 2014-03-01 LAB — COMPREHENSIVE METABOLIC PANEL
ALBUMIN: 4.4 g/dL (ref 3.5–5.2)
ALT: 12 U/L (ref 0–53)
AST: 22 U/L (ref 0–37)
Alkaline Phosphatase: 54 U/L (ref 39–117)
Anion gap: 11 (ref 5–15)
BILIRUBIN TOTAL: 1 mg/dL (ref 0.3–1.2)
BUN: 7 mg/dL (ref 6–23)
CHLORIDE: 102 meq/L (ref 96–112)
CO2: 28 mEq/L (ref 19–32)
CREATININE: 0.75 mg/dL (ref 0.50–1.35)
Calcium: 9.3 mg/dL (ref 8.4–10.5)
GFR calc Af Amer: >90 mL/min (ref >90)
GFR calc non Af Amer: >90 mL/min (ref >90)
Glucose, Bld: 66 mg/dL — ABNORMAL LOW (ref 70–99)
POTASSIUM: 3.5 meq/L — AB (ref 3.7–5.3)
SODIUM: 141 meq/L (ref 137–147)
Total Protein: 7.8 g/dL (ref 6.0–8.3)

## 2014-03-01 LAB — RETICULOCYTES
RBC.: 3.5 MIL/uL — ABNORMAL LOW (ref 4.22–5.81)
RETIC CT PCT: 8.2 % — AB (ref 0.4–3.1)
Retic Count, Absolute: 287 10*3/uL — ABNORMAL HIGH (ref 19.0–186.0)

## 2014-03-01 MED ORDER — HYDROMORPHONE HCL PF 2 MG/ML IJ SOLN
1.6000 mg | Freq: Once | INTRAMUSCULAR | Status: AC
  Administered 2014-03-01: 1.6 mg via INTRAVENOUS
  Filled 2014-03-01: qty 1

## 2014-03-01 MED ORDER — DIPHENHYDRAMINE HCL 25 MG PO CAPS: 25.0000 mg | ORAL_CAPSULE | Freq: Four times a day (QID) | ORAL | Status: DC | PRN

## 2014-03-01 MED ORDER — HYDROMORPHONE HCL PF 2 MG/ML IJ SOLN
3.0000 mg | Freq: Once | INTRAMUSCULAR | Status: AC
Start: 2014-03-01 — End: 2014-03-01
  Administered 2014-03-01: 3 mg via INTRAVENOUS
  Filled 2014-03-01: qty 2

## 2014-03-01 MED ORDER — DEXTROSE-NACL 5-0.45 % IV SOLN
INTRAVENOUS | Status: DC
  Administered 2014-03-01: 14:00:00 via INTRAVENOUS

## 2014-03-01 MED ORDER — KETOROLAC TROMETHAMINE 30 MG/ML IJ SOLN
30.0000 mg | Freq: Once | INTRAMUSCULAR | Status: AC
  Administered 2014-03-01: 30 mg via INTRAVENOUS
  Filled 2014-03-01: qty 1

## 2014-03-01 MED ORDER — SODIUM CHLORIDE 0.9 % IJ SOLN: 9.0000 mL | INTRAMUSCULAR | Status: DC | PRN

## 2014-03-01 MED ORDER — ONDANSETRON HCL 4 MG/2ML IJ SOLN: 4.0000 mg | Freq: Four times a day (QID) | INTRAMUSCULAR | Status: DC | PRN

## 2014-03-01 MED ORDER — OXYCODONE HCL 5 MG PO TABS
20.0000 mg | ORAL_TABLET | Freq: Once | ORAL | Status: AC
Start: 2014-03-01 — End: 2014-03-01
  Administered 2014-03-01: 20 mg via ORAL
  Filled 2014-03-01: qty 4

## 2014-03-01 MED ORDER — HYDROMORPHONE 2 MG/ML HIGH CONCENTRATION IV PCA SOLN
INTRAVENOUS | Status: DC
  Administered 2014-03-01: 16:00:00 via INTRAVENOUS
  Administered 2014-03-01: 4.5 mg via INTRAVENOUS
  Filled 2014-03-01: qty 25

## 2014-03-01 MED ORDER — NALOXONE HCL 0.4 MG/ML IJ SOLN: 0.4000 mg | INTRAMUSCULAR | Status: DC | PRN

## 2014-03-01 NOTE — Progress Notes (Signed)
Patient reports pain 5/10 and ready for discharge. Patient discharged to home. VSS. Patient in no apparent distress. Discharge instructions reviewed with patient. Patient states he does not want to go to the pain clinic for treatment. Patient states he will notify his primary care doctor of this decision to not go to pain clinic. Patient agrees to call primary office for follow-up appointment. Patient leaves day hospital before discharge instruction paperwork could be given to him.

## 2014-03-01 NOTE — Discharge Summary (Signed)
Physician Discharge Summary  Alejandro Soto GUR:[id number] DOB: 1986/04/27 DOA: 03/01/2014  PCP: MATTHEWS,MICHELLE A., MD  Admit date: 03/01/2014 Discharge date: 03/01/2014  Discharge Diagnoses:  Active Problems:   Hb-SS disease with crisis   Discharge Condition: Stable  Disposition: Home  Diet:Regular Wt Readings from Last 3 Encounters:  03/01/14 146 lb 8 oz (66.452 kg)  01/28/14 146 lb 12.8 oz (66.588 kg)  01/23/14 145 lb (65.772 kg)     Hospital Course:  Patient was admitted to the day infusion center for extended observation. All laboratory values were reviewed and found to be consistent with baseline values.   He was started on hypotonic IVFs for cellular rehydration. Mr. Auker was also given Toradol 30 mg times 1. Pain was managed with IV dilaudid. Mr. Laduca pain intensity remained greater than 7/10, he was transitioned to weight based PCA dilaudid. He used a total of 4.5 mg with 14 demands and 9 deliveries.   He was given Oxycodone 20 mg 30 minutes prior to discharge. Patient states that current pain intensity is 4/10 and he is alert, oriented, and ambulatory. Mr. Beitler is functional and states that he can manage at home on current medication regimen. Patient is to follow up in clinic as scheduled.   Discharge Exam:  Filed Vitals:   03/01/14 1551  BP: 101/66  Pulse: 67  Temp: 98.2 F (36.8 C)  Resp: 14   Filed Vitals:   03/01/14 1543 03/01/14 1551  BP:  101/66  Pulse: 68 67  Temp:  98.2 F (36.8 C)  TempSrc:  Oral  Resp: 14 14  SpO2: 98% 98%     General: Alert, awake, oriented x3, in no acute distress.  HEENT: [place]/AT PEERL, EOMI Heart: Regular rate and rhythm, without murmurs, rubs, gallops, PMI non-displaced, no heaves or thrills on palpation.  Lungs: Clear to auscultation, no wheezing or rhonchi noted. No increased vocal fremitus resonant to percussion  Abdomen: Soft, nontender, nondistended, positive bowel sounds, no masses no hepatosplenomegaly noted..   Neuro: No focal neurological deficits noted cranial nerves II through XII grossly intact. DTRs 2+ bilaterally upper and lower extremities. Strength 5 out of 5 in bilateral upper and lower extremities. Musculoskeletal: No warm swelling or erythema around joints, no spinal tenderness noted. Psychiatric: Patient alert and oriented x3, good insight and cognition, good recent to remote recall. Lymph node survey: No cervical axillary or inguinal lymphadenopathy noted.   Discharge Instructions     Medication List    ASK your doctor about these medications       folic acid 1 MG tablet  Commonly known as:  FOLVITE  Take 1 mg by mouth daily.     gabapentin 300 MG capsule  Commonly known as:  NEURONTIN  Take 1 capsule (300 mg total) by mouth 2 (two) times daily.     hydroxyurea 500 MG capsule  Commonly known as:  HYDREA  Take 3 capsules (1,500 mg total) by mouth daily. May take with food to minimize GI side effects.     ibuprofen 600 MG tablet  Commonly known as:  ADVIL,MOTRIN  Take 1 tablet (600 mg total) by mouth every 6 (six) hours as needed for mild pain or moderate pain.     morphine 30 MG 12 hr tablet  Commonly known as:  MS CONTIN  Take 1 tablet (30 mg total) by mouth every 12 (twelve) hours.     multivitamin tablet  Take 2 tablets by mouth daily.     Oxycodone HCl 20 MG  Tabs  Take 1 tablet (20 mg total) by mouth every 4 (four) hours as needed.          The results of significant diagnostics from this hospitalization (including imaging, microbiology, ancillary and laboratory) are listed below for reference.    Significant Diagnostic Studies: No results found.  Microbiology: No results found for this or any previous visit (from the past 240 hour(s)).   Labs: Basic Metabolic Panel:  Recent Labs Lab 03/01/14 1327  NA 141  K 3.5*  CL 102  CO2 28  GLUCOSE 66*  BUN 7  CREATININE 0.75  CALCIUM 9.3   Liver Function Tests:  Recent Labs Lab 03/01/14 1327   AST 22  ALT 12  ALKPHOS 54  BILITOT 1.0  PROT 7.8  ALBUMIN 4.4   No results found for this basename: LIPASE, AMYLASE,  in the last 168 hours No results found for this basename: AMMONIA,  in the last 168 hours CBC:  Recent Labs Lab 03/01/14 1327  WBC 18.9*  NEUTROABS 13.5*  HGB 11.8*  HCT 32.5*  MCV 92.9  PLT 266   Cardiac Enzymes: No results found for this basename: CKTOTAL, CKMB, CKMBINDEX, TROPONINI,  in the last 168 hours BNP: No components found with this basename: POCBNP,  CBG: No results found for this basename: GLUCAP,  in the last 168 hours Ferritin: No results found for this basename: FERRITIN,  in the last 168 hours  Time coordinating discharge: Greater than 30 minutes  Signed:  Psalms Olarte M  03/01/2014, 5:12 PM

## 2014-03-01 NOTE — Discharge Summary (Signed)
I agree with the above history, physical, assessment and plan. Pt's pain improved after treatment in day hospital. Pt will be discharged home.

## 2014-03-01 NOTE — H&P (Signed)
Alejandro Soto History and Physical  Alejandro Soto OIZ:[id number] DOB: 1985-11-24 DOA: 03/01/2014   PCP: MATTHEWS,MICHELLE A., MD   Chief Complaint: Neck and back pain  HPI: Patient with a history of sickle cell anemia, HbSS presents with pain to neck and back. [name] states that pain to neck and back started on last weekend. Patient has not identified any palliative or provocative factors that attribute to crisis.  Pain intensity is currently 7/10 described and constant and throbbing. He maintains that he last had Oxycodone 20 mg around 9 am with minimal relief. Patient denies headache, chest pain, shortness of breath, nausea, vomiting, or diarrhea. Will admit to day infusion center for extended observation.    Review of Systems:  Constitutional: No weight loss, night sweats, Fevers, chills, fatigue.  HEENT: No headaches, dizziness, seizures, vision changes, difficulty swallowing,Tooth/dental problems,Sore throat, No sneezing, itching, ear ache, nasal congestion, post nasal drip,  Cardio-vascular: No chest pain, Orthopnea, PND, swelling in lower extremities, anasarca, dizziness, palpitations  GI: No heartburn, indigestion, abdominal pain, nausea, vomiting, diarrhea, change in bowel habits, loss of appetite  Resp: No shortness of breath with exertion or at rest. No excess mucus, no productive cough, No non-productive cough, No coughing up of blood.No change in color of mucus.No wheezing.No chest wall deformity  Skin: no rash or lesions.  GU: no dysuria, change in color of urine, no urgency or frequency. No flank pain.  Musculoskeletal: Pain to left and right neck . Decreased range of motion. No back pain.  Psych: No change in mood or affect. No depression or anxiety. No memory loss.    Past Medical History  Diagnosis Date  . Sickle cell anemia   . Pneumonia    Past Surgical History  Procedure Laterality Date  . No past surgeries     Social History:  reports that he has  been smoking Cigarettes.  He has been smoking about 0.00 packs per day for the past 5 years. He has never used smokeless tobacco. He reports that he does not drink alcohol or use illicit drugs.  Allergies  Allergen Reactions  . Amoxicillin Diarrhea    Family History  Problem Relation Age of Onset  . Diabetes Father   . Cancer Father 60    Prostate  . Asthma Brother   . Cancer Maternal Grandmother     colon     Prior to Admission medications   Medication Sig Start Date End Date Taking? Authorizing Provider  folic acid (FOLVITE) 1 MG tablet Take 1 mg by mouth daily.    Historical Provider, MD  gabapentin (NEURONTIN) 300 MG capsule Take 1 capsule (300 mg total) by mouth 2 (two) times daily. 09/07/13   Leana Gamer, MD  hydroxyurea (HYDREA) 500 MG capsule Take 3 capsules (1,500 mg total) by mouth daily. May take with food to minimize GI side effects. 02/22/14   Leana Gamer, MD  ibuprofen (ADVIL,MOTRIN) 600 MG tablet Take 1 tablet (600 mg total) by mouth every 6 (six) hours as needed for mild pain or moderate pain. 12/15/13   Leana Gamer, MD  morphine (MS CONTIN) 30 MG 12 hr tablet Take 1 tablet (30 mg total) by mouth every 12 (twelve) hours. 02/22/14   Leana Gamer, MD  Multiple Vitamin (MULTIVITAMIN) tablet Take 2 tablets by mouth daily.     Historical Provider, MD  Oxycodone HCl 20 MG TABS Take 1 tablet (20 mg total) by mouth every 4 (four) hours as needed. 02/22/14  Leana Gamer, MD   Physical Exam: There were no vitals filed for this visit. General: Alert, awake, oriented x3, in mild acute distress.  HEENT: /AT PEERL, EOMI Neck: Trachea midline,  no masses, no thyromegal,y no JVD, no carotid bruit. Neck: Neck supple. Muscular tenderness present. No rigidity. Decreased range of motion present. No edema and no erythema present.  OROPHARYNX:  Moist, No exudate/ erythema/lesions.  Heart: Regular rate and rhythm, without murmurs, rubs, gallops, PMI  non-displaced, no heaves or thrills on palpation.  Lungs: Clear to auscultation, no wheezing or rhonchi noted. No increased vocal fremitus resonant to percussion  Abdomen: Soft, nontender, nondistended, positive bowel sounds, no masses no hepatosplenomegaly noted..  Neuro: No focal neurological deficits noted cranial nerves II through XII grossly intact. Musculoskeletal: Thoracic back: He exhibits decreased range of motion and tenderness   Labs on Admission:   Basic Metabolic Panel: No results found for this basename: NA, K, CL, CO2, GLUCOSE, BUN, CREATININE, CALCIUM, MG, PHOS,  in the last 168 hours Liver Function Tests: No results found for this basename: AST, ALT, ALKPHOS, BILITOT, PROT, ALBUMIN,  in the last 168 hours CBC: No results found for this basename: WBC, NEUTROABS, HGB, HCT, MCV, PLT,  in the last 168 hours BNP: No components found with this basename: POCBNP,    Radiological Exams on Admission: No results found.  EKG: Independently reviewed. None  Assessment/Plan: Active Problems:   * No active hospital problems. *   Time spend: 13 Code Status: Full Family Communication: None Disposition Plan: Home when   [facility], FNP Pager [phone]  If 7PM-7AM, please contact night-coverage [email or web address] Password [email or web address] 03/01/2014, 1:25 PM

## 2014-03-01 NOTE — H&P (Signed)
I agree with the above history, physical, assessment and plan. Pt to be admitted for further treatment of sickle cell crisis. Please see admission H&P.

## 2014-03-01 NOTE — Discharge Instructions (Signed)
Sickle Cell Anemia, Adult °Sickle cell anemia is a condition in which red blood cells have an abnormal "sickle" shape. This abnormal shape shortens the cells' life span, which results in a lower than normal concentration of red blood cells in the blood. The sickle shape also causes the cells to clump together and block free blood flow through the blood vessels. As a result, the tissues and organs of the body do not receive enough oxygen. Sickle cell anemia causes organ damage and pain and increases the risk of infection. °CAUSES  °Sickle cell anemia is a genetic disorder. Those who receive two copies of the gene have the condition, and those who receive one copy have the trait. °RISK FACTORS °The sickle cell gene is most common in people whose families originated in [place]. Other areas of the globe where sickle cell trait occurs include the Mediterranean, [place] and [place], the [place], and the [place].  °SIGNS AND SYMPTOMS °· Pain, especially in the extremities, back, chest, or abdomen (common). The pain may start suddenly or may develop following an illness, especially if there is dehydration. Pain can also occur due to overexertion or exposure to extreme temperature changes. °· Frequent severe bacterial infections, especially certain types of pneumonia and meningitis. °· Pain and swelling in the hands and feet. °· Decreased activity.   °· Loss of appetite.   °· Change in behavior. °· Headaches. °· Seizures. °· Shortness of breath or difficulty breathing. °· Vision changes. °· Skin ulcers. °Those with the trait may not have symptoms or they may have mild symptoms.  °DIAGNOSIS  °Sickle cell anemia is diagnosed with blood tests that demonstrate the genetic trait. It is often diagnosed during the newborn period, due to mandatory testing nationwide. A variety of blood tests, X-rays, CT scans, MRI scans, ultrasounds, and lung function tests may also be done to monitor the condition. °TREATMENT  °Sickle  cell anemia may be treated with: °· Medicines. You may be given pain medicines, antibiotic medicines (to treat and prevent infections) or medicines to increase the production of certain types of hemoglobin. °· Fluids. °· Oxygen. °· Blood transfusions. °HOME CARE INSTRUCTIONS  °· Drink enough fluid to keep your urine clear or pale yellow. Increase your fluid intake in hot weather and during exercise. °· Do not smoke. Smoking lowers oxygen levels in the blood.   °· Only take over-the-counter or prescription medicines for pain, fever, or discomfort as directed by your health care provider. °· Take antibiotics as directed by your health care provider. Make sure you finish them it even if you start to feel better.   °· Take supplements as directed by your health care provider.   °· Consider wearing a medical alert bracelet. This tells anyone caring for you in an emergency of your condition.   °· When traveling, keep your medical information, health care provider's names, and the medicines you take with you at all times.   °· If you develop a fever, do not take medicines to reduce the fever right away. This could cover up a problem that is developing. Notify your health care provider. °· Keep all follow-up appointments with your health care provider. Sickle cell anemia requires regular medical care. °SEEK MEDICAL CARE IF: ° You have a fever. °SEEK IMMEDIATE MEDICAL CARE IF:  °· You feel dizzy or faint.   °· You have new abdominal pain, especially on the left side near the stomach area.   °· You develop a persistent, often uncomfortable and painful penile erection (priapism). If this is not treated immediately it   will lead to impotence.   °· You have numbness your arms or legs or you have a hard time moving them.   °· You have a hard time with speech.   °· You have a fever or persistent symptoms for more than 2-3 days.   °· You have a fever and your symptoms suddenly get worse.   °· You have signs or symptoms of infection.  These include:   °¨ Chills.   °¨ Abnormal tiredness (lethargy).   °¨ Irritability.   °¨ Poor eating.   °¨ Vomiting.   °· You develop pain that is not helped with medicine.   °· You develop shortness of breath. °· You have pain in your chest.   °· You are coughing up pus-like or bloody sputum.   °· You develop a stiff neck. °· Your feet or hands swell or have pain. °· Your abdomen appears bloated. °· You develop joint pain. °MAKE SURE YOU: °· Understand these instructions. °Document Released: 09/16/2005 Document Revised: 10/23/2013 Document Reviewed: 01/18/2013 °Exit[employer]® Patient Information ©2015 [employer]. This information is not intended to replace advice given to you by your health care provider. Make sure you discuss any questions you have with your health care provider. ° °

## 2014-03-01 NOTE — Progress Notes (Signed)
   Subjective:    Patient ID: Alejandro Soto, male    DOB: Apr 14, 1986, 28 y.o.   MRN: [id number]  HPI  Alejandro Soto is in for a 3 month follow up of sickle cell disease. He reports that he has had increased pain since last weekend. He maintains that he has been taking medications consistently. He does not identify any palliative or provacative factors that attribute to currrent crisis. He states that he last had Oxycodone 20 mg around 9 am, with minimal relief. Pain is primarily to left and right neck and upper back described as constant and throbbing.    Review of Systems  Constitutional: Negative.   HENT: Negative.   Eyes: Negative.   Respiratory: Negative.   Cardiovascular: Negative.   Gastrointestinal: Negative.   Endocrine: Negative.   Genitourinary: Negative.   Musculoskeletal: Positive for neck pain.  Skin: Negative.   Allergic/Immunologic: Negative.   Neurological: Negative.   Hematological: Negative.   Psychiatric/Behavioral: Negative.        Objective:   Physical Exam  Constitutional: He is oriented to person, place, and time. He appears well-developed and well-nourished.  HENT:  Head: Normocephalic.  Right Ear: External ear normal.  Left Ear: External ear normal.  Eyes: Conjunctivae are normal. Pupils are equal, round, and reactive to light.  Neck: Neck supple. Muscular tenderness present. No rigidity. Decreased range of motion present. No edema and no erythema present.  Cardiovascular: Normal rate and normal heart sounds.   Pulmonary/Chest: Effort normal and breath sounds normal.  Abdominal: Soft. Bowel sounds are normal.  Musculoskeletal:       Thoracic back: He exhibits decreased range of motion and tenderness.  Neurological: He is alert and oriented to person, place, and time. He has normal reflexes.  Skin: Skin is warm and dry. No rash noted. No pallor.  Psychiatric: He has a normal mood and affect. His behavior is normal. Judgment and thought content normal.          BP 119/65  Pulse 89  Temp(Src) 97.9 F (36.6 C) (Oral)  Resp 20  Wt 146 lb 8 oz (66.452 kg)  SpO2 100%.  Assessment & Plan:   1. Need for prophylactic vaccination and inoculation against influenza  - Flu Vaccine QUAD 36+ mos PF IM (Fluarix Quad PF)  2. Marland KitchenHb-SS disease with crisis Will transition to day infusion center for extended observation. Pain intensity is 7/10, will send for IV infusion and pain management. Will follow up in clinic in 3 months as scheduled for sickle cell anemia.   3. Chronic pain syndrome Patient is on chronic opioid medications. He states that he has re-scheduled pain clinic appointment several times. Recommend that patient follow up.   5. Neck pain, bilateral Pain is primarily in neck and upper back . Patient has decreased range of motion to neck .        Dorena Dew, FNP

## 2014-03-07 ENCOUNTER — Telehealth: Payer: Self-pay | Admitting: Internal Medicine

## 2014-03-07 DIAGNOSIS — D571 Sickle-cell disease without crisis: Secondary | ICD-10-CM

## 2014-03-07 NOTE — Telephone Encounter (Signed)
Refill request for oxycodone 20mg . LOV 03/01/2014. Please advise. Thanks!

## 2014-03-13 MED ORDER — OXYCODONE HCL 20 MG PO TABS: 20.0000 mg | ORAL_TABLET | ORAL | Status: DC | PRN

## 2014-03-13 NOTE — Telephone Encounter (Signed)
Meds ordered this encounter  Medications  . Oxycodone HCl 20 MG TABS    Sig: Take 1 tablet (20 mg total) by mouth every 4 (four) hours as needed.    Dispense:  90 tablet    Refill:  0    Order Specific Question:  Supervising Provider    Answer:  Liston Alba A [[id number]]  Coreena Rubalcava M, FNP Reviewed [facility]stance Reporting system prior to reorder

## 2014-03-13 NOTE — Telephone Encounter (Signed)
Patient called to pick up this refill.  I could not locate it in the prescription drawer.  Alejandro Soto, please refill if possible.  Thanks

## 2014-03-21 ENCOUNTER — Telehealth: Payer: Self-pay | Admitting: Internal Medicine

## 2014-03-21 DIAGNOSIS — F1190 Opioid use, unspecified, uncomplicated: Secondary | ICD-10-CM

## 2014-03-21 DIAGNOSIS — D571 Sickle-cell disease without crisis: Secondary | ICD-10-CM

## 2014-03-21 DIAGNOSIS — G894 Chronic pain syndrome: Secondary | ICD-10-CM

## 2014-03-22 MED ORDER — OXYCODONE HCL 20 MG PO TABS: 20.0000 mg | ORAL_TABLET | ORAL | Status: DC | PRN

## 2014-03-22 NOTE — Telephone Encounter (Signed)
Refill request for oxycodone 20mg . LOV 03/01/2014. Please advise. Thanks!

## 2014-03-22 NOTE — Telephone Encounter (Signed)
Meds ordered this encounter  Medications  . Oxycodone HCl 20 MG TABS    Sig: Take 1 tablet (20 mg total) by mouth every 4 (four) hours as needed.    Dispense:  90 tablet    Refill:  0    Rx not to be filled prior to 03/23/2014    Order Specific Question:  Supervising Provider    Answer:  Liston Alba A [[id number]]  Reviewed [place] Substance Reporting system prior to reorder Dorena Dew, FNP

## 2014-03-27 ENCOUNTER — Other Ambulatory Visit: Payer: Medicare Other

## 2014-03-27 DIAGNOSIS — G894 Chronic pain syndrome: Secondary | ICD-10-CM | POA: Diagnosis not present

## 2014-03-30 DIAGNOSIS — M545 Low back pain: Secondary | ICD-10-CM | POA: Diagnosis not present

## 2014-03-30 DIAGNOSIS — G8929 Other chronic pain: Secondary | ICD-10-CM | POA: Diagnosis not present

## 2014-03-31 LAB — OPIATES/OPIOIDS (LC/MS-MS)
Codeine Urine: 691 ng/mL — AB (ref <50)
HYDROCODONE: NEGATIVE ng/mL (ref <50)
HYDROMORPHONE: 310 ng/mL — AB (ref <50)
Morphine Urine: 33948 ng/mL — AB (ref <50)
Norhydrocodone, Ur: NEGATIVE ng/mL (ref <50)
Noroxycodone, Ur: 1279 ng/mL — AB (ref <50)
Oxycodone, ur: 351 ng/mL — AB (ref <50)
Oxymorphone: 1682 ng/mL — AB (ref <50)

## 2014-03-31 LAB — CANNABANOIDS (GC/LC/MS), URINE: THC-COOH UR CONFIRM: 101 ng/mL — AB (ref <5)

## 2014-03-31 LAB — OXYCODONE, URINE (LC/MS-MS)
Noroxycodone, Ur: 1279 ng/mL — AB (ref <50)
OXYCODONE, UR: 351 ng/mL — AB (ref <50)
OXYMORPHONE, URINE: 1682 ng/mL — AB (ref <50)

## 2014-04-01 ENCOUNTER — Encounter (HOSPITAL_COMMUNITY): Payer: Self-pay | Admitting: Emergency Medicine

## 2014-04-01 ENCOUNTER — Inpatient Hospital Stay (HOSPITAL_COMMUNITY)
Admission: EM | Admit: 2014-04-01 | Discharge: 2014-04-04 | DRG: 812 | Disposition: A | Discharge status: Home / Self Care | Payer: Medicare Other | Attending: Internal Medicine | Admitting: Internal Medicine

## 2014-04-01 DIAGNOSIS — D5700 Hb-SS disease with crisis, unspecified: Secondary | ICD-10-CM | POA: Diagnosis not present

## 2014-04-01 DIAGNOSIS — M549 Dorsalgia, unspecified: Secondary | ICD-10-CM

## 2014-04-01 DIAGNOSIS — G894 Chronic pain syndrome: Secondary | ICD-10-CM | POA: Diagnosis not present

## 2014-04-01 DIAGNOSIS — Z881 Allergy status to other antibiotic agents status: Secondary | ICD-10-CM

## 2014-04-01 DIAGNOSIS — G8929 Other chronic pain: Secondary | ICD-10-CM | POA: Diagnosis present

## 2014-04-01 DIAGNOSIS — R109 Unspecified abdominal pain: Secondary | ICD-10-CM

## 2014-04-01 DIAGNOSIS — F17210 Nicotine dependence, cigarettes, uncomplicated: Secondary | ICD-10-CM | POA: Diagnosis present

## 2014-04-01 DIAGNOSIS — F1123 Opioid dependence with withdrawal: Secondary | ICD-10-CM | POA: Diagnosis present

## 2014-04-01 DIAGNOSIS — D571 Sickle-cell disease without crisis: Secondary | ICD-10-CM

## 2014-04-01 LAB — CBC WITH DIFFERENTIAL/PLATELET
BASOS PCT: 0 % (ref 0–1)
Basophils Absolute: 0 10*3/uL (ref 0.0–0.1)
Eosinophils Absolute: 0 10*3/uL (ref 0.0–0.7)
Eosinophils Relative: 0 % (ref 0–5)
HCT: 30.2 % — ABNORMAL LOW (ref 39.0–52.0)
HEMOGLOBIN: 11 g/dL — AB (ref 13.0–17.0)
Lymphocytes Relative: 19 % (ref 12–46)
Lymphs Abs: 3 10*3/uL (ref 0.7–4.0)
MCH: 32.6 pg (ref 26.0–34.0)
MCHC: 36.4 g/dL — ABNORMAL HIGH (ref 30.0–36.0)
MCV: 89.6 fL (ref 78.0–100.0)
MONO ABS: 1.3 10*3/uL — AB (ref 0.1–1.0)
Monocytes Relative: 8 % (ref 3–12)
Neutro Abs: 11.7 10*3/uL — ABNORMAL HIGH (ref 1.7–7.7)
Neutrophils Relative %: 73 % (ref 43–77)
Platelets: 487 10*3/uL — ABNORMAL HIGH (ref 150–400)
RBC: 3.37 MIL/uL — ABNORMAL LOW (ref 4.22–5.81)
RDW: 15 % (ref 11.5–15.5)
WBC: 16 10*3/uL — ABNORMAL HIGH (ref 4.0–10.5)

## 2014-04-01 LAB — COMPREHENSIVE METABOLIC PANEL
ALT: 20 U/L (ref 0–53)
ANION GAP: 14 (ref 5–15)
AST: 34 U/L (ref 0–37)
Albumin: 4.7 g/dL (ref 3.5–5.2)
Alkaline Phosphatase: 54 U/L (ref 39–117)
BILIRUBIN TOTAL: 1.3 mg/dL — AB (ref 0.3–1.2)
BUN: 13 mg/dL (ref 6–23)
CHLORIDE: 103 meq/L (ref 96–112)
CO2: 23 meq/L (ref 19–32)
CREATININE: 0.68 mg/dL (ref 0.50–1.35)
Calcium: 9.6 mg/dL (ref 8.4–10.5)
GFR calc Af Amer: >90 mL/min (ref >90)
Glucose, Bld: 91 mg/dL (ref 70–99)
Potassium: 4.2 mEq/L (ref 3.7–5.3)
Sodium: 140 mEq/L (ref 137–147)
Total Protein: 8.5 g/dL — ABNORMAL HIGH (ref 6.0–8.3)

## 2014-04-01 LAB — RETICULOCYTES
RBC.: 3.37 MIL/uL — ABNORMAL LOW (ref 4.22–5.81)
Retic Count, Absolute: 232.5 10*3/uL — ABNORMAL HIGH (ref 19.0–186.0)
Retic Ct Pct: 6.9 % — ABNORMAL HIGH (ref 0.4–3.1)

## 2014-04-01 MED ORDER — SODIUM CHLORIDE 0.9 % IV BOLUS (SEPSIS)
1000.0000 mL | Freq: Once | INTRAVENOUS | Status: AC
  Administered 2014-04-01: 1000 mL via INTRAVENOUS

## 2014-04-01 MED ORDER — ENOXAPARIN SODIUM 40 MG/0.4ML ~~LOC~~ SOLN
40.0000 mg | Freq: Every day | SUBCUTANEOUS | Status: DC
  Administered 2014-04-01 – 2014-04-03 (×3): 40 mg via SUBCUTANEOUS
  Filled 2014-04-01 (×4): qty 0.4

## 2014-04-01 MED ORDER — SODIUM CHLORIDE 0.9 % IV SOLN
INTRAVENOUS | Status: DC
  Administered 2014-04-02: 75 mL/h via INTRAVENOUS
  Administered 2014-04-02: 12:00:00 via INTRAVENOUS

## 2014-04-01 MED ORDER — HYDROMORPHONE 0.3 MG/ML IV SOLN
INTRAVENOUS | Status: DC
  Administered 2014-04-01: 0.3 mg via INTRAVENOUS
  Administered 2014-04-02: 06:00:00 via INTRAVENOUS
  Filled 2014-04-01 (×2): qty 25

## 2014-04-01 MED ORDER — DIPHENHYDRAMINE HCL 50 MG/ML IJ SOLN: 12.5000 mg | INTRAMUSCULAR | Status: DC | PRN

## 2014-04-01 MED ORDER — DIPHENHYDRAMINE HCL 12.5 MG/5ML PO ELIX: 12.5000 mg | ORAL_SOLUTION | Freq: Four times a day (QID) | ORAL | Status: DC | PRN

## 2014-04-01 MED ORDER — NALOXONE HCL 0.4 MG/ML IJ SOLN: 0.4000 mg | INTRAMUSCULAR | Status: DC | PRN

## 2014-04-01 MED ORDER — HYDROMORPHONE HCL 2 MG/ML IJ SOLN
2.0000 mg | INTRAMUSCULAR | Status: AC | PRN
  Administered 2014-04-01 (×3): 2 mg via INTRAVENOUS
  Filled 2014-04-01 (×3): qty 1

## 2014-04-01 MED ORDER — SODIUM CHLORIDE 0.9 % IJ SOLN: 9.0000 mL | INTRAMUSCULAR | Status: DC | PRN

## 2014-04-01 MED ORDER — IBUPROFEN 200 MG PO TABS
600.0000 mg | ORAL_TABLET | Freq: Four times a day (QID) | ORAL | Status: DC | PRN
  Administered 2014-04-02: 600 mg via ORAL
  Filled 2014-04-01: qty 3

## 2014-04-01 MED ORDER — FOLIC ACID 1 MG PO TABS: 1.0000 mg | ORAL_TABLET | Freq: Every day | ORAL | Status: DC

## 2014-04-01 MED ORDER — MORPHINE SULFATE ER 30 MG PO TBCR
30.0000 mg | EXTENDED_RELEASE_TABLET | Freq: Two times a day (BID) | ORAL | Status: DC
  Administered 2014-04-01 – 2014-04-04 (×6): 30 mg via ORAL
  Filled 2014-04-01 (×6): qty 1

## 2014-04-01 MED ORDER — ONDANSETRON HCL 4 MG/2ML IJ SOLN: 4.0000 mg | Freq: Four times a day (QID) | INTRAMUSCULAR | Status: DC | PRN

## 2014-04-01 MED ORDER — PROMETHAZINE HCL 25 MG PO TABS
12.5000 mg | ORAL_TABLET | ORAL | Status: DC | PRN
  Administered 2014-04-02: 25 mg via ORAL
  Filled 2014-04-01: qty 1

## 2014-04-01 MED ORDER — DIPHENHYDRAMINE HCL 25 MG PO CAPS: 25.0000 mg | ORAL_CAPSULE | ORAL | Status: DC | PRN

## 2014-04-01 MED ORDER — DIPHENHYDRAMINE HCL 50 MG/ML IJ SOLN: 12.5000 mg | Freq: Four times a day (QID) | INTRAMUSCULAR | Status: DC | PRN

## 2014-04-01 MED ORDER — FOLIC ACID 1 MG PO TABS
1.0000 mg | ORAL_TABLET | Freq: Every day | ORAL | Status: DC
  Administered 2014-04-02 – 2014-04-04 (×3): 1 mg via ORAL
  Filled 2014-04-01 (×3): qty 1

## 2014-04-01 MED ORDER — ADULT MULTIVITAMIN W/MINERALS CH
2.0000 | ORAL_TABLET | Freq: Every day | ORAL | Status: DC
  Administered 2014-04-02 – 2014-04-04 (×3): 2 via ORAL
  Filled 2014-04-01 (×3): qty 2

## 2014-04-01 MED ORDER — DIPHENHYDRAMINE HCL 25 MG PO CAPS
25.0000 mg | ORAL_CAPSULE | ORAL | Status: DC | PRN
  Administered 2014-04-01: 25 mg via ORAL
  Filled 2014-04-01: qty 1

## 2014-04-01 MED ORDER — GABAPENTIN 300 MG PO CAPS
300.0000 mg | ORAL_CAPSULE | Freq: Two times a day (BID) | ORAL | Status: DC
  Administered 2014-04-01 – 2014-04-04 (×6): 300 mg via ORAL
  Filled 2014-04-01 (×7): qty 1

## 2014-04-01 MED ORDER — PROMETHAZINE HCL 25 MG RE SUPP: 12.5000 mg | RECTAL | Status: DC | PRN

## 2014-04-01 MED ORDER — HYDROXYUREA 500 MG PO CAPS
1500.0000 mg | ORAL_CAPSULE | Freq: Every day | ORAL | Status: DC
  Administered 2014-04-02 – 2014-04-04 (×3): 1500 mg via ORAL
  Filled 2014-04-01 (×3): qty 3

## 2014-04-01 MED ORDER — OXYCODONE HCL 5 MG PO TABS
20.0000 mg | ORAL_TABLET | ORAL | Status: DC | PRN
  Administered 2014-04-02 (×2): 20 mg via ORAL
  Filled 2014-04-01 (×2): qty 4

## 2014-04-01 NOTE — ED Notes (Signed)
Pt presents with c/o sickle cell pain in his back and abdomen that started yesterday. Pt denies any N/V/D. Pt denies any shortness of breath or chest pain. NAD at this time, ambulatory to triage.

## 2014-04-01 NOTE — H&P (Signed)
[facility] History and Physical  Alejandro Soto OZD:[id number] DOB: 10-Apr-1986 DOA: 04/01/2014  Referring physician: ED physician PCP: MATTHEWS,MICHELLE A., MD   Chief Complaint: back and abd pain   HPI:  28 y/o male with hx of SS disease, presented to [facility] ED with main concern of one day duration of progressively worsening generalized abd pain, back pain, both constant and sharp, 10/10 in severity, non radiating and with no specific alleviating or aggravating factors. Pt denies chest pain or shortness of breath, reports this seems to be much like his sickle cell pain crisis. He has last seen his PCP 9/15 for the same concern and was doing fairly well until one day PTA. In ED, pt noted to be in mild distress due to pain, given several doses of Dilaudid IV with no significant improvement in symptoms. TRH asked to admit for further evaluation.   Assessment and Plan: Active Problems: Abdominal and back pain - appear to be related to sickle cell pain - will admit to medical floor - place on Dilaudid PCA, provide supportive care with IVF, antiemetics as needed - UA and urine culture pending  - Hg stable, no indication for transfusion   Radiological Exams on Admission: No results found.   Code Status: Full Family Communication: Pt at bedside Disposition Plan: Admit for further evaluation     Review of Systems:  Constitutional: Negative for diaphoresis.  HENT: Negative for hearing loss, ear pain, nosebleeds, congestion, sore throat, neck pain, tinnitus and ear discharge.   Eyes: Negative for blurred vision, double vision, photophobia, pain, discharge and redness.  Respiratory: Negative for cough, hemoptysis, sputum production, shortness of breath, wheezing and stridor.   Cardiovascular: Negative for chest pain, palpitations, orthopnea, claudication and leg swelling.  Gastrointestinal: Negative for heartburn, constipation, blood in stool and melena.  Genitourinary: Negative for  dysuria, urgency, frequency, hematuria and flank pain.  Musculoskeletal: Negative for joint pain and falls.  Skin: Negative for itching and rash.  Neurological: Negative for dizziness and weakness. Endo/Heme/Allergies: Negative for environmental allergies and polydipsia. Does not bruise/bleed easily.  Psychiatric/Behavioral: Negative for suicidal ideas. The patient is not nervous/anxious.      Past Medical History  Diagnosis Date  . Sickle cell anemia   . Pneumonia     Past Surgical History  Procedure Laterality Date  . No past surgeries      Social History:  reports that he has been smoking Cigarettes.  He has been smoking about 0.00 packs per day for the past 5 years. He has never used smokeless tobacco. He reports that he does not drink alcohol or use illicit drugs.  Allergies  Allergen Reactions  . Amoxicillin Diarrhea    Family History  Problem Relation Age of Onset  . Diabetes Father   . Cancer Father 49    Prostate  . Asthma Brother   . Cancer Maternal Grandmother     colon     Prior to Admission medications   Medication Sig Start Date End Date Taking? Authorizing Provider  folic acid (FOLVITE) 1 MG tablet Take 1 mg by mouth daily.   Yes Historical Provider, MD  gabapentin (NEURONTIN) 300 MG capsule Take 1 capsule (300 mg total) by mouth 2 (two) times daily. 09/07/13  Yes Leana Gamer, MD  hydroxyurea (HYDREA) 500 MG capsule Take 3 capsules (1,500 mg total) by mouth daily. May take with food to minimize GI side effects. 02/22/14  Yes Leana Gamer, MD  ibuprofen (ADVIL,MOTRIN) 600 MG tablet Take 1  tablet (600 mg total) by mouth every 6 (six) hours as needed for mild pain or moderate pain. 12/15/13  Yes Leana Gamer, MD  morphine (MS CONTIN) 30 MG 12 hr tablet Take 1 tablet (30 mg total) by mouth every 12 (twelve) hours. 02/22/14  Yes Leana Gamer, MD  Multiple Vitamin (MULTIVITAMIN) tablet Take 2 tablets by mouth daily.    Yes Historical  Provider, MD  Oxycodone HCl 20 MG TABS Take 1 tablet (20 mg total) by mouth every 4 (four) hours as needed. 03/22/14  Yes Dorena Dew, FNP    Physical Exam: Filed Vitals:   04/01/14 1751 04/01/14 2031  BP: 123/74 121/62  Pulse: 71 84  Temp: 98.2 F (36.8 C)   TempSrc: Oral   Resp: 18 17  SpO2: 95% 100%    Physical Exam  Constitutional: Appears well-developed and well-nourished. No distress.  HENT: Normocephalic. External right and left ear normal. Oropharynx is clear and moist.  Eyes: Conjunctivae and EOM are normal. PERRLA, no scleral icterus.  Neck: Normal ROM. Neck supple. No JVD. No tracheal deviation. No thyromegaly.  CVS: RRR, S1/S2 +, no murmurs, no gallops, no carotid bruit.  Pulmonary: Effort and breath sounds normal, no stridor, rhonchi, wheezes, rales.  Abdominal: Soft. BS +,  no distension, tenderness, rebound or guarding.  Musculoskeletal: Normal range of motion. No edema and no tenderness.  Lymphadenopathy: No lymphadenopathy noted, cervical, inguinal. Neuro: Alert. Normal reflexes, muscle tone coordination. No cranial nerve deficit. Skin: Skin is warm and dry. No rash noted. Not diaphoretic. No erythema. No pallor.  Psychiatric: Normal mood and affect. Behavior, judgment, thought content normal.   Labs on Admission:  Basic Metabolic Panel:  Recent Labs Lab 04/01/14 1942  NA 140  K 4.2  CL 103  CO2 23  GLUCOSE 91  BUN 13  CREATININE 0.68  CALCIUM 9.6   Liver Function Tests:  Recent Labs Lab 04/01/14 1942  AST 34  ALT 20  ALKPHOS 54  BILITOT 1.3*  PROT 8.5*  ALBUMIN 4.7   CBC:  Recent Labs Lab 04/01/14 1942  WBC PENDING  NEUTROABS PENDING  HGB 11.0*  HCT 30.2*  MCV 89.6  PLT 487*    EKG: Normal sinus rhythm, no ST/T wave changes  Faye Ramsay, MD  [facility] Pager [phone]  If 7PM-7AM, please contact night-coverage [email or web address] Password [email or web address] 04/01/2014, 8:58 PM

## 2014-04-01 NOTE — ED Provider Notes (Signed)
CSN: [id number]     Arrival date & time 04/01/14  1742 History   First MD Initiated Contact with Patient 04/01/14 1858     Chief Complaint  Patient presents with  . Sickle Cell Pain Crisis     (Consider location/radiation/quality/duration/timing/severity/associated sxs/prior Treatment) HPI Comments: Pt is a 28 y/o male with hx of SS disease - was seen in [facility] in 9/15 for same and followed by Dr. Zigmund Daniel.  He reports pain in the abdomen and back - this started yesterday, has been persistent but has improved and the abdominal pain is totally resolved.  He has bilateral lower back pain in the lumbar region which is not any better with his home oxycodone 20mg  tabs.  He denies fevers, cough, sob, cp or any other c/o.  Patient is a 28 y.o. male presenting with sickle cell pain. The history is provided by the patient.  Sickle Cell Pain Crisis   Past Medical History  Diagnosis Date  . Sickle cell anemia   . Pneumonia    Past Surgical History  Procedure Laterality Date  . No past surgeries     Family History  Problem Relation Age of Onset  . Diabetes Father   . Cancer Father 52    Prostate  . Asthma Brother   . Cancer Maternal Grandmother     colon    History  Substance Use Topics  . Smoking status: Current Some Day Smoker -- 5 years    Types: Cigarettes  . Smokeless tobacco: Never Used     Comment: 1-3 daily  . Alcohol Use: No    Review of Systems  All other systems reviewed and are negative.     Allergies  Amoxicillin  Home Medications   Prior to Admission medications   Medication Sig Start Date End Date Taking? Authorizing Provider  folic acid (FOLVITE) 1 MG tablet Take 1 mg by mouth daily.    Historical Provider, MD  gabapentin (NEURONTIN) 300 MG capsule Take 1 capsule (300 mg total) by mouth 2 (two) times daily. 09/07/13   Leana Gamer, MD  hydroxyurea (HYDREA) 500 MG capsule Take 3 capsules (1,500 mg total) by mouth daily. May take with food to  minimize GI side effects. 02/22/14   Leana Gamer, MD  ibuprofen (ADVIL,MOTRIN) 600 MG tablet Take 1 tablet (600 mg total) by mouth every 6 (six) hours as needed for mild pain or moderate pain. 12/15/13   Leana Gamer, MD  morphine (MS CONTIN) 30 MG 12 hr tablet Take 1 tablet (30 mg total) by mouth every 12 (twelve) hours. 02/22/14   Leana Gamer, MD  Multiple Vitamin (MULTIVITAMIN) tablet Take 2 tablets by mouth daily.     Historical Provider, MD  Oxycodone HCl 20 MG TABS Take 1 tablet (20 mg total) by mouth every 4 (four) hours as needed. 03/22/14   Dorena Dew, FNP   BP 123/74  Pulse 71  Temp(Src) 98.2 F (36.8 C) (Oral)  Resp 18  SpO2 95% Physical Exam  Nursing note and vitals reviewed. Constitutional: He appears well-developed and well-nourished. No distress.  HENT:  Head: Normocephalic and atraumatic.  Mouth/Throat: Oropharynx is clear and moist. No oropharyngeal exudate.  Eyes: Conjunctivae and EOM are normal. Pupils are equal, round, and reactive to light. Right eye exhibits no discharge. Left eye exhibits no discharge. No scleral icterus.  Neck: Normal range of motion. Neck supple. No JVD present. No thyromegaly present.  Cardiovascular: Normal rate, regular rhythm, normal heart  sounds and intact distal pulses.  Exam reveals no gallop and no friction rub.   No murmur heard. Pulmonary/Chest: Effort normal and breath sounds normal. No respiratory distress. He has no wheezes. He has no rales.  Abdominal: Soft. Bowel sounds are normal. He exhibits no distension and no mass. There is no tenderness.  No abd ttp  Musculoskeletal: Normal range of motion. He exhibits tenderness ( ttp across the lower back, no masses, no redness). He exhibits no edema.  Joints are supple, compartments are soft.  Lymphadenopathy:    He has no cervical adenopathy.  Neurological: He is alert. Coordination normal.  Skin: Skin is warm and dry. No rash noted. No erythema.  Psychiatric: He  has a normal mood and affect. His behavior is normal.    ED Course  Procedures (including critical care time) Labs Review Labs Reviewed  CBC WITH DIFFERENTIAL  COMPREHENSIVE METABOLIC PANEL  RETICULOCYTES    Imaging Review No results found.    MDM   Final diagnoses:  None    Well appaering, VS normal  =- back pain - rare ED visits, pain meds, recheck, labs.  3 doses of meds, no improvement, labs unremarkable overall  D/w Dr. Doyle Askew who will admit  Meds given in ED:  Medications  diphenhydrAMINE (BENADRYL) capsule 25 mg (25 mg Oral Given 04/01/14 1944)  sodium chloride 0.9 % bolus 1,000 mL (0 mLs Intravenous Stopped 04/01/14 2049)  HYDROmorphone (DILAUDID) injection 2 mg (2 mg Intravenous Given 04/01/14 2049)        Johnna Acosta, MD 04/01/14 2059

## 2014-04-02 DIAGNOSIS — D5700 Hb-SS disease with crisis, unspecified: Principal | ICD-10-CM

## 2014-04-02 DIAGNOSIS — G894 Chronic pain syndrome: Secondary | ICD-10-CM

## 2014-04-02 LAB — URINALYSIS, ROUTINE W REFLEX MICROSCOPIC
Bilirubin Urine: NEGATIVE
Glucose, UA: NEGATIVE mg/dL
Hgb urine dipstick: NEGATIVE
Ketones, ur: NEGATIVE mg/dL
LEUKOCYTES UA: NEGATIVE
NITRITE: NEGATIVE
PH: 6 (ref 5.0–8.0)
Protein, ur: NEGATIVE mg/dL
Specific Gravity, Urine: 1.01 (ref 1.005–1.030)
Urobilinogen, UA: 1 mg/dL (ref 0.0–1.0)

## 2014-04-02 LAB — CBC WITH DIFFERENTIAL/PLATELET
BASOS PCT: 0 % (ref 0–1)
Basophils Absolute: 0 10*3/uL (ref 0.0–0.1)
EOS PCT: 3 % (ref 0–5)
Eosinophils Absolute: 0.4 10*3/uL (ref 0.0–0.7)
HEMATOCRIT: 25.9 % — AB (ref 39.0–52.0)
HEMOGLOBIN: 9.4 g/dL — AB (ref 13.0–17.0)
LYMPHS PCT: 38 % (ref 12–46)
Lymphs Abs: 5.6 10*3/uL — ABNORMAL HIGH (ref 0.7–4.0)
MCH: 32.1 pg (ref 26.0–34.0)
MCHC: 36.3 g/dL — AB (ref 30.0–36.0)
MCV: 88.4 fL (ref 78.0–100.0)
MONOS PCT: 9 % (ref 3–12)
Monocytes Absolute: 1.3 10*3/uL — ABNORMAL HIGH (ref 0.1–1.0)
NRBC: 1 /100{WBCs} — AB
Neutro Abs: 7.4 10*3/uL (ref 1.7–7.7)
Neutrophils Relative %: 50 % (ref 43–77)
Platelets: 432 10*3/uL — ABNORMAL HIGH (ref 150–400)
RBC: 2.93 MIL/uL — AB (ref 4.22–5.81)
RDW: 15 % (ref 11.5–15.5)
WBC: 14.7 10*3/uL — ABNORMAL HIGH (ref 4.0–10.5)

## 2014-04-02 LAB — LACTATE DEHYDROGENASE: LDH: 235 U/L (ref 94–250)

## 2014-04-02 MED ORDER — NALOXONE HCL 0.4 MG/ML IJ SOLN: 0.4000 mg | INTRAMUSCULAR | Status: DC | PRN

## 2014-04-02 MED ORDER — ONDANSETRON HCL 4 MG/2ML IJ SOLN: 4.0000 mg | Freq: Four times a day (QID) | INTRAMUSCULAR | Status: DC | PRN

## 2014-04-02 MED ORDER — INFLUENZA VAC SPLIT QUAD 0.5 ML IM SUSY
0.5000 mL | PREFILLED_SYRINGE | INTRAMUSCULAR | Status: AC | PRN
  Filled 2014-04-02: qty 0.5

## 2014-04-02 MED ORDER — SODIUM CHLORIDE 0.9 % IJ SOLN: 9.0000 mL | INTRAMUSCULAR | Status: DC | PRN

## 2014-04-02 MED ORDER — HYDROMORPHONE 2 MG/ML HIGH CONCENTRATION IV PCA SOLN
INTRAVENOUS | Status: DC
  Administered 2014-04-02: 16.2 mg via INTRAVENOUS
  Administered 2014-04-02: 11:00:00 via INTRAVENOUS
  Administered 2014-04-02: 10.8 mg via INTRAVENOUS
  Administered 2014-04-02: 7.8 mg via INTRAVENOUS
  Administered 2014-04-03: 50 mg via INTRAVENOUS
  Administered 2014-04-03: 5.4 mg via INTRAVENOUS
  Administered 2014-04-03: 16.6 mg via INTRAVENOUS
  Filled 2014-04-02: qty 25

## 2014-04-02 NOTE — Progress Notes (Signed)
7.2 mg 17 attempts/16 delivered for 04/02/14 1740-8144.

## 2014-04-02 NOTE — Progress Notes (Signed)
SICKLE CELL SERVICE PROGRESS NOTE  Alejandro Soto DZH:[id number] DOB: December 06, 1985 DOA: 04/01/2014 PCP: Tymere Depuy A., MD  Assessment/Plan: Active Problems:   Sickle cell anemia with pain  1. Hb SS with crisis: Pt was seen in the Pain Clinic and his opiates were reduced from a  MED of 240 mg down to 45 mg by pain clinic. This triggered a crisis likley secondary to withdrawal and pt presented to ED in crisis. Pt is opiate tolerant and is currently undertreated with PCA dosing. Will adjust PCA to custom dose, add Toradol and continue IVF. Will reassess pain tomorrow.  2. Chronic Pain: I have had several discussion with patient about the need to significantly decrease the dose of opiates over time by decreasing by 10% every 1-2 weeks. Pt has been resistant and requested pain clinic referral as he felt that were being unconcerned about his pain.  After his experience at the Pain Clinic with a drastic decrease of the chronic pain medication he is now willing to embark on gradual dose reduction and states that he does not intend to return to the pain clinic.  3. Sickle Cell Disease: Pt is compliant with Hydrea and Folic acid. No dose adjustments required.   Code Status: Full Code Family Communication: N/A Disposition Plan: Not yet ready for discharge  [place].  Pager [phone]. If 7PM-7AM, please contact night-coverage.  04/02/2014, 8:34 AM  LOS: 1 day   Brief narrative: 28 y/o male with hx of SS disease, presented to [facility] ED with main concern of one day duration of progressively worsening generalized abd pain, back pain, both constant and sharp, 10/10 in severity, non radiating and with no specific alleviating or aggravating factors. Pt denies chest pain or shortness of breath, reports this seems to be much like his sickle cell pain crisis. He has last seen his PCP 9/15 for the same concern and was doing fairly well until one day PTA. In ED, pt noted to be in mild distress due to pain,  given several doses of Dilaudid IV with no significant improvement in symptoms. TRH asked to admit for further evaluation.   Consultants:  None  Procedures:  None  Antibiotics:  None  HPI/Subjective:  Pt was seen in the Pain Clinic and his opiates were reduced from a  MED of 240 mg down to 45 mg by pain clinic. This triggered a crisis likley secondary to withdrawal and pt presented to ED in crisis. Pt reports pain is localized to back and legs. He describes it as a throbbing pain. He has no associated symptoms.  Objective: Filed Vitals:   04/02/14 0255 04/02/14 0425 04/02/14 0457 04/02/14 0546  BP: 122/69  117/63   Pulse: 59  63   Temp: 98.2 F (36.8 C)  98.1 F (36.7 C)   TempSrc: Oral  Oral   Resp: 18 16 18 14   Height:      Weight:      SpO2: 100% 100% 100% 100%   Weight change:   Intake/Output Summary (Last 24 hours) at 04/02/14 0834 Last data filed at 04/02/14 0400  Gross per 24 hour  Intake   1000 ml  Output    725 ml  Net    275 ml    General: Alert, awake, oriented x3, in no acute distress.  HEENT: [place]/AT PEERL, EOMI, anicteric Neck: Trachea midline,  no masses, no thyromegal,y no JVD, no carotid bruit OROPHARYNX:  Moist, No exudate/ erythema/lesions.  Heart: Regular rate and rhythm, without murmurs, rubs, gallops.  Lungs: Clear to auscultation, no wheezing or rhonchi noted.  Abdomen: Soft, nontender, nondistended, positive bowel sounds, no masses no hepatosplenomegaly noted..  Neuro: No focal neurological deficits noted cranial nerves II through XII grossly intact.  Strength normal in bilateral upper and lower extremities. Musculoskeletal: No warm swelling or erythema around joints, no spinal tenderness noted. Psychiatric: Patient alert and oriented x3, good insight and cognition, good recent to remote recall. Lymph node survey: No cervical axillary or inguinal lymphadenopathy noted.   Data Reviewed: Basic Metabolic Panel:  Recent Labs Lab  04/01/14 1942  NA 140  K 4.2  CL 103  CO2 23  GLUCOSE 91  BUN 13  CREATININE 0.68  CALCIUM 9.6   Liver Function Tests:  Recent Labs Lab 04/01/14 1942  AST 34  ALT 20  ALKPHOS 54  BILITOT 1.3*  PROT 8.5*  ALBUMIN 4.7   No results found for this basename: LIPASE, AMYLASE,  in the last 168 hours No results found for this basename: AMMONIA,  in the last 168 hours CBC:  Recent Labs Lab 04/01/14 1942  WBC 16.0*  NEUTROABS 11.7*  HGB 11.0*  HCT 30.2*  MCV 89.6  PLT 487*   Cardiac Enzymes: No results found for this basename: CKTOTAL, CKMB, CKMBINDEX, TROPONINI,  in the last 168 hours BNP (last 3 results) No results found for this basename: PROBNP,  in the last 8760 hours CBG: No results found for this basename: GLUCAP,  in the last 168 hours  No results found for this or any previous visit (from the past 240 hour(s)).   Studies: No results found.  Scheduled Meds: . enoxaparin (LOVENOX) injection  40 mg Subcutaneous QHS  . folic acid  1 mg Oral Daily  . gabapentin  300 mg Oral BID  . HYDROmorphone PCA 0.3 mg/mL   Intravenous 6 times per day  . hydroxyurea  1,500 mg Oral Daily  . morphine  30 mg Oral Q12H  . multivitamin with minerals  2 tablet Oral Daily   Continuous Infusions: . sodium chloride 75 mL/hr (04/02/14 0005)    Total time spent 45 minutes.

## 2014-04-02 NOTE — Progress Notes (Signed)
3.5mg   Infused from 04/02/14 0383-3383

## 2014-04-03 LAB — CBC WITH DIFFERENTIAL/PLATELET
BASOS ABS: 0 10*3/uL (ref 0.0–0.1)
Basophils Relative: 0 % (ref 0–1)
Eosinophils Absolute: 0.3 10*3/uL (ref 0.0–0.7)
Eosinophils Relative: 2 % (ref 0–5)
HCT: 26.1 % — ABNORMAL LOW (ref 39.0–52.0)
Hemoglobin: 9.4 g/dL — ABNORMAL LOW (ref 13.0–17.0)
LYMPHS PCT: 28 % (ref 12–46)
Lymphs Abs: 3.9 10*3/uL (ref 0.7–4.0)
MCH: 32.9 pg (ref 26.0–34.0)
MCHC: 36 g/dL (ref 30.0–36.0)
MCV: 91.3 fL (ref 78.0–100.0)
MONO ABS: 1.5 10*3/uL — AB (ref 0.1–1.0)
MONOS PCT: 11 % (ref 3–12)
Neutro Abs: 8.2 10*3/uL — ABNORMAL HIGH (ref 1.7–7.7)
Neutrophils Relative %: 59 % (ref 43–77)
Platelets: 429 10*3/uL — ABNORMAL HIGH (ref 150–400)
RBC: 2.86 MIL/uL — ABNORMAL LOW (ref 4.22–5.81)
RDW: 15.8 % — AB (ref 11.5–15.5)
WBC: 13.9 10*3/uL — AB (ref 4.0–10.5)
nRBC: 1 /100 WBC — ABNORMAL HIGH

## 2014-04-03 LAB — URINE CULTURE
CULTURE: NO GROWTH
Colony Count: NO GROWTH

## 2014-04-03 LAB — PRESCRIPTION MONITORING PROFILE (SOLSTAS)
Amphetamine/Meth: NEGATIVE ng/mL
BARBITURATE SCREEN, URINE: NEGATIVE ng/mL
Benzodiazepine Screen, Urine: NEGATIVE ng/mL
Buprenorphine, Urine: NEGATIVE ng/mL
CREATININE, URINE: 135.18 mg/dL (ref >20.0)
Carisoprodol, Urine: NEGATIVE ng/mL
Cocaine Metabolites: NEGATIVE ng/mL
Fentanyl, Ur: NEGATIVE ng/mL
MDMA URINE: NEGATIVE ng/mL
Meperidine, Ur: NEGATIVE ng/mL
Methadone Screen, Urine: NEGATIVE ng/mL
NITRITES URINE, INITIAL: NEGATIVE ug/mL
PH URINE, INITIAL: 5.3 pH (ref 4.5–8.9)
Propoxyphene: NEGATIVE ng/mL
TRAMADOL UR: NEGATIVE ng/mL
Tapentadol, urine: NEGATIVE ng/mL
Zolpidem, Urine: NEGATIVE ng/mL

## 2014-04-03 MED ORDER — OXYCODONE HCL 5 MG PO TABS
20.0000 mg | ORAL_TABLET | ORAL | Status: DC
  Administered 2014-04-03 – 2014-04-04 (×6): 20 mg via ORAL
  Filled 2014-04-03 (×6): qty 4

## 2014-04-03 MED ORDER — HYDROMORPHONE 2 MG/ML HIGH CONCENTRATION IV PCA SOLN
INTRAVENOUS | Status: DC
  Administered 2014-04-03: 10.2 mg via INTRAVENOUS
  Administered 2014-04-03: 8 mg via INTRAVENOUS
  Administered 2014-04-04: 7.4 mg via INTRAVENOUS
  Administered 2014-04-04: 06:00:00 via INTRAVENOUS
  Administered 2014-04-04: 54.7 mg via INTRAVENOUS
  Filled 2014-04-03: qty 25

## 2014-04-03 MED ORDER — NICOTINE 14 MG/24HR TD PT24
14.0000 mg | MEDICATED_PATCH | Freq: Every day | TRANSDERMAL | Status: DC
  Filled 2014-04-03 (×2): qty 1

## 2014-04-03 NOTE — Progress Notes (Addendum)
Patient alert and oriented, eating breakfast this morning at 0715, tolerating well, pain level 7/10, location abd and back, pain controlled with PCA, patient in stable condition at this time, result from PCA use are as follows: Total Drug= 37.2 mg Total Demand= 72 mg Delivery= 62 mg Will continue to monitor patient for the remainder of shift

## 2014-04-03 NOTE — Progress Notes (Signed)
SICKLE CELL SERVICE PROGRESS NOTE  Berish Bohman ESP:[id number] DOB: 1986-02-17 DOA: 04/01/2014 PCP: MATTHEWS,MICHELLE A., MD  Assessment/Plan: Active Problems:   Sickle cell anemia with pain  1. Hb SS with crisis:  Pain improved wit adjusted dose of Dilaudid via PCA. Pt has used 33 mg with 562/56: Demands/deliveries. I will schedule oral medications and decrease PCA to be used an a PRN in anticipation of discharge tomorrow.Continue Toradol and decrease IVF as patient drinking well.  2. Chronic Pain: I have call ed the pain clinic and briefly spoken with the Medical Assistant who promised that a Provider would return the call but so far have not heard from anyone. However patietn has made a decision not to retr=urn to the pain clinic. I have had several discussion with patient about the need to significantly decrease the dose of opiates over time by decreasing by 10% every 1-2 weeks. Pt has been resistant and requested pain clinic referral as he felt that were being unconcerned about his pain. After his experience at the Pain Clinic with a drastic decrease of the chronic pain medication he is now willing to embark on gradual dose reduction and states that he does not intend to return to the pain clinic.   Code Status: Full Code Family Communication: N/A Disposition Plan: Anticipate discharge home tomorrow  MATTHEWS,MICHELLE A.  Pager [phone]. If 7PM-7AM, please contact night-coverage.  04/03/2014, 5:31 PM  LOS: 2 days   Brief narrative: 28 y/o male with hx of SS disease, presented to [facility] ED with main concern of one day duration of progressively worsening generalized abd pain, back pain, both constant and sharp, 10/10 in severity, non radiating and with no specific alleviating or aggravating factors. Pt denies chest pain or shortness of breath, reports this seems to be much like his sickle cell pain crisis. He has last seen his PCP 9/15 for the same concern and was doing fairly well until one day  PTA. In ED, pt noted to be in mild distress due to pain, given several doses of Dilaudid IV with no significant improvement in symptoms. TRH asked to admit for further evaluation   Consultants:  None  Procedures:  None  Antibiotics:  None  HPI/Subjective: Pt states that he feels a lot better today. Pain decreasing in back and legs. Had BM today.  Objective: Filed Vitals:   04/03/14 0548 04/03/14 1015 04/03/14 1200 04/03/14 1505  BP: 113/56   116/70  Pulse: 65   82  Temp: 98 F (36.7 C)   98.4 F (36.9 C)  TempSrc: Oral     Resp: 14 20 17 23   Height:      Weight:      SpO2: 100% 99% 100% 99%   Weight change:   Intake/Output Summary (Last 24 hours) at 04/03/14 1731 Last data filed at 04/03/14 1300  Gross per 24 hour  Intake    600 ml  Output   2550 ml  Net  -1950 ml    General: Alert, awake, oriented x3, in no acute distress.  HEENT: [place]/AT PEERL, EOMI, anicteric Neck: Trachea midline,  no masses, no thyromegal,y no JVD, no carotid bruit OROPHARYNX:  Moist, No exudate/ erythema/lesions.  Heart: Regular rate and rhythm, without murmurs, rubs, gallops.  Lungs: Clear to auscultation, no wheezing or rhonchi noted. No increased vocal fremitus resonant to percussion  Abdomen: Soft, nontender, nondistended, positive bowel sounds, no masses no hepatosplenomegaly noted.  Neuro: No focal neurological deficits noted cranial nerves II through XII grossly intact.  DTRs 2+ bilaterally upper and lower extremities. Strength normal in bilateral upper and lower extremities. Musculoskeletal: No warm swelling or erythema around joints, no spinal tenderness noted. Psychiatric: Patient alert and oriented x3, good insight and cognition, good recent to remote recall.    Data Reviewed: Basic Metabolic Panel:  Recent Labs Lab 04/01/14 1942  NA 140  K 4.2  CL 103  CO2 23  GLUCOSE 91  BUN 13  CREATININE 0.68  CALCIUM 9.6   Liver Function Tests:  Recent Labs Lab 04/01/14 1942   AST 34  ALT 20  ALKPHOS 54  BILITOT 1.3*  PROT 8.5*  ALBUMIN 4.7   No results found for this basename: LIPASE, AMYLASE,  in the last 168 hours No results found for this basename: AMMONIA,  in the last 168 hours CBC:  Recent Labs Lab 04/01/14 1942 04/02/14 0926 04/03/14 1000  WBC 16.0* 14.7* 13.9*  NEUTROABS 11.7* 7.4 8.2*  HGB 11.0* 9.4* 9.4*  HCT 30.2* 25.9* 26.1*  MCV 89.6 88.4 91.3  PLT 487* 432* 429*   Cardiac Enzymes: No results found for this basename: CKTOTAL, CKMB, CKMBINDEX, TROPONINI,  in the last 168 hours BNP (last 3 results) No results found for this basename: PROBNP,  in the last 8760 hours CBG: No results found for this basename: GLUCAP,  in the last 168 hours  Recent Results (from the past 240 hour(s))  URINE CULTURE     Status: None   Collection Time    04/02/14 12:04 AM      Result Value Ref Range Status   Specimen Description URINE, CLEAN CATCH   Final   Special Requests NONE   Final   Culture  Setup Time     Final   Value: 04/02/2014 10:33     Performed at [place]     Final   Value: NO GROWTH     Performed at [facility]   Culture     Final   Value: NO GROWTH     Performed at [facility]   Report Status 04/03/2014 FINAL   Final     Studies: No results found.  Scheduled Meds: . enoxaparin (LOVENOX) injection  40 mg Subcutaneous QHS  . folic acid  1 mg Oral Daily  . gabapentin  300 mg Oral BID  . HYDROmorphone PCA 2 mg/mL   Intravenous 6 times per day  . hydroxyurea  1,500 mg Oral Daily  . morphine  30 mg Oral Q12H  . multivitamin with minerals  2 tablet Oral Daily   Continuous Infusions: . sodium chloride 10 mL/hr (04/03/14 1424)    Active Problems:   Sickle cell anemia with pain

## 2014-04-04 MED ORDER — MORPHINE SULFATE ER 30 MG PO TBCR: 30.0000 mg | EXTENDED_RELEASE_TABLET | Freq: Two times a day (BID) | ORAL | Status: DC

## 2014-04-04 MED ORDER — OXYCODONE HCL 20 MG PO TABS: 20.0000 mg | ORAL_TABLET | ORAL | Status: DC | PRN

## 2014-04-04 MED ORDER — HYDROMORPHONE HCL 2 MG/ML IJ SOLN: 2.0000 mg | INTRAMUSCULAR | Status: DC | PRN

## 2014-04-04 NOTE — Discharge Summary (Signed)
Alejandro Soto MRN: [id number] DOB/AGE: 01-06-1986 28 y.o.  Admit date: 04/01/2014 Discharge date: 04/04/2014  Primary Care Physician:  Leylani Duley A., MD   Discharge Diagnoses:   Patient Active Problem List   Diagnosis Date Noted  . Neck pain, bilateral 03/01/2014  . Hb-SS disease without crisis 02/22/2014  . DJD (degenerative joint disease), lumbosacral 09/18/2013  . Unspecified vitamin D deficiency 09/18/2013  . Hyperglycemia 09/18/2013  . Neuropathic pain 09/18/2013  . Back pain, acute 08/11/2013  . Sickle cell crisis 08/11/2013  . Sickle cell anemia with pain 08/11/2013  . Opiate use 05/24/2013  . Chronic pain syndrome 05/24/2013  . Sickle cell disease, type SS 05/24/2013  . Hb-SS disease with crisis 12/12/2012  . Back pain 11/01/2012  . Pain in joint, shoulder region 11/01/2012  . Pain in joint, lower leg 11/01/2012    DISCHARGE MEDICATION:   Medication List         folic acid 1 MG tablet  Commonly known as:  FOLVITE  Take 1 mg by mouth daily.     gabapentin 300 MG capsule  Commonly known as:  NEURONTIN  Take 1 capsule (300 mg total) by mouth 2 (two) times daily.     hydroxyurea 500 MG capsule  Commonly known as:  HYDREA  Take 3 capsules (1,500 mg total) by mouth daily. May take with food to minimize GI side effects.     ibuprofen 600 MG tablet  Commonly known as:  ADVIL,MOTRIN  Take 1 tablet (600 mg total) by mouth every 6 (six) hours as needed for mild pain or moderate pain.     morphine 30 MG 12 hr tablet  Commonly known as:  MS CONTIN  Take 1 tablet (30 mg total) by mouth every 12 (twelve) hours.     multivitamin tablet  Take 2 tablets by mouth daily.     Oxycodone HCl 20 MG Tabs  Take 1 tablet (20 mg total) by mouth every 4 (four) hours as needed.          Recent Results (from the past 240 hour(s))  URINE CULTURE     Status: None   Collection Time    04/02/14 12:04 AM      Result Value Ref Range Status   Specimen Description URINE,  CLEAN CATCH   Final   Special Requests NONE   Final   Culture  Setup Time     Final   Value: 04/02/2014 10:33     Performed at [facility]     Final   Value: NO GROWTH     Performed at [facility]   Culture     Final   Value: NO GROWTH     Performed at [facility]   Report Status 04/03/2014 FINAL   Final    BRIEF ADMITTING H & P: 28 y/o male with hx of SS disease, presented to [facility] ED with main concern of one day duration of progressively worsening generalized abd pain, back pain, both constant and sharp, 10/10 in severity, non radiating and with no specific alleviating or aggravating factors. Pt denies chest pain or shortness of breath, reports this seems to be much like his sickle cell pain crisis. He has last seen his PCP 9/15 for the same concern and was doing fairly well until one day PTA. In ED, pt noted to be in mild distress due to pain, given several doses of Dilaudid IV with no significant improvement in symptoms. TRH asked  to admit for further evaluation    Hospital Course:  Present on Admission: . Chronic Pain with withdrawal symptoms: This is an opiate tolerant patient who  has been on a MED of 300 was abruptly decrease to a MED of 30 at the [facility]. Pt used the 5 mg of oxycodone which was prescribed him by the clinic but after he has exhausted his supply went into withdrawal which triggered a crisis. He has opted not to return to the clinic and we will work with him to decrease his dosing gradually at the recommended 10% decrease/week. Her was discharged home with a proscription for Oxycodone 20 mg #90 and MS Contin 30 mg #10 to be used for the next 5 days.   . Sickle cell anemia with pain: Likely triggered by withdrawal, he was treated initially with IV Dilaudid via PCA then transitioned to oral analgesics. He is discharged home on the above medications.  Disposition and Follow-up:  Discharged home in good condition. Pt to  follow up in the office in 1 week.     Discharge Instructions   Activity as tolerated - No restrictions    Complete by:  As directed      Diet general    Complete by:  As directed            DISCHARGE EXAM:  General: Alert, awake, oriented x3, in no acute distress. Vital Signs: BP 113/67, HR 87, T 98.4 F (36.9 C), temperature source Oral, RR 20, height 5\' 8"  (1.727 m), weight 142 lb 1.6 oz (64.456 kg), SpO2 95.00%. HEENT: [place]/AT PEERL, EOMI, anicteric  Neck: Trachea midline, no masses, no thyromegal,y no JVD, no carotid bruit  OROPHARYNX: Moist, No exudate/ erythema/lesions.  Heart: Regular rate and rhythm, without murmurs, rubs, gallops.  Lungs: Clear to auscultation, no wheezing or rhonchi noted.  Abdomen: Soft, nontender, nondistended, positive bowel sounds, no masses no hepatosplenomegaly noted..  Neuro: No focal neurological deficits noted cranial nerves II through XII grossly intact. Strength normal in bilateral upper and lower extremities.  Musculoskeletal: No warm swelling or erythema around joints, no spinal tenderness noted.  Psychiatric: Patient alert and oriented x3, good insight and cognition, good recent to remote recall.  Lymph node survey: No cervical axillary or inguinal lymphadenopathy noted.     Recent Labs  04/01/14 1942  NA 140  K 4.2  CL 103  CO2 23  GLUCOSE 91  BUN 13  CREATININE 0.68  CALCIUM 9.6    Recent Labs  04/01/14 1942  AST 34  ALT 20  ALKPHOS 54  BILITOT 1.3*  PROT 8.5*  ALBUMIN 4.7   No results found for this basename: LIPASE, AMYLASE,  in the last 72 hours  Recent Labs  04/02/14 0926 04/03/14 1000  WBC 14.7* 13.9*  NEUTROABS 7.4 8.2*  HGB 9.4* 9.4*  HCT 25.9* 26.1*  MCV 88.4 91.3  PLT 432* 429*   Total time spent in decision making and face to face exceeded 30 minutes.  Signed: Mikolaj Woolstenhulme A. 04/04/2014, 4:56 PM

## 2014-04-04 NOTE — Progress Notes (Signed)
Patient alert and oriented, lying bed watching TV without any concerns at this time, pain rated 6/10 by patient, voiding clear yellow urine, ambulating in room and hallway, without any distress, Sats = 99% on RA, HR =69, RR= 16 while lying in bed, results from PCA use are as follows in 24 hr period: Total Drug = 54.7 mg Total Demand = 131 Delivery = 102 Patient in stable condition at this time will continue to monitor for the remainder of shift

## 2014-04-04 NOTE — Progress Notes (Addendum)
Patient discharge home, alert and oriented, discharge instructions given, patient verbalize understanding of discharge instructions given, My Chart access declined at this time, patient in stable condition at this time 

## 2014-04-17 ENCOUNTER — Telehealth: Payer: Self-pay | Admitting: Internal Medicine

## 2014-04-17 NOTE — Telephone Encounter (Signed)
Refill request on oxycodone 20mg . Please advise. Thanks! LOV 03/01/2014

## 2014-04-18 NOTE — Telephone Encounter (Signed)
CAN YOU PLEASE MAKE PATIENT AN APPOINTMENT. THANKS!

## 2014-04-18 NOTE — Telephone Encounter (Signed)
Patient will need an appointment to discuss medication regimen. Refill request inappropriate.    Dorena Dew, FNP

## 2014-04-24 ENCOUNTER — Ambulatory Visit: Payer: Medicare Other | Admitting: Family Medicine

## 2014-04-24 ENCOUNTER — Telehealth: Payer: Self-pay | Admitting: Internal Medicine

## 2014-04-24 NOTE — Telephone Encounter (Signed)
Patient called regarding RX stating he needs an appointment with MD prior to receiving refill. No appointments available this week.

## 2014-04-25 ENCOUNTER — Encounter: Payer: Self-pay | Admitting: Family Medicine

## 2014-04-25 ENCOUNTER — Ambulatory Visit (INDEPENDENT_AMBULATORY_CARE_PROVIDER_SITE_OTHER): Payer: Medicare Other | Admitting: Family Medicine

## 2014-04-25 ENCOUNTER — Other Ambulatory Visit: Payer: Self-pay | Admitting: Family Medicine

## 2014-04-25 VITALS — BP 126/76 | HR 77 | Temp 98.2°F | Resp 16 | Ht 68.0 in | Wt 147.0 lb

## 2014-04-25 DIAGNOSIS — D571 Sickle-cell disease without crisis: Secondary | ICD-10-CM | POA: Diagnosis not present

## 2014-04-25 DIAGNOSIS — E559 Vitamin D deficiency, unspecified: Secondary | ICD-10-CM | POA: Diagnosis not present

## 2014-04-25 DIAGNOSIS — G894 Chronic pain syndrome: Secondary | ICD-10-CM | POA: Diagnosis not present

## 2014-04-25 DIAGNOSIS — F17200 Nicotine dependence, unspecified, uncomplicated: Secondary | ICD-10-CM | POA: Insufficient documentation

## 2014-04-25 LAB — COMPLETE METABOLIC PANEL WITH GFR
ALBUMIN: 4.6 g/dL (ref 3.5–5.2)
ALT: 12 U/L (ref 0–53)
AST: 23 U/L (ref 0–37)
Alkaline Phosphatase: 46 U/L (ref 39–117)
BUN: 5 mg/dL — ABNORMAL LOW (ref 6–23)
CHLORIDE: 104 meq/L (ref 96–112)
CO2: 28 mEq/L (ref 19–32)
Calcium: 9.3 mg/dL (ref 8.4–10.5)
Creat: 0.8 mg/dL (ref 0.50–1.35)
GLUCOSE: 72 mg/dL (ref 70–99)
POTASSIUM: 4.1 meq/L (ref 3.5–5.3)
SODIUM: 139 meq/L (ref 135–145)
Total Bilirubin: 1 mg/dL (ref 0.2–1.2)
Total Protein: 7.2 g/dL (ref 6.0–8.3)

## 2014-04-25 MED ORDER — OXYCODONE HCL 20 MG PO TABS: 20.0000 mg | ORAL_TABLET | ORAL | Status: DC | PRN

## 2014-04-25 NOTE — Progress Notes (Signed)
Subjective:    Patient ID: Alejandro Soto, male    DOB: Nov 11, 1985, 28 y.o.   MRN: [id number]  HPI Alejandro Soto, patient with a history of sickle cell anemia, HbSS presents for a follow up of sickle cell anemia and medication management . Alejandro Soto was recently hospitalized on 04/01/2014 for a vaso-occlusive crisis. He states that his pain medications were adjusted at [facility]. He was decreased from an opiate equivalence of 300 to 30. Patient used Oxycodone . He states that he depleted pain medications quickly and experienced withdrawal symptoms, which triggered a pain crisis. He states that he currently has 6/10 pain to lower extremities. He last had Oxycodone 20 mg on Monday night with moderate relief. He state that he has been taking Hydrea and folic acid consistently. He denies fever, shortness of breath, chest pains, nausea, vomiting, or diarrhea.   Past Medical History  Diagnosis Date  . Sickle cell anemia   . Pneumonia     Review of Systems  Constitutional: Negative for fatigue.  HENT: Negative.   Eyes: Negative.   Respiratory: Negative for shortness of breath and wheezing.   Cardiovascular: Negative.   Gastrointestinal: Negative.   Endocrine: Negative.   Genitourinary: Negative.   Musculoskeletal: Positive for myalgias (chronic lower extremity pain).  Skin: Negative.   Allergic/Immunologic: Negative.   Neurological: Negative.   Hematological: Negative.   Psychiatric/Behavioral: Negative for sleep disturbance.       Objective:   Physical Exam  Constitutional: He is oriented to person, place, and time. He appears well-developed and well-nourished.  HENT:  Head: Normocephalic and atraumatic.  Right Ear: External ear normal.  Left Ear: External ear normal.  Eyes: Conjunctivae and EOM are normal. Pupils are equal, round, and reactive to light. No scleral icterus.  Neck: Normal range of motion. Neck supple.  Pulmonary/Chest: Effort normal and breath sounds normal.   Abdominal: Soft. Bowel sounds are normal.  Musculoskeletal: Normal range of motion.  Neurological: He is alert and oriented to person, place, and time. He has normal reflexes.  Skin: Skin is warm and dry.  Psychiatric: He has a normal mood and affect. His behavior is normal. Judgment and thought content normal.      BP 126/76 mmHg  Pulse 77  Temp(Src) 98.2 F (36.8 C) (Oral)  Resp 16  Ht 5\' 8"  (1.727 m)  Wt 147 lb (66.679 kg)  BMI 22.36 kg/m2    Assessment & Plan:   1. Hb-SS disease without crisis 1. Sickle cell disease - Continue Hydrea 500 mg twice daily. We discussed the need for good hydration, monitoring of hydration status, avoidance of heat, cold, stress, and infection triggers. We discussed the risks and benefits of Hydrea, including bone marrow suppression, the possibility of GI upset, skin ulcers, hair thinning, and teratogenicity. The patient was reminded of the need to seek medical attention of any symptoms of bleeding, anemia, or infection. Continue folic acid 1 mg daily to prevent aplastic bone marrow crises.   Eye - High risk of proliferative retinopathy. Annual eye exam with retinal exam recommended to patient. Alejandro Soto is current on eye examination  Alejandro Soto states that he does not have a hematologist and will need a referral.    Immunization status - All immunizations up to date  Acute and chronic painful episodes -  We agreed on a plan to titrate his medication dosage. We discussed that he is to receive his Schedule II narcotics only through [place].  We reminded that all  patients receiving Schedule II narcotics must be seen for follow up every three months. We reviewed the terms of our pain agreement, including the need to keep medicines in a safe locked location away from children or pets, and the need to report excess sedation or constipation, measures to avoid constipation, and policies related to early refills and stolen prescriptions. According to the [place] [facility]  Pain [facility] program, we have reviewed details related to analgesia, adverse effects, aberrant behaviors.   - Oxycodone HCl 20 MG TABS; Take 1 tablet (20 mg total) by mouth every 4 (four) hours as needed.  Dispense: 90 tablet; Refill: 0 Reviewed [place] Substance Reporting system prior to reorder  - CBC with Differential - COMPLETE METABOLIC PANEL WITH GFR   2. Chronic pain syndrome Patient has chronic pain and was seen by [facility]. Alejandro Soto  is an opiate tolerant patient who has been on a MED of 300 was abruptly decrease to a MED of 30 at the [facility]. He exhausted his pain medication regimen and was hospitalized in early October. Dr. Zigmund Daniel recommended that we decrease his dose successively on a monthly basis. We will see Alejandro Soto in 1 month to discuss pain regimen. We recommended reducing medication dosage by 10% decrease/week.  Patient is currently not taking MS Contin, he has been weaned off.   3. Vitamin D deficiency  We will check vitamin D level    4. Tobacco dependence Alejandro Soto states that he smokes 0.5 packs per day. He states that he is cutting down, but is not ready to start interventions to prevent smoking. Patient is in the pre contemplative stage. Smoking cessation instruction/counseling given:  counseled patient on the dangers of tobacco use, advised patient to stop smoking, and reviewed strategies to maximize success  RTC: 1 month to review medications Dorena Dew, FNP

## 2014-04-26 LAB — CBC WITH DIFFERENTIAL/PLATELET
Basophils Absolute: 0 10*3/uL (ref 0.0–0.1)
Basophils Relative: 0 % (ref 0–1)
Eosinophils Absolute: 0.5 10*3/uL (ref 0.0–0.7)
Eosinophils Relative: 3 % (ref 0–5)
HCT: 31.7 % — ABNORMAL LOW (ref 39.0–52.0)
HEMOGLOBIN: 11 g/dL — AB (ref 13.0–17.0)
LYMPHS ABS: 4.4 10*3/uL — AB (ref 0.7–4.0)
LYMPHS PCT: 25 % (ref 12–46)
MCH: 30.9 pg (ref 26.0–34.0)
MCHC: 34.7 g/dL (ref 30.0–36.0)
MCV: 89 fL (ref 78.0–100.0)
MONOS PCT: 8 % (ref 3–12)
Monocytes Absolute: 1.4 10*3/uL — ABNORMAL HIGH (ref 0.1–1.0)
Neutro Abs: 11.2 10*3/uL — ABNORMAL HIGH (ref 1.7–7.7)
Neutrophils Relative %: 64 % (ref 43–77)
Platelets: 398 10*3/uL (ref 150–400)
RBC: 3.56 MIL/uL — AB (ref 4.22–5.81)
RDW: 16 % — ABNORMAL HIGH (ref 11.5–15.5)
WBC: 17.5 10*3/uL — AB (ref 4.0–10.5)

## 2014-04-27 ENCOUNTER — Ambulatory Visit: Payer: Medicare Other | Admitting: Family Medicine

## 2014-05-02 DIAGNOSIS — E875 Hyperkalemia: Secondary | ICD-10-CM | POA: Diagnosis not present

## 2014-05-02 DIAGNOSIS — D571 Sickle-cell disease without crisis: Secondary | ICD-10-CM | POA: Diagnosis not present

## 2014-05-02 DIAGNOSIS — D72829 Elevated white blood cell count, unspecified: Secondary | ICD-10-CM | POA: Diagnosis present

## 2014-05-02 DIAGNOSIS — R1084 Generalized abdominal pain: Secondary | ICD-10-CM | POA: Diagnosis not present

## 2014-05-02 DIAGNOSIS — R109 Unspecified abdominal pain: Secondary | ICD-10-CM | POA: Diagnosis not present

## 2014-05-02 DIAGNOSIS — K5900 Constipation, unspecified: Secondary | ICD-10-CM | POA: Diagnosis not present

## 2014-05-02 DIAGNOSIS — E876 Hypokalemia: Secondary | ICD-10-CM | POA: Diagnosis not present

## 2014-05-02 DIAGNOSIS — D5700 Hb-SS disease with crisis, unspecified: Secondary | ICD-10-CM | POA: Diagnosis not present

## 2014-05-02 DIAGNOSIS — Z79899 Other long term (current) drug therapy: Secondary | ICD-10-CM | POA: Diagnosis not present

## 2014-05-02 DIAGNOSIS — D7289 Other specified disorders of white blood cells: Secondary | ICD-10-CM | POA: Diagnosis not present

## 2014-05-07 ENCOUNTER — Telehealth: Payer: Self-pay | Admitting: Internal Medicine

## 2014-05-07 DIAGNOSIS — D571 Sickle-cell disease without crisis: Secondary | ICD-10-CM

## 2014-05-08 MED ORDER — OXYCODONE HCL 20 MG PO TABS: 20.0000 mg | ORAL_TABLET | ORAL | Status: DC | PRN

## 2014-05-08 NOTE — Telephone Encounter (Signed)
Meds ordered this encounter  Medications  . Oxycodone HCl 20 MG TABS    Sig: Take 1 tablet (20 mg total) by mouth every 4 (four) hours as needed.    Dispense:  90 tablet    Refill:  0    Order Specific Question:  Supervising Provider    Answer:  Liston Alba A [[id number]]  Jakyiah Briones M, FNP  We will decrease weekly dosage by 10% each week to titrate dose down. Will notify patient in 1 week by phone.  Reviewed [place] Substance Reporting system prior to reorder  Dorena Dew, FNP

## 2014-05-16 ENCOUNTER — Telehealth: Payer: Self-pay | Admitting: Internal Medicine

## 2014-05-16 DIAGNOSIS — D571 Sickle-cell disease without crisis: Secondary | ICD-10-CM

## 2014-05-16 NOTE — Telephone Encounter (Signed)
Refill request for Oxycodone 20mg . LOV 04/25/2014 with [place]. Please advise. Thanks!

## 2014-05-21 MED ORDER — OXYCODONE HCL 20 MG PO TABS: 20.0000 mg | ORAL_TABLET | ORAL | Status: DC | PRN

## 2014-05-21 NOTE — Telephone Encounter (Signed)
Prescription re-written for Oxycodone 20 mg #90 tabs. NCCSRS checked and no unusual activity noted.  Pt has a visit upcoming with NP Cammie Sickle on 06/01/2014. At that time a discussion and initiation of weaning narcotics should occur as patient has agreed to wean narcotics 10% every 1-2 weeks as opposed to the abrupt decrease by 75% which ocured when he sought care at the pain clinic and went into withdrawal.

## 2014-05-30 ENCOUNTER — Telehealth: Payer: Self-pay | Admitting: Internal Medicine

## 2014-05-30 DIAGNOSIS — G894 Chronic pain syndrome: Secondary | ICD-10-CM

## 2014-05-30 DIAGNOSIS — D571 Sickle-cell disease without crisis: Secondary | ICD-10-CM

## 2014-05-30 MED ORDER — OXYCODONE HCL 15 MG PO TABS: 15.0000 mg | ORAL_TABLET | ORAL | Status: DC | PRN

## 2014-05-30 NOTE — Telephone Encounter (Signed)
Refill request for oxycodone 20mg . LOV 04/25/2014

## 2014-05-31 NOTE — Telephone Encounter (Signed)
Meds ordered this encounter  Medications  . oxyCODONE (ROXICODONE) 15 MG immediate release tablet    Sig: Take 1 tablet (15 mg total) by mouth every 4 (four) hours as needed for pain.    Dispense:  90 tablet    Refill:  0    Order Specific Question:  Supervising Provider    Answer:  MATTHEWS, MICHELLE A [[id number]]  We are tapering dose, I have reduced dosage by 10%. Reviewed [place] Substance Reporting system prior to reorder   Dorena Dew, FNP

## 2014-06-01 ENCOUNTER — Ambulatory Visit: Payer: Medicare Other | Admitting: Family Medicine

## 2014-06-08 ENCOUNTER — Telehealth: Payer: Self-pay | Admitting: Internal Medicine

## 2014-06-08 NOTE — Telephone Encounter (Signed)
Refill request for oxycodone 15mg  LOV. 04/25/2014

## 2014-06-12 ENCOUNTER — Ambulatory Visit (INDEPENDENT_AMBULATORY_CARE_PROVIDER_SITE_OTHER): Payer: Medicare Other | Admitting: Family Medicine

## 2014-06-12 VITALS — BP 124/74 | HR 72 | Temp 98.2°F | Resp 16 | Ht 68.0 in | Wt 149.0 lb

## 2014-06-12 DIAGNOSIS — G894 Chronic pain syndrome: Secondary | ICD-10-CM

## 2014-06-12 DIAGNOSIS — F17200 Nicotine dependence, unspecified, uncomplicated: Secondary | ICD-10-CM | POA: Diagnosis not present

## 2014-06-12 DIAGNOSIS — L209 Atopic dermatitis, unspecified: Secondary | ICD-10-CM | POA: Diagnosis not present

## 2014-06-12 DIAGNOSIS — E559 Vitamin D deficiency, unspecified: Secondary | ICD-10-CM

## 2014-06-12 DIAGNOSIS — D571 Sickle-cell disease without crisis: Secondary | ICD-10-CM | POA: Diagnosis not present

## 2014-06-12 MED ORDER — OXYCODONE HCL 15 MG PO TABS: 15.0000 mg | ORAL_TABLET | ORAL | Status: DC | PRN

## 2014-06-12 MED ORDER — TRIAMCINOLONE ACETONIDE 0.1 % EX CREA: 1.0000 "application " | TOPICAL_CREAM | Freq: Three times a day (TID) | CUTANEOUS | Status: DC

## 2014-06-12 NOTE — Progress Notes (Signed)
Subjective:    Patient ID: Alejandro Soto, male    DOB: Aug 29, 1985, 28 y.o.   MRN: [id number]  HPI Mr. Vermeer, a patient with a history of sickle cell anemia, HbSS presents for 3 months follow up. Mr. Lusk has been titrating down on opiate medications. Patient is no longer utilizing long acting medication and is currently taking Oxycodone 15 mg every four hours as needed for severe pain. He reports that he is taking Hydrea and folic acid consistently. Mr. Bartling is currently have 6/10 pain primarily to lower back and upper extremities described as constant and aching. He states that pain has been increased for the past 2 days. He attributes sickle cell pain to increased stress. Mr. Fredin father passed away 2 weeks ago and he has been under a considerable amount of family stress. Marland Kitchen He maintains that he last had Oxycodone around 6 am with minimal relief.   Mr. Acklin is also complaining of a rash to hands and elbows. He states that rash started 2 weeks ago. He describes rash and rough and itching. He reports a history of eczema, but he has not had a flare in years. He has not attempted any OTC interventions to alleviate symptoms.   Past Medical History  Diagnosis Date  . Sickle cell anemia   . Pneumonia    Review of Systems  Constitutional: Negative.   HENT: Positive for sore throat.   Eyes: Negative.   Respiratory: Negative.   Cardiovascular: Negative.   Gastrointestinal: Negative.   Endocrine: Negative.   Genitourinary: Negative.   Musculoskeletal: Positive for myalgias and back pain.  Skin: Positive for rash (Hands and elbows).  Allergic/Immunologic: Negative.   Neurological: Negative.   Hematological: Negative.   Psychiatric/Behavioral: Negative.        He reports that he has felt sad and lost since his father died. He denies suicidal or homicidal ideations.        Objective:   Physical Exam  Constitutional: He is oriented to person, place, and time. He appears well-developed and  well-nourished.  HENT:  Head: Normocephalic and atraumatic.  Right Ear: External ear normal.  Left Ear: External ear normal.  Mouth/Throat: Oropharynx is clear and moist.  Eyes: Conjunctivae are normal. Pupils are equal, round, and reactive to light.  Neck: Normal range of motion. Neck supple.  Cardiovascular: Normal rate, regular rhythm, normal heart sounds and intact distal pulses.   Pulmonary/Chest: Effort normal and breath sounds normal.  Abdominal: Soft. Bowel sounds are normal.  Musculoskeletal: Normal range of motion.  Neurological: He is alert and oriented to person, place, and time. He has normal reflexes.  Skin: Skin is warm and dry. Rash noted. Rash is maculopapular (to hands bilaterally).     Psychiatric: He has a normal mood and affect. His behavior is normal. Judgment and thought content normal.      BP 124/74 mmHg  Pulse 72  Temp(Src) 98.2 F (36.8 C) (Oral)  Resp 16  Ht 5\' 8"  (1.727 m)  Wt 149 lb (67.586 kg)  BMI 22.66 kg/m2    Assessment & Plan:  1. Hb-SS disease without crisis  Sickle cell disease - Continue Hydrea 500 mg twice daily. We discussed the need for good hydration, monitoring of hydration status, avoidance of heat, cold, stress, and infection triggers. He reports that he has been taking medication consistently.  We discussed the risks and benefits of Hydrea, including bone marrow suppression, the possibility of GI upset, skin ulcers, hair thinning, and teratogenicity. The  patient was reminded of the need to seek medical attention of any symptoms of bleeding, anemia, or infection. Continue folic acid 1 mg daily to prevent aplastic bone marrow crises.    Pulmonary evaluation - Patient denies severe recurrent wheezes, shortness of breath with exercise, or persistent cough. If these symptoms develop, pulmonary function tests with spirometry will be ordered, and if abnormal, plan on referral to Pulmonology for further evaluation.  Cardiac - Routine  screening for pulmonary hypertension is not recommended.  Eye - High risk of proliferative retinopathy. Annual eye exam with retinal exam recommended to patient. He states that he had an eye exam last April.    Immunization status -  Yearly influenza vaccination is recommended, as well as being up to date with Meningococcal and Pneumococcal vaccines. He is up to date with vaccinations.   Acute and chronic painful episodes - We have agreed to titrate Mr. Hevener's medications down. He is no longer taking and long acting medication. He is currently on Oxycodone 15 mg every 4 hours as needed. He is currently experiencing 7/10 pain to lower back. We will not titrate down further today. We discussed that he is to receive his Schedule II prescriptions only from [place]. She is also aware that her prescription history is available to [place] online through the [place]. Controlled substance agreement signed previously. We reminded Mr. Rabanal that all patients receiving Schedule II narcotics must be seen for follow up every three months. We reviewed the terms of our pain agreement, including the need to keep medicines in a safe locked location away from children or pets, and the need to report excess sedation or constipation, measures to avoid constipation, and policies related to early refills and stolen prescriptions. According to the [place] Chronic Pain Initiative program, we have reviewed details related to analgesia, adverse effects, aberrant behaviors.  Vitamin D deficiency - Mr. Volkert is not taking vitamin D. I will check vitamin D level today.    The above recommendations are taken from the NIH Evidence-Based Management of Sickle Cell Disease: Expert Panel Report, 20149.   - CBC with Differential Vitamin D 2. Chronic pain syndrome Mr. Alomar is experiencing 7/10 pain to lower back. Advised to take Oxycodone 30 mg this evening, increase hydration to 64 ounces of water, and take Ibuprofen 600 mg to prevent sickle cell  crisis. He expressed understanding.   - oxyCODONE (ROXICODONE) 15 MG immediate release tablet; Take 1 tablet (15 mg total) by mouth every 4 (four) hours as needed for pain.  Dispense: 95 tablet; Refill: 0  3. Atopic dermatitis Mr. Denomme has a history of eczema. He states that it started several weeks ago. He reports that he has not had a flare in several years.  - triamcinolone cream (KENALOG) 0.1 %; Apply 1 application topically 3 (three) times daily.  Dispense: 80 g; Refill: 1  4. Vitamin D deficiency There is not a recent vitamin D level on file.  Will check vitamin D level  5. Tobacco dependence Mr. Batson remains at [employer] stage. Smoking cessation instruction/counseling given:  counseled patient on the dangers of tobacco use, advised patient to stop smoking, and reviewed strategies to maximize success    Evett Kassa M, FNP  RTC: 3 months

## 2014-06-12 NOTE — Patient Instructions (Addendum)
Sickle Cell Anemia, Adult Sickle cell anemia is a condition in which red blood cells have an abnormal "sickle" shape. This abnormal shape shortens the cells' life span, which results in a lower than normal concentration of red blood cells in the blood. The sickle shape also causes the cells to clump together and block free blood flow through the blood vessels. As a result, the tissues and organs of the body do not receive enough oxygen. Sickle cell anemia causes organ damage and pain and increases the risk of infection. CAUSES  Sickle cell anemia is a genetic disorder. Those who receive two copies of the gene have the condition, and those who receive one copy have the trait. RISK FACTORS The sickle cell gene is most common in people whose families originated in [place]. Other areas of the globe where sickle cell trait occurs include the Mediterranean, [place] and [place], the [place], and the [place].  SIGNS AND SYMPTOMS  Pain, especially in the extremities, back, chest, or abdomen (common). The pain may start suddenly or may develop following an illness, especially if there is dehydration. Pain can also occur due to overexertion or exposure to extreme temperature changes.  Frequent severe bacterial infections, especially certain types of pneumonia and meningitis.  Pain and swelling in the hands and feet.  Decreased activity.   Loss of appetite.   Change in behavior.  Headaches.  Seizures.  Shortness of breath or difficulty breathing.  Vision changes.  Skin ulcers. Those with the trait may not have symptoms or they may have mild symptoms.  DIAGNOSIS  Sickle cell anemia is diagnosed with blood tests that demonstrate the genetic trait. It is often diagnosed during the newborn period, due to mandatory testing nationwide. A variety of blood tests, X-rays, CT scans, MRI scans, ultrasounds, and lung function tests may also be done to monitor the condition. TREATMENT  Sickle  cell anemia may be treated with:  Medicines. You may be given pain medicines, antibiotic medicines (to treat and prevent infections) or medicines to increase the production of certain types of hemoglobin.  Fluids.  Oxygen.  Blood transfusions. HOME CARE INSTRUCTIONS   Drink enough fluid to keep your urine clear or pale yellow. Increase your fluid intake in hot weather and during exercise.  Do not smoke. Smoking lowers oxygen levels in the blood.   Only take over-the-counter or prescription medicines for pain, fever, or discomfort as directed by your health care provider.  Take antibiotics as directed by your health care provider. Make sure you finish them it even if you start to feel better.   Take supplements as directed by your health care provider.   Consider wearing a medical alert bracelet. This tells anyone caring for you in an emergency of your condition.   When traveling, keep your medical information, health care provider's names, and the medicines you take with you at all times.   If you develop a fever, do not take medicines to reduce the fever right away. This could cover up a problem that is developing. Notify your health care provider.  Keep all follow-up appointments with your health care provider. Sickle cell anemia requires regular medical care. SEEK MEDICAL CARE IF: You have a fever. SEEK IMMEDIATE MEDICAL CARE IF:   You feel dizzy or faint.   You have new abdominal pain, especially on the left side near the stomach area.   You develop a persistent, often uncomfortable and painful penile erection (priapism). If this is not treated immediately it   will lead to impotence.   You have numbness your arms or legs or you have a hard time moving them.   You have a hard time with speech.   You have a fever or persistent symptoms for more than 2-3 days.   You have a fever and your symptoms suddenly get worse.   You have signs or symptoms of infection.  These include:   Chills.   Abnormal tiredness (lethargy).   Irritability.   Poor eating.   Vomiting.   You develop pain that is not helped with medicine.   You develop shortness of breath.  You have pain in your chest.   You are coughing up pus-like or bloody sputum.   You develop a stiff neck.  Your feet or hands swell or have pain.  Your abdomen appears bloated.  You develop joint pain. MAKE SURE YOU:  Understand these instructions. Document Released: 09/16/2005 Document Revised: 10/23/2013 Document Reviewed: 01/18/2013 [facility] Patient Information 2015 [place], [place]. This information is not intended to replace advice given to you by your health care provider. Make sure you discuss any questions you have with your health care provider. Eczema Eczema, also called atopic dermatitis, is a skin disorder that causes inflammation of the skin. It causes a red rash and dry, scaly skin. The skin becomes very itchy. Eczema is generally worse during the cooler winter months and often improves with the warmth of summer. Eczema usually starts showing signs in infancy. Some children outgrow eczema, but it may last through adulthood.  CAUSES  The exact cause of eczema is not known, but it appears to run in families. People with eczema often have a family history of eczema, allergies, asthma, or hay fever. Eczema is not contagious. Flare-ups of the condition may be caused by:   Contact with something you are sensitive or allergic to.   Stress. SIGNS AND SYMPTOMS  Dry, scaly skin.   Red, itchy rash.   Itchiness. This may occur before the skin rash and may be very intense.  DIAGNOSIS  The diagnosis of eczema is usually made based on symptoms and medical history. TREATMENT  Eczema cannot be cured, but symptoms usually can be controlled with treatment and other strategies. A treatment plan might include:  Controlling the itching and scratching.   Use  over-the-counter antihistamines as directed for itching. This is especially useful at night when the itching tends to be worse.   Use over-the-counter steroid creams as directed for itching.   Avoid scratching. Scratching makes the rash and itching worse. It may also result in a skin infection (impetigo) due to a break in the skin caused by scratching.   Keeping the skin well moisturized with creams every day. This will seal in moisture and help prevent dryness. Lotions that contain alcohol and water should be avoided because they can dry the skin.   Limiting exposure to things that you are sensitive or allergic to (allergens).   Recognizing situations that cause stress.   Developing a plan to manage stress.  HOME CARE INSTRUCTIONS   Only take over-the-counter or prescription medicines as directed by your health care provider.   Do not use anything on the skin without checking with your health care provider.   Keep baths or showers short (5 minutes) in warm (not hot) water. Use mild cleansers for bathing. These should be unscented. You may add nonperfumed bath oil to the bath water. It is best to avoid soap and bubble bath.   Immediately after  a bath or shower, when the skin is still damp, apply a moisturizing ointment to the entire body. This ointment should be a petroleum ointment. This will seal in moisture and help prevent dryness. The thicker the ointment, the better. These should be unscented.   Keep fingernails cut short. Children with eczema may need to wear soft gloves or mittens at night after applying an ointment.   Dress in clothes made of cotton or cotton blends. Dress lightly, because heat increases itching.   A child with eczema should stay away from anyone with fever blisters or cold sores. The virus that causes fever blisters (herpes simplex) can cause a serious skin infection in children with eczema. SEEK MEDICAL CARE IF:   Your itching interferes with  sleep.   Your rash gets worse or is not better within 1 week after starting treatment.   You see pus or soft yellow scabs in the rash area.   You have a fever.   You have a rash flare-up after contact with someone who has fever blisters.  Document Released: 06/05/2000 Document Revised: 03/29/2013 Document Reviewed: 01/09/2013 [facility] Patient Information 2015 [place], [place]. This information is not intended to replace advice given to you by your health care provider. Make sure you discuss any questions you have with your health care provider.

## 2014-06-13 ENCOUNTER — Encounter: Payer: Self-pay | Admitting: Family Medicine

## 2014-06-13 LAB — CBC WITH DIFFERENTIAL/PLATELET
BASOS PCT: 0 % (ref 0–1)
Basophils Absolute: 0 10*3/uL (ref 0.0–0.1)
EOS ABS: 0.4 10*3/uL (ref 0.0–0.7)
Eosinophils Relative: 3 % (ref 0–5)
HEMATOCRIT: 30.7 % — AB (ref 39.0–52.0)
Hemoglobin: 10.2 g/dL — ABNORMAL LOW (ref 13.0–17.0)
LYMPHS ABS: 4 10*3/uL (ref 0.7–4.0)
Lymphocytes Relative: 33 % (ref 12–46)
MCH: 30.2 pg (ref 26.0–34.0)
MCHC: 33.2 g/dL (ref 30.0–36.0)
MCV: 90.8 fL (ref 78.0–100.0)
MONOS PCT: 7 % (ref 3–12)
MPV: 8.8 fL — AB (ref 9.4–12.4)
Monocytes Absolute: 0.8 10*3/uL (ref 0.1–1.0)
NEUTROS PCT: 57 % (ref 43–77)
Neutro Abs: 6.9 10*3/uL (ref 1.7–7.7)
Platelets: 507 10*3/uL — ABNORMAL HIGH (ref 150–400)
RBC: 3.38 MIL/uL — ABNORMAL LOW (ref 4.22–5.81)
RDW: 18 % — AB (ref 11.5–15.5)
WBC: 12.1 10*3/uL — ABNORMAL HIGH (ref 4.0–10.5)

## 2014-06-21 ENCOUNTER — Telehealth: Payer: Self-pay | Admitting: Internal Medicine

## 2014-06-21 DIAGNOSIS — G894 Chronic pain syndrome: Secondary | ICD-10-CM

## 2014-06-21 MED ORDER — OXYCODONE HCL 15 MG PO TABS: 15.0000 mg | ORAL_TABLET | Freq: Four times a day (QID) | ORAL | Status: DC | PRN

## 2014-06-21 NOTE — Telephone Encounter (Signed)
Refill request for Oxycodone 15mg . LOV 06/12/2014. Please advise. Thanks!

## 2014-06-21 NOTE — Telephone Encounter (Signed)
Meds ordered this encounter  Medications  . oxyCODONE (ROXICODONE) 15 MG immediate release tablet    Sig: Take 1 tablet (15 mg total) by mouth every 6 (six) hours as needed for pain.    Dispense:  60 tablet    Refill:  0    Rx not to be filled prior to 06/27/2014    Order Specific Question:  Supervising Provider    Answer:  Liston Alba A [[id number]]  Will continue to titrate drug dose to lesson morphine equivalent.  Reviewed  Substance Reporting system prior to reorder   Dorena Dew, FNP

## 2014-06-29 ENCOUNTER — Telehealth: Payer: Self-pay | Admitting: Internal Medicine

## 2014-06-29 NOTE — Telephone Encounter (Signed)
Called patient to schedule follow up appointment. Patient stated he had death in his family and would call back next week.

## 2014-07-03 ENCOUNTER — Telehealth: Payer: Self-pay | Admitting: Hematology

## 2014-07-03 ENCOUNTER — Non-Acute Institutional Stay (HOSPITAL_COMMUNITY)
Admission: AD | Admit: 2014-07-03 | Discharge: 2014-07-03 | Disposition: A | Discharge status: Home / Self Care | Payer: Medicare Other | Source: Ambulatory Visit | Attending: Internal Medicine | Admitting: Internal Medicine

## 2014-07-03 ENCOUNTER — Ambulatory Visit (INDEPENDENT_AMBULATORY_CARE_PROVIDER_SITE_OTHER): Payer: Medicare Other | Admitting: Family Medicine

## 2014-07-03 ENCOUNTER — Encounter: Payer: Self-pay | Admitting: Family Medicine

## 2014-07-03 ENCOUNTER — Encounter (HOSPITAL_COMMUNITY): Payer: Self-pay | Admitting: Hematology

## 2014-07-03 VITALS — BP 123/81 | HR 87 | Temp 98.5°F | Resp 16 | Ht 67.0 in | Wt 144.0 lb

## 2014-07-03 DIAGNOSIS — D5700 Hb-SS disease with crisis, unspecified: Secondary | ICD-10-CM

## 2014-07-03 DIAGNOSIS — J029 Acute pharyngitis, unspecified: Secondary | ICD-10-CM | POA: Diagnosis not present

## 2014-07-03 DIAGNOSIS — L723 Sebaceous cyst: Secondary | ICD-10-CM | POA: Insufficient documentation

## 2014-07-03 DIAGNOSIS — L209 Atopic dermatitis, unspecified: Secondary | ICD-10-CM

## 2014-07-03 DIAGNOSIS — F17200 Nicotine dependence, unspecified, uncomplicated: Secondary | ICD-10-CM

## 2014-07-03 LAB — COMPREHENSIVE METABOLIC PANEL
ALBUMIN: 5 g/dL (ref 3.5–5.2)
ALT: 31 U/L (ref 0–53)
ANION GAP: 7 (ref 5–15)
AST: 45 U/L — ABNORMAL HIGH (ref 0–37)
Alkaline Phosphatase: 60 U/L (ref 39–117)
BILIRUBIN TOTAL: 1.7 mg/dL — AB (ref 0.3–1.2)
BUN: 8 mg/dL (ref 6–23)
CALCIUM: 9.1 mg/dL (ref 8.4–10.5)
CHLORIDE: 106 meq/L (ref 96–112)
CO2: 22 mmol/L (ref 19–32)
CREATININE: 0.54 mg/dL (ref 0.50–1.35)
Glucose, Bld: 99 mg/dL (ref 70–99)
Potassium: 3.5 mmol/L (ref 3.5–5.1)
Sodium: 135 mmol/L (ref 135–145)
Total Protein: 8.3 g/dL (ref 6.0–8.3)

## 2014-07-03 LAB — CBC WITH DIFFERENTIAL/PLATELET
BASOS PCT: 0 % (ref 0–1)
Basophils Absolute: 0 10*3/uL (ref 0.0–0.1)
EOS PCT: 1 % (ref 0–5)
Eosinophils Absolute: 0.2 10*3/uL (ref 0.0–0.7)
HEMATOCRIT: 27 % — AB (ref 39.0–52.0)
HEMOGLOBIN: 9.4 g/dL — AB (ref 13.0–17.0)
Lymphocytes Relative: 13 % (ref 12–46)
Lymphs Abs: 2.8 10*3/uL (ref 0.7–4.0)
MCH: 29.9 pg (ref 26.0–34.0)
MCHC: 34.8 g/dL (ref 30.0–36.0)
MCV: 86 fL (ref 78.0–100.0)
Monocytes Absolute: 2.1 10*3/uL — ABNORMAL HIGH (ref 0.1–1.0)
Monocytes Relative: 10 % (ref 3–12)
NEUTROS ABS: 16.3 10*3/uL — AB (ref 1.7–7.7)
Neutrophils Relative %: 76 % (ref 43–77)
Platelets: 262 10*3/uL (ref 150–400)
RBC: 3.14 MIL/uL — ABNORMAL LOW (ref 4.22–5.81)
RDW: 17.1 % — ABNORMAL HIGH (ref 11.5–15.5)
WBC: 21.4 10*3/uL — ABNORMAL HIGH (ref 4.0–10.5)

## 2014-07-03 LAB — POCT RAPID STREP A (OFFICE): RAPID STREP A SCREEN: NEGATIVE

## 2014-07-03 MED ORDER — HYDROMORPHONE HCL 2 MG/ML IJ SOLN
2.5000 mg | Freq: Once | INTRAMUSCULAR | Status: AC
  Administered 2014-07-03: 2.5 mg via INTRAVENOUS
  Filled 2014-07-03: qty 2

## 2014-07-03 MED ORDER — HYDROXYUREA 500 MG PO CAPS: 1500.0000 mg | ORAL_CAPSULE | Freq: Every day | ORAL | Status: DC

## 2014-07-03 MED ORDER — DIPHENHYDRAMINE HCL 12.5 MG/5ML PO ELIX: 12.5000 mg | ORAL_SOLUTION | Freq: Four times a day (QID) | ORAL | Status: DC | PRN

## 2014-07-03 MED ORDER — HYDROMORPHONE HCL 2 MG/ML IJ SOLN
1.6000 mg | Freq: Once | INTRAMUSCULAR | Status: AC
  Administered 2014-07-03: 1.6 mg via INTRAVENOUS
  Filled 2014-07-03: qty 1

## 2014-07-03 MED ORDER — SODIUM CHLORIDE 0.9 % IV SOLN
12.5000 mg | Freq: Four times a day (QID) | INTRAVENOUS | Status: DC | PRN
  Filled 2014-07-03: qty 0.25

## 2014-07-03 MED ORDER — HYDROMORPHONE 2 MG/ML HIGH CONCENTRATION IV PCA SOLN
INTRAVENOUS | Status: DC
  Administered 2014-07-03: 15:00:00 via INTRAVENOUS
  Administered 2014-07-03: 1.2 mg via INTRAVENOUS
  Administered 2014-07-03: 10.2 mg via INTRAVENOUS
  Filled 2014-07-03: qty 25

## 2014-07-03 MED ORDER — NALOXONE HCL 0.4 MG/ML IJ SOLN: 0.4000 mg | INTRAMUSCULAR | Status: DC | PRN

## 2014-07-03 MED ORDER — HYDROMORPHONE HCL 2 MG/ML IJ SOLN
2.0000 mg | Freq: Once | INTRAMUSCULAR | Status: AC
  Administered 2014-07-03: 2 mg via INTRAVENOUS
  Filled 2014-07-03: qty 1

## 2014-07-03 MED ORDER — TRIAMCINOLONE ACETONIDE 0.025 % EX OINT: 1.0000 "application " | TOPICAL_OINTMENT | Freq: Two times a day (BID) | CUTANEOUS | Status: DC

## 2014-07-03 MED ORDER — SODIUM CHLORIDE 0.9 % IJ SOLN: 9.0000 mL | INTRAMUSCULAR | Status: DC | PRN

## 2014-07-03 MED ORDER — AZITHROMYCIN 250 MG PO TABS: ORAL_TABLET | ORAL | Status: DC

## 2014-07-03 MED ORDER — OXYCODONE HCL 5 MG PO TABS
15.0000 mg | ORAL_TABLET | Freq: Once | ORAL | Status: AC
  Administered 2014-07-03: 15 mg via ORAL
  Filled 2014-07-03: qty 3

## 2014-07-03 MED ORDER — KETOROLAC TROMETHAMINE 30 MG/ML IJ SOLN
30.0000 mg | Freq: Once | INTRAMUSCULAR | Status: AC
  Administered 2014-07-03: 30 mg via INTRAVENOUS
  Filled 2014-07-03: qty 1

## 2014-07-03 MED ORDER — DEXTROSE-NACL 5-0.45 % IV SOLN
INTRAVENOUS | Status: DC
  Administered 2014-07-03: 13:00:00 via INTRAVENOUS

## 2014-07-03 NOTE — Discharge Summary (Signed)
Physician Discharge Summary  Alejandro Soto YOV:[id number] DOB: September 02, 1985 DOA: 07/03/2014  PCP: MATTHEWS,MICHELLE A., MD  Admit date: 07/03/2014 Discharge date: 07/03/2014  Discharge Diagnoses:  Active Problems:   Hb-SS disease with crisis   Discharge Condition: Stable  Disposition: Home  Diet: Regular Wt Readings from Last 3 Encounters:  07/03/14 144 lb (65.318 kg)  07/03/14 144 lb (65.318 kg)  06/12/14 149 lb (67.586 kg)     Hospital Course:  Patient was transitioned to the day infusion center from primary care for extended observation. Alejandro Soto was experiencing 8/10 pain to lumbar sacral area and lower extremities. I started hypotonic IV fluids for cellular rehydration. Laboratory values were reviewed.  IV Toradol 30 mg times 1 for inflammation. He was given IV dilaudid per weight based rapid re-dosing protocol. Pain intensity remained greater than 7/10. He was started on dilaudid PCA. Alejandro Soto used a total of 11.4 mg of Dilaudid with 28 demands and 19 deliveries. He was given oral pain medication 40 minutes prior to discharge. Pain intensity was 5/10 prior to discharge. Patient is alert, oriented, and ambulatory. He is functional and states that he can manage at home on current medication regimen. He is to follow up in office as scheduled.   Discharge Exam:  Filed Vitals:   07/03/14 1731  BP: 117/60  Pulse: 65  Temp: 97.8 F (36.6 C)  Resp: 15   Filed Vitals:   07/03/14 1240 07/03/14 1535 07/03/14 1640 07/03/14 1731  BP: 120/71 108/62 99/55 117/60  Pulse: 67 61 62 65  Temp: 98.2 F (36.8 C) 98.3 F (36.8 C) 97.8 F (36.6 C) 97.8 F (36.6 C)  TempSrc: Oral Oral Oral Oral  Resp: 18 15 14 15   Height: 5\' 7"  (1.702 m)     Weight: 144 lb (65.318 kg)     SpO2: 100% 97% 97% 97%     General: Alert, awake, oriented x3, in no acute distress.  HEENT: [place]/AT PEERL, EOMI, Mild erythema to oropharynx Neck: Trachea midline,  no masses, no thyromegal,y no JVD, no carotid  bruit  Heart: Regular rate and rhythm, without murmurs, rubs, gallops, PMI non-displaced, no heaves or thrills on palpation.  Lungs: Clear to auscultation, no wheezing or rhonchi noted. No increased vocal fremitus resonant to percussion  Abdomen: Soft, nontender, nondistended, positive bowel sounds, no masses no hepatosplenomegaly noted..  Neuro: No focal neurological deficits noted cranial nerves II through XII grossly intact. DTRs 2+ bilaterally upper and lower extremities. Strength 5 out of 5 in bilateral upper and lower extremities. Musculoskeletal: No warm swelling or erythema around joints, no spinal tenderness noted. Psychiatric: Patient alert and oriented x3, good insight and cognition, good recent to remote recall. Lymph node survey: No cervical axillary or inguinal lymphadenopathy noted.   Discharge Instructions     Medication List    ASK your doctor about these medications        azithromycin 250 MG tablet  Commonly known as:  ZITHROMAX  Take 500 mg Day 1; Take 250 mg on days 2-5     folic acid 1 MG tablet  Commonly known as:  FOLVITE  Take 1 mg by mouth daily.     gabapentin 300 MG capsule  Commonly known as:  NEURONTIN  Take 1 capsule (300 mg total) by mouth 2 (two) times daily.     hydroxyurea 500 MG capsule  Commonly known as:  HYDREA  Take 3 capsules (1,500 mg total) by mouth daily. May take with food to minimize GI side  effects.     ibuprofen 600 MG tablet  Commonly known as:  ADVIL,MOTRIN  Take 1 tablet (600 mg total) by mouth every 6 (six) hours as needed for mild pain or moderate pain.     multivitamin tablet  Take 2 tablets by mouth daily.     oxyCODONE 15 MG immediate release tablet  Commonly known as:  ROXICODONE  Take 1 tablet (15 mg total) by mouth every 6 (six) hours as needed for pain.     triamcinolone 0.025 % ointment  Commonly known as:  KENALOG  Apply 1 application topically 2 (two) times daily.          The results of significant  diagnostics from this hospitalization (including imaging, microbiology, ancillary and laboratory) are listed below for reference.    Significant Diagnostic Studies: No results found.  Microbiology: No results found for this or any previous visit (from the past 240 hour(s)).   Labs: Basic Metabolic Panel:  Recent Labs Lab 07/03/14 1242  NA 135  K 3.5  CL 106  CO2 22  GLUCOSE 99  BUN 8  CREATININE 0.54  CALCIUM 9.1   Liver Function Tests:  Recent Labs Lab 07/03/14 1242  AST 45*  ALT 31  ALKPHOS 60  BILITOT 1.7*  PROT 8.3  ALBUMIN 5.0   No results for input(s): LIPASE, AMYLASE in the last 168 hours. No results for input(s): AMMONIA in the last 168 hours. CBC:  Recent Labs Lab 07/03/14 1242  WBC 21.4*  NEUTROABS 16.3*  HGB 9.4*  HCT 27.0*  MCV 86.0  PLT 262   Cardiac Enzymes: No results for input(s): CKTOTAL, CKMB, CKMBINDEX, TROPONINI in the last 168 hours. BNP: Invalid input(s): POCBNP CBG: No results for input(s): GLUCAP in the last 168 hours. Ferritin: No results for input(s): FERRITIN in the last 168 hours.  Time coordinating discharge:  Greater than 35 minutes  Signed:  Sem Mccaughey M  07/03/2014, 6:26 PM

## 2014-07-03 NOTE — Progress Notes (Addendum)
Subjective:    Patient ID: Alejandro Soto, male    DOB: [birth year], 29 y.o.   MRN: [id number]  HPI [name], a 29 year old male with a history of sickle cell anemia presents with increased pain to lower back and lower extremities over the past 3 days. He states that his grandmother passed away 4 days ago and he has been under a considerable amount of stress. He states that pain intensity is 8/10 described as constant and throbbing. He maintains that he last had Oxycodone 10 mg around 8 am with minimal relief.   [name] is also complaining of sore throat pain for 1 day. He is not complaining of associated symptoms. He denies headache, fever, chest pains, cough or congestion.  Marland Kitchen He is not drinking much. He denies a recent close exposure to someone with proven streptococcal pharyngitis.    Past Medical History  Diagnosis Date  . Sickle cell anemia   . Pneumonia     Review of Systems  Constitutional: Negative.   HENT: Positive for sore throat.   Eyes: Negative.   Respiratory: Negative.   Gastrointestinal: Negative.   Endocrine: Negative.   Genitourinary: Negative.   Musculoskeletal: Positive for myalgias (bilateral lower extremity pain) and back pain.  Skin: Positive for wound (left neck abscess. ).  Allergic/Immunologic: Negative.   Neurological: Negative.   Hematological: Negative.   Psychiatric/Behavioral: Negative.  Negative for suicidal ideas and sleep disturbance.       Situational depression. Patient's father died 39 month ago and his grandmother died 4 days ago.        Objective:   Physical Exam  Constitutional: He is oriented to person, place, and time. He appears well-developed and well-nourished.  HENT:  Head: Normocephalic and atraumatic.  Right Ear: External ear normal.  Left Ear: External ear normal.  Mouth/Throat: Posterior oropharyngeal erythema (mild erythema) present. No oropharyngeal exudate or posterior oropharyngeal edema.  Eyes: Conjunctivae and  EOM are normal. Pupils are equal, round, and reactive to light.  Neck: Normal range of motion. Neck supple.  Cardiovascular: Normal rate, regular rhythm, normal heart sounds and intact distal pulses.   Pulmonary/Chest: Effort normal and breath sounds normal.  Abdominal: Soft. Bowel sounds are normal.  Musculoskeletal: Normal range of motion.  Neurological: He is alert and oriented to person, place, and time.  Skin: Skin is warm and dry. Lesion (left neck ) and rash noted. Rash is maculopapular (bilateral hands, rough to palpation, with erythema, and cracking).  Left neck 1 cmX 0.5 cm hyperpigmented cyst, non tender to palpation  Psychiatric: He has a normal mood and affect. His behavior is normal. Judgment and thought content normal.         BP 123/81 mmHg  Pulse 87  Temp(Src) 98.5 F (36.9 C) (Oral)  Resp 16  Ht 5\' 7"  (1.702 m)  Wt 144 lb (65.318 kg)  BMI 22.55 kg/m2  Assessment & Plan:  1. Sickle cell anemia with pain Patient is complaining of 8/10 pain uncontrolled by home medication regimen. I will transition to day hospital for extended observation and initiate IV fluids and pain management.  [name] is also to continue Hydrea 500 mg twice daily. We discussed the need for good hydration, monitoring of hydration status, avoidance of heat, cold, stress, and infection triggers. We discussed the risks and benefits of Hydrea, including bone marrow suppression, the possibility of GI upset, skin ulcers, hair thinning, and teratogenicity. The patient was reminded of the need to seek  medical attention of any symptoms of bleeding, anemia, or infection. Continue folic acid 1 mg daily to prevent aplastic bone marrow crises.     - hydroxyurea (HYDREA) 500 MG capsule; Take 3 capsules (1,500 mg total) by mouth daily. May take with food to minimize GI side effects.  Dispense: 90 capsule; Refill: 3   2. Sore throat Mild erythema to oropharynx with minimal  exudate. Rapid strep test was  negative. [name] was advised to increase rest and fluid intake. I also recommend warm liquids such as broth and herbal tea. In addition he was advised to refrain from smoking. I will also start Azithromycin for 5 days.  - POCT rapid strep A  3. Atopic dermatitis Eczema to hands bilaterally. Skin, is currently itchy, red, swollen, and cracked. I will .  (KENALOG) 0.025 % ointment; Apply 1 application topically 2 (two) times daily.  Dispense: 30 g; Refill: 2  4. Sebaceous cyst  Cyst is painless on palpation. Place warm compresses to left neck.  5. Tobacco dependence Patient is currently in the pre-contemplative stage. Smoking cessation instruction/counseling given:  counseled patient on the dangers of tobacco use, advised patient to stop smoking, and reviewed strategies to maximize success   Dorena Dew, FNP

## 2014-07-03 NOTE — H&P (Signed)
[facility] History and Physical   Date: 07/03/2014  Patient name: Alejandro Soto Medical record number: [id number] Date of birth: 1986/05/15 Age: 29 y.o. Gender: male PCP: MATTHEWS,MICHELLE A., MD  Attending physician: Leana Gamer, MD  Chief Complaint: Lower extremity and back pain  History of Present Illness: Alejandro Soto, a 29 year old male with a history of sickle cell anemia, HbSS presents with increased pain to bilateral lower extremities and lower back for 3 days. Patient was transitioned from the primary care office to day hospital for acute pain management.  Patient maintains that pain intensity increase 3-4 days ago. He attributes current pain crisis to increased stress related to his grandmother's death 4 days ago. His pain intensity is described as 8/10, constant, and sharp. He last had Oxycodone 15 mg around 8 am with minimal relief. He currently denies headache, fatigue, chest pain, nausea, vomiting, or diarrhea.   Meds: Prescriptions prior to admission  Medication Sig Dispense Refill Last Dose  . folic acid (FOLVITE) 1 MG tablet Take 1 mg by mouth daily.   Taking  . gabapentin (NEURONTIN) 300 MG capsule Take 1 capsule (300 mg total) by mouth 2 (two) times daily. 60 capsule 2 Taking  . hydroxyurea (HYDREA) 500 MG capsule Take 3 capsules (1,500 mg total) by mouth daily. May take with food to minimize GI side effects. 90 capsule 3   . ibuprofen (ADVIL,MOTRIN) 600 MG tablet Take 1 tablet (600 mg total) by mouth every 6 (six) hours as needed for mild pain or moderate pain. 30 tablet 1 Taking  . Multiple Vitamin (MULTIVITAMIN) tablet Take 2 tablets by mouth daily.    Taking  . oxyCODONE (ROXICODONE) 15 MG immediate release tablet Take 1 tablet (15 mg total) by mouth every 6 (six) hours as needed for pain. 60 tablet 0 Taking  . triamcinolone (KENALOG) 0.025 % ointment Apply 1 application topically 2 (two) times daily. 30 g 2      Allergies: Amoxicillin Past Medical History  Diagnosis Date  . Sickle cell anemia   . Pneumonia    Past Surgical History  Procedure Laterality Date  . No past surgeries     Family History  Problem Relation Age of Onset  . Diabetes Father   . Cancer Father 39    Prostate  . Asthma Brother   . Cancer Maternal Grandmother     colon    History   Social History  . Marital Status: Single    Spouse Name: N/A    Number of Children: N/A  . Years of Education: N/A   Occupational History  . Not on file.   Social History Main Topics  . Smoking status: Current Some Day Smoker -- 0.50 packs/day for 5 years    Types: Cigarettes  . Smokeless tobacco: Never Used  . Alcohol Use: No  . Drug Use: No  . Sexual Activity: Yes   Other Topics Concern  . Not on file   Social History Narrative    Review of Systems: Constitutional: negative Eyes: negative Ears, nose, mouth, throat, and face: positive for sore throat Respiratory: negative Cardiovascular: negative Gastrointestinal: negative Genitourinary:negative Integument/breast: negative Hematologic/lymphatic: negative Musculoskeletal:positive for back pain and myalgias Neurological: negative Behavioral/Psych: negative Endocrine: negative Allergic/Immunologic: negative  Physical Exam: There were no vitals taken for this visit. There were no vitals taken for this visit.  General Appearance:    Alert, cooperative, distress, appears stated age  Head:    Normocephalic, without obvious abnormality,  atraumatic  Eyes:    PERRL, conjunctiva/corneas clear, EOM's intact, fundi    benign, both eyes       Ears:    Normal TM's and external ear canals, both ears  Nose:   Nares normal, septum midline, mucosa normal, no drainage    or sinus tenderness  Throat:   Lips, mucosa, and tongue normal; teeth and gums normal. Mild erythema.   Neck:   Supple, symmetrical, trachea midline, no adenopathy;       thyroid:  No  enlargement/tenderness/nodules; no carotid   bruit or JVD  Back:     Symmetric, no curvature, ROM normal, no CVA tenderness  Lungs:     Clear to auscultation bilaterally, respirations unlabored  Chest wall:    No tenderness or deformity  Heart:    Regular rate and rhythm, S1 and S2 normal, no murmur, rub   or gallop  Abdomen:     Soft, non-tender, bowel sounds active all four quadrants,    no masses, no organomegaly  Genitalia:    Normal male without lesion, discharge or tenderness  Rectal:    Normal tone, normal prostate, no masses or tenderness;   guaiac negative stool  Extremities:   Extremities normal, atraumatic, no cyanosis or edema  Pulses:   2+ and symmetric all extremities  Skin:   Skin color, texture, turgor normal, no rashes or lesions  Lymph nodes:   Cervical, supraclavicular, and axillary nodes normal  Neurologic:   CNII-XII intact. Normal strength, sensation and reflexes      throughout    Lab results: No results found for this or any previous visit (from the past 24 hour(s)).  Imaging results:  No results found.   Assessment & Plan: Sickle cell anemia with pain:  Start hypotonic IV fluid for cellular rehydration Start IV Toradol for inflammation I will check CBC w/differential, CMP Start IV dilaudid per weight based rapid re-dosing for pain control times 3 doses. If pain intensity remains elevated. I will start Dilaudid PCA per weight based protocol.    Alejandro Soto M 07/03/2014, 12:29 PM

## 2014-07-03 NOTE — Addendum Note (Signed)
Addended by: Dorena Dew on: 07/03/2014 05:36 PM   Modules accepted: Orders

## 2014-07-03 NOTE — Telephone Encounter (Signed)
Called patient back and advised per Dr. Zigmund Daniel he needs to be seen in the doctor's office first.  Explained to patient that we could work him in at 30.  Patient states it is going to take him at least 25mins to get here.  I explained to come as soon as possible.  Patient verbalizes understanding.

## 2014-07-03 NOTE — Discharge Instructions (Signed)
Sickle Cell Anemia, Adult °Sickle cell anemia is a condition in which red blood cells have an abnormal "sickle" shape. This abnormal shape shortens the cells' life span, which results in a lower than normal concentration of red blood cells in the blood. The sickle shape also causes the cells to clump together and block free blood flow through the blood vessels. As a result, the tissues and organs of the body do not receive enough oxygen. Sickle cell anemia causes organ damage and pain and increases the risk of infection. °CAUSES  °Sickle cell anemia is a genetic disorder. Those who receive two copies of the gene have the condition, and those who receive one copy have the trait. °RISK FACTORS °The sickle cell gene is most common in people whose families originated in [place]. Other areas of the globe where sickle cell trait occurs include the Mediterranean, [place] and [place], the [place], and the [place].  °SIGNS AND SYMPTOMS °· Pain, especially in the extremities, back, chest, or abdomen (common). The pain may start suddenly or may develop following an illness, especially if there is dehydration. Pain can also occur due to overexertion or exposure to extreme temperature changes. °· Frequent severe bacterial infections, especially certain types of pneumonia and meningitis. °· Pain and swelling in the hands and feet. °· Decreased activity.   °· Loss of appetite.   °· Change in behavior. °· Headaches. °· Seizures. °· Shortness of breath or difficulty breathing. °· Vision changes. °· Skin ulcers. °Those with the trait may not have symptoms or they may have mild symptoms.  °DIAGNOSIS  °Sickle cell anemia is diagnosed with blood tests that demonstrate the genetic trait. It is often diagnosed during the newborn period, due to mandatory testing nationwide. A variety of blood tests, X-rays, CT scans, MRI scans, ultrasounds, and lung function tests may also be done to monitor the condition. °TREATMENT  °Sickle  cell anemia may be treated with: °· Medicines. You may be given pain medicines, antibiotic medicines (to treat and prevent infections) or medicines to increase the production of certain types of hemoglobin. °· Fluids. °· Oxygen. °· Blood transfusions. °HOME CARE INSTRUCTIONS  °· Drink enough fluid to keep your urine clear or pale yellow. Increase your fluid intake in hot weather and during exercise. °· Do not smoke. Smoking lowers oxygen levels in the blood.   °· Only take over-the-counter or prescription medicines for pain, fever, or discomfort as directed by your health care provider. °· Take antibiotics as directed by your health care provider. Make sure you finish them it even if you start to feel better.   °· Take supplements as directed by your health care provider.   °· Consider wearing a medical alert bracelet. This tells anyone caring for you in an emergency of your condition.   °· When traveling, keep your medical information, health care provider's names, and the medicines you take with you at all times.   °· If you develop a fever, do not take medicines to reduce the fever right away. This could cover up a problem that is developing. Notify your health care provider. °· Keep all follow-up appointments with your health care provider. Sickle cell anemia requires regular medical care. °SEEK MEDICAL CARE IF: ° You have a fever. °SEEK IMMEDIATE MEDICAL CARE IF:  °· You feel dizzy or faint.   °· You have new abdominal pain, especially on the left side near the stomach area.   °· You develop a persistent, often uncomfortable and painful penile erection (priapism). If this is not treated immediately it   will lead to impotence.   °· You have numbness your arms or legs or you have a hard time moving them.   °· You have a hard time with speech.   °· You have a fever or persistent symptoms for more than 2-3 days.   °· You have a fever and your symptoms suddenly get worse.   °· You have signs or symptoms of infection.  These include:   °¨ Chills.   °¨ Abnormal tiredness (lethargy).   °¨ Irritability.   °¨ Poor eating.   °¨ Vomiting.   °· You develop pain that is not helped with medicine.   °· You develop shortness of breath. °· You have pain in your chest.   °· You are coughing up pus-like or bloody sputum.   °· You develop a stiff neck. °· Your feet or hands swell or have pain. °· Your abdomen appears bloated. °· You develop joint pain. °MAKE SURE YOU: °· Understand these instructions. °Document Released: 09/16/2005 Document Revised: 10/23/2013 Document Reviewed: 01/18/2013 °Exit[employer]® Patient Information ©2015 [employer]. This information is not intended to replace advice given to you by your health care provider. Make sure you discuss any questions you have with your health care provider. ° °

## 2014-07-03 NOTE — Telephone Encounter (Signed)
Patient C/O pain pain to legs/back that he is rating 7/10 on pain scale.  Patient also C/O throat pain and hot/cold flashes x2 days.  Patient denies chest pain, abdominal pain.  One episode of nausea without emesis.  I explained I would notify the physician and give him a call back.  Patient verbalizes understanding.

## 2014-07-03 NOTE — Progress Notes (Signed)
Patient ID: Alejandro Soto, male   DOB: 1986/03/07, 29 y.o.   MRN: [id number] Discharge instructions given to patient, belongings returned, IV removed without difficulty.  Patient states pain is currently 4/10 on pain scale.  Family member her to transport patient home.

## 2014-07-03 NOTE — Patient Instructions (Signed)

## 2014-07-04 ENCOUNTER — Telehealth: Payer: Self-pay | Admitting: Internal Medicine

## 2014-07-04 DIAGNOSIS — G894 Chronic pain syndrome: Secondary | ICD-10-CM

## 2014-07-04 NOTE — Telephone Encounter (Signed)
Refill request for Oxycodone 15mg  LOV 07/03/2014. Please advise. Thanks!

## 2014-07-08 DIAGNOSIS — R05 Cough: Secondary | ICD-10-CM | POA: Diagnosis not present

## 2014-07-08 DIAGNOSIS — Z79899 Other long term (current) drug therapy: Secondary | ICD-10-CM | POA: Diagnosis not present

## 2014-07-08 DIAGNOSIS — J029 Acute pharyngitis, unspecified: Secondary | ICD-10-CM | POA: Diagnosis not present

## 2014-07-08 DIAGNOSIS — Z881 Allergy status to other antibiotic agents status: Secondary | ICD-10-CM | POA: Diagnosis not present

## 2014-07-08 DIAGNOSIS — R0981 Nasal congestion: Secondary | ICD-10-CM | POA: Diagnosis not present

## 2014-07-08 DIAGNOSIS — Z87891 Personal history of nicotine dependence: Secondary | ICD-10-CM | POA: Diagnosis not present

## 2014-07-09 MED ORDER — OXYCODONE HCL 15 MG PO TABS: 15.0000 mg | ORAL_TABLET | Freq: Four times a day (QID) | ORAL | Status: DC | PRN

## 2014-07-09 NOTE — Telephone Encounter (Signed)
Meds ordered this encounter  Medications  . oxyCODONE (ROXICODONE) 15 MG immediate release tablet    Sig: Take 1 tablet (15 mg total) by mouth every 6 (six) hours as needed for pain.    Dispense:  60 tablet    Refill:  0    Rx not to be filled prior to 07/12/2014    Order Specific Question:  Supervising Provider    Answer:  Liston Alba A [[id number]]  Reviewed [facility]stance Reporting system prior to reorder Dorena Dew, FNP

## 2014-07-14 ENCOUNTER — Inpatient Hospital Stay (HOSPITAL_COMMUNITY)
Admission: EM | Admit: 2014-07-14 | Discharge: 2014-07-19 | DRG: 812 | Disposition: A | Discharge status: Home / Self Care | Payer: Medicare Other | Attending: Internal Medicine | Admitting: Internal Medicine

## 2014-07-14 ENCOUNTER — Encounter (HOSPITAL_COMMUNITY): Payer: Self-pay | Admitting: Emergency Medicine

## 2014-07-14 DIAGNOSIS — R52 Pain, unspecified: Secondary | ICD-10-CM | POA: Diagnosis not present

## 2014-07-14 DIAGNOSIS — D5700 Hb-SS disease with crisis, unspecified: Secondary | ICD-10-CM | POA: Diagnosis not present

## 2014-07-14 DIAGNOSIS — E876 Hypokalemia: Secondary | ICD-10-CM | POA: Diagnosis not present

## 2014-07-14 DIAGNOSIS — D72829 Elevated white blood cell count, unspecified: Secondary | ICD-10-CM | POA: Diagnosis present

## 2014-07-14 DIAGNOSIS — F17210 Nicotine dependence, cigarettes, uncomplicated: Secondary | ICD-10-CM | POA: Diagnosis present

## 2014-07-14 DIAGNOSIS — D649 Anemia, unspecified: Secondary | ICD-10-CM | POA: Diagnosis present

## 2014-07-14 DIAGNOSIS — Z881 Allergy status to other antibiotic agents status: Secondary | ICD-10-CM

## 2014-07-14 DIAGNOSIS — D75839 Thrombocytosis, unspecified: Secondary | ICD-10-CM | POA: Diagnosis present

## 2014-07-14 DIAGNOSIS — D473 Essential (hemorrhagic) thrombocythemia: Secondary | ICD-10-CM | POA: Diagnosis not present

## 2014-07-14 DIAGNOSIS — D582 Other hemoglobinopathies: Secondary | ICD-10-CM | POA: Diagnosis not present

## 2014-07-14 DIAGNOSIS — M549 Dorsalgia, unspecified: Secondary | ICD-10-CM | POA: Diagnosis not present

## 2014-07-14 LAB — BASIC METABOLIC PANEL
Anion gap: 6 (ref 5–15)
BUN: 7 mg/dL (ref 6–23)
CO2: 24 mmol/L (ref 19–32)
Calcium: 9.2 mg/dL (ref 8.4–10.5)
Chloride: 108 mmol/L (ref 96–112)
Creatinine, Ser: 0.72 mg/dL (ref 0.50–1.35)
GFR calc Af Amer: >90 mL/min (ref >90)
GFR calc non Af Amer: >90 mL/min (ref >90)
Glucose, Bld: 92 mg/dL (ref 70–99)
Potassium: 3.6 mmol/L (ref 3.5–5.1)
Sodium: 138 mmol/L (ref 135–145)

## 2014-07-14 LAB — CBC WITH DIFFERENTIAL/PLATELET
Basophils Absolute: 0 10*3/uL (ref 0.0–0.1)
Basophils Relative: 0 % (ref 0–1)
Eosinophils Absolute: 0.2 10*3/uL (ref 0.0–0.7)
Eosinophils Relative: 1 % (ref 0–5)
HCT: 28.9 % — ABNORMAL LOW (ref 39.0–52.0)
Hemoglobin: 10.1 g/dL — ABNORMAL LOW (ref 13.0–17.0)
Lymphocytes Relative: 24 % (ref 12–46)
Lymphs Abs: 2.6 10*3/uL (ref 0.7–4.0)
MCH: 31.4 pg (ref 26.0–34.0)
MCHC: 34.9 g/dL (ref 30.0–36.0)
MCV: 89.8 fL (ref 78.0–100.0)
Monocytes Absolute: 0.8 10*3/uL (ref 0.1–1.0)
Monocytes Relative: 8 % (ref 3–12)
Neutro Abs: 7.3 10*3/uL (ref 1.7–7.7)
Neutrophils Relative %: 67 % (ref 43–77)
Platelets: 516 10*3/uL — ABNORMAL HIGH (ref 150–400)
RBC: 3.22 MIL/uL — ABNORMAL LOW (ref 4.22–5.81)
RDW: 20.1 % — ABNORMAL HIGH (ref 11.5–15.5)
WBC: 10.9 10*3/uL — ABNORMAL HIGH (ref 4.0–10.5)

## 2014-07-14 LAB — RETICULOCYTES
RBC.: 3.22 MIL/uL — ABNORMAL LOW (ref 4.22–5.81)
Retic Count, Absolute: 318.8 10*3/uL — ABNORMAL HIGH (ref 19.0–186.0)
Retic Ct Pct: 9.9 % — ABNORMAL HIGH (ref 0.4–3.1)

## 2014-07-14 MED ORDER — ALUM & MAG HYDROXIDE-SIMETH 200-200-20 MG/5ML PO SUSP: 30.0000 mL | Freq: Four times a day (QID) | ORAL | Status: DC | PRN

## 2014-07-14 MED ORDER — ACETAMINOPHEN 325 MG PO TABS: 650.0000 mg | ORAL_TABLET | Freq: Four times a day (QID) | ORAL | Status: DC | PRN

## 2014-07-14 MED ORDER — FOLIC ACID 1 MG PO TABS
1.0000 mg | ORAL_TABLET | Freq: Every day | ORAL | Status: DC
  Administered 2014-07-15 – 2014-07-19 (×5): 1 mg via ORAL
  Filled 2014-07-14 (×5): qty 1

## 2014-07-14 MED ORDER — ONDANSETRON HCL 4 MG/2ML IJ SOLN: 4.0000 mg | Freq: Four times a day (QID) | INTRAMUSCULAR | Status: DC | PRN

## 2014-07-14 MED ORDER — GABAPENTIN 300 MG PO CAPS
300.0000 mg | ORAL_CAPSULE | Freq: Two times a day (BID) | ORAL | Status: DC
  Administered 2014-07-14 – 2014-07-19 (×10): 300 mg via ORAL
  Filled 2014-07-14 (×11): qty 1

## 2014-07-14 MED ORDER — KCL IN DEXTROSE-NACL 20-5-0.45 MEQ/L-%-% IV SOLN: INTRAVENOUS | Status: DC

## 2014-07-14 MED ORDER — PNEUMOCOCCAL VAC POLYVALENT 25 MCG/0.5ML IJ INJ
0.5000 mL | INJECTION | INTRAMUSCULAR | Status: AC
Start: 2014-07-15 — End: 2014-07-15
  Administered 2014-07-15: 0.5 mL via INTRAMUSCULAR
  Filled 2014-07-14 (×2): qty 0.5

## 2014-07-14 MED ORDER — HYDROMORPHONE 0.3 MG/ML IV SOLN
INTRAVENOUS | Status: DC
  Administered 2014-07-14: 22:00:00 via INTRAVENOUS
  Administered 2014-07-15: 3.39 mg via INTRAVENOUS
  Administered 2014-07-15: 25 mL via INTRAVENOUS
  Administered 2014-07-15: 7.79 mg via INTRAVENOUS
  Administered 2014-07-15: 0.3 mg via INTRAVENOUS
  Filled 2014-07-14 (×3): qty 25

## 2014-07-14 MED ORDER — HYDROXYUREA 500 MG PO CAPS
1500.0000 mg | ORAL_CAPSULE | Freq: Every day | ORAL | Status: DC
  Administered 2014-07-14 – 2014-07-19 (×6): 1500 mg via ORAL
  Filled 2014-07-14 (×6): qty 3

## 2014-07-14 MED ORDER — ENOXAPARIN SODIUM 40 MG/0.4ML ~~LOC~~ SOLN
40.0000 mg | SUBCUTANEOUS | Status: DC
  Administered 2014-07-14 – 2014-07-18 (×5): 40 mg via SUBCUTANEOUS
  Filled 2014-07-14 (×6): qty 0.4

## 2014-07-14 MED ORDER — ADULT MULTIVITAMIN W/MINERALS CH
2.0000 | ORAL_TABLET | Freq: Every day | ORAL | Status: DC
  Administered 2014-07-14 – 2014-07-19 (×6): 2 via ORAL
  Filled 2014-07-14 (×6): qty 2

## 2014-07-14 MED ORDER — HYDROMORPHONE HCL 2 MG/ML IJ SOLN
2.0000 mg | Freq: Once | INTRAMUSCULAR | Status: AC
  Administered 2014-07-14: 2 mg via INTRAVENOUS
  Filled 2014-07-14: qty 1

## 2014-07-14 MED ORDER — DIPHENHYDRAMINE HCL 12.5 MG/5ML PO ELIX: 12.5000 mg | ORAL_SOLUTION | Freq: Four times a day (QID) | ORAL | Status: DC | PRN

## 2014-07-14 MED ORDER — IBUPROFEN 200 MG PO TABS
600.0000 mg | ORAL_TABLET | Freq: Four times a day (QID) | ORAL | Status: DC | PRN
  Administered 2014-07-15: 600 mg via ORAL
  Filled 2014-07-14: qty 3

## 2014-07-14 MED ORDER — ONDANSETRON HCL 4 MG PO TABS: 4.0000 mg | ORAL_TABLET | Freq: Four times a day (QID) | ORAL | Status: DC | PRN

## 2014-07-14 MED ORDER — SODIUM CHLORIDE 0.9 % IJ SOLN: 9.0000 mL | INTRAMUSCULAR | Status: DC | PRN

## 2014-07-14 MED ORDER — DIPHENHYDRAMINE HCL 50 MG/ML IJ SOLN: 12.5000 mg | Freq: Four times a day (QID) | INTRAMUSCULAR | Status: DC | PRN

## 2014-07-14 MED ORDER — SODIUM CHLORIDE 0.45 % IV SOLN
INTRAVENOUS | Status: DC
  Administered 2014-07-14 – 2014-07-15 (×2): via INTRAVENOUS
  Administered 2014-07-15: 1000 mL via INTRAVENOUS
  Administered 2014-07-16 – 2014-07-19 (×5): via INTRAVENOUS

## 2014-07-14 MED ORDER — ACETAMINOPHEN 650 MG RE SUPP: 650.0000 mg | Freq: Four times a day (QID) | RECTAL | Status: DC | PRN

## 2014-07-14 MED ORDER — NALOXONE HCL 0.4 MG/ML IJ SOLN: 0.4000 mg | INTRAMUSCULAR | Status: DC | PRN

## 2014-07-14 MED ORDER — HYDROMORPHONE HCL 2 MG/ML IJ SOLN
3.0000 mg | Freq: Once | INTRAMUSCULAR | Status: AC
Start: 2014-07-14 — End: 2014-07-14
  Administered 2014-07-14: 3 mg via INTRAVENOUS
  Filled 2014-07-14: qty 2

## 2014-07-14 MED ORDER — HYDROMORPHONE HCL 2 MG/ML IJ SOLN
2.0000 mg | Freq: Once | INTRAMUSCULAR | Status: AC
Start: 2014-07-14 — End: 2014-07-14
  Administered 2014-07-14: 2 mg via INTRAVENOUS
  Filled 2014-07-14: qty 1

## 2014-07-14 MED ORDER — DEXTROSE-NACL 5-0.45 % IV SOLN
INTRAVENOUS | Status: DC
  Administered 2014-07-14: 16:00:00 via INTRAVENOUS
  Administered 2014-07-14: 1000 mL via INTRAVENOUS

## 2014-07-14 MED ORDER — KETOROLAC TROMETHAMINE 30 MG/ML IJ SOLN
30.0000 mg | Freq: Once | INTRAMUSCULAR | Status: AC
  Administered 2014-07-14: 30 mg via INTRAVENOUS
  Filled 2014-07-14: qty 1

## 2014-07-14 NOTE — ED Notes (Signed)
Per pt, states having sickle cell pain going on 4 days

## 2014-07-14 NOTE — ED Notes (Signed)
Awake. Verbally responsive. A/O x4. Resp even and unlabored. No audible adventitious breath sounds noted. ABC's intact. Watching TV.

## 2014-07-14 NOTE — Plan of Care (Signed)
Problem: Phase I Progression Outcomes Goal: Pain controlled with appropriate interventions Outcome: Not Progressing Pt was started on Dilaudid pca, not completely controlling pain yet. Will continue to monitor.

## 2014-07-14 NOTE — ED Notes (Signed)
MD at bedside. 

## 2014-07-14 NOTE — H&P (Signed)
[facility] Admission History and Physical       Alejandro Soto GXQ:[id number] DOB: [birth year] DOA: 07/14/2014  Referring physician: EDP PCP: MATTHEWS,MICHELLE A., MD  Specialists:   Chief Complaint: Back Pain and Muscle  HPI: Alejandro Soto is a 29 y.o. male with Sickle Cell SS disease who presents to the ED with complaints of 10/10 Back pain and muscle aches x 4 days without relief from his home pain Rx.  He denies any fevers or chills , chest pain and SOB.  He was medicated in the ED without relief, his pain came down to a 7/10.   He was referred for Medical admission.       Review of Systems:  Constitutional: No Weight Loss, No Weight Gain, Night Sweats, Fevers, Chills, Dizziness, +Myalgias, Fatigue, or Generalized Weakness HEENT: No Headaches, Difficulty Swallowing,Tooth/Dental Problems,Sore Throat,  No Sneezing, Rhinitis, Ear Ache, Nasal Congestion, or Post Nasal Drip,  Cardio-vascular:  No Chest pain, Orthopnea, PND, Edema in Lower Extremities, Anasarca, Dizziness, Palpitations  Resp: No Dyspnea, No DOE, No Productive Cough, No Non-Productive Cough, No Hemoptysis, No Wheezing.    GI: No Heartburn, Indigestion, Abdominal Pain, Nausea, Vomiting, Diarrhea, Hematemesis, Hematochezia, Melena, Change in Bowel Habits,  Loss of Appetite  GU: No Dysuria, Change in Color of Urine, No Urgency or Frequency, No Flank pain.  Musculoskeletal: No Joint Pain or Swelling, No Decreased Range of Motion, +Back Pain.  Neurologic: No Syncope, No Seizures, Muscle Weakness, Paresthesia, Vision Disturbance or Loss, No Diplopia, No Vertigo, No Difficulty Walking,  Skin: No Rash or Lesions. Psych: No Change in Mood or Affect, No Depression or Anxiety, No Memory loss, No Confusion, or Hallucinations   Past Medical History  Diagnosis Date  . Sickle cell anemia   . Pneumonia      Past Surgical History  Procedure Laterality Date  . No past surgeries        Prior to Admission medications     Medication Sig Start Date End Date Taking? Authorizing Provider  folic acid (FOLVITE) 1 MG tablet Take 1 mg by mouth daily.   Yes Historical Provider, MD  gabapentin (NEURONTIN) 300 MG capsule Take 1 capsule (300 mg total) by mouth 2 (two) times daily. 09/07/13  Yes Leana Gamer, MD  hydroxyurea (HYDREA) 500 MG capsule Take 3 capsules (1,500 mg total) by mouth daily. May take with food to minimize GI side effects. 07/03/14  Yes Dorena Dew, FNP  ibuprofen (ADVIL,MOTRIN) 600 MG tablet Take 1 tablet (600 mg total) by mouth every 6 (six) hours as needed for mild pain or moderate pain. 12/15/13  Yes Leana Gamer, MD  Multiple Vitamin (MULTIVITAMIN) tablet Take 2 tablets by mouth daily.    Yes Historical Provider, MD  oxyCODONE (ROXICODONE) 15 MG immediate release tablet Take 1 tablet (15 mg total) by mouth every 6 (six) hours as needed for pain. 07/09/14  Yes Dorena Dew, FNP  triamcinolone (KENALOG) 0.025 % ointment Apply 1 application topically 2 (two) times daily. 07/03/14  Yes Dorena Dew, FNP  azithromycin (ZITHROMAX) 250 MG tablet Take 500 mg Day 1; Take 250 mg on days 2-5 Patient not taking: Reported on 07/14/2014 07/03/14   Dorena Dew, FNP     Allergies  Allergen Reactions  . Amoxicillin Diarrhea     Social History:  reports that he has been smoking Cigarettes.  He has a 2.5 pack-year smoking history. He has never used smokeless tobacco. He reports that he does not drink alcohol or  use illicit drugs.     Family History  Problem Relation Age of Onset  . Diabetes Father   . Cancer Father 91    Prostate  . Asthma Brother   . Cancer Maternal Grandmother     colon        Physical Exam:  GEN:  Pleasant Well Nourished and Well Developed  29 y.o. African American male  examined  and in no acute distress; cooperative with exam Filed Vitals:   07/14/14 1444 07/14/14 1824 07/14/14 1954  BP: 137/87 124/80 116/59  Pulse: 80 69 71  Temp: 98.2 F (36.8 C)  98.1 F (36.7 C)   TempSrc: Oral Oral   Resp: 16 16 18   SpO2: 100% 100% 100%   Blood pressure 116/59, pulse 71, temperature 98.1 F (36.7 C), temperature source Oral, resp. rate 18, SpO2 100 %. PSYCH: He is alert and oriented x4; does not appear anxious does not appear depressed; affect is normal HEENT: Normocephalic and Atraumatic, Mucous membranes pink; PERRLA; EOM intact; Fundi:  Benign;  No scleral icterus, Nares: Patent, Oropharynx: Clear, Fair Dentition,    Neck:  FROM, No Cervical Lymphadenopathy nor Thyromegaly or Carotid Bruit; No JVD; Breasts:: Not examined CHEST WALL: No tenderness CHEST: Normal respiration, clear to auscultation bilaterally HEART: Regular rate and rhythm; no murmurs rubs or gallops BACK: No kyphosis or scoliosis; No CVA tenderness ABDOMEN: Positive Bowel Sounds, Soft Non-Tender; No Masses, No Organomegaly.    Rectal Exam: Not done EXTREMITIES: No Cyanosis, Clubbing, or Edema; No Ulcerations. Genitalia: not examined PULSES: 2+ and symmetric SKIN: Normal hydration no rash or ulceration CNS:  Alert and Oriented x 4, No Focal Deficits Vascular: pulses palpable throughout    Labs on Admission:  Basic Metabolic Panel:  Recent Labs Lab 07/14/14 1535  NA 138  K 3.6  CL 108  CO2 24  GLUCOSE 92  BUN 7  CREATININE 0.72  CALCIUM 9.2   Liver Function Tests: No results for input(s): AST, ALT, ALKPHOS, BILITOT, PROT, ALBUMIN in the last 168 hours. No results for input(s): LIPASE, AMYLASE in the last 168 hours. No results for input(s): AMMONIA in the last 168 hours. CBC:  Recent Labs Lab 07/14/14 1535  WBC 10.9*  NEUTROABS 7.3  HGB 10.1*  HCT 28.9*  MCV 89.8  PLT 516*   Cardiac Enzymes: No results for input(s): CKTOTAL, CKMB, CKMBINDEX, TROPONINI in the last 168 hours.  BNP (last 3 results) No results for input(s): PROBNP in the last 8760 hours. CBG: No results for input(s): GLUCAP in the last 168 hours.  Radiological Exams on  Admission: No results found.     Assessment/Plan:   29 y.o. male with  Principal Problem:   1.   Sickle cell crisis   PCA Dilaudid For Pain   IVFs   O2 PRN  Active Problems:   2.   Anemia- due to #1      3.  Thrombocytosis- Stress Rxn   Monitor Trend     4.   Leukocytosis- Stress Rxn   Monitor Trend        5.   DVT Prophylaxis    Lovenox          Code Status:     FULL CODE Family Communication:    No Family Present Disposition Plan:     Inpatient MED/Surg  Time spent: [address] [place] [address] [facility] Pager [phone]   If [place] Please Contact the Day Rounding Team MD for [facility]  If  7PM-7AM, Please Contact Night-Floor Coverage  [email or web address] Password [email or web address] 07/14/2014, 8:30 PM

## 2014-07-15 DIAGNOSIS — D5700 Hb-SS disease with crisis, unspecified: Secondary | ICD-10-CM | POA: Diagnosis not present

## 2014-07-15 LAB — BASIC METABOLIC PANEL
Anion gap: 4 — ABNORMAL LOW (ref 5–15)
BUN: 9 mg/dL (ref 6–23)
CO2: 26 mmol/L (ref 19–32)
Calcium: 8.9 mg/dL (ref 8.4–10.5)
Chloride: 109 mmol/L (ref 96–112)
Creatinine, Ser: 0.76 mg/dL (ref 0.50–1.35)
GFR calc Af Amer: >90 mL/min (ref >90)
Glucose, Bld: 88 mg/dL (ref 70–99)
Potassium: 3.7 mmol/L (ref 3.5–5.1)
Sodium: 139 mmol/L (ref 135–145)

## 2014-07-15 LAB — CBC
HCT: 23.9 % — ABNORMAL LOW (ref 39.0–52.0)
HEMOGLOBIN: 8.3 g/dL — AB (ref 13.0–17.0)
MCH: 31.2 pg (ref 26.0–34.0)
MCHC: 34.7 g/dL (ref 30.0–36.0)
MCV: 89.8 fL (ref 78.0–100.0)
PLATELETS: 412 10*3/uL — AB (ref 150–400)
RBC: 2.66 MIL/uL — AB (ref 4.22–5.81)
RDW: 20.4 % — ABNORMAL HIGH (ref 11.5–15.5)
WBC: 11.3 10*3/uL — ABNORMAL HIGH (ref 4.0–10.5)

## 2014-07-15 MED ORDER — HYDROCODONE-ACETAMINOPHEN 5-325 MG PO TABS
2.0000 | ORAL_TABLET | ORAL | Status: DC | PRN
  Administered 2014-07-15 – 2014-07-19 (×13): 2 via ORAL
  Filled 2014-07-15 (×13): qty 2

## 2014-07-15 MED ORDER — HYDROMORPHONE 2 MG/ML HIGH CONCENTRATION IV PCA SOLN
INTRAVENOUS | Status: DC
  Administered 2014-07-15: 15.6 mg via INTRAVENOUS
  Administered 2014-07-15: 7.79 mg via INTRAVENOUS
  Administered 2014-07-15: 16.59 mg via INTRAVENOUS
  Administered 2014-07-16: 7.8 mg via INTRAVENOUS
  Administered 2014-07-16: 05:00:00 via INTRAVENOUS
  Administered 2014-07-16: 12.6 mg via INTRAVENOUS
  Administered 2014-07-16: 21:00:00 via INTRAVENOUS
  Administered 2014-07-16: 15.6 mg via INTRAVENOUS
  Administered 2014-07-16: 10.99 mg via INTRAVENOUS
  Administered 2014-07-17: 15.6 mg via INTRAVENOUS
  Administered 2014-07-17: 12 mg via INTRAVENOUS
  Administered 2014-07-17: 9 mg via INTRAVENOUS
  Administered 2014-07-17: 7.8 mg via INTRAVENOUS
  Administered 2014-07-17: 11.4 mg via INTRAVENOUS
  Administered 2014-07-17: 15:00:00 via INTRAVENOUS
  Administered 2014-07-17: 9.6 mg via INTRAVENOUS
  Administered 2014-07-18: 13.8 mL via INTRAVENOUS
  Administered 2014-07-18: 7.2 mg via INTRAVENOUS
  Administered 2014-07-18: 15 mL via INTRAVENOUS
  Administered 2014-07-18: 10:00:00 via INTRAVENOUS
  Administered 2014-07-18: 5.4 mg via INTRAVENOUS
  Administered 2014-07-19: 14.4 mL via INTRAVENOUS
  Administered 2014-07-19: 01:00:00 via INTRAVENOUS
  Filled 2014-07-15 (×6): qty 25

## 2014-07-15 MED ORDER — HYDROMORPHONE HCL 1 MG/ML IJ SOLN
0.5000 mg | Freq: Once | INTRAMUSCULAR | Status: AC
  Administered 2014-07-15: 0.5 mg via INTRAVENOUS
  Filled 2014-07-15: qty 1

## 2014-07-15 NOTE — Progress Notes (Signed)
Subjective: A 29 year old gentleman with known history of sickle cell disease admitted with sickle cell painful crisis last night. Patient is currently on Dilaudid PCA the low dose. Pain is currently at 7 out of 10. No shortness of breath no cough. He has used 10.9 mg of the Dilaudid with 66 demands and 55 deliveries. Patient is asking for increasing his pain medication. He denied any fever or chills. He has not been out of bed yet this morning.  Objective: Vital signs in last 24 hours: Temp:  [98 F (36.7 C)-98.3 F (36.8 C)] 98 F (36.7 C) (01/24 0518) Pulse Rate:  [66-81] 74 (01/24 0518) Resp:  [15-20] 18 (01/24 0800) BP: (109-137)/(59-91) 109/67 mmHg (01/24 0518) SpO2:  [100 %] 100 % (01/24 0518) Weight:  [64.229 kg (141 lb 9.6 oz)] 64.229 kg (141 lb 9.6 oz) (01/23 2159) Weight change:  Last BM Date: 07/14/14  Intake/Output from previous day: 01/23 0701 - 01/24 0700 In: 400 [P.O.:400] Out: 475 [Urine:475] Intake/Output this shift: Total I/O In: 1548.7 [P.O.:457; I.V.:1091.7] Out: -   General appearance: alert, cooperative and no distress Eyes: conjunctivae/corneas clear. PERRL, EOM's intact. Fundi benign. Neck: no adenopathy, no carotid bruit, no JVD, supple, symmetrical, trachea midline and thyroid not enlarged, symmetric, no tenderness/mass/nodules Back: symmetric, no curvature. ROM normal. No CVA tenderness. Resp: clear to auscultation bilaterally Chest wall: no tenderness Cardio: regular rate and rhythm, S1, S2 normal, no murmur, click, rub or gallop GI: soft, non-tender; bowel sounds normal; no masses,  no organomegaly Extremities: extremities normal, atraumatic, no cyanosis or edema Pulses: 2+ and symmetric Skin: Skin color, texture, turgor normal. No rashes or lesions Neurologic: Grossly normal  Lab Results:  Recent Labs  07/14/14 1535 07/15/14 0440  WBC 10.9* 11.3*  HGB 10.1* 8.3*  HCT 28.9* 23.9*  PLT 516* 412*   BMET  Recent Labs  07/14/14 1535  07/15/14 0440  NA 138 139  K 3.6 3.7  CL 108 109  CO2 24 26  GLUCOSE 92 88  BUN 7 9  CREATININE 0.72 0.76  CALCIUM 9.2 8.9    Studies/Results: No results found.  Medications: I have reviewed the patient's current medications.  Assessment/Plan: A 29 year old gentleman admitted with sickle cell painful crisis.   #1 sickle cell painful crisis: Patient is currently on Dilaudid PCA. Also on Toradol. I will change patient's Dilaudid PCA to the high concentration. Monitor his use overnight and continue his IV fluids.   #2 sickle cell anemia: Patient's hemoglobin has dropped almost 2 g. It seems to be due to hemodilution. I'll follow closely and if it comes dropping we will check for hemolysis. At this point no evidence of significant hemolysis. Next  #3 leukocytosis: Due to VOC. Continue monitoring.  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 Monitor closely.  LOS: 1 day   Alejandro Soto,Alejandro Soto 07/15/2014, 10:52 AM

## 2014-07-16 LAB — CBC WITH DIFFERENTIAL/PLATELET
Band Neutrophils: 0 % (ref 0–10)
Basophils Absolute: 0 10*3/uL (ref 0.0–0.1)
Basophils Relative: 0 % (ref 0–1)
Blasts: 0 %
Eosinophils Absolute: 0.3 10*3/uL (ref 0.0–0.7)
Eosinophils Relative: 2 % (ref 0–5)
HCT: 24.6 % — ABNORMAL LOW (ref 39.0–52.0)
HEMOGLOBIN: 8.6 g/dL — AB (ref 13.0–17.0)
Lymphocytes Relative: 31 % (ref 12–46)
Lymphs Abs: 4 10*3/uL (ref 0.7–4.0)
MCH: 31.6 pg (ref 26.0–34.0)
MCHC: 35 g/dL (ref 30.0–36.0)
MCV: 90.4 fL (ref 78.0–100.0)
METAMYELOCYTES PCT: 0 %
MONO ABS: 0.6 10*3/uL (ref 0.1–1.0)
Monocytes Relative: 5 % (ref 3–12)
Myelocytes: 0 %
Neutro Abs: 8 10*3/uL — ABNORMAL HIGH (ref 1.7–7.7)
Neutrophils Relative %: 62 % (ref 43–77)
PROMYELOCYTES ABS: 0 %
Platelets: 407 10*3/uL — ABNORMAL HIGH (ref 150–400)
RBC: 2.72 MIL/uL — ABNORMAL LOW (ref 4.22–5.81)
RDW: 20.3 % — AB (ref 11.5–15.5)
WBC: 12.9 10*3/uL — AB (ref 4.0–10.5)
nRBC: 4 /100 WBC — ABNORMAL HIGH

## 2014-07-16 LAB — COMPREHENSIVE METABOLIC PANEL
ALT: 11 U/L (ref 0–53)
ANION GAP: 7 (ref 5–15)
AST: 23 U/L (ref 0–37)
Albumin: 3.7 g/dL (ref 3.5–5.2)
Alkaline Phosphatase: 49 U/L (ref 39–117)
BILIRUBIN TOTAL: 0.8 mg/dL (ref 0.3–1.2)
BUN: 5 mg/dL — AB (ref 6–23)
CALCIUM: 8.4 mg/dL (ref 8.4–10.5)
CHLORIDE: 105 mmol/L (ref 96–112)
CO2: 25 mmol/L (ref 19–32)
Creatinine, Ser: 0.61 mg/dL (ref 0.50–1.35)
Glucose, Bld: 74 mg/dL (ref 70–99)
Potassium: 3.4 mmol/L — ABNORMAL LOW (ref 3.5–5.1)
Sodium: 137 mmol/L (ref 135–145)
Total Protein: 6.1 g/dL (ref 6.0–8.3)

## 2014-07-16 MED ORDER — POTASSIUM CHLORIDE CRYS ER 20 MEQ PO TBCR
40.0000 meq | EXTENDED_RELEASE_TABLET | Freq: Once | ORAL | Status: AC
  Administered 2014-07-16: 40 meq via ORAL
  Filled 2014-07-16: qty 2

## 2014-07-16 NOTE — Progress Notes (Signed)
Subjective: Patient is feeling much better today. His pain is however at 7 out of 10. It is localized to his back and lower legs. He is better now on the high dose Dilaudid PCA which was started yesterday. He has used 23.4 mg of the Dilaudid with 39 demands and 39 deliveries. No fever, no shortness of breath, no nausea vomiting or diarrhea. Patient has not gotten out of bed yet. He has been able to eat and drink without any problem..  Objective: Vital signs in last 24 hours: Temp:  [97.9 F (36.6 C)-98.3 F (36.8 C)] 98.3 F (36.8 C) (01/25 0925) Pulse Rate:  [69-78] 78 (01/25 0925) Resp:  [12-18] 12 (01/25 0925) BP: (110-124)/(62-77) 124/73 mmHg (01/25 0925) SpO2:  [98 %-100 %] 99 % (01/25 0925) Weight:  [65.409 kg (144 lb 3.2 oz)] 65.409 kg (144 lb 3.2 oz) (01/25 0523) Weight change: 1.179 kg (2 lb 9.6 oz) Last BM Date: 07/14/14  Intake/Output from previous day: 01/24 0701 - 01/25 0700 In: 4345.3 [P.O.:2497; I.V.:1848.3] Out: 2660 [Urine:2660] Intake/Output this shift: Total I/O In: 1720 [P.O.:120; I.V.:1600] Out: 425 [Urine:425]  General appearance: alert, cooperative and no distress Eyes: conjunctivae/corneas clear. PERRL, EOM's intact. Fundi benign. Neck: no adenopathy, no carotid bruit, no JVD, supple, symmetrical, trachea midline and thyroid not enlarged, symmetric, no tenderness/mass/nodules Back: symmetric, no curvature. ROM normal. No CVA tenderness. Resp: clear to auscultation bilaterally Chest wall: no tenderness Cardio: regular rate and rhythm, S1, S2 normal, no murmur, click, rub or gallop GI: soft, non-tender; bowel sounds normal; no masses,  no organomegaly Extremities: extremities normal, atraumatic, no cyanosis or edema Pulses: 2+ and symmetric Skin: Skin color, texture, turgor normal. No rashes or lesions Neurologic: Grossly normal  Lab Results:  Recent Labs  07/15/14 0440 07/16/14 0437  WBC 11.3* 12.9*  HGB 8.3* 8.6*  HCT 23.9* 24.6*  PLT 412* 407*    BMET  Recent Labs  07/15/14 0440 07/16/14 0437  NA 139 137  K 3.7 3.4*  CL 109 105  CO2 26 25  GLUCOSE 88 74  BUN 9 5*  CREATININE 0.76 0.61  CALCIUM 8.9 8.4    Studies/Results: No results found.  Medications: I have reviewed the patient's current medications.  Assessment/Plan: A 29 year old gentleman admitted with sickle cell painful crisis.   #1 sickle cell painful crisis: Patient seems to be doing better. His new Dilaudid dose seems to work better. I will keep her on current regimen for another 24 hours. Mobilize the patient today. Continue IV fluids and Toradol.  #2 sickle cell anemia: Patient's hemoglobin is now stable. We'll keep monitoring for any drops. No active hemolysis at this point  #3 leukocytosis: Due to VOC. Continue monitoring.  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 Monitor closely.  #5 hypokalemia: We will replete his potassium and recheck in the morning.  LOS: 2 days   GARBA,LAWAL 07/16/2014, 10:35 AM

## 2014-07-17 LAB — COMPREHENSIVE METABOLIC PANEL
ALK PHOS: 46 U/L (ref 39–117)
ALT: 12 U/L (ref 0–53)
AST: 25 U/L (ref 0–37)
Albumin: 3.7 g/dL (ref 3.5–5.2)
Anion gap: 5 (ref 5–15)
BUN: <5 mg/dL — ABNORMAL LOW (ref 6–23)
CHLORIDE: 106 mmol/L (ref 96–112)
CO2: 27 mmol/L (ref 19–32)
CREATININE: 0.55 mg/dL (ref 0.50–1.35)
Calcium: 8.6 mg/dL (ref 8.4–10.5)
GFR calc non Af Amer: >90 mL/min (ref >90)
GLUCOSE: 90 mg/dL (ref 70–99)
Potassium: 3.9 mmol/L (ref 3.5–5.1)
Sodium: 138 mmol/L (ref 135–145)
Total Bilirubin: 1.2 mg/dL (ref 0.3–1.2)
Total Protein: 6 g/dL (ref 6.0–8.3)

## 2014-07-17 LAB — CBC WITH DIFFERENTIAL/PLATELET
Basophils Absolute: 0 10*3/uL (ref 0.0–0.1)
Basophils Relative: 0 % (ref 0–1)
Eosinophils Absolute: 0.4 10*3/uL (ref 0.0–0.7)
Eosinophils Relative: 3 % (ref 0–5)
HEMATOCRIT: 23.4 % — AB (ref 39.0–52.0)
Hemoglobin: 8.2 g/dL — ABNORMAL LOW (ref 13.0–17.0)
Lymphocytes Relative: 28 % (ref 12–46)
Lymphs Abs: 3.7 10*3/uL (ref 0.7–4.0)
MCH: 31.4 pg (ref 26.0–34.0)
MCHC: 35 g/dL (ref 30.0–36.0)
MCV: 89.7 fL (ref 78.0–100.0)
MONOS PCT: 4 % (ref 3–12)
Monocytes Absolute: 0.5 10*3/uL (ref 0.1–1.0)
NEUTROS PCT: 65 % (ref 43–77)
Neutro Abs: 8.5 10*3/uL — ABNORMAL HIGH (ref 1.7–7.7)
Platelets: 419 10*3/uL — ABNORMAL HIGH (ref 150–400)
RBC: 2.61 MIL/uL — ABNORMAL LOW (ref 4.22–5.81)
RDW: 19.8 % — ABNORMAL HIGH (ref 11.5–15.5)
WBC: 13.1 10*3/uL — ABNORMAL HIGH (ref 4.0–10.5)

## 2014-07-17 MED ORDER — POLYETHYLENE GLYCOL 3350 17 G PO PACK
17.0000 g | PACK | Freq: Every day | ORAL | Status: DC
  Filled 2014-07-17 (×2): qty 1

## 2014-07-17 MED ORDER — SENNOSIDES-DOCUSATE SODIUM 8.6-50 MG PO TABS
1.0000 | ORAL_TABLET | Freq: Two times a day (BID) | ORAL | Status: DC
  Administered 2014-07-17 – 2014-07-19 (×3): 1 via ORAL
  Filled 2014-07-17 (×5): qty 1

## 2014-07-17 NOTE — Progress Notes (Signed)
SICKLE CELL SERVICE PROGRESS NOTE  Alejandro Soto XKG:[id number] DOB: [birth year] DOA: 07/14/2014 PCP: Kipp Shank A., MD  Assessment/Plan: Active Problems:   Sickle cell anemia with pain  1. Hb SS with crisis:  Pain improved wit adjusted dose of Dilaudid via PCA. Pt has used 69 mg with 121/115: Demands/deliveries. I will continue PCA at current dose. Continue Toradol and decrease IVF as patient drinking well.  2. Leukocytosis: No evidence of infection. Likely related to crisis. Will continue to monitor.  Code Status: Full Code Family Communication: N/A Disposition Plan: Anticipate discharge home tomorrow  Alejandro Soto A.  Pager [phone]. If 7PM-7AM, please contact night-coverage.  07/17/2014, 12:23 PM  LOS: 3 days    Consultants:  None  Procedures:  None  Antibiotics:  None  HPI/Subjective: Pt states that he feels better however pain still 7/10 and localized to back and legs. . Last BM 3 days ago.  Objective: Filed Vitals:   07/17/14 0429 07/17/14 0431 07/17/14 0844 07/17/14 0937  BP:  118/73  124/72  Pulse:  77  78  Temp:  97.9 F (36.6 C)  98.6 F (37 C)  TempSrc:  Oral  Oral  Resp: 17 16 17  78  Height:      Weight:      SpO2: 98% 99% 99% 99%   Weight change:   Intake/Output Summary (Last 24 hours) at 07/17/14 1223 Last data filed at 07/17/14 1000  Gross per 24 hour  Intake   1897 ml  Output   4725 ml  Net  -2828 ml    General: Alert, awake, oriented x3, in no acute distress.  HEENT: [place]/AT PEERL, EOMI, anicteric Neck: Trachea midline,  no masses, no thyromegal,y no JVD, no carotid bruit OROPHARYNX:  Moist, No exudate/ erythema/lesions.  Heart: Regular rate and rhythm, without murmurs, rubs, gallops.  Lungs: Clear to auscultation, no wheezing or rhonchi noted.  Abdomen: Soft, nontender, nondistended, positive bowel sounds, no masses no hepatosplenomegaly noted.  Neuro: No focal neurological deficits noted cranial nerves II through XII grossly  intact. DTRs 2+ bilaterally upper and lower extremities. Strength normal in bilateral upper and lower extremities. Musculoskeletal: No warm swelling or erythema around joints, no spinal tenderness noted. Psychiatric: Patient alert and oriented x3, good insight and cognition, good recent to remote recall.    Data Reviewed: Basic Metabolic Panel:  Recent Labs Lab 07/14/14 1535 07/15/14 0440 07/16/14 0437 07/17/14 0455  NA 138 139 137 138  K 3.6 3.7 3.4* 3.9  CL 108 109 105 106  CO2 24 26 25 27   GLUCOSE 92 88 74 90  BUN 7 9 5* <5*  CREATININE 0.72 0.76 0.61 0.55  CALCIUM 9.2 8.9 8.4 8.6   Liver Function Tests:  Recent Labs Lab 07/16/14 0437 07/17/14 0455  AST 23 25  ALT 11 12  ALKPHOS 49 46  BILITOT 0.8 1.2  PROT 6.1 6.0  ALBUMIN 3.7 3.7   No results for input(s): LIPASE, AMYLASE in the last 168 hours. No results for input(s): AMMONIA in the last 168 hours. CBC:  Recent Labs Lab 07/14/14 1535 07/15/14 0440 07/16/14 0437 07/17/14 0455  WBC 10.9* 11.3* 12.9* 13.1*  NEUTROABS 7.3  --  8.0* 8.5*  HGB 10.1* 8.3* 8.6* 8.2*  HCT 28.9* 23.9* 24.6* 23.4*  MCV 89.8 89.8 90.4 89.7  PLT 516* 412* 407* 419*   Studies: No results found.  Scheduled Meds: . enoxaparin (LOVENOX) injection  40 mg Subcutaneous Q24H  . folic acid  1 mg Oral Daily  . gabapentin  300 mg Oral BID  . HYDROmorphone PCA 2 mg/mL   Intravenous 6 times per day  . hydroxyurea  1,500 mg Oral Daily  . multivitamin with minerals  2 tablet Oral Daily  . polyethylene glycol  17 g Oral Daily  . senna-docusate  1 tablet Oral BID   Continuous Infusions: . sodium chloride 100 mL/hr at 07/16/14 2249  . dextrose 5 % and 0.45% NaCl 1,000 mL (07/14/14 1959)    Active Problems:   Sickle cell anemia with pain

## 2014-07-18 NOTE — ED Provider Notes (Signed)
CSN: [id number]     Arrival date & time 07/14/14  1440 History   First MD Initiated Contact with Patient 07/14/14 1516     Chief Complaint  Patient presents with  . Sickle Cell Pain Crisis     (Consider location/radiation/quality/duration/timing/severity/associated sxs/prior Treatment) HPI   29 y.o. male with Sickle Cell SS disease who presents to the ED with complaints of 10/10 Back pain and muscle aches x 4 days without relief from his home pain Rx. He denies any fevers or chills , chest pain and SOB.He reports his pain is diffuse, but worse in his lower back. He states the current symptoms feel similar to previous painful sickle cell crises. Denies any trauma. No fevers or chills. No cough. No shortness of breath. No joint swelling. No rash. No dizziness or lightheadedness. Is taking his home medications without significant relief.  Past Medical History  Diagnosis Date  . Sickle cell anemia   . Pneumonia    Past Surgical History  Procedure Laterality Date  . No past surgeries     Family History  Problem Relation Age of Onset  . Diabetes Father   . Cancer Father 16    Prostate  . Asthma Brother   . Cancer Maternal Grandmother     colon    History  Substance Use Topics  . Smoking status: Current Some Day Smoker -- 0.50 packs/day for 5 years    Types: Cigarettes  . Smokeless tobacco: Never Used  . Alcohol Use: No    Review of Systems  All systems reviewed and negative, other than as noted in HPI.   Allergies  Amoxicillin  Home Medications   Prior to Admission medications   Medication Sig Start Date End Date Taking? Authorizing Provider  folic acid (FOLVITE) 1 MG tablet Take 1 mg by mouth daily.   Yes Historical Provider, MD  gabapentin (NEURONTIN) 300 MG capsule Take 1 capsule (300 mg total) by mouth 2 (two) times daily. 09/07/13  Yes Leana Gamer, MD  hydroxyurea (HYDREA) 500 MG capsule Take 3 capsules (1,500 mg total) by mouth daily. May take with food  to minimize GI side effects. 07/03/14  Yes Dorena Dew, FNP  ibuprofen (ADVIL,MOTRIN) 600 MG tablet Take 1 tablet (600 mg total) by mouth every 6 (six) hours as needed for mild pain or moderate pain. 12/15/13  Yes Leana Gamer, MD  Multiple Vitamin (MULTIVITAMIN) tablet Take 2 tablets by mouth daily.    Yes Historical Provider, MD  oxyCODONE (ROXICODONE) 15 MG immediate release tablet Take 1 tablet (15 mg total) by mouth every 6 (six) hours as needed for pain. 07/09/14  Yes Dorena Dew, FNP  triamcinolone (KENALOG) 0.025 % ointment Apply 1 application topically 2 (two) times daily. 07/03/14  Yes Dorena Dew, FNP  azithromycin (ZITHROMAX) 250 MG tablet Take 500 mg Day 1; Take 250 mg on days 2-5 Patient not taking: Reported on 07/14/2014 07/03/14   Dorena Dew, FNP   BP 120/65 mmHg  Pulse 69  Temp(Src) 98 F (36.7 C) (Oral)  Resp 13  Ht 5\' 7"  (1.702 m)  Wt 144 lb 6.4 oz (65.499 kg)  BMI 22.61 kg/m2  SpO2 95% Physical Exam  Constitutional: He appears well-developed and well-nourished. No distress.  HENT:  Head: Normocephalic and atraumatic.  Eyes: Conjunctivae are normal. Right eye exhibits no discharge. Left eye exhibits no discharge.  Neck: Neck supple.  Cardiovascular: Normal rate, regular rhythm and normal heart sounds.  Exam reveals no  gallop and no friction rub.   No murmur heard. Pulmonary/Chest: Effort normal and breath sounds normal. No respiratory distress.  Abdominal: Soft. He exhibits no distension. There is no tenderness.  Musculoskeletal: He exhibits no edema or tenderness.  Neurological: He is alert.  Skin: Skin is warm and dry.  Psychiatric: He has a normal mood and affect. His behavior is normal. Thought content normal.  Nursing note and vitals reviewed.   ED Course  Procedures (including critical care time) Labs Review Labs Reviewed  RETICULOCYTES - Abnormal; Notable for the following:    Retic Ct Pct 9.9 (*)    RBC. 3.22 (*)    Retic  Count, Manual 318.8 (*)    All other components within normal limits  CBC WITH DIFFERENTIAL/PLATELET - Abnormal; Notable for the following:    WBC 10.9 (*)    RBC 3.22 (*)    Hemoglobin 10.1 (*)    HCT 28.9 (*)    RDW 20.1 (*)    Platelets 516 (*)    All other components within normal limits  BASIC METABOLIC PANEL - Abnormal; Notable for the following:    Anion gap 4 (*)    All other components within normal limits  CBC - Abnormal; Notable for the following:    WBC 11.3 (*)    RBC 2.66 (*)    Hemoglobin 8.3 (*)    HCT 23.9 (*)    RDW 20.4 (*)    Platelets 412 (*)    All other components within normal limits  CBC WITH DIFFERENTIAL/PLATELET - Abnormal; Notable for the following:    WBC 12.9 (*)    RBC 2.72 (*)    Hemoglobin 8.6 (*)    HCT 24.6 (*)    RDW 20.3 (*)    Platelets 407 (*)    nRBC 4 (*)    Neutro Abs 8.0 (*)    All other components within normal limits  COMPREHENSIVE METABOLIC PANEL - Abnormal; Notable for the following:    Potassium 3.4 (*)    BUN 5 (*)    All other components within normal limits  CBC WITH DIFFERENTIAL/PLATELET - Abnormal; Notable for the following:    WBC 13.1 (*)    RBC 2.61 (*)    Hemoglobin 8.2 (*)    HCT 23.4 (*)    RDW 19.8 (*)    Platelets 419 (*)    Neutro Abs 8.5 (*)    All other components within normal limits  COMPREHENSIVE METABOLIC PANEL - Abnormal; Notable for the following:    BUN <5 (*)    All other components within normal limits  BASIC METABOLIC PANEL    Imaging Review No results found.   EKG Interpretation None      MDM   Final diagnoses:  Sickle cell crisis    29 year old male with likely sickle cell pain crisis. Pain is not adequately controlled in the emergency room. Low suspicion for emergent complication. Labs do not look excessively change from priors. We'll medically for further symptom control.    Virgel Manifold, MD 07/18/14 1355

## 2014-07-18 NOTE — Plan of Care (Signed)
Problem: Phase I Progression Outcomes Goal: Bowel Movement At Least Every 3 Days Outcome: Progressing Encouraged pt to follow bowel management plan. Pt agreed to take senna-cot tonight.

## 2014-07-18 NOTE — Progress Notes (Signed)
SICKLE CELL SERVICE PROGRESS NOTE  Aamir Mclinden HQP:[id number] DOB: 1986/06/02 DOA: 07/14/2014 PCP: Siyana Erney A., MD  Assessment/Plan: Active Problems:   Sickle cell anemia with pain  1. Hb SS with crisis:  Pain improved wit adjusted dose of Dilaudid via PCA. Pt has used 61.8 mg with 114/103 : Demands/deliveries. I will continue PCA at current dose. Continue Toradol and decrease IVF as patient drinking well.  2. Leukocytosis: No evidence of infection. Likely related to crisis. Will continue to monitor.  Code Status: Full Code Family Communication: N/A Disposition Plan: Anticipate discharge home tomorrow  Denton Derks A.  Pager [phone]. If 7PM-7AM, please contact night-coverage.  07/18/2014, 6:36 PM  LOS: 4 days    Consultants:  None  Procedures:  None  Antibiotics:  None  HPI/Subjective: Pt states that he feels better and pain improved to 6/10 and localized to back and legs. . Last BM last night.  Objective: Filed Vitals:   07/18/14 0531 07/18/14 0753 07/18/14 1352 07/18/14 1600  BP: 120/65     Pulse: 69     Temp: 98 F (36.7 C)     TempSrc: Oral     Resp: 15 13 17 16   Height:      Weight: 144 lb 6.4 oz (65.499 kg)     SpO2: 100% 95% 97% 96%   Weight change:   Intake/Output Summary (Last 24 hours) at 07/18/14 1836 Last data filed at 07/18/14 0900  Gross per 24 hour  Intake   1900 ml  Output   2200 ml  Net   -300 ml    General: Alert, awake, oriented x3, in no acute distress.  HEENT: [place]/AT PEERL, EOMI, anicteric Neck: Trachea midline,  no masses, no thyromegal,y no JVD, no carotid bruit OROPHARYNX:  Moist, No exudate/ erythema/lesions.  Heart: Regular rate and rhythm, without murmurs, rubs, gallops.  Lungs: Clear to auscultation, no wheezing or rhonchi noted.  Abdomen: Soft, nontender, nondistended, positive bowel sounds, no masses no hepatosplenomegaly noted.  Neuro: No focal neurological deficits noted cranial nerves II through XII  grossly intact. DTRs 2+ bilaterally upper and lower extremities. Strength normal in bilateral upper and lower extremities. Musculoskeletal: No warm swelling or erythema around joints, no spinal tenderness noted. Psychiatric: Patient alert and oriented x3, good insight and cognition, good recent to remote recall.    Data Reviewed: Basic Metabolic Panel:  Recent Labs Lab 07/14/14 1535 07/15/14 0440 07/16/14 0437 07/17/14 0455  NA 138 139 137 138  K 3.6 3.7 3.4* 3.9  CL 108 109 105 106  CO2 24 26 25 27   GLUCOSE 92 88 74 90  BUN 7 9 5* <5*  CREATININE 0.72 0.76 0.61 0.55  CALCIUM 9.2 8.9 8.4 8.6   Liver Function Tests:  Recent Labs Lab 07/16/14 0437 07/17/14 0455  AST 23 25  ALT 11 12  ALKPHOS 49 46  BILITOT 0.8 1.2  PROT 6.1 6.0  ALBUMIN 3.7 3.7   No results for input(s): LIPASE, AMYLASE in the last 168 hours. No results for input(s): AMMONIA in the last 168 hours. CBC:  Recent Labs Lab 07/14/14 1535 07/15/14 0440 07/16/14 0437 07/17/14 0455  WBC 10.9* 11.3* 12.9* 13.1*  NEUTROABS 7.3  --  8.0* 8.5*  HGB 10.1* 8.3* 8.6* 8.2*  HCT 28.9* 23.9* 24.6* 23.4*  MCV 89.8 89.8 90.4 89.7  PLT 516* 412* 407* 419*   Studies: No results found.  Scheduled Meds: . enoxaparin (LOVENOX) injection  40 mg Subcutaneous Q24H  . folic acid  1 mg Oral  Daily  . gabapentin  300 mg Oral BID  . HYDROmorphone PCA 2 mg/mL   Intravenous 6 times per day  . hydroxyurea  1,500 mg Oral Daily  . multivitamin with minerals  2 tablet Oral Daily  . polyethylene glycol  17 g Oral Daily  . senna-docusate  1 tablet Oral BID   Continuous Infusions: . sodium chloride 100 mL/hr at 07/18/14 0551  . dextrose 5 % and 0.45% NaCl 1,000 mL (07/14/14 1959)    Active Problems:   Sickle cell anemia with pain

## 2014-07-19 ENCOUNTER — Telehealth: Payer: Self-pay | Admitting: Internal Medicine

## 2014-07-19 DIAGNOSIS — G894 Chronic pain syndrome: Secondary | ICD-10-CM

## 2014-07-19 LAB — BASIC METABOLIC PANEL
Anion gap: 5 (ref 5–15)
BUN: 8 mg/dL (ref 6–23)
CO2: 28 mmol/L (ref 19–32)
Calcium: 8.8 mg/dL (ref 8.4–10.5)
Chloride: 105 mmol/L (ref 96–112)
Creatinine, Ser: 0.61 mg/dL (ref 0.50–1.35)
GFR calc Af Amer: >90 mL/min (ref >90)
GFR calc non Af Amer: >90 mL/min (ref >90)
Glucose, Bld: 80 mg/dL (ref 70–99)
Potassium: 3.8 mmol/L (ref 3.5–5.1)
Sodium: 138 mmol/L (ref 135–145)

## 2014-07-19 LAB — CBC WITH DIFFERENTIAL/PLATELET
Basophils Absolute: 0 10*3/uL (ref 0.0–0.1)
Basophils Relative: 0 % (ref 0–1)
Eosinophils Absolute: 0.2 10*3/uL (ref 0.0–0.7)
Eosinophils Relative: 2 % (ref 0–5)
HCT: 23.1 % — ABNORMAL LOW (ref 39.0–52.0)
Hemoglobin: 8 g/dL — ABNORMAL LOW (ref 13.0–17.0)
Lymphocytes Relative: 37 % (ref 12–46)
Lymphs Abs: 3 10*3/uL (ref 0.7–4.0)
MCH: 31.5 pg (ref 26.0–34.0)
MCHC: 34.6 g/dL (ref 30.0–36.0)
MCV: 90.9 fL (ref 78.0–100.0)
Monocytes Absolute: 0.4 10*3/uL (ref 0.1–1.0)
Monocytes Relative: 5 % (ref 3–12)
Neutro Abs: 4.5 10*3/uL (ref 1.7–7.7)
Neutrophils Relative %: 56 % (ref 43–77)
Platelets: 389 10*3/uL (ref 150–400)
RBC: 2.54 MIL/uL — ABNORMAL LOW (ref 4.22–5.81)
RDW: 20.5 % — ABNORMAL HIGH (ref 11.5–15.5)
WBC: 8.1 10*3/uL (ref 4.0–10.5)
nRBC: 7 /100 WBC — ABNORMAL HIGH

## 2014-07-19 LAB — LACTATE DEHYDROGENASE: LDH: 288 U/L — ABNORMAL HIGH (ref 94–250)

## 2014-07-19 NOTE — Discharge Summary (Signed)
Physician Discharge Summary  Patient ID: Alejandro Soto MRN: [id number] DOB/AGE: [birth year] 29 y.o.  Admit date: 07/14/2014 Discharge date: 07/19/2014  Admission Diagnoses:  Discharge Diagnoses:  Principal Problem:   Sickle cell crisis Active Problems:   Thrombocytosis   Leukocytosis   Anemia   Discharged Condition: good  Hospital Course: Patient was admitted with sickle cell painful crisis. He was treated with Dilaudid PCA, toradol, IVF as well as supportive care. He did very well and pain is now controlled. Patient was discharged to continue taking his medications at home and follow up with Dr Zigmund Daniel in the clinic as previously scheduled.   Consults: None  Significant Diagnostic Studies: labs: CBC, CMP checked serially. No evidence of hemolytic crisis.  Treatments: IV hydration  Discharge Exam: Blood pressure 116/74, pulse 60, temperature 98.2 F (36.8 C), temperature source Oral, resp. rate 16, height 5\' 7"  (1.702 m), weight 65.499 kg (144 lb 6.4 oz), SpO2 98 %. General appearance: alert, cooperative and no distress Eyes: conjunctivae/corneas clear. PERRL, EOM's intact. Fundi benign. Ears: normal TM's and external ear canals both ears Neck: no adenopathy, no carotid bruit, no JVD, supple, symmetrical, trachea midline and thyroid not enlarged, symmetric, no tenderness/mass/nodules Back: symmetric, no curvature. ROM normal. No CVA tenderness. Resp: clear to auscultation bilaterally Chest wall: no tenderness Cardio: regular rate and rhythm, S1, S2 normal, no murmur, click, rub or gallop GI: soft, non-tender; bowel sounds normal; no masses,  no organomegaly Extremities: extremities normal, atraumatic, no cyanosis or edema Pulses: 2+ and symmetric Skin: Skin color, texture, turgor normal. No rashes or lesions Neurologic: Grossly normal  Disposition: 01-Home or Self Care     Medication List    TAKE these medications        azithromycin 250 MG tablet  Commonly known  as:  ZITHROMAX  Take 500 mg Day 1; Take 250 mg on days 2-5     folic acid 1 MG tablet  Commonly known as:  FOLVITE  Take 1 mg by mouth daily.     gabapentin 300 MG capsule  Commonly known as:  NEURONTIN  Take 1 capsule (300 mg total) by mouth 2 (two) times daily.     hydroxyurea 500 MG capsule  Commonly known as:  HYDREA  Take 3 capsules (1,500 mg total) by mouth daily. May take with food to minimize GI side effects.     ibuprofen 600 MG tablet  Commonly known as:  ADVIL,MOTRIN  Take 1 tablet (600 mg total) by mouth every 6 (six) hours as needed for mild pain or moderate pain.     multivitamin tablet  Take 2 tablets by mouth daily.     oxyCODONE 15 MG immediate release tablet  Commonly known as:  ROXICODONE  Take 1 tablet (15 mg total) by mouth every 6 (six) hours as needed for pain.     triamcinolone 0.025 % ointment  Commonly known as:  KENALOG  Apply 1 application topically 2 (two) times daily.         SignedBarbette Merino 07/19/2014, 11:45 AM  Time spent 35 minutes

## 2014-07-19 NOTE — Progress Notes (Signed)
93ml of high concentration Dilaudid PCA wasted in sink with Elige Radon RN witness.

## 2014-07-19 NOTE — Telephone Encounter (Signed)
Refill request for Oxycodone 15mg . LOV 07/03/2014. Please advise. Thanks!

## 2014-07-23 MED ORDER — OXYCODONE HCL 15 MG PO TABS: 15.0000 mg | ORAL_TABLET | Freq: Four times a day (QID) | ORAL | Status: DC | PRN

## 2014-07-23 NOTE — Telephone Encounter (Signed)
Meds ordered this encounter  Medications  . oxyCODONE (ROXICODONE) 15 MG immediate release tablet    Sig: Take 1 tablet (15 mg total) by mouth every 6 (six) hours as needed for pain.    Dispense:  60 tablet    Refill:  0    Rx not to be filled prior to 07/27/2014    Order Specific Question:  Supervising Provider    Answer:  Liston Alba A [[id number]  Reviewed [place] Substance Reporting system prior to reorder Dorena Dew, FNP

## 2014-08-03 ENCOUNTER — Telehealth: Payer: Self-pay | Admitting: Internal Medicine

## 2014-08-03 DIAGNOSIS — G894 Chronic pain syndrome: Secondary | ICD-10-CM

## 2014-08-03 DIAGNOSIS — M545 Low back pain: Secondary | ICD-10-CM | POA: Diagnosis not present

## 2014-08-03 DIAGNOSIS — D57819 Other sickle-cell disorders with crisis, unspecified: Secondary | ICD-10-CM | POA: Diagnosis not present

## 2014-08-03 DIAGNOSIS — R52 Pain, unspecified: Secondary | ICD-10-CM | POA: Diagnosis not present

## 2014-08-03 DIAGNOSIS — Z79899 Other long term (current) drug therapy: Secondary | ICD-10-CM | POA: Diagnosis not present

## 2014-08-03 NOTE — Telephone Encounter (Signed)
Refill request for oxycodone 15mg  LOV 07/03/2014. Please advise. Thanks!

## 2014-08-06 MED ORDER — OXYCODONE HCL 15 MG PO TABS: 15.0000 mg | ORAL_TABLET | Freq: Four times a day (QID) | ORAL | Status: DC | PRN

## 2014-08-06 NOTE — Telephone Encounter (Signed)
Meds ordered this encounter  Medications  . oxyCODONE (ROXICODONE) 15 MG immediate release tablet    Sig: Take 1 tablet (15 mg total) by mouth every 6 (six) hours as needed for pain.    Dispense:  60 tablet    Refill:  0    Order Specific Question:  Supervising Provider    Answer:  Liston Alba A [[id number]  Reviewed [place] Substance Reporting system prior to reorder Dorena Dew, FNP

## 2014-08-09 ENCOUNTER — Encounter (HOSPITAL_COMMUNITY): Payer: Self-pay | Admitting: *Deleted

## 2014-08-09 ENCOUNTER — Non-Acute Institutional Stay (HOSPITAL_COMMUNITY)
Admission: AD | Admit: 2014-08-09 | Discharge: 2014-08-09 | Disposition: A | Discharge status: Home / Self Care | Payer: Medicare Other | Attending: Internal Medicine | Admitting: Internal Medicine

## 2014-08-09 ENCOUNTER — Telehealth: Payer: Self-pay | Admitting: Family Medicine

## 2014-08-09 ENCOUNTER — Telehealth (HOSPITAL_COMMUNITY): Payer: Self-pay | Admitting: *Deleted

## 2014-08-09 DIAGNOSIS — G894 Chronic pain syndrome: Secondary | ICD-10-CM | POA: Diagnosis not present

## 2014-08-09 DIAGNOSIS — Z79899 Other long term (current) drug therapy: Secondary | ICD-10-CM | POA: Diagnosis not present

## 2014-08-09 DIAGNOSIS — Z7952 Long term (current) use of systemic steroids: Secondary | ICD-10-CM | POA: Diagnosis not present

## 2014-08-09 DIAGNOSIS — D5700 Hb-SS disease with crisis, unspecified: Secondary | ICD-10-CM | POA: Diagnosis not present

## 2014-08-09 DIAGNOSIS — D649 Anemia, unspecified: Secondary | ICD-10-CM | POA: Diagnosis present

## 2014-08-09 DIAGNOSIS — Z87891 Personal history of nicotine dependence: Secondary | ICD-10-CM | POA: Insufficient documentation

## 2014-08-09 DIAGNOSIS — E860 Dehydration: Secondary | ICD-10-CM | POA: Diagnosis not present

## 2014-08-09 DIAGNOSIS — M549 Dorsalgia, unspecified: Secondary | ICD-10-CM | POA: Diagnosis present

## 2014-08-09 LAB — COMPREHENSIVE METABOLIC PANEL
ALBUMIN: 4.1 g/dL (ref 3.5–5.2)
ALK PHOS: 43 U/L (ref 39–117)
ALT: 12 U/L (ref 0–53)
AST: 24 U/L (ref 0–37)
Anion gap: 3 — ABNORMAL LOW (ref 5–15)
BUN: 6 mg/dL (ref 6–23)
CO2: 28 mmol/L (ref 19–32)
CREATININE: 0.73 mg/dL (ref 0.50–1.35)
Calcium: 8.7 mg/dL (ref 8.4–10.5)
Chloride: 108 mmol/L (ref 96–112)
GFR calc Af Amer: >90 mL/min (ref >90)
Glucose, Bld: 116 mg/dL — ABNORMAL HIGH (ref 70–99)
POTASSIUM: 3.4 mmol/L — AB (ref 3.5–5.1)
SODIUM: 139 mmol/L (ref 135–145)
Total Bilirubin: 0.9 mg/dL (ref 0.3–1.2)
Total Protein: 7 g/dL (ref 6.0–8.3)

## 2014-08-09 LAB — LACTATE DEHYDROGENASE: LDH: 212 U/L (ref 94–250)

## 2014-08-09 LAB — CBC WITH DIFFERENTIAL/PLATELET
BASOS ABS: 0 10*3/uL (ref 0.0–0.1)
Basophils Relative: 0 % (ref 0–1)
Eosinophils Absolute: 0.2 10*3/uL (ref 0.0–0.7)
Eosinophils Relative: 3 % (ref 0–5)
HCT: 29.7 % — ABNORMAL LOW (ref 39.0–52.0)
Hemoglobin: 10.3 g/dL — ABNORMAL LOW (ref 13.0–17.0)
LYMPHS PCT: 29 % (ref 12–46)
Lymphs Abs: 2 10*3/uL (ref 0.7–4.0)
MCH: 33.4 pg (ref 26.0–34.0)
MCHC: 34.7 g/dL (ref 30.0–36.0)
MCV: 96.4 fL (ref 78.0–100.0)
Monocytes Absolute: 0.7 10*3/uL (ref 0.1–1.0)
Monocytes Relative: 11 % (ref 3–12)
NEUTROS ABS: 4 10*3/uL (ref 1.7–7.7)
Neutrophils Relative %: 57 % (ref 43–77)
PLATELETS: 568 10*3/uL — AB (ref 150–400)
RBC: 3.08 MIL/uL — ABNORMAL LOW (ref 4.22–5.81)
RDW: 18.7 % — AB (ref 11.5–15.5)
WBC: 6.9 10*3/uL (ref 4.0–10.5)

## 2014-08-09 LAB — RETICULOCYTES
RBC.: 3.08 MIL/uL — AB (ref 4.22–5.81)
RETIC CT PCT: 9 % — AB (ref 0.4–3.1)
Retic Count, Absolute: 277.2 10*3/uL — ABNORMAL HIGH (ref 19.0–186.0)

## 2014-08-09 MED ORDER — OXYCODONE HCL 5 MG PO TABS
15.0000 mg | ORAL_TABLET | Freq: Once | ORAL | Status: AC
Start: 2014-08-09 — End: 2014-08-09
  Administered 2014-08-09: 15 mg via ORAL
  Filled 2014-08-09: qty 3

## 2014-08-09 MED ORDER — SENNOSIDES-DOCUSATE SODIUM 8.6-50 MG PO TABS: 1.0000 | ORAL_TABLET | Freq: Two times a day (BID) | ORAL | Status: DC

## 2014-08-09 MED ORDER — POLYETHYLENE GLYCOL 3350 17 G PO PACK
17.0000 g | PACK | Freq: Every day | ORAL | Status: DC | PRN
  Filled 2014-08-09: qty 1

## 2014-08-09 MED ORDER — DEXTROSE-NACL 5-0.45 % IV SOLN
INTRAVENOUS | Status: DC
  Administered 2014-08-09: 11:00:00 via INTRAVENOUS

## 2014-08-09 MED ORDER — DIPHENHYDRAMINE HCL 12.5 MG/5ML PO ELIX: 12.5000 mg | ORAL_SOLUTION | Freq: Four times a day (QID) | ORAL | Status: DC | PRN

## 2014-08-09 MED ORDER — DIPHENHYDRAMINE HCL 50 MG/ML IJ SOLN
12.5000 mg | Freq: Four times a day (QID) | INTRAMUSCULAR | Status: DC | PRN
  Filled 2014-08-09: qty 0.25

## 2014-08-09 MED ORDER — ONDANSETRON HCL 4 MG/2ML IJ SOLN: 4.0000 mg | Freq: Four times a day (QID) | INTRAMUSCULAR | Status: DC | PRN

## 2014-08-09 MED ORDER — NALOXONE HCL 0.4 MG/ML IJ SOLN: 0.4000 mg | INTRAMUSCULAR | Status: DC | PRN

## 2014-08-09 MED ORDER — FOLIC ACID 1 MG PO TABS: 1.0000 mg | ORAL_TABLET | Freq: Every day | ORAL | Status: DC

## 2014-08-09 MED ORDER — KETOROLAC TROMETHAMINE 30 MG/ML IJ SOLN
30.0000 mg | Freq: Four times a day (QID) | INTRAMUSCULAR | Status: DC
  Administered 2014-08-09: 30 mg via INTRAVENOUS
  Filled 2014-08-09: qty 1

## 2014-08-09 MED ORDER — HYDROMORPHONE 2 MG/ML HIGH CONCENTRATION IV PCA SOLN
INTRAVENOUS | Status: DC
  Administered 2014-08-09: 5 mg via INTRAVENOUS
  Administered 2014-08-09: 11:00:00 via INTRAVENOUS
  Administered 2014-08-09: 12.5 mg via INTRAVENOUS
  Filled 2014-08-09: qty 25

## 2014-08-09 MED ORDER — SODIUM CHLORIDE 0.9 % IJ SOLN: 9.0000 mL | INTRAMUSCULAR | Status: DC | PRN

## 2014-08-09 NOTE — Discharge Summary (Signed)
Physician Discharge Summary  Patient ID: Benz Vandenberghe MRN: [id number] DOB/AGE: March 15, 1986 29 y.o.  Admit date: 08/09/2014 Discharge date: 08/09/2014  Admission Diagnoses:  Discharge Diagnoses:  Active Problems:   Hb-SS disease with crisis   Sickle cell anemia with pain   Anemia   Discharged Condition: good  Hospital Course: A 29 year old gentleman admitted to the day hospital with sickle cell painful crisis. Patient was treated with IV Dilaudid PCA Toradol as well as IV fluid. His pain has been brought down from 8 out of 10-4 out of 10. He was discharged to continue home medications and complete treatment at home. No shortness of breath or cough. And he was stable at the time of discharge.  Consults: None  Significant Diagnostic Studies: labs: CBCs and CMP is checked. All within normal limits.  Treatments: IV hydration and analgesia: Dilaudid  Discharge Exam: Blood pressure 109/66, pulse 60, temperature 98.2 F (36.8 C), temperature source Oral, resp. rate 17, height 5\' 8"  (1.727 m), weight 65.772 kg (145 lb), SpO2 97 %. General appearance: alert, cooperative and no distress Head: Normocephalic, without obvious abnormality, atraumatic Eyes: conjunctivae/corneas clear. PERRL, EOM's intact. Fundi benign. Neck: no adenopathy, no carotid bruit, no JVD, supple, symmetrical, trachea midline and thyroid not enlarged, symmetric, no tenderness/mass/nodules Back: symmetric, no curvature. ROM normal. No CVA tenderness. Resp: clear to auscultation bilaterally Chest wall: no tenderness Cardio: regular rate and rhythm, S1, S2 normal, no murmur, click, rub or gallop GI: soft, non-tender; bowel sounds normal; no masses,  no organomegaly Extremities: extremities normal, atraumatic, no cyanosis or edema Pulses: 2+ and symmetric Skin: Skin color, texture, turgor normal. No rashes or lesions Neurologic: Grossly normal  Disposition: 01-Home or Self Care     Medication List    TAKE these  medications        azithromycin 250 MG tablet  Commonly known as:  ZITHROMAX  Take 500 mg Day 1; Take 250 mg on days 2-5     folic acid 1 MG tablet  Commonly known as:  FOLVITE  Take 1 mg by mouth daily.     gabapentin 300 MG capsule  Commonly known as:  NEURONTIN  Take 1 capsule (300 mg total) by mouth 2 (two) times daily.     hydroxyurea 500 MG capsule  Commonly known as:  HYDREA  Take 3 capsules (1,500 mg total) by mouth daily. May take with food to minimize GI side effects.     ibuprofen 600 MG tablet  Commonly known as:  ADVIL,MOTRIN  Take 1 tablet (600 mg total) by mouth every 6 (six) hours as needed for mild pain or moderate pain.     multivitamin tablet  Take 2 tablets by mouth daily.     oxyCODONE 15 MG immediate release tablet  Commonly known as:  ROXICODONE  Take 1 tablet (15 mg total) by mouth every 6 (six) hours as needed for pain.     triamcinolone 0.025 % ointment  Commonly known as:  KENALOG  Apply 1 application topically 2 (two) times daily.         SignedBarbette Merino 08/09/2014, 6:14 PM

## 2014-08-09 NOTE — Progress Notes (Signed)
Patient ID: Alejandro Soto, male   DOB: [birth year], 29 y.o.   MRN: [id number] Dx: Sickle cell anemia Dr.Matthews Pt arrived with generalized pain 9/10. Admitted for evaluation and treatment with PCA dilaudid and fluid infusion. Labs drawn, IV access initiated. Tolerated procedure well; ambulatory and A/O post procedure.  Discharged home. Alejandro Soto, Alejandro Soto

## 2014-08-09 NOTE — Telephone Encounter (Signed)
Pt called to [facility] with pain 8/10 in back, ankles, shoulders. Took Oxycodone 15 mg in am with no relief. Denies fever, N/V/D, abdominal pain, priapism, Dr.Garba notified, pt is ok to come in to day center. Pt is called and states he will be here in 20 minutes. Kendyl Bissonnette, Eustaquio Maize

## 2014-08-09 NOTE — Telephone Encounter (Signed)
Pt has had increased use of analgesics during the recent stressful events (loss of his father and grandmother) and weather changes. On last visit the idea of increasing dosage of short acting medications were discussed. Howeverwanted to try other measures before increasing dose. However he has not been able to mange without dose increase and was instructed to increase dose to 1 tab every 4 hours.This has been discussed with Alejandro Soto at length and he is to make an appointment to discuss re-assessment of his pain management.  Dorena Dew, FNP

## 2014-08-09 NOTE — H&P (Signed)
Alejandro Soto is an 29 y.o. male.   Chief Complaint: Pain in back and chest for 4 days HPI: A 29 year old gentleman was known history of sickle cell disease who has probably been having chest pain back pain and limb pain consistent with his sickle cell crisis for the last 4 days. He denies shortness of breath or cough he denied any nausea vomiting or diarrhea. Pain is persistent and mainly bone in nature. Is consistent with mechanical pain he had in the past when he had sickle cell crisis. He's been taking his home medications as prescribed. Pain is not getting better so he decided to come in for treatment.  Past Medical History  Diagnosis Date  . Sickle cell anemia   . Pneumonia     Past Surgical History  Procedure Laterality Date  . No past surgeries      Family History  Problem Relation Age of Onset  . Diabetes Father   . Cancer Father 17    Prostate  . Asthma Brother   . Cancer Maternal Grandmother     colon    Social History:  reports that he has been smoking Cigarettes.  He has a 2.5 pack-year smoking history. He has never used smokeless tobacco. He reports that he does not drink alcohol or use illicit drugs.  Allergies:  Allergies  Allergen Reactions  . Amoxicillin Diarrhea    Medications Prior to Admission  Medication Sig Dispense Refill  . folic acid (FOLVITE) 1 MG tablet Take 1 mg by mouth daily.    Marland Kitchen gabapentin (NEURONTIN) 300 MG capsule Take 1 capsule (300 mg total) by mouth 2 (two) times daily. 60 capsule 2  . hydroxyurea (HYDREA) 500 MG capsule Take 3 capsules (1,500 mg total) by mouth daily. May take with food to minimize GI side effects. 90 capsule 3  . ibuprofen (ADVIL,MOTRIN) 600 MG tablet Take 1 tablet (600 mg total) by mouth every 6 (six) hours as needed for mild pain or moderate pain. 30 tablet 1  . Multiple Vitamin (MULTIVITAMIN) tablet Take 2 tablets by mouth daily.     Marland Kitchen oxyCODONE (ROXICODONE) 15 MG immediate release tablet Take 1 tablet (15 mg total) by  mouth every 6 (six) hours as needed for pain. 60 tablet 0  . triamcinolone (KENALOG) 0.025 % ointment Apply 1 application topically 2 (two) times daily. 30 g 2  . azithromycin (ZITHROMAX) 250 MG tablet Take 500 mg Day 1; Take 250 mg on days 2-5 (Patient not taking: Reported on 07/14/2014) 6 tablet 0    No results found for this or any previous visit (from the past 48 hour(s)). No results found.  Review of Systems  Constitutional: Negative.   HENT: Negative.   Eyes: Negative.   Cardiovascular: Negative.   Gastrointestinal: Negative.   Genitourinary: Negative.   Musculoskeletal: Positive for myalgias, back pain and joint pain. Negative for falls.  Skin: Negative.   Neurological: Negative.   Endo/Heme/Allergies: Negative.   Psychiatric/Behavioral: Negative.     Blood pressure 118/67, pulse 85, temperature 98.5 F (36.9 C), temperature source Oral, resp. rate 16, height 5\' 8"  (1.727 m), weight 65.772 kg (145 lb), SpO2 100 %. Physical Exam  Constitutional: He is oriented to person, place, and time. He appears well-developed and well-nourished.  HENT:  Head: Normocephalic and atraumatic.  Right Ear: External ear normal.  Left Ear: External ear normal.  Mouth/Throat: Oropharynx is clear and moist.  Eyes: Conjunctivae and EOM are normal. Pupils are equal, round, and reactive to light.  Neck: Normal range of motion. Neck supple.  Cardiovascular: Normal rate, regular rhythm, normal heart sounds and intact distal pulses.   Respiratory: Effort normal and breath sounds normal.  GI: Soft. Bowel sounds are normal.  Musculoskeletal: Normal range of motion. He exhibits tenderness. He exhibits no edema.  Neurological: He is alert and oriented to person, place, and time. He has normal reflexes.  Skin: Skin is warm and dry.  Psychiatric: He has a normal mood and affect.     Assessment/Plan A 29 year old gentleman coming to the day hospital with significant pain in his limbs and back consistent  with sickle cell painful crisis. Next  #1 sickle cell painful crisis: Patient's pain will be treated with IV Dilaudid PCA, Toradol and IV fluids. We will monitor his response and discharge him home at the end of the day.  #2 sickle cell anemia: We'll check his H&H and follow closely. If transfusion is warranted we will transfuse.  #3 dehydration: Patient seems dehydrated will hydrate him cautiously. Next  #4 chronic pain syndrome: Patient will because she is to oral medications once his acute pain is been treated.  Alejandro Soto,LAWAL 08/09/2014, 10:58 AM

## 2014-08-13 ENCOUNTER — Other Ambulatory Visit: Payer: Self-pay

## 2014-08-13 DIAGNOSIS — G894 Chronic pain syndrome: Secondary | ICD-10-CM

## 2014-08-13 NOTE — Telephone Encounter (Signed)
Refill request for Oxycodone 15mg  LOV 07/03/2014, Patient states he is out was told to take more last week for recent crisis and wants to know if he can pick up on Tuesday? Please advise. Thanks!

## 2014-08-16 ENCOUNTER — Other Ambulatory Visit: Payer: Self-pay | Admitting: Family Medicine

## 2014-08-16 ENCOUNTER — Other Ambulatory Visit: Payer: Self-pay

## 2014-08-16 DIAGNOSIS — G894 Chronic pain syndrome: Secondary | ICD-10-CM

## 2014-08-16 MED ORDER — OXYCODONE HCL 15 MG PO TABS: 15.0000 mg | ORAL_TABLET | Freq: Four times a day (QID) | ORAL | Status: DC | PRN

## 2014-08-16 NOTE — Telephone Encounter (Signed)
Refill request for oxycodone 15mg . LOV  07/03/2014. Please advise. Thanks!

## 2014-08-16 NOTE — Progress Notes (Signed)
Pt has had increased use of analgesics during the recent stressful events and weather changes. On last visit the idea of increasing dosage of short acting medications were discussed. However, wanted to try other measures before increasing dose. However he has not been able to manage chronic pain without dose increase and was instructed to increase dose to 1 tab every 4 hours 1 week ago.This has been discussed with Alejandro Soto at length and he is to make an appointment to discuss re-assessment of his pain management.  Meds ordered this encounter  Medications  . oxyCODONE (ROXICODONE) 15 MG immediate release tablet    Sig: Take 1 tablet (15 mg total) by mouth every 6 (six) hours as needed for pain.    Dispense:  60 tablet    Refill:  0    Order Specific Question:  Supervising Provider    Answer:  Liston Alba A [[id number]]    Dorena Dew, FNP

## 2014-08-17 ENCOUNTER — Other Ambulatory Visit: Payer: Medicare Other

## 2014-08-17 DIAGNOSIS — G894 Chronic pain syndrome: Secondary | ICD-10-CM | POA: Diagnosis not present

## 2014-08-20 LAB — OPIATES/OPIOIDS (LC/MS-MS)
Codeine Urine: 176 ng/mL — AB (ref <50)
HYDROCODONE: NEGATIVE ng/mL (ref <50)
HYDROMORPHONE: 62 ng/mL — AB (ref <50)
Morphine Urine: 17733 ng/mL — AB (ref <50)
NOROXYCODONE, UR: NEGATIVE ng/mL (ref <50)
Norhydrocodone, Ur: NEGATIVE ng/mL (ref <50)
Oxycodone, ur: NEGATIVE ng/mL (ref <50)
Oxymorphone: NEGATIVE ng/mL (ref <50)

## 2014-08-20 LAB — FENTANYL (GC/LC/MS), URINE
Fentanyl, confirm: 34.2 ng/mL — AB (ref <0.5)
Norfentanyl, confirm: 627.2 ng/mL — AB (ref <0.5)

## 2014-08-20 LAB — CANNABANOIDS (GC/LC/MS), URINE: THC-COOH UR CONFIRM: 272 ng/mL — AB (ref <5)

## 2014-08-21 ENCOUNTER — Encounter: Payer: Self-pay | Admitting: Internal Medicine

## 2014-08-21 ENCOUNTER — Telehealth: Payer: Self-pay | Admitting: Internal Medicine

## 2014-08-21 ENCOUNTER — Telehealth: Payer: Self-pay | Admitting: Family Medicine

## 2014-08-21 DIAGNOSIS — M545 Low back pain: Secondary | ICD-10-CM | POA: Diagnosis not present

## 2014-08-21 DIAGNOSIS — D57819 Other sickle-cell disorders with crisis, unspecified: Secondary | ICD-10-CM | POA: Diagnosis not present

## 2014-08-21 LAB — PRESCRIPTION MONITORING PROFILE (SOLSTAS)
Amphetamine/Meth: NEGATIVE ng/mL
Barbiturate Screen, Urine: NEGATIVE ng/mL
Benzodiazepine Screen, Urine: NEGATIVE ng/mL
Buprenorphine, Urine: NEGATIVE ng/mL
CREATININE, URINE: 103.48 mg/dL (ref >20.0)
Carisoprodol, Urine: NEGATIVE ng/mL
Cocaine Metabolites: NEGATIVE ng/mL
ECSTASY: NEGATIVE ng/mL
METHADONE SCREEN, URINE: NEGATIVE ng/mL
Meperidine, Ur: NEGATIVE ng/mL
NITRITES URINE, INITIAL: NEGATIVE ug/mL
Oxycodone Screen, Ur: NEGATIVE ng/mL
PH URINE, INITIAL: 6.1 pH (ref 4.5–8.9)
Propoxyphene: NEGATIVE ng/mL
Tapentadol, urine: NEGATIVE ng/mL
Tramadol Scrn, Ur: NEGATIVE ng/mL
Zolpidem, Urine: NEGATIVE ng/mL

## 2014-08-21 NOTE — Telephone Encounter (Signed)
Dr. Caryn Section from [facility] called Re: Alejandro Soto. Pt presented to the ED at [facility] stating out of medications. Pt filled a prescription for 15 day supply only 5 days ago so should have medications.Pt has not called the office with any recent complaints and has not been advised to increase his dose. If patient is not admitted to [facility] he is to call the office tomorrow to discuss his medications and medical issues and needs.

## 2014-08-21 NOTE — Telephone Encounter (Signed)
Alejandro Soto was given an Rx on 08/17/2013 for Oxycodone 15 mg every 6 hours #90, approximately 8 days after previous Rx was written. Patient was given instructions previously on 2/18 to take 15 mg every 4 hours for moderate to severe pain, which caused his prescription to run out sooner than anticipated. An appointment is scheduled for 08/23/2014 for medication management. He will need to follow up with provider about chronic pain.   Reviewed [place] Substance Reporting system prior to reorder   Dorena Dew, FNP

## 2014-08-23 ENCOUNTER — Ambulatory Visit: Payer: Medicare Other | Admitting: Internal Medicine

## 2014-08-28 ENCOUNTER — Telehealth: Payer: Self-pay | Admitting: Internal Medicine

## 2014-08-28 NOTE — Telephone Encounter (Signed)
Please inform Alejandro Soto that he missed his last appointment for medication management. He will need to schedule a follow up appointment before receiving a prescription for opiate medications.   Dorena Dew, FNP

## 2014-08-28 NOTE — Telephone Encounter (Signed)
Refill request for oxycodone 15mg . LOV 07/03/2014. Please advise. Thanks!

## 2014-08-29 NOTE — Telephone Encounter (Signed)
Charlie, Please schedule appointment with Dr. Zigmund Daniel for patient. Thanks!

## 2014-08-30 ENCOUNTER — Ambulatory Visit (INDEPENDENT_AMBULATORY_CARE_PROVIDER_SITE_OTHER): Payer: Medicare Other | Admitting: Internal Medicine

## 2014-08-30 ENCOUNTER — Encounter: Payer: Self-pay | Admitting: Internal Medicine

## 2014-08-30 VITALS — BP 126/76 | HR 57 | Temp 98.6°F | Resp 16 | Ht 67.0 in | Wt 137.0 lb

## 2014-08-30 DIAGNOSIS — G894 Chronic pain syndrome: Secondary | ICD-10-CM

## 2014-08-30 DIAGNOSIS — Z13820 Encounter for screening for osteoporosis: Secondary | ICD-10-CM

## 2014-08-30 DIAGNOSIS — D5720 Sickle-cell/Hb-C disease without crisis: Secondary | ICD-10-CM

## 2014-08-30 DIAGNOSIS — Z0000 Encounter for general adult medical examination without abnormal findings: Secondary | ICD-10-CM

## 2014-08-30 DIAGNOSIS — E559 Vitamin D deficiency, unspecified: Secondary | ICD-10-CM

## 2014-08-30 MED ORDER — OXYCODONE HCL 15 MG PO TABS: 15.0000 mg | ORAL_TABLET | Freq: Four times a day (QID) | ORAL | Status: DC | PRN

## 2014-08-30 NOTE — Progress Notes (Signed)
Patient ID: Alejandro Soto, male   DOB: 11-25-1985, 29 y.o.   MRN: [id number]  CC: Here to follow up on Hb [place].   HPI: Pt with Hb [place] here to follow up on SCD. Pt has had only 2 visits to the ED sinc ehis last visit. He states that he is doing well and tha his current medication is working sufficiently.  He states that after his last hospitalization he required an increased dose of Oxycodone which he discussed with the NP  immediately post-discharge but is now back to his usual dose.   He has no other complaints at this time.   Allergies  Allergen Reactions  . Amoxicillin Diarrhea   Past Medical History  Diagnosis Date  . Sickle cell anemia   . Pneumonia    Current Outpatient Prescriptions on File Prior to Visit  Medication Sig Dispense Refill  . folic acid (FOLVITE) 1 MG tablet Take 1 mg by mouth daily.    Marland Kitchen gabapentin (NEURONTIN) 300 MG capsule Take 1 capsule (300 mg total) by mouth 2 (two) times daily. 60 capsule 2  . hydroxyurea (HYDREA) 500 MG capsule Take 3 capsules (1,500 mg total) by mouth daily. May take with food to minimize GI side effects. 90 capsule 3  . ibuprofen (ADVIL,MOTRIN) 600 MG tablet Take 1 tablet (600 mg total) by mouth every 6 (six) hours as needed for mild pain or moderate pain. 30 tablet 1  . Multiple Vitamin (MULTIVITAMIN) tablet Take 2 tablets by mouth daily.     Marland Kitchen azithromycin (ZITHROMAX) 250 MG tablet Take 500 mg Day 1; Take 250 mg on days 2-5 (Patient not taking: Reported on 07/14/2014) 6 tablet 0  . triamcinolone (KENALOG) 0.025 % ointment Apply 1 application topically 2 (two) times daily. (Patient not taking: Reported on 08/30/2014) 30 g 2   No current facility-administered medications on file prior to visit.   Family History  Problem Relation Age of Onset  . Diabetes Father   . Cancer Father 74    Prostate  . Asthma Brother   . Cancer Maternal Grandmother     colon    History   Social History  . Marital Status: Single    Spouse Name: N/A  . Number  of Children: N/A  . Years of Education: N/A   Occupational History  . Not on file.   Social History Main Topics  . Smoking status: Current Some Day Smoker -- 0.50 packs/day for 5 years    Types: Cigarettes  . Smokeless tobacco: Never Used  . Alcohol Use: No  . Drug Use: No  . Sexual Activity: Yes   Other Topics Concern  . Not on file   Social History Narrative    Review of Systems: Constitutional: Negative for fever, chills, diaphoresis, activity change, appetite change and fatigue. HENT: Negative for ear pain, nosebleeds, congestion, facial swelling, rhinorrhea, neck pain, neck stiffness and ear discharge.  Eyes: Negative for pain, discharge, redness, itching and visual disturbance. Respiratory: Negative for cough, choking, chest tightness, shortness of breath, wheezing and stridor.  Cardiovascular: Negative for chest pain, palpitations and leg swelling. Gastrointestinal: Negative for abdominal distention. Genitourinary: Negative for dysuria, urgency, frequency, hematuria, flank pain, decreased urine volume, difficulty urinating.  Musculoskeletal: Negative for back pain, joint swelling, arthralgias and gait problem. Neurological: Negative for dizziness, tremors, seizures, syncope, facial asymmetry, speech difficulty, weakness, light-headedness, numbness and headaches.  Hematological: Negative for adenopathy. Does not bruise/bleed easily. Psychiatric/Behavioral: Negative for hallucinations, behavioral problems, confusion, dysphoric mood, decreased concentration  and agitation.    Objective:   Filed Vitals:   08/30/14 0844  BP: 126/76  Pulse: 57  Temp: 98.6 F (37 C)  Resp: 16    Physical Exam: Constitutional: Patient appears well-developed and well-nourished. No distress. HENT: Normocephalic, atraumatic, External right and left ear normal. Oropharynx is clear and moist.  Eyes: Conjunctivae and EOM are normal. PERRLA, no scleral icterus. Neck: Normal ROM. Neck supple.  No JVD. No tracheal deviation. No thyromegaly. CVS: RRR, S1/S2 +, no murmurs, no gallops, no carotid bruit.  Pulmonary: Effort and breath sounds normal, no stridor, rhonchi, wheezes, rales.  Abdominal: Soft. BS +,  no distension, tenderness, rebound or guarding.  Musculoskeletal: Normal range of motion. No edema and no tenderness.  Lymphadenopathy: No lymphadenopathy noted, cervical, inguinal or axillary Neuro: Alert. Normal reflexes, muscle tone coordination. No cranial nerve deficit. Skin: Skin is warm and dry. No rash noted. Not diaphoretic. No erythema. No pallor. Psychiatric: Normal mood and affect. Behavior, judgment, thought content normal.  Lab Results  Component Value Date   WBC 6.9 08/09/2014   HGB 10.3* 08/09/2014   HCT 29.7* 08/09/2014   MCV 96.4 08/09/2014   PLT 568* 08/09/2014   Lab Results  Component Value Date   CREATININE 0.73 08/09/2014   BUN 6 08/09/2014   NA 139 08/09/2014   K 3.4* 08/09/2014   CL 108 08/09/2014   CO2 28 08/09/2014    Lab Results  Component Value Date   HGBA1C <4.0 09/07/2013   Lipid Panel  No results found for: CHOL, TRIG, HDL, CHOLHDL, VLDL, LDLCALC     Assessment and plan:   Patient Active Problem List   Diagnosis Date Noted  . Thrombocytosis 07/14/2014  . Leukocytosis 07/14/2014  . Anemia 07/14/2014  . Sore throat 07/03/2014  . Sebaceous cyst 07/03/2014  . Atopic dermatitis 06/12/2014  . Tobacco dependence 04/25/2014  . Neck pain, bilateral 03/01/2014  . Hb-SS disease without crisis 02/22/2014  . DJD (degenerative joint disease), lumbosacral 09/18/2013  . Vitamin D deficiency 09/18/2013  . Hyperglycemia 09/18/2013  . Neuropathic pain 09/18/2013  . Back pain, acute 08/11/2013  . Sickle cell crisis 08/11/2013  . Sickle cell anemia with pain 08/11/2013  . Opiate use 05/24/2013  . Chronic pain syndrome 05/24/2013  . Sickle cell disease, type SS 05/24/2013  . Hb-SS disease with crisis 12/12/2012  . Back pain 11/01/2012   . Pain in joint, shoulder region 11/01/2012  . Pain in joint, lower leg 11/01/2012   1. Hb-S/Hb-C disease, without crisis - Pt doing well with pain control at current dose.  Continue Oxycodone 15 mg q 6 hours as needed. Pt will continue to wean his dose down by no more than 10% at a time.  - CBC with Differential/Platelet; Future - Reticulocytes; Future - Comprehensive metabolic panel; Future - Lactate dehydrogenase; Future   2. Chronic pain syndrome - oxyCODONE (ROXICODONE) 15 MG immediate release tablet; Take 1 tablet (15 mg total) by mouth every 6 (six) hours as needed for pain.  Dispense: 60 tablet; Refill: 0   3. Vitamin D deficiency - Pt has not been compliant with vitamin D supplementation. I have encouraged him to take Vitamin D 2000 IU daily.  Follow up in 2 months for Annual Physical        Alyscia Carmon A., MD [place], [place]  08/30/2014, 10:15 AM

## 2014-09-06 ENCOUNTER — Telehealth: Payer: Self-pay | Admitting: Internal Medicine

## 2014-09-06 ENCOUNTER — Telehealth: Payer: Self-pay

## 2014-09-06 DIAGNOSIS — G894 Chronic pain syndrome: Secondary | ICD-10-CM

## 2014-09-06 MED ORDER — OXYCODONE HCL 15 MG PO TABS: 15.0000 mg | ORAL_TABLET | Freq: Four times a day (QID) | ORAL | Status: DC | PRN

## 2014-09-06 NOTE — Telephone Encounter (Signed)
Refill request for oxycodone 15mg . LOV 08/30/2014. Please advise. Thanks!

## 2014-09-06 NOTE — Telephone Encounter (Signed)
Phone number updated.

## 2014-09-06 NOTE — Telephone Encounter (Signed)
Prescription written for Oxycodone 15 mg #60 tabs. NCCSRS reviewed and no inconsistencies noted.

## 2014-09-11 ENCOUNTER — Ambulatory Visit: Payer: Medicare Other | Admitting: Family Medicine

## 2014-09-19 ENCOUNTER — Telehealth: Payer: Self-pay | Admitting: Internal Medicine

## 2014-09-19 DIAGNOSIS — G894 Chronic pain syndrome: Secondary | ICD-10-CM

## 2014-09-19 NOTE — Telephone Encounter (Signed)
Refill request for oxycodone 15mg . LOV 08/30/2014

## 2014-09-21 MED ORDER — OXYCODONE HCL 15 MG PO TABS: 15.0000 mg | ORAL_TABLET | Freq: Four times a day (QID) | ORAL | Status: DC | PRN

## 2014-09-21 NOTE — Telephone Encounter (Signed)
Prescription written for Oxycodone 15 mg #60 tabs which was filled on 09/11/2014 so next prescription is due on 09/01/2014. NCCSRS reviewed and no inconsistencies noted.

## 2014-10-03 ENCOUNTER — Telehealth: Payer: Self-pay | Admitting: Internal Medicine

## 2014-10-03 DIAGNOSIS — G894 Chronic pain syndrome: Secondary | ICD-10-CM

## 2014-10-03 NOTE — Telephone Encounter (Signed)
Refill request for oxycodone 15mg . LOV 08/30/2014. Please advise. Thanks!

## 2014-10-09 MED ORDER — OXYCODONE HCL 15 MG PO TABS: 15.0000 mg | ORAL_TABLET | Freq: Four times a day (QID) | ORAL | Status: DC | PRN

## 2014-10-09 NOTE — Telephone Encounter (Signed)
Prescription written for Oxycodone 15 mg #60 tabs. NCCSRS reviewed and no inconsistencies noted.

## 2014-10-10 ENCOUNTER — Other Ambulatory Visit: Payer: Self-pay | Admitting: Internal Medicine

## 2014-10-14 DIAGNOSIS — Z882 Allergy status to sulfonamides status: Secondary | ICD-10-CM | POA: Diagnosis not present

## 2014-10-14 DIAGNOSIS — D5700 Hb-SS disease with crisis, unspecified: Secondary | ICD-10-CM | POA: Diagnosis not present

## 2014-10-14 DIAGNOSIS — Z79899 Other long term (current) drug therapy: Secondary | ICD-10-CM | POA: Diagnosis not present

## 2014-10-14 DIAGNOSIS — D571 Sickle-cell disease without crisis: Secondary | ICD-10-CM | POA: Diagnosis not present

## 2014-10-17 ENCOUNTER — Telehealth: Payer: Self-pay | Admitting: Internal Medicine

## 2014-10-17 NOTE — Telephone Encounter (Signed)
Refill request for Oxycodone 15mg . LOV 08/30/2014. Please advise. Thanks!

## 2014-10-23 ENCOUNTER — Encounter: Payer: Self-pay | Admitting: Hematology

## 2014-10-23 NOTE — Progress Notes (Signed)
Patient ID: Alejandro Soto, male   DOB: May 17, 1986, 29 y.o.   MRN: [id number] Patient in today expecting to P/U prescription for Oyxcodone.  Spoke with patient at length and advised that he was in violation of his Narcotic contract.  Patient verbalized understanding and remembers signing the contract stating Dr. Zigmund Daniel would be the only physician writing narcotic prescriptions.  I explained to patient that the prescription he received from Dr. Zigmund Daniel on 10/09/2014 was for 20 pills and should last until 10/24/2014 if he is taking the medication as prescribed.  Explained to patient that he also seen a physician down in [place] and got a prescription for an additional 20 pills on 10/15/2014.  Patient states he was down visiting family and got sick.  I explained that if patient needs to seek care elsewhere, just notify the physician's office so they are aware.  Patient states he thought he threw the prescription in the trash can, and he thinks someone else may have filled the prescription.  I advised patient that if that was the case, he needed to file a police report in that county, and give [place] the report number.  Patient has an appointment on 10/30/2014 that he was encouraged to keep.  Patient verbalized understanding of conversation, and was able to recap information discussed.

## 2014-10-29 ENCOUNTER — Telehealth (HOSPITAL_COMMUNITY): Payer: Self-pay

## 2014-10-29 DIAGNOSIS — D571 Sickle-cell disease without crisis: Secondary | ICD-10-CM | POA: Diagnosis not present

## 2014-10-29 NOTE — Telephone Encounter (Signed)
Patient called complaining of back and leg pain 8/10/ Last pain medication taken yesterday. Denies fever, chest pain, nausea/ vomiting or diarrhea. Told patient I would notify MD and called him back. Patient verbalized understanding.

## 2014-10-29 NOTE — Telephone Encounter (Signed)
Spoke With MD regarding M. Cowans phone call, MD to call patient back.

## 2014-10-30 ENCOUNTER — Ambulatory Visit (INDEPENDENT_AMBULATORY_CARE_PROVIDER_SITE_OTHER): Payer: Medicare Other | Admitting: Family Medicine

## 2014-10-30 VITALS — BP 124/89 | HR 74 | Temp 98.4°F | Resp 16 | Ht 67.0 in | Wt 127.0 lb

## 2014-10-30 DIAGNOSIS — F17210 Nicotine dependence, cigarettes, uncomplicated: Secondary | ICD-10-CM

## 2014-10-30 DIAGNOSIS — F17200 Nicotine dependence, unspecified, uncomplicated: Secondary | ICD-10-CM | POA: Diagnosis not present

## 2014-10-30 DIAGNOSIS — G894 Chronic pain syndrome: Secondary | ICD-10-CM | POA: Diagnosis not present

## 2014-10-30 DIAGNOSIS — D571 Sickle-cell disease without crisis: Secondary | ICD-10-CM

## 2014-10-30 LAB — POCT URINALYSIS DIP (DEVICE)
Bilirubin Urine: NEGATIVE
GLUCOSE, UA: NEGATIVE mg/dL
Hgb urine dipstick: NEGATIVE
Leukocytes, UA: NEGATIVE
NITRITE: NEGATIVE
Protein, ur: NEGATIVE mg/dL
Specific Gravity, Urine: 1.01 (ref 1.005–1.030)
UROBILINOGEN UA: 0.2 mg/dL (ref 0.0–1.0)
pH: 5.5 (ref 5.0–8.0)

## 2014-10-30 MED ORDER — IBUPROFEN 600 MG PO TABS: 600.0000 mg | ORAL_TABLET | Freq: Three times a day (TID) | ORAL | Status: DC | PRN

## 2014-10-30 MED ORDER — OXYCODONE HCL 15 MG PO TABS: 15.0000 mg | ORAL_TABLET | Freq: Four times a day (QID) | ORAL | Status: DC | PRN

## 2014-10-30 NOTE — Progress Notes (Signed)
Subjective:    Patient ID: [name], male    DOB: 1985/06/30, 29 y.o.   MRN: [id number]  HPI Mr. Ryland Tungate, a 29 year old male with a history of sickle cell anemia, HbSS presents for follow up of sickle cell anemia and chronic pain. Mr. Heyward states that he currently has pain to his back, upper and lower extremities. Patient reports that he is currently out of pain medication. He reports that he was evaluated in the emergency department in [place] due to the fact that he was temporarily living there for 1 month. He states that he was given a 4 day prescription of Percocet for an ongoing headache and was unable to get back to [place] due to transportation constraints. Current pain intensity is  6/10 to back and lower extremities. He states that he has not utilized medication interventions in several days.  Mr. Peart denies headache, chest pains, shortness of breath, fatigue, nausea, vomiting or diarrhea.   Past Medical History  Diagnosis Date  . Sickle cell anemia   . Pneumonia    History   Social History  . Marital Status: Single    Spouse Name: N/A  . Number of Children: N/A  . Years of Education: N/A   Occupational History  . Not on file.   Social History Main Topics  . Smoking status: Current Some Day Smoker -- 0.50 packs/day for 5 years    Types: Cigarettes  . Smokeless tobacco: Never Used  . Alcohol Use: No  . Drug Use: No  . Sexual Activity: Yes   Other Topics Concern  . Not on file   Social History Narrative    Review of Systems  Constitutional: Negative.   HENT: Negative.   Eyes: Negative.   Respiratory: Negative.   Cardiovascular: Negative.   Gastrointestinal: Negative.   Endocrine: Negative.   Genitourinary: Negative.   Musculoskeletal: Positive for myalgias (low extremity pain) and back pain.  Skin: Negative.   Allergic/Immunologic: Negative.   Neurological: Negative.   Hematological: Negative.   Psychiatric/Behavioral: Negative.        Objective:   Physical Exam  Constitutional: He is oriented to person, place, and time. He appears well-developed and well-nourished.  HENT:  Head: Normocephalic and atraumatic.  Right Ear: External ear normal.  Left Ear: External ear normal.  Nose: Nose normal.  Mouth/Throat: Oropharynx is clear and moist.  Eyes: Conjunctivae and EOM are normal. Pupils are equal, round, and reactive to light.  Neck: Normal range of motion. Neck supple.  Cardiovascular: Normal rate, regular rhythm, normal heart sounds and intact distal pulses.   Pulmonary/Chest: Effort normal and breath sounds normal.  Abdominal: Soft. Bowel sounds are normal.  Musculoskeletal: Normal range of motion.  Neurological: He is alert and oriented to person, place, and time. He has normal reflexes.  Skin: Skin is warm and dry.  Psychiatric: He has a normal mood and affect. His behavior is normal. Judgment and thought content normal.       BP 124/89 mmHg  Pulse 74  Temp(Src) 98.4 F (36.9 C) (Oral)  Resp 16  Ht 5\' 7"  (1.702 m)  Wt 127 lb (57.607 kg)  BMI 19.89 kg/m2 Assessment & Plan:   1. Sickle cell disease, type SS Sickle cell disease - Continue Hydrea 1500 mg, 30 mg/kg/day. Discussed possibility of increasing.  Will check CBC w/differential today. Discussed possible increase of Hydrea.  We discussed the need for good hydration, monitoring of hydration status, avoidance of heat, cold, stress, and  infection triggers. We discussed the risks and benefits of Hydrea, including bone marrow suppression, the possibility of GI upset, skin ulcers, hair thinning, and teratogenicity. The patient was reminded of the need to seek medical attention of any symptoms of bleeding, anemia, or infection. Continue folic acid 1 mg daily to prevent aplastic bone marrow crises.   Pulmonary evaluation - Patient denies severe recurrent wheezes, shortness of breath with exercise, or persistent cough. If these symptoms develop, pulmonary function  tests with spirometry will be ordered, and if abnormal, plan on referral to Pulmonology for further evaluation.  Cardiac - Routine screening for pulmonary hypertension is not recommended.  Eye - High risk of proliferative retinopathy. Annual eye exam with retinal exam recommended to patient. Sent referral, patient has missed last several appointments.    Immunization status - Mr. Sanderson is up to date with vaccinations.  - ibuprofen (ADVIL,MOTRIN) 600 MG tablet; Take 1 tablet (600 mg total) by mouth every 8 (eight) hours as needed for mild pain or moderate pain.  Dispense: 30 tablet; Refill: 1 - POCT urinalysis dipstick - CBC with Differential - COMPLETE METABOLIC PANEL WITH GFR - POCT urinalysis dip (device)   2. Chronic pain syndrome  We agreed on weaning his current medication dose. We discussed that Mr. Bos  Schedule II prescriptions only from [place]. Reviewed [place] Substance Reporting System, discussed Rx from 10/17/2013 for Percocet 5-325 mg every 6 hours prn #20. Patient reports that he was evaluated in the emergency department in [place]. He states that he was unable to get to primary physician in [place] due to transportation constraints. Pt is also aware that the prescription history is available to [place] online through the [facility] CSRS. Controlled substance agreement signed. We reminded Mr. Mckey that all patients receiving Schedule II narcotics must be seen for follow within one month of prescription being requested. We reviewed the terms of our pain agreement, including the need to keep medicines in a safe locked location away from children or pets, and the need to report excess sedation or constipation, measures to avoid constipation, and policies related to early refills and stolen prescriptions. According to the [place] Chronic Pain Initiative program, we have reviewed details related to analgesia, adverse effects, aberrant behaviors.   - Prescription Monitoring Profile (17)-[facility] - oxyCODONE  (ROXICODONE) 15 MG immediate release tablet; Take 1 tablet (15 mg total) by mouth every 6 (six) hours as needed for pain.  Dispense: 60 tablet; Refill: 0  3. Tobacco dependence Mr. Gartrell is in the pre-contemplative state. Smoking cessation instruction/counseling given:  counseled patient on the dangers of tobacco use, advised patient to stop smoking, and reviewed strategies to maximize success     Dorena Dew, FNP

## 2014-10-30 NOTE — Patient Instructions (Signed)
Sickle Cell Anemia, Adult °Sickle cell anemia is a condition in which red blood cells have an abnormal "sickle" shape. This abnormal shape shortens the cells' life span, which results in a lower than normal concentration of red blood cells in the blood. The sickle shape also causes the cells to clump together and block free blood flow through the blood vessels. As a result, the tissues and organs of the body do not receive enough oxygen. Sickle cell anemia causes organ damage and pain and increases the risk of infection. °CAUSES  °Sickle cell anemia is a genetic disorder. Those who receive two copies of the gene have the condition, and those who receive one copy have the trait. °RISK FACTORS °The sickle cell gene is most common in people whose families originated in [place]. Other areas of the globe where sickle cell trait occurs include the Mediterranean, [place] and [place], the [place], and the [place].  °SIGNS AND SYMPTOMS °· Pain, especially in the extremities, back, chest, or abdomen (common). The pain may start suddenly or may develop following an illness, especially if there is dehydration. Pain can also occur due to overexertion or exposure to extreme temperature changes. °· Frequent severe bacterial infections, especially certain types of pneumonia and meningitis. °· Pain and swelling in the hands and feet. °· Decreased activity.   °· Loss of appetite.   °· Change in behavior. °· Headaches. °· Seizures. °· Shortness of breath or difficulty breathing. °· Vision changes. °· Skin ulcers. °Those with the trait may not have symptoms or they may have mild symptoms.  °DIAGNOSIS  °Sickle cell anemia is diagnosed with blood tests that demonstrate the genetic trait. It is often diagnosed during the newborn period, due to mandatory testing nationwide. A variety of blood tests, X-rays, CT scans, MRI scans, ultrasounds, and lung function tests may also be done to monitor the condition. °TREATMENT  °Sickle  cell anemia may be treated with: °· Medicines. You may be given pain medicines, antibiotic medicines (to treat and prevent infections) or medicines to increase the production of certain types of hemoglobin. °· Fluids. °· Oxygen. °· Blood transfusions. °HOME CARE INSTRUCTIONS  °· Drink enough fluid to keep your urine clear or pale yellow. Increase your fluid intake in hot weather and during exercise. °· Do not smoke. Smoking lowers oxygen levels in the blood.   °· Only take over-the-counter or prescription medicines for pain, fever, or discomfort as directed by your health care provider. °· Take antibiotics as directed by your health care provider. Make sure you finish them it even if you start to feel better.   °· Take supplements as directed by your health care provider.   °· Consider wearing a medical alert bracelet. This tells anyone caring for you in an emergency of your condition.   °· When traveling, keep your medical information, health care provider's names, and the medicines you take with you at all times.   °· If you develop a fever, do not take medicines to reduce the fever right away. This could cover up a problem that is developing. Notify your health care provider. °· Keep all follow-up appointments with your health care provider. Sickle cell anemia requires regular medical care. °SEEK MEDICAL CARE IF: ° You have a fever. °SEEK IMMEDIATE MEDICAL CARE IF:  °· You feel dizzy or faint.   °· You have new abdominal pain, especially on the left side near the stomach area.   °· You develop a persistent, often uncomfortable and painful penile erection (priapism). If this is not treated immediately it   will lead to impotence.   °· You have numbness your arms or legs or you have a hard time moving them.   °· You have a hard time with speech.   °· You have a fever or persistent symptoms for more than 2-3 days.   °· You have a fever and your symptoms suddenly get worse.   °· You have signs or symptoms of infection.  These include:   °¨ Chills.   °¨ Abnormal tiredness (lethargy).   °¨ Irritability.   °¨ Poor eating.   °¨ Vomiting.   °· You develop pain that is not helped with medicine.   °· You develop shortness of breath. °· You have pain in your chest.   °· You are coughing up pus-like or bloody sputum.   °· You develop a stiff neck. °· Your feet or hands swell or have pain. °· Your abdomen appears bloated. °· You develop joint pain. °MAKE SURE YOU: °· Understand these instructions. °Document Released: 09/16/2005 Document Revised: 10/23/2013 Document Reviewed: 01/18/2013 °Exit[employer]® Patient Information ©2015 [employer]. This information is not intended to replace advice given to you by your health care provider. Make sure you discuss any questions you have with your health care provider. ° °

## 2014-11-02 ENCOUNTER — Encounter: Payer: Self-pay | Admitting: Family Medicine

## 2014-11-26 ENCOUNTER — Telehealth (HOSPITAL_COMMUNITY): Payer: Self-pay

## 2014-11-26 ENCOUNTER — Telehealth: Payer: Self-pay | Admitting: Internal Medicine

## 2014-11-26 NOTE — Telephone Encounter (Signed)
Patient called complaining of Back and leg pain 7/10. Patient states he is out of pain medication and he hasn't taken any pain medication for 1 week. Patient states he is nauseous and has had diarrhea twice this morning. Told patient I will contact MD and call him back.

## 2014-11-26 NOTE — Telephone Encounter (Signed)
Returned Patient call after speaking with MD. Instructed patient that MD wants him to go to the ER since he is having nausea and diarrhea. Also to call primary care and request a refill since he is out of his medication. Phone number given to patient. Patient verbalizes understanding.

## 2014-11-27 NOTE — Telephone Encounter (Signed)
Refill request for oxycodone 15mg . LOV 10/30/2014. Please advise. Thanks!

## 2014-11-27 NOTE — Telephone Encounter (Signed)
Will discuss opiate medication refill during 1 month follow-up scheduled for November 29, 2014.    Dorena Dew, FNP

## 2014-11-29 ENCOUNTER — Ambulatory Visit (INDEPENDENT_AMBULATORY_CARE_PROVIDER_SITE_OTHER): Payer: Medicare Other | Admitting: Family Medicine

## 2014-11-29 VITALS — BP 107/61 | HR 74 | Temp 98.5°F | Resp 16 | Ht 67.0 in | Wt 129.0 lb

## 2014-11-29 DIAGNOSIS — D571 Sickle-cell disease without crisis: Secondary | ICD-10-CM | POA: Diagnosis not present

## 2014-11-29 DIAGNOSIS — D5720 Sickle-cell/Hb-C disease without crisis: Secondary | ICD-10-CM | POA: Diagnosis not present

## 2014-11-29 DIAGNOSIS — F17200 Nicotine dependence, unspecified, uncomplicated: Secondary | ICD-10-CM

## 2014-11-29 DIAGNOSIS — G894 Chronic pain syndrome: Secondary | ICD-10-CM | POA: Diagnosis not present

## 2014-11-29 DIAGNOSIS — F17210 Nicotine dependence, cigarettes, uncomplicated: Secondary | ICD-10-CM | POA: Diagnosis not present

## 2014-11-29 LAB — COMPREHENSIVE METABOLIC PANEL
ALBUMIN: 4.3 g/dL (ref 3.5–5.2)
ALK PHOS: 46 U/L (ref 39–117)
ALT: 35 U/L (ref 0–53)
AST: 45 U/L — ABNORMAL HIGH (ref 0–37)
BILIRUBIN TOTAL: 1.5 mg/dL — AB (ref 0.2–1.2)
BUN: 8 mg/dL (ref 6–23)
CO2: 27 mEq/L (ref 19–32)
CREATININE: 0.75 mg/dL (ref 0.50–1.35)
Calcium: 9.8 mg/dL (ref 8.4–10.5)
Chloride: 102 mEq/L (ref 96–112)
Glucose, Bld: 80 mg/dL (ref 70–99)
Potassium: 4.5 mEq/L (ref 3.5–5.3)
Sodium: 137 mEq/L (ref 135–145)
Total Protein: 7.3 g/dL (ref 6.0–8.3)

## 2014-11-29 LAB — LACTATE DEHYDROGENASE: LDH: 326 U/L — ABNORMAL HIGH (ref 94–250)

## 2014-11-29 MED ORDER — OXYCODONE HCL 15 MG PO TABS: 15.0000 mg | ORAL_TABLET | Freq: Four times a day (QID) | ORAL | Status: DC | PRN

## 2014-11-29 NOTE — Progress Notes (Signed)
Subjective:    Patient ID: Alejandro Soto, male    DOB: [birth year], 29 y.o.   MRN: [id number]  HPI [name], a 29 year old male with a history of sickle cell anemia, HbSS presents for follow up of sickle cell anemia and chronic pain. [name] states that he currently has pain to his back, upper and lower extremities. Patient maintains that current pain intensity is 6/10 described as constant and aching. Patient reports that he is currently out of pain medication.  [name] denies headache, chest pains, shortness of breath, fatigue, nausea, vomiting or diarrhea.   Past Medical History  Diagnosis Date  . Sickle cell anemia   . Pneumonia    History   Social History  . Marital Status: Single    Spouse Name: N/A  . Number of Children: N/A  . Years of Education: N/A   Occupational History  . Not on file.   Social History Main Topics  . Smoking status: Current Some Day Smoker -- 0.50 packs/day for 5 years    Types: Cigarettes  . Smokeless tobacco: Never Used  . Alcohol Use: No  . Drug Use: No  . Sexual Activity: Yes   Other Topics Concern  . Not on file   Social History Narrative    Review of Systems  Constitutional: Negative.   HENT: Negative.   Eyes: Negative.   Respiratory: Negative.   Cardiovascular: Negative.   Gastrointestinal: Negative.   Endocrine: Negative.   Genitourinary: Negative.   Musculoskeletal: Positive for myalgias (low extremity pain) and back pain.  Skin: Negative.   Allergic/Immunologic: Negative.   Neurological: Negative.   Hematological: Negative.   Psychiatric/Behavioral: Negative.       Objective:   Physical Exam  Constitutional: He is oriented to person, place, and time. He appears well-developed and well-nourished.  HENT:  Head: Normocephalic and atraumatic.  Right Ear: External ear normal.  Left Ear: External ear normal.  Nose: Nose normal.  Mouth/Throat: Oropharynx is clear and moist.  Eyes: Conjunctivae and EOM are  normal. Pupils are equal, round, and reactive to light.  Neck: Normal range of motion. Neck supple.  Cardiovascular: Normal rate, regular rhythm, normal heart sounds and intact distal pulses.   Pulmonary/Chest: Effort normal and breath sounds normal.  Abdominal: Soft. Bowel sounds are normal.  Musculoskeletal: Normal range of motion.  Neurological: He is alert and oriented to person, place, and time. He has normal reflexes.  Skin: Skin is warm and dry.  Psychiatric: He has a normal mood and affect. His behavior is normal. Judgment and thought content normal.       BP 107/61 mmHg  Pulse 74  Temp(Src) 98.5 F (36.9 C) (Oral)  Resp 16  Ht 5\' 7"  (1.702 m)  Wt 129 lb (58.514 kg)  BMI 20.20 kg/m2 Assessment & Plan:   1. Sickle cell disease, type SS Sickle cell disease - Continue Hydrea 1500 mg, 30 mg/kg/day. Discussed possibility of increasing.  Will check CBC w/differential today. Discussed possible increase of Hydrea.  We discussed the need for good hydration, monitoring of hydration status, avoidance of heat, cold, stress, and infection triggers. We discussed the risks and benefits of Hydrea, including bone marrow suppression, the possibility of GI upset, skin ulcers, hair thinning, and teratogenicity. The patient was reminded of the need to seek medical attention of any symptoms of bleeding, anemia, or infection. Continue folic acid 1 mg daily to prevent aplastic bone marrow crises.   Pulmonary evaluation - Patient denies  severe recurrent wheezes, shortness of breath with exercise, or persistent cough. If these symptoms develop, pulmonary function tests with spirometry will be ordered, and if abnormal, plan on referral to Pulmonology for further evaluation.  Cardiac - Routine screening for pulmonary hypertension is not recommended.  Eye - High risk of proliferative retinopathy. Annual eye exam with retinal exam recommended to patient. Sent referral, patient has missed last several  appointments.    Immunization status - Alejandro Soto is up to date with vaccinations.  - oxyCODONE (ROXICODONE) 15 MG immediate release tablet; Take 1 tablet (15 mg total) by mouth every 6 (six) hours as needed for pain.  Dispense: 90 tablet; Refill: 0 - CBC with Differential/Platelet - Reticulocytes - Comprehensive metabolic panel - Lactate dehydrogenase    2. Chronic pain syndrome  We agreed on weaning his current medication dose. We discussed that Alejandro Soto  Schedule II prescriptions only from [place]. Reviewed [place] Substance Reporting System, discussed Rx from for Percocet 5-325 mg every 6 hours prn # 90. Patient reports that he was evaluated in the emergency department in [place]. He states that he was unable to get to primary physician in [place] due to transportation constraints. Pt is also aware that the prescription history is available to [place] online through the [facility] CSRS. Controlled substance agreement signed. We reminded Alejandro Soto that all patients receiving Schedule II narcotics must be seen for follow within one month of prescription being requested. We reviewed the terms of our pain agreement, including the need to keep medicines in a safe locked location away from children or pets, and the need to report excess sedation or constipation, measures to avoid constipation, and policies related to early refills and stolen prescriptions. According to the [place] Chronic Pain Initiative program, we have reviewed details related to analgesia, adverse effects, aberrant behaviors. Patient was previously referred to pain management.   - oxyCODONE (ROXICODONE) 15 MG immediate release tablet; Take 1 tablet (15 mg total) by mouth every 6 (six) hours as needed for pain.  Dispense: 90 tablet; Refill: 0   3. Tobacco dependence Alejandro Soto is in the pre-contemplative state. Smoking cessation instruction/counseling given:  counseled patient on the dangers of tobacco use, advised patient to stop smoking, and reviewed  strategies to maximize success     RTC: Patient will follow up in 1 month Follow up in 3 months with Dr. Zigmund Daniel for sickle cell anemia Dorena Dew, FNP

## 2014-11-29 NOTE — Patient Instructions (Signed)
Sickle Cell Anemia Sickle cell anemia is a condition where your red blood cells are shaped like sickles. Red blood cells carry oxygen through the body. Sickle-shaped red blood cells do not live as long as normal red blood cells. They also clump together and block blood from flowing through the blood vessels. These things prevent the body from getting enough oxygen. Sickle cell anemia causes organ damage and pain. It also increases the risk of infection. HOME CARE  Drink enough fluid to keep your pee (urine) clear or pale yellow. Drink more in hot weather and during exercise.  Do not smoke. Smoking lowers oxygen levels in the blood.  Only take over-the-counter or prescription medicines as told by your doctor.  Take antibiotic medicines as told by your doctor. Make sure you finish them even if you start to feel better.  Take supplements as told by your doctor.  Consider wearing a medical alert bracelet. This tells anyone caring for you in an emergency of your condition.  When traveling, keep your medical information, doctors' names, and the medicines you take with you at all times.  If you have a fever, do not take fever medicines right away. This could cover up a problem. Tell your doctor.   Keep all follow-up visits with your doctor. Sickle cell anemia requires regular medical care. GET HELP IF: You have a fever. GET HELP RIGHT AWAY IF:  You feel dizzy or faint.  You have new belly (abdominal) pain, especially on the left side near the stomach area.  You have a lasting, often uncomfortable and painful erection of the penis (priapism). If it is not treated right away, you will become unable to have sex (impotence).  You have numbness in your arms or legs or you have a hard time moving them.  You have a hard time talking.  You have a fever or lasting symptoms for more than 2-3 days.  You have a fever and your symptoms suddenly get worse.  You have signs or symptoms of infection.  These include:  Chills.  Being more tired than normal (lethargy).  Irritability.  Poor eating.  Throwing up (vomiting).  You have pain that is not helped with medicine.  You have shortness of breath.  You have pain in your chest.  You are coughing up pus-like or bloody mucus.  You have a stiff neck.  Your feet or hands swell or have pain.  Your belly looks bloated.  Your joints hurt. MAKE SURE YOU:  Understand these instructions.  Will watch your condition.  Will get help right away if you are not doing well or get worse. Document Released: 03/29/2013 Document Revised: 10/23/2013 Document Reviewed: 03/29/2013 [facility] Patient Information 2015 [place], [place]. This information is not intended to replace advice given to you by your health care provider. Make sure you discuss any questions you have with your health care provider. Chronic Pain Chronic pain can be defined as pain that is off and on and lasts for 3-6 months or longer. Many things cause chronic pain, which can make it difficult to make a diagnosis. There are many treatment options available for chronic pain. However, finding a treatment that works well for you may require trying various approaches until the right one is found. Many people benefit from a combination of two or more types of treatment to control their pain. SYMPTOMS  Chronic pain can occur anywhere in the body and can range from mild to very severe. Some types of chronic pain include:  Headache.  Low back pain.  Cancer pain.  Arthritis pain.  Neurogenic pain. This is pain resulting from damage to nerves. People with chronic pain may also have other symptoms such as:  Depression.  Anger.  Insomnia.  Anxiety. DIAGNOSIS  Your health care provider will help diagnose your condition over time. In many cases, the initial focus will be on excluding possible conditions that could be causing the pain. Depending on your symptoms, your health  care provider may order tests to diagnose your condition. Some of these tests may include:   Blood tests.   CT scan.   MRI.   X-rays.   Ultrasounds.   Nerve conduction studies.  You may need to see a specialist.  TREATMENT  Finding treatment that works well may take time. You may be referred to a pain specialist. He or she may prescribe medicine or therapies, such as:   Mindful meditation or yoga.  Shots (injections) of numbing or pain-relieving medicines into the spine or area of pain.  Local electrical stimulation.  Acupuncture.   Massage therapy.   Aroma, color, light, or sound therapy.   Biofeedback.   Working with a physical therapist to keep from getting stiff.   Regular, gentle exercise.   Cognitive or behavioral therapy.   Group support.  Sometimes, surgery may be recommended.  HOME CARE INSTRUCTIONS   Take all medicines as directed by your health care provider.   Lessen stress in your life by relaxing and doing things such as listening to calming music.   Exercise or be active as directed by your health care provider.   Eat a healthy diet and include things such as vegetables, fruits, fish, and lean meats in your diet.   Keep all follow-up appointments with your health care provider.   Attend a support group with others suffering from chronic pain. SEEK MEDICAL CARE IF:   Your pain gets worse.   You develop a new pain that was not there before.   You cannot tolerate medicines given to you by your health care provider.   You have new symptoms since your last visit with your health care provider.  SEEK IMMEDIATE MEDICAL CARE IF:   You feel weak.   You have decreased sensation or numbness.   You lose control of bowel or bladder function.   Your pain suddenly gets much worse.   You develop shaking.  You develop chills.  You develop confusion.  You develop chest pain.  You develop shortness of breath.   MAKE SURE YOU:  Understand these instructions.  Will watch your condition.  Will get help right away if you are not doing well or get worse. Document Released: 02/28/2002 Document Revised: 02/08/2013 Document Reviewed: 12/02/2012 [facility] Patient Information 2015 [place], [place]. This information is not intended to replace advice given to you by your health care provider. Make sure you discuss any questions you have with your health care provider.

## 2014-11-30 ENCOUNTER — Encounter: Payer: Self-pay | Admitting: Family Medicine

## 2014-11-30 LAB — CBC WITH DIFFERENTIAL/PLATELET
Basophils Absolute: 0 10*3/uL (ref 0.0–0.1)
Basophils Relative: 0 % (ref 0–1)
Eosinophils Absolute: 0.2 10*3/uL (ref 0.0–0.7)
Eosinophils Relative: 2 % (ref 0–5)
HEMATOCRIT: 26.9 % — AB (ref 39.0–52.0)
HEMOGLOBIN: 8.9 g/dL — AB (ref 13.0–17.0)
LYMPHS PCT: 45 % (ref 12–46)
Lymphs Abs: 5.2 10*3/uL — ABNORMAL HIGH (ref 0.7–4.0)
MCH: 32.1 pg (ref 26.0–34.0)
MCHC: 33.1 g/dL (ref 30.0–36.0)
MCV: 97.1 fL (ref 78.0–100.0)
MONO ABS: 1.2 10*3/uL — AB (ref 0.1–1.0)
MONOS PCT: 10 % (ref 3–12)
MPV: 9.2 fL (ref 8.6–12.4)
NEUTROS ABS: 5 10*3/uL (ref 1.7–7.7)
NEUTROS PCT: 43 % (ref 43–77)
Platelets: 593 10*3/uL — ABNORMAL HIGH (ref 150–400)
RBC: 2.77 MIL/uL — ABNORMAL LOW (ref 4.22–5.81)
RDW: 20 % — ABNORMAL HIGH (ref 11.5–15.5)
WBC: 11.6 10*3/uL — ABNORMAL HIGH (ref 4.0–10.5)

## 2014-11-30 LAB — RETICULOCYTES
ABS RETIC: 185.6 10*3/uL (ref 19.0–186.0)
RBC.: 2.77 MIL/uL — AB (ref 4.22–5.81)
Retic Ct Pct: 6.7 % — ABNORMAL HIGH (ref 0.4–2.3)

## 2014-12-16 DIAGNOSIS — D5700 Hb-SS disease with crisis, unspecified: Secondary | ICD-10-CM | POA: Diagnosis present

## 2014-12-16 DIAGNOSIS — D72829 Elevated white blood cell count, unspecified: Secondary | ICD-10-CM | POA: Diagnosis not present

## 2014-12-16 DIAGNOSIS — R109 Unspecified abdominal pain: Secondary | ICD-10-CM | POA: Diagnosis not present

## 2014-12-16 DIAGNOSIS — Z87891 Personal history of nicotine dependence: Secondary | ICD-10-CM | POA: Diagnosis not present

## 2014-12-16 DIAGNOSIS — Z881 Allergy status to other antibiotic agents status: Secondary | ICD-10-CM | POA: Diagnosis not present

## 2014-12-16 DIAGNOSIS — K8020 Calculus of gallbladder without cholecystitis without obstruction: Secondary | ICD-10-CM | POA: Diagnosis not present

## 2014-12-16 DIAGNOSIS — Z79899 Other long term (current) drug therapy: Secondary | ICD-10-CM | POA: Diagnosis not present

## 2014-12-17 DIAGNOSIS — D5700 Hb-SS disease with crisis, unspecified: Secondary | ICD-10-CM | POA: Diagnosis not present

## 2014-12-17 DIAGNOSIS — R109 Unspecified abdominal pain: Secondary | ICD-10-CM | POA: Diagnosis not present

## 2014-12-17 DIAGNOSIS — K8020 Calculus of gallbladder without cholecystitis without obstruction: Secondary | ICD-10-CM | POA: Diagnosis not present

## 2014-12-17 DIAGNOSIS — D72829 Elevated white blood cell count, unspecified: Secondary | ICD-10-CM | POA: Diagnosis not present

## 2014-12-24 ENCOUNTER — Encounter (HOSPITAL_COMMUNITY): Payer: Self-pay

## 2014-12-24 ENCOUNTER — Inpatient Hospital Stay (HOSPITAL_COMMUNITY): Payer: Medicare Other

## 2014-12-24 ENCOUNTER — Inpatient Hospital Stay (HOSPITAL_COMMUNITY)
Admission: EM | Admit: 2014-12-24 | Discharge: 2014-12-26 | DRG: 812 | Disposition: A | Discharge status: Home / Self Care | Payer: Medicare Other | Attending: Internal Medicine | Admitting: Internal Medicine

## 2014-12-24 DIAGNOSIS — D582 Other hemoglobinopathies: Secondary | ICD-10-CM | POA: Diagnosis not present

## 2014-12-24 DIAGNOSIS — Z833 Family history of diabetes mellitus: Secondary | ICD-10-CM | POA: Diagnosis not present

## 2014-12-24 DIAGNOSIS — G894 Chronic pain syndrome: Secondary | ICD-10-CM | POA: Diagnosis present

## 2014-12-24 DIAGNOSIS — Z809 Family history of malignant neoplasm, unspecified: Secondary | ICD-10-CM | POA: Diagnosis not present

## 2014-12-24 DIAGNOSIS — D72829 Elevated white blood cell count, unspecified: Secondary | ICD-10-CM | POA: Diagnosis present

## 2014-12-24 DIAGNOSIS — R52 Pain, unspecified: Secondary | ICD-10-CM | POA: Diagnosis present

## 2014-12-24 DIAGNOSIS — D638 Anemia in other chronic diseases classified elsewhere: Secondary | ICD-10-CM | POA: Diagnosis present

## 2014-12-24 DIAGNOSIS — R1013 Epigastric pain: Secondary | ICD-10-CM | POA: Diagnosis present

## 2014-12-24 DIAGNOSIS — F17210 Nicotine dependence, cigarettes, uncomplicated: Secondary | ICD-10-CM | POA: Diagnosis present

## 2014-12-24 DIAGNOSIS — D571 Sickle-cell disease without crisis: Secondary | ICD-10-CM

## 2014-12-24 DIAGNOSIS — Z825 Family history of asthma and other chronic lower respiratory diseases: Secondary | ICD-10-CM

## 2014-12-24 DIAGNOSIS — D735 Infarction of spleen: Secondary | ICD-10-CM | POA: Diagnosis not present

## 2014-12-24 DIAGNOSIS — D649 Anemia, unspecified: Secondary | ICD-10-CM | POA: Diagnosis present

## 2014-12-24 DIAGNOSIS — D5700 Hb-SS disease with crisis, unspecified: Secondary | ICD-10-CM | POA: Diagnosis not present

## 2014-12-24 LAB — CBC WITH DIFFERENTIAL/PLATELET
Basophils Absolute: 0 10*3/uL (ref 0.0–0.1)
Basophils Relative: 0 % (ref 0–1)
Eosinophils Absolute: 0.4 10*3/uL (ref 0.0–0.7)
Eosinophils Relative: 2 % (ref 0–5)
HCT: 27.8 % — ABNORMAL LOW (ref 39.0–52.0)
Hemoglobin: 9.6 g/dL — ABNORMAL LOW (ref 13.0–17.0)
Lymphocytes Relative: 28 % (ref 12–46)
Lymphs Abs: 5.3 10*3/uL — ABNORMAL HIGH (ref 0.7–4.0)
MCH: 31 pg (ref 26.0–34.0)
MCHC: 34.5 g/dL (ref 30.0–36.0)
MCV: 89.7 fL (ref 78.0–100.0)
Monocytes Absolute: 1.7 10*3/uL — ABNORMAL HIGH (ref 0.1–1.0)
Monocytes Relative: 9 % (ref 3–12)
Neutro Abs: 11.5 10*3/uL — ABNORMAL HIGH (ref 1.7–7.7)
Neutrophils Relative %: 61 % (ref 43–77)
Platelets: 325 10*3/uL (ref 150–400)
RBC: 3.1 MIL/uL — ABNORMAL LOW (ref 4.22–5.81)
RDW: 17.6 % — ABNORMAL HIGH (ref 11.5–15.5)
WBC: 18.9 10*3/uL — ABNORMAL HIGH (ref 4.0–10.5)

## 2014-12-24 LAB — AMYLASE: AMYLASE: 82 U/L (ref 28–100)

## 2014-12-24 LAB — RETICULOCYTES
RBC.: 3.1 MIL/uL — ABNORMAL LOW (ref 4.22–5.81)
Retic Count, Absolute: 328.6 10*3/uL — ABNORMAL HIGH (ref 19.0–186.0)
Retic Ct Pct: 10.6 % — ABNORMAL HIGH (ref 0.4–3.1)

## 2014-12-24 LAB — COMPREHENSIVE METABOLIC PANEL
ALT: 34 U/L (ref 17–63)
AST: 46 U/L — ABNORMAL HIGH (ref 15–41)
Albumin: 4.4 g/dL (ref 3.5–5.0)
Alkaline Phosphatase: 51 U/L (ref 38–126)
Anion gap: 7 (ref 5–15)
BUN: 11 mg/dL (ref 6–20)
CO2: 28 mmol/L (ref 22–32)
Calcium: 9.5 mg/dL (ref 8.9–10.3)
Chloride: 106 mmol/L (ref 101–111)
Creatinine, Ser: 0.59 mg/dL — ABNORMAL LOW (ref 0.61–1.24)
GFR calc Af Amer: >60 mL/min (ref >60)
GFR calc non Af Amer: >60 mL/min (ref >60)
Glucose, Bld: 101 mg/dL — ABNORMAL HIGH (ref 65–99)
Potassium: 3.9 mmol/L (ref 3.5–5.1)
Sodium: 141 mmol/L (ref 135–145)
Total Bilirubin: 1 mg/dL (ref 0.3–1.2)
Total Protein: 8 g/dL (ref 6.5–8.1)

## 2014-12-24 LAB — MAGNESIUM: Magnesium: 1.7 mg/dL (ref 1.7–2.4)

## 2014-12-24 LAB — LACTIC ACID, PLASMA: Lactic Acid, Venous: 1 mmol/L (ref 0.5–2.0)

## 2014-12-24 LAB — LIPASE, BLOOD: LIPASE: 45 U/L (ref 22–51)

## 2014-12-24 LAB — LACTATE DEHYDROGENASE: LDH: 286 U/L — AB (ref 98–192)

## 2014-12-24 MED ORDER — ACETAMINOPHEN 650 MG RE SUPP: 650.0000 mg | RECTAL | Status: DC | PRN

## 2014-12-24 MED ORDER — ACETAMINOPHEN 325 MG PO TABS: 650.0000 mg | ORAL_TABLET | ORAL | Status: DC | PRN

## 2014-12-24 MED ORDER — GABAPENTIN 300 MG PO CAPS
300.0000 mg | ORAL_CAPSULE | Freq: Two times a day (BID) | ORAL | Status: DC
  Administered 2014-12-24 – 2014-12-26 (×4): 300 mg via ORAL
  Filled 2014-12-24 (×5): qty 1

## 2014-12-24 MED ORDER — ONDANSETRON HCL 4 MG/2ML IJ SOLN: 4.0000 mg | INTRAMUSCULAR | Status: DC | PRN

## 2014-12-24 MED ORDER — POLYETHYLENE GLYCOL 3350 17 G PO PACK
17.0000 g | PACK | Freq: Every day | ORAL | Status: DC | PRN
  Filled 2014-12-24: qty 1

## 2014-12-24 MED ORDER — HYDROMORPHONE HCL 1 MG/ML IJ SOLN: 0.5000 mg | INTRAMUSCULAR | Status: DC | PRN

## 2014-12-24 MED ORDER — SENNOSIDES-DOCUSATE SODIUM 8.6-50 MG PO TABS
1.0000 | ORAL_TABLET | Freq: Two times a day (BID) | ORAL | Status: DC
  Administered 2014-12-24 – 2014-12-26 (×4): 1 via ORAL
  Filled 2014-12-24 (×4): qty 1

## 2014-12-24 MED ORDER — NALOXONE HCL 0.4 MG/ML IJ SOLN: 0.4000 mg | INTRAMUSCULAR | Status: DC | PRN

## 2014-12-24 MED ORDER — SODIUM CHLORIDE 0.9 % IJ SOLN: 9.0000 mL | INTRAMUSCULAR | Status: DC | PRN

## 2014-12-24 MED ORDER — FOLIC ACID 1 MG PO TABS: 1.0000 mg | ORAL_TABLET | Freq: Every day | ORAL | Status: DC

## 2014-12-24 MED ORDER — FOLIC ACID 1 MG PO TABS
1.0000 mg | ORAL_TABLET | Freq: Every day | ORAL | Status: DC
  Administered 2014-12-25 – 2014-12-26 (×3): 1 mg via ORAL
  Filled 2014-12-24 (×3): qty 1

## 2014-12-24 MED ORDER — SODIUM CHLORIDE 0.45 % IV SOLN
INTRAVENOUS | Status: DC
  Administered 2014-12-24: 1000 mL via INTRAVENOUS
  Administered 2014-12-25: 22:00:00 via INTRAVENOUS

## 2014-12-24 MED ORDER — ONDANSETRON HCL 4 MG/2ML IJ SOLN: 4.0000 mg | Freq: Four times a day (QID) | INTRAMUSCULAR | Status: DC | PRN

## 2014-12-24 MED ORDER — HYDROMORPHONE HCL 1 MG/ML IJ SOLN
1.0000 mg | Freq: Once | INTRAMUSCULAR | Status: AC
  Administered 2014-12-24: 1 mg via INTRAVENOUS
  Filled 2014-12-24: qty 1

## 2014-12-24 MED ORDER — FAMOTIDINE IN NACL 20-0.9 MG/50ML-% IV SOLN
20.0000 mg | Freq: Two times a day (BID) | INTRAVENOUS | Status: DC
  Filled 2014-12-24 (×2): qty 50

## 2014-12-24 MED ORDER — ONDANSETRON HCL 4 MG PO TABS: 4.0000 mg | ORAL_TABLET | ORAL | Status: DC | PRN

## 2014-12-24 MED ORDER — FAMOTIDINE 20 MG PO TABS
20.0000 mg | ORAL_TABLET | Freq: Two times a day (BID) | ORAL | Status: DC
  Administered 2014-12-24 – 2014-12-26 (×4): 20 mg via ORAL
  Filled 2014-12-24 (×5): qty 1

## 2014-12-24 MED ORDER — SODIUM CHLORIDE 0.9 % IV BOLUS (SEPSIS)
1000.0000 mL | Freq: Once | INTRAVENOUS | Status: AC
  Administered 2014-12-24: 1000 mL via INTRAVENOUS

## 2014-12-24 MED ORDER — IBUPROFEN 200 MG PO TABS: 600.0000 mg | ORAL_TABLET | Freq: Three times a day (TID) | ORAL | Status: DC | PRN

## 2014-12-24 MED ORDER — HYDROXYUREA 500 MG PO CAPS
1500.0000 mg | ORAL_CAPSULE | Freq: Every day | ORAL | Status: DC
  Administered 2014-12-24 – 2014-12-26 (×3): 1500 mg via ORAL
  Filled 2014-12-24 (×3): qty 3

## 2014-12-24 MED ORDER — HYDROMORPHONE 2 MG/ML HIGH CONCENTRATION IV PCA SOLN
INTRAVENOUS | Status: DC
  Administered 2014-12-24: 4 mg via INTRAVENOUS
  Administered 2014-12-24: 22:00:00 via INTRAVENOUS
  Administered 2014-12-25: 3.5 mg via INTRAVENOUS
  Administered 2014-12-25 (×2): 9 mg via INTRAVENOUS
  Filled 2014-12-24: qty 25

## 2014-12-24 MED ORDER — OXYCODONE HCL 5 MG PO TABS
15.0000 mg | ORAL_TABLET | Freq: Four times a day (QID) | ORAL | Status: DC | PRN
  Administered 2014-12-25 (×3): 15 mg via ORAL
  Filled 2014-12-24 (×3): qty 3

## 2014-12-24 MED ORDER — ENOXAPARIN SODIUM 40 MG/0.4ML ~~LOC~~ SOLN
40.0000 mg | SUBCUTANEOUS | Status: DC
Start: 2014-12-24 — End: 2014-12-26
  Administered 2014-12-24 – 2014-12-25 (×2): 40 mg via SUBCUTANEOUS
  Filled 2014-12-24 (×3): qty 0.4

## 2014-12-24 NOTE — ED Notes (Signed)
Patient c/o sickle cell pain mainly abdomen and lower back. Patient denies chest pain or SOB.

## 2014-12-24 NOTE — ED Provider Notes (Signed)
CSN: [id number]     Arrival date & time 12/24/14  1643 History   First MD Initiated Contact with Patient 12/24/14 1715     Chief Complaint  Patient presents with  . Sickle Cell Pain Crisis   HPI   29 year old male with a history of sickle cell presents today with pain to his abdomen back. Patient reports history of the same, most recently 1 week ago. Patient reports he was seen in the ED in [place] or this. He notes this episode started several days ago, identical to previous episodes. Patient describes the pain as achy, no focal tenderness. Patient denies chest pain, fevers, chills, cough, shortness of breath, or any other concerning signs or symptoms other than pain today. Patient reports he tried his oxycodone 15 mg today with no improvement in symptoms. No recent infections.   Past Medical History  Diagnosis Date  . Sickle cell anemia   . Pneumonia    Past Surgical History  Procedure Laterality Date  . No past surgeries     Family History  Problem Relation Age of Onset  . Diabetes Father   . Cancer Father 32    Prostate  . Asthma Brother   . Cancer Maternal Grandmother     colon    History  Substance Use Topics  . Smoking status: Current Some Day Smoker -- 0.50 packs/day for 5 years    Types: Cigarettes  . Smokeless tobacco: Never Used  . Alcohol Use: No    Review of Systems  All other systems reviewed and are negative.   Allergies  Amoxicillin  Home Medications   Prior to Admission medications   Medication Sig Start Date End Date Taking? Authorizing Provider  folic acid (FOLVITE) 1 MG tablet Take 1 mg by mouth daily.   Yes Historical Provider, MD  gabapentin (NEURONTIN) 300 MG capsule Take 1 capsule (300 mg total) by mouth 2 (two) times daily. 09/07/13  Yes Leana Gamer, MD  hydroxyurea (HYDREA) 500 MG capsule Take 3 capsules (1,500 mg total) by mouth daily. May take with food to minimize GI side effects. 07/03/14  Yes Dorena Dew, FNP  ibuprofen  (ADVIL,MOTRIN) 600 MG tablet Take 1 tablet (600 mg total) by mouth every 8 (eight) hours as needed for mild pain or moderate pain. 10/30/14  Yes Dorena Dew, FNP  Multiple Vitamin (MULTIVITAMIN) tablet Take 2 tablets by mouth daily.    Yes Historical Provider, MD  oxyCODONE (ROXICODONE) 15 MG immediate release tablet Take 1 tablet (15 mg total) by mouth every 6 (six) hours as needed for pain. 11/29/14  Yes Dorena Dew, FNP  triamcinolone (KENALOG) 0.025 % ointment Apply 1 application topically 2 (two) times daily. Patient not taking: Reported on 08/30/2014 07/03/14   Dorena Dew, FNP   BP 138/88 mmHg  Pulse 63  Temp(Src) 98.3 F (36.8 C) (Oral)  Resp 16  Ht 5\' 7"  (1.702 m)  Wt 145 lb (65.772 kg)  BMI 22.71 kg/m2  SpO2 99%   Physical Exam  Constitutional: He is oriented to person, place, and time. He appears well-developed and well-nourished.  HENT:  Head: Normocephalic and atraumatic.  Eyes: Pupils are equal, round, and reactive to light.  Neck: Normal range of motion. Neck supple. No JVD present. No tracheal deviation present. No thyromegaly present.  Cardiovascular: Normal rate, regular rhythm, normal heart sounds and intact distal pulses.  Exam reveals no gallop and no friction rub.   No murmur heard. Pulmonary/Chest: Effort normal and  breath sounds normal. No stridor. No respiratory distress. He has no wheezes. He has no rales. He exhibits no tenderness.  Abdominal: Soft. He exhibits no distension and no mass. There is no tenderness. There is no rebound and no guarding.  Diffuse minimal tenderness to palpation, no focal pain  Musculoskeletal: Normal range of motion.  Tenderness to palpation of the back diffusely. Normal range of motion and strength of all major muscle groups and joints no signs of infection  Lymphadenopathy:    He has no cervical adenopathy.  Neurological: He is alert and oriented to person, place, and time. Coordination normal.  Skin: Skin is warm and  dry.  Psychiatric: He has a normal mood and affect. His behavior is normal. Judgment and thought content normal.  Nursing note and vitals reviewed.   ED Course  Procedures (including critical care time) Labs Review Labs Reviewed  CBC WITH DIFFERENTIAL/PLATELET - Abnormal; Notable for the following:    WBC 18.9 (*)    RBC 3.10 (*)    Hemoglobin 9.6 (*)    HCT 27.8 (*)    RDW 17.6 (*)    Neutro Abs 11.5 (*)    Lymphs Abs 5.3 (*)    Monocytes Absolute 1.7 (*)    All other components within normal limits  RETICULOCYTES - Abnormal; Notable for the following:    Retic Ct Pct 10.6 (*)    RBC. 3.10 (*)    Retic Count, Manual 328.6 (*)    All other components within normal limits  COMPREHENSIVE METABOLIC PANEL - Abnormal; Notable for the following:    Glucose, Bld 101 (*)    Creatinine, Ser 0.59 (*)    AST 46 (*)    All other components within normal limits    Imaging Review No results found.   EKG Interpretation None      MDM   Final diagnoses:  Sickle cell pain crisis    Labs: CBC, CMP, reticulocyte- significant for WBC 18.9 hemoglobin 9.6 /reticulocyte percent 10.6, reticulocyte count 328.6  Imaging: None indicated  Consults: Hospitalist  Therapeutics: Dilaudid 1 mg x 3  Assessment:  Plan: Well-appearing male in obvious pain with normal vital signs today. No signs of infection, no chest pain, shortness of breath, no focal abdominal tenderness, this likely represents acute sickle cell pain crisis. 3 doses of Dilaudid given here in the ED with only minimal improvement in symptoms. Hospital consulted that for pain management.       Okey Regal, PA-C 12/26/14 2004  Quintella Reichert, MD 01/01/15 [phone]

## 2014-12-24 NOTE — ED Notes (Signed)
Patient transported to X-ray 

## 2014-12-24 NOTE — H&P (Signed)
[facility] History and Physical  Patient: Alejandro Soto  MRN: [id number]  DOB: 1986-01-29  DOS: the patient was seen and examined on 12/24/2014 PCP: MATTHEWS,MICHELLE A., MD  Referring physician: Dr Ralene Bathe Chief Complaint: back pain and abdominal pain  HPI: Alejandro Soto is a 29 y.o. male with Past medical history of sickle cell disease. The pt is presenting with c/o abdominal pain that is sharp shooting and radiating to his back, it was 10/10 in intensity on arrival and still is not improved. He denies having any association with food or bowel movement. Pt denies any fever, chills, headache, runny nose, neck pain, choking episodes, cough, rash anywhere,  chest pain, palpitation, shortness of breath, nausea, vomiting, diarrhea, constipation, acid reflux, active bleeding, black color BM,  burning urination, increase urinary frequency. He has this pain since last 1 week and it has been intermittent but since today it is continuous and not improving with routine pain medications that he has available at home.  The patient is coming from home.  At his baseline ambulates without any support And is independent for most of his ADL manages her medication on his own.  Review of Systems: as mentioned in the history of present illness.  A comprehensive review of the other systems is negative.  Past Medical History  Diagnosis Date  . Sickle cell anemia   . Pneumonia    Past Surgical History  Procedure Laterality Date  . No past surgeries     Social History:  reports that he has been smoking Cigarettes.  He has a 2.5 pack-year smoking history. He has never used smokeless tobacco. He reports that he does not drink alcohol or use illicit drugs.  Allergies  Allergen Reactions  . Amoxicillin Diarrhea    Family History  Problem Relation Age of Onset  . Diabetes Father   . Cancer Father 22    Prostate  . Asthma Brother   . Cancer Maternal Grandmother     colon     Prior to  Admission medications   Medication Sig Start Date End Date Taking? Authorizing Provider  folic acid (FOLVITE) 1 MG tablet Take 1 mg by mouth daily.   Yes Historical Provider, MD  gabapentin (NEURONTIN) 300 MG capsule Take 1 capsule (300 mg total) by mouth 2 (two) times daily. 09/07/13  Yes Leana Gamer, MD  hydroxyurea (HYDREA) 500 MG capsule Take 3 capsules (1,500 mg total) by mouth daily. May take with food to minimize GI side effects. 07/03/14  Yes Dorena Dew, FNP  ibuprofen (ADVIL,MOTRIN) 600 MG tablet Take 1 tablet (600 mg total) by mouth every 8 (eight) hours as needed for mild pain or moderate pain. 10/30/14  Yes Dorena Dew, FNP  Multiple Vitamin (MULTIVITAMIN) tablet Take 2 tablets by mouth daily.    Yes Historical Provider, MD  oxyCODONE (ROXICODONE) 15 MG immediate release tablet Take 1 tablet (15 mg total) by mouth every 6 (six) hours as needed for pain. 11/29/14  Yes Dorena Dew, FNP  triamcinolone (KENALOG) 0.025 % ointment Apply 1 application topically 2 (two) times daily. Patient not taking: Reported on 08/30/2014 07/03/14   Dorena Dew, FNP    Physical Exam: Filed Vitals:   12/24/14 2000 12/24/14 2021 12/24/14 2213 12/24/14 2218  BP: 124/87 138/88 142/87   Pulse: 68 63 70   Temp:   99.5 F (37.5 C)   TempSrc:   Oral   Resp:  16 20 19   Height:   5\' 7"  (1.702  m)   Weight:   61.4 kg (135 lb 5.8 oz)   SpO2: 100% 99% 99% 99%    General: Alert, Awake and Oriented to Time, Place and Person. Appear in moderate distress Eyes: PERRL ENT: Oral Mucosa clear moist. Neck: no JVD Cardiovascular: S1 and S2 Present, no Murmur, Peripheral Pulses Present Respiratory: Bilateral Air entry equal and Decreased,  Clear to Auscultation, no Crackles, no wheezes Abdomen: Bowel Sound present, Soft and non tender Skin: no Rash Extremities: no Pedal edema, no calf tenderness Neurologic: Grossly no focal neuro deficit.  Labs on Admission:  CBC:  Recent Labs Lab  12/24/14 1643  WBC 18.9*  NEUTROABS 11.5*  HGB 9.6*  HCT 27.8*  MCV 89.7  PLT 325    CMP     Component Value Date/Time   NA 141 12/24/2014 1643   NA 138 10/19/2012   K 3.9 12/24/2014 1643   CL 106 12/24/2014 1643   CO2 28 12/24/2014 1643   GLUCOSE 101* 12/24/2014 1643   BUN 11 12/24/2014 1643   BUN 5 10/19/2012   CREATININE 0.59* 12/24/2014 1643   CREATININE 0.75 11/29/2014 1405   CREATININE 0.7 10/19/2012   CALCIUM 9.5 12/24/2014 1643   PROT 8.0 12/24/2014 1643   ALBUMIN 4.4 12/24/2014 1643   AST 46* 12/24/2014 1643   ALT 34 12/24/2014 1643   ALKPHOS 51 12/24/2014 1643   BILITOT 1.0 12/24/2014 1643   GFRNONAA >60 12/24/2014 1643   GFRNONAA >89 04/25/2014 1514   GFRAA >60 12/24/2014 1643   GFRAA >89 04/25/2014 1514     Recent Labs Lab 12/24/14 2105  LIPASE 45  AMYLASE 82    No results for input(s): CKTOTAL, CKMB, CKMBINDEX, TROPONINI in the last 168 hours. BNP (last 3 results) No results for input(s): BNP in the last 8760 hours.  ProBNP (last 3 results) No results for input(s): PROBNP in the last 8760 hours.   Radiological Exams on Admission: Dg Abd 2 Views  12/24/2014   CLINICAL DATA:  Epigastric abdominal pain for 2 days.  EXAM: ABDOMEN - 2 VIEW  COMPARISON:  Previous CT imaging currently not available.  FINDINGS: Cardiomegaly and small calcified spleen correlate with sickle cell history.  Nonobstructive bowel gas pattern. Stool volume within normal limits. No evidence of bowel perforation. No concerning intra-abdominal mass effect.  IMPRESSION: 1. Nonobstructive bowel gas pattern. 2. Splenic auto infarction.   Electronically Signed   By: Monte Fantasia M.D.   On: 12/24/2014 21:54   Assessment/Plan Principal Problem:   Sickle cell pain crisis Active Problems:   Chronic pain syndrome   Sickle cell disease, type SS   Anemia   Abdominal pain, epigastric   1. Sickle cell pain crisis The pt is presenting with abdominal pain which he describes as his  routine sickle cell pain crisis. Preliminary work up including LFT, lipase, lactic acid as well as XR abdomen are unremarkable. 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 Per protocol pt will be placed on dilaudid PCA based on his prior admission dose. I will continue his hydrea as well as folic acid and hydrate with IV fluid. Sickle cell team will take over the care in AM.  2.abdominal pain Preliminary work up including LFT, lipase, lactic acid as well as XR abdomen are unremarkable. Most likely related to his sickle cell disease. continue to monitor  DVT Prophylaxis: subcutaneous Heparin Nutrition: regular diet  Disposition: Admitted as inpatient, med-surge unit.  Author: Berle Mull, MD [facility]  Pager: [phone] 12/24/2014  If 7PM-7AM, please contact night-coverage [email or web address] Password [email or web address]

## 2014-12-25 DIAGNOSIS — D5700 Hb-SS disease with crisis, unspecified: Principal | ICD-10-CM

## 2014-12-25 DIAGNOSIS — D72829 Elevated white blood cell count, unspecified: Secondary | ICD-10-CM

## 2014-12-25 MED ORDER — HYDROMORPHONE 2 MG/ML HIGH CONCENTRATION IV PCA SOLN
INTRAVENOUS | Status: DC
  Administered 2014-12-25: 20:00:00 via INTRAVENOUS
  Administered 2014-12-26: 6.5 mg via INTRAVENOUS
  Administered 2014-12-26: 7.5 mg via INTRAVENOUS
  Administered 2014-12-26: 19 mg via INTRAVENOUS
  Administered 2014-12-26: 7 mg via INTRAVENOUS
  Filled 2014-12-25 (×2): qty 25

## 2014-12-25 NOTE — Progress Notes (Signed)
Patient admitted to room 1332 from ED in sickle cell crisis. Pain 9/10 currently. PCA started. Patient oriented to room and unit.

## 2014-12-25 NOTE — Progress Notes (Signed)
SICKLE CELL SERVICE PROGRESS NOTE  Alejandro Soto HTD:[id number] DOB: Aug 28, 1985 DOA: 12/24/2014 PCP: Treshon Stannard A., MD  Assessment/Plan: Principal Problem:   Sickle cell pain crisis Active Problems:   Chronic pain syndrome   Sickle cell disease, type SS   Anemia   Abdominal pain, epigastric  1. Hb SS with crisis: Opiate naive patient presents with crisis. Pt currently on appropriate dose on PCA. Will continue current dose of Dilaudid on PCA, Toradol and IVF. Anticipate discharge in 2 days. 2. Leukocytosis: No evidence of infection. Likely secondary to crisis. Will continue to monitor.  3. Anemia of chronic disease: Hb at baseline.  4. Chronic pain: Pt's level of Hb F and Hb S are not consistent with the level of pain. Will likely benefit from consultation with pain clinic.    Code Status: Full Code Family Communication: N/A Disposition Plan: Anticipate D/C in next 48 hours.  Makinsey Pepitone A.  Pager [phone]. If 7PM-7AM, please contact night-coverage.  12/25/2014, 2:19 PM  LOS: 1 day    Procedures:  None  Antibiotics:  None  HPI/Subjective: Pt states that pain is currently 7/10. Pain localized to back and pelvic girdle. Last BM last night.  Objective: Filed Vitals:   12/25/14 0805 12/25/14 1024 12/25/14 1203 12/25/14 1311  BP:  114/67  112/71  Pulse:  80  75  Temp:  98 F (36.7 C)  98.1 F (36.7 C)  TempSrc:  Oral  Oral  Resp: 16 14 16 15   Height:      Weight:      SpO2: 100% 98% 99% 98%   Weight change:   Intake/Output Summary (Last 24 hours) at 12/25/14 1419 Last data filed at 12/25/14 1210  Gross per 24 hour  Intake 1936.25 ml  Output   1050 ml  Net 886.25 ml    General: Alert, awake, oriented x3, in no acute distress.  HEENT: [place]/AT PEERL, EOMI, anicteric Neck: Trachea midline,  no masses, no thyromegal,y no JVD, no carotid bruit OROPHARYNX:  Moist, No exudate/ erythema/lesions.  Heart: Regular rate and rhythm, without murmurs, rubs,  gallops.  Lungs: Clear to auscultation, no wheezing or rhonchi noted.  Abdomen: Soft, nontender, nondistended, positive bowel sounds, no masses no hepatosplenomegaly noted.  Neuro: No focal neurological deficits noted cranial nerves II through XII grossly intact.  Strength at baseline in bilateral upper and lower extremities. Musculoskeletal: No warm swelling or erythema around joints, no spinal tenderness noted. Psychiatric: Patient alert and oriented x3, good insight and cognition, good recent to remote recall. Lymph node survey: No cervical axillary or inguinal lymphadenopathy noted.   Data Reviewed: Basic Metabolic Panel:  Recent Labs Lab 12/24/14 1643 12/24/14 2105  NA 141  --   K 3.9  --   CL 106  --   CO2 28  --   GLUCOSE 101*  --   BUN 11  --   CREATININE 0.59*  --   CALCIUM 9.5  --   MG  --  1.7   Liver Function Tests:  Recent Labs Lab 12/24/14 1643  AST 46*  ALT 34  ALKPHOS 51  BILITOT 1.0  PROT 8.0  ALBUMIN 4.4    Recent Labs Lab 12/24/14 2105  LIPASE 45  AMYLASE 82   No results for input(s): AMMONIA in the last 168 hours. CBC:  Recent Labs Lab 12/24/14 1643  WBC 18.9*  NEUTROABS 11.5*  HGB 9.6*  HCT 27.8*  MCV 89.7  PLT 325   Cardiac Enzymes: No results for input(s): CKTOTAL, CKMB,  CKMBINDEX, TROPONINI in the last 168 hours. BNP (last 3 results) No results for input(s): BNP in the last 8760 hours.  ProBNP (last 3 results) No results for input(s): PROBNP in the last 8760 hours.  CBG: No results for input(s): GLUCAP in the last 168 hours.  No results found for this or any previous visit (from the past 240 hour(s)).   Studies: Dg Abd 2 Views  12/24/2014   CLINICAL DATA:  Epigastric abdominal pain for 2 days.  EXAM: ABDOMEN - 2 VIEW  COMPARISON:  Previous CT imaging currently not available.  FINDINGS: Cardiomegaly and small calcified spleen correlate with sickle cell history.  Nonobstructive bowel gas pattern. Stool volume within normal  limits. No evidence of bowel perforation. No concerning intra-abdominal mass effect.  IMPRESSION: 1. Nonobstructive bowel gas pattern. 2. Splenic auto infarction.   Electronically Signed   By: Monte Fantasia M.D.   On: 12/24/2014 21:54    Scheduled Meds: . enoxaparin (LOVENOX) injection  40 mg Subcutaneous Q24H  . famotidine  20 mg Oral Q12H  . folic acid  1 mg Oral Daily  . gabapentin  300 mg Oral BID  . HYDROmorphone PCA 2 mg/mL   Intravenous 6 times per day  . hydroxyurea  1,500 mg Oral Daily  . senna-docusate  1 tablet Oral BID   Continuous Infusions: . sodium chloride 1,000 mL (12/24/14 2218)    Time spent 25 minutes.

## 2014-12-26 ENCOUNTER — Telehealth: Payer: Self-pay | Admitting: Family Medicine

## 2014-12-26 DIAGNOSIS — G894 Chronic pain syndrome: Secondary | ICD-10-CM

## 2014-12-26 LAB — CBC WITH DIFFERENTIAL/PLATELET
Basophils Absolute: 0.1 10*3/uL (ref 0.0–0.1)
Basophils Relative: 1 % (ref 0–1)
EOS PCT: 3 % (ref 0–5)
Eosinophils Absolute: 0.4 10*3/uL (ref 0.0–0.7)
HCT: 24.3 % — ABNORMAL LOW (ref 39.0–52.0)
Hemoglobin: 8.5 g/dL — ABNORMAL LOW (ref 13.0–17.0)
LYMPHS ABS: 4.9 10*3/uL — AB (ref 0.7–4.0)
Lymphocytes Relative: 40 % (ref 12–46)
MCH: 31.3 pg (ref 26.0–34.0)
MCHC: 35 g/dL (ref 30.0–36.0)
MCV: 89.3 fL (ref 78.0–100.0)
MONOS PCT: 12 % (ref 3–12)
Monocytes Absolute: 1.4 10*3/uL — ABNORMAL HIGH (ref 0.1–1.0)
NEUTROS PCT: 44 % (ref 43–77)
Neutro Abs: 5.3 10*3/uL (ref 1.7–7.7)
PLATELETS: 262 10*3/uL (ref 150–400)
RBC: 2.72 MIL/uL — ABNORMAL LOW (ref 4.22–5.81)
RDW: 17.5 % — ABNORMAL HIGH (ref 11.5–15.5)
WBC: 12.1 10*3/uL — ABNORMAL HIGH (ref 4.0–10.5)

## 2014-12-26 LAB — BASIC METABOLIC PANEL
ANION GAP: 7 (ref 5–15)
BUN: 7 mg/dL (ref 6–20)
CO2: 23 mmol/L (ref 22–32)
Calcium: 8.6 mg/dL — ABNORMAL LOW (ref 8.9–10.3)
Chloride: 107 mmol/L (ref 101–111)
Creatinine, Ser: 0.52 mg/dL — ABNORMAL LOW (ref 0.61–1.24)
GFR calc Af Amer: >60 mL/min (ref >60)
GFR calc non Af Amer: >60 mL/min (ref >60)
Glucose, Bld: 88 mg/dL (ref 65–99)
Potassium: 3.6 mmol/L (ref 3.5–5.1)
Sodium: 137 mmol/L (ref 135–145)

## 2014-12-26 MED ORDER — KETOROLAC TROMETHAMINE 30 MG/ML IJ SOLN
30.0000 mg | Freq: Four times a day (QID) | INTRAMUSCULAR | Status: DC
  Administered 2014-12-26: 30 mg via INTRAVENOUS
  Filled 2014-12-26: qty 1

## 2014-12-26 MED ORDER — OXYCODONE HCL 5 MG PO TABS
15.0000 mg | ORAL_TABLET | ORAL | Status: DC
  Administered 2014-12-26: 15 mg via ORAL
  Filled 2014-12-26: qty 3

## 2014-12-26 NOTE — Progress Notes (Signed)
Wasted 27ml high concentration 2mg /ml Dilaudid PCA - witnessed by Jillyn Hidden, RN

## 2014-12-26 NOTE — Progress Notes (Signed)
Discharge instructions reviewed with pt. Pt verbalizes understanding of need to follow up with the sickle cell center this week and that he will receive any pain prescription then per Dr. Zigmund Daniel. Besides concern of pain medication, pt has no other complaints or questions. Pt is stable. Will be leaving shortly

## 2014-12-26 NOTE — Discharge Summary (Signed)
Alejandro Soto MRN: [id number] DOB/AGE: Mar 04, 1986 29 y.o.  Admit date: 12/24/2014 Discharge date: 12/26/2014  Primary Care Physician:  NO PCP ON FILE   Discharge Diagnoses:   Patient Active Problem List   Diagnosis Date Noted  . Sickle cell pain crisis 12/24/2014  . Sickle cell crisis 12/24/2014  . Abdominal pain, epigastric 12/24/2014  . Thrombocytosis 07/14/2014  . Leukocytosis 07/14/2014  . Anemia 07/14/2014  . Sore throat 07/03/2014  . Sebaceous cyst 07/03/2014  . Atopic dermatitis 06/12/2014  . Tobacco dependence 04/25/2014  . Neck pain, bilateral 03/01/2014  . Hb-SS disease without crisis 02/22/2014  . DJD (degenerative joint disease), lumbosacral 09/18/2013  . Vitamin D deficiency 09/18/2013  . Hyperglycemia 09/18/2013  . Neuropathic pain 09/18/2013  . Back pain, acute 08/11/2013  . Opiate use 05/24/2013  . Chronic pain syndrome 05/24/2013  . Sickle cell disease, type SS 05/24/2013  . Back pain 11/01/2012  . Pain in joint, shoulder region 11/01/2012  . Pain in joint, lower leg 11/01/2012    DISCHARGE MEDICATION:   Medication List    TAKE these medications        folic acid 1 MG tablet  Commonly known as:  FOLVITE  Take 1 mg by mouth daily.     gabapentin 300 MG capsule  Commonly known as:  NEURONTIN  Take 1 capsule (300 mg total) by mouth 2 (two) times daily.     hydroxyurea 500 MG capsule  Commonly known as:  HYDREA  Take 3 capsules (1,500 mg total) by mouth daily. May take with food to minimize GI side effects.     ibuprofen 600 MG tablet  Commonly known as:  ADVIL,MOTRIN  Take 1 tablet (600 mg total) by mouth every 8 (eight) hours as needed for mild pain or moderate pain.     multivitamin tablet  Take 2 tablets by mouth daily.     oxyCODONE 15 MG immediate release tablet  Commonly known as:  ROXICODONE  Take 1 tablet (15 mg total) by mouth every 6 (six) hours as needed for pain.     triamcinolone 0.025 % ointment  Commonly known as:  KENALOG   Apply 1 application topically 2 (two) times daily.          Consults:     SIGNIFICANT DIAGNOSTIC STUDIES:  Dg Abd 2 Views  12/24/2014   CLINICAL DATA:  Epigastric abdominal pain for 2 days.  EXAM: ABDOMEN - 2 VIEW  COMPARISON:  Previous CT imaging currently not available.  FINDINGS: Cardiomegaly and small calcified spleen correlate with sickle cell history.  Nonobstructive bowel gas pattern. Stool volume within normal limits. No evidence of bowel perforation. No concerning intra-abdominal mass effect.  IMPRESSION: 1. Nonobstructive bowel gas pattern. 2. Splenic auto infarction.   Electronically Signed   By: Monte Fantasia M.D.   On: 12/24/2014 21:54     No results found for this or any previous visit (from the past 240 hour(s)).  BRIEF ADMITTING H & P: Alejandro Soto is a 29 y.o. male with Past medical history of sickle cell disease. The pt is presenting with c/o abdominal pain that is sharp shooting and radiating to his back, it was 10/10 in intensity on arrival and still is not improved. He denies having any association with food or bowel movement. Pt denies any fever, chills, headache, runny nose, neck pain, choking episodes, cough, rash anywhere,  chest pain, palpitation, shortness of breath, nausea, vomiting, diarrhea, constipation, acid reflux, active bleeding, black color BM,  burning urination,  increase urinary frequency. He has this pain since last 1 week and it has been intermittent but since today it is continuous and not improving with routine pain medications that he has available at home.  The patient is coming from home.  At his baseline ambulates without any support And is independent for most of his ADL manages her medication on his own.    Hospital Course:  Present on Admission:  . Sickle cell pain crisis: Pt was admitted and started on weight based PCA. Toradol was not initially ordered, however addition of Toradol improved pain control. He was transitioned  to oral analgesics as is discharged home on usual dose of analgesics. He is to [facility] up with [facility] in 1 week.  . Anemia: Hb remained stable and he had no requirement for transfusion. . Chronic pain syndrome: Recommend that patient be referred to a pain clinic.  . Sickle cell disease, type SS: Continue Hydrea.   Disposition and Follow-up:  Pt discharged home in stable condition. He is to follow up in the [facility] in one week.     Discharge Instructions    Diet - low sodium heart healthy    Complete by:  As directed      Increase activity slowly    Complete by:  As directed            DISCHARGE EXAM:  General: Alert, awake, oriented x3, in mild distress.  Vital Signs: BP 122/76, HR 85, T 98.3 F (36.8 C), temperature source Oral, RR 14, height 5\' 7"  (1.702 m), weight 139 lb 8.8 oz (63.3 kg), SpO2 100 %. HEENT: [place]/AT PEERL, EOMI, anicteric Neck: Trachea midline, no masses, no thyromegal,y no JVD, no carotid bruit OROPHARYNX: Moist, No exudate/ erythema/lesions.  Heart: Regular rate and rhythm, without murmurs, rubs, gallops or S3. PMI non-displaced. Exam reveals no decreased pulses. Pulmonary/Chest: Normal effort. Breath sounds normal. No. Apnea. Clear to auscultation,no stridor,  no wheezing and no rhonchi noted. No respiratory distress and no tenderness noted. Abdomen: Soft, nontender, nondistended, normal bowel sounds, no masses no hepatosplenomegaly noted. No fluid wave and no ascites. There is no guarding or rebound. Neuro: Alert and oriented to person, place and time. Normal motor skills, Displays no atrophy or tremors and exhibits normal muscle tone.  No focal neurological deficits noted cranial nerves II through XII grossly intact. No sensory deficit noted.  Strength at baseline in bilateral upper and lower extremities. Gait normal. Musculoskeletal: No warm swelling or erythema around joints, no spinal tenderness noted. Psychiatric: Patient alert and  oriented x3, good insight and cognition, good recent to remote recall. Skin: Skin is warm and dry. No bruising, no ecchymosis and no rash noted. Pt is not diaphoretic. No erythema. No pallor Psychiatric: Mood, memory, affect and judgement normal     Recent Labs  12/24/14 1643 12/24/14 2105 12/26/14 0350  NA 141  --  137  K 3.9  --  3.6  CL 106  --  107  CO2 28  --  23  GLUCOSE 101*  --  88  BUN 11  --  7  CREATININE 0.59*  --  0.52*  CALCIUM 9.5  --  8.6*  MG  --  1.7  --     Recent Labs  12/24/14 1643  AST 46*  ALT 34  ALKPHOS 51  BILITOT 1.0  PROT 8.0  ALBUMIN 4.4    Recent Labs  12/24/14 2105  LIPASE 45  AMYLASE 82  Recent Labs  12/24/14 1643 12/26/14 0350  WBC 18.9* 12.1*  NEUTROABS 11.5* 5.3  HGB 9.6* 8.5*  HCT 27.8* 24.3*  MCV 89.7 89.3  PLT 325 262     Total time spent including face to face and decision making was greater than 30 minutes  Signed: Daysy Santini A. 12/26/2014, 3:42 PM

## 2014-12-26 NOTE — Care Management (Signed)
Important Message  Patient Details  Name: Alejandro Soto MRN: [id number] Date of Birth: 1985/11/22   Medicare Important Message Given:  Yes-second notification given    Camillo Flaming, LCSW 12/26/2014, 10:00 AM

## 2014-12-26 NOTE — Telephone Encounter (Signed)
Refill request for oxycodone 15mg . LOV 11/29/2014. Please advise. Thanks!

## 2014-12-26 NOTE — Progress Notes (Signed)
SICKLE CELL SERVICE PROGRESS NOTE  Alejandro Soto DSK:[id number] DOB: 1986-06-04 DOA: 12/24/2014 PCP: Tryston Gilliam A., MD  Assessment/Plan: Principal Problem:   Sickle cell pain crisis Active Problems:   Chronic pain syndrome   Sickle cell disease, type SS   Anemia   Abdominal pain, epigastric  1. Hb SS with crisis: Opiate tolerant patient presents with crisis. Pt currently on appropriate dose on PCA. He has used 49.5 mg  with 135/95:demands/deliveries in the last 24 hours. I will schedule oral analgesics and continue current dose of Dilaudid on PCA, Toradol and IVF. If he does not require excessive use of the PCA he may be able to be discharged home this evening. 2. Leukocytosis: No evidence of infection. Likely secondary to crisis. Will continue to monitor.  3. Anemia of chronic disease: Hb at baseline.  4. Chronic pain: Pt's level of Hb F and Hb S are not consistent with the level of pain. Will likely benefit from consultation with pain clinic.    Code Status: Full Code Family Communication: N/A Disposition Plan: Anticipate D/C in next 48 hours.  Alejandro Soto A.  Pager [phone]. If 7PM-7AM, please contact night-coverage.  12/26/2014, 12:17 PM  LOS: 2 days    Procedures:  None  Antibiotics:  None  HPI/Subjective: Pt states that pain is currently 7/10 with baseline pain 5/10. Pain localized to back  Last BM last night.  Objective: Filed Vitals:   12/26/14 0502 12/26/14 0800 12/26/14 0949 12/26/14 1147  BP: 110/51  110/68   Pulse: 67  71   Temp: 98.3 F (36.8 C)  98.3 F (36.8 C)   TempSrc: Oral  Oral   Resp: 16 12 13 14   Height:      Weight: 139 lb 8.8 oz (63.3 kg)     SpO2: 100% 99% 100% 100%   Weight change: -5 lb 7.2 oz (-2.472 kg)  Intake/Output Summary (Last 24 hours) at 12/26/14 1217 Last data filed at 12/26/14 7416  Gross per 24 hour  Intake    480 ml  Output   2350 ml  Net  -1870 ml    General: Alert, awake, oriented x3, in no acute  distress.  HEENT: [place]/AT PEERL, EOMI, anicteric Neck: Trachea midline,  no masses, no thyromegal,y no JVD, no carotid bruit OROPHARYNX:  Moist, No exudate/ erythema/lesions.  Heart: Regular rate and rhythm, without murmurs, rubs, gallops.  Lungs: Clear to auscultation, no wheezing or rhonchi noted.  Abdomen: Soft, nontender, nondistended, positive bowel sounds, no masses no hepatosplenomegaly noted.  Neuro: No focal neurological deficits noted cranial nerves II through XII grossly intact.  Strength at baseline in bilateral upper and lower extremities. Musculoskeletal: No warm swelling or erythema around joints, no spinal tenderness noted. Psychiatric: Patient alert and oriented x3, good insight and cognition, good recent to remote recall.    Data Reviewed: Basic Metabolic Panel:  Recent Labs Lab 12/24/14 1643 12/24/14 2105 12/26/14 0350  NA 141  --  137  K 3.9  --  3.6  CL 106  --  107  CO2 28  --  23  GLUCOSE 101*  --  88  BUN 11  --  7  CREATININE 0.59*  --  0.52*  CALCIUM 9.5  --  8.6*  MG  --  1.7  --    Liver Function Tests:  Recent Labs Lab 12/24/14 1643  AST 46*  ALT 34  ALKPHOS 51  BILITOT 1.0  PROT 8.0  ALBUMIN 4.4    Recent Labs Lab 12/24/14 2105  LIPASE 45  AMYLASE 82   No results for input(s): AMMONIA in the last 168 hours. CBC:  Recent Labs Lab 12/24/14 1643 12/26/14 0350  WBC 18.9* 12.1*  NEUTROABS 11.5* 5.3  HGB 9.6* 8.5*  HCT 27.8* 24.3*  MCV 89.7 89.3  PLT 325 262   Cardiac Enzymes: No results for input(s): CKTOTAL, CKMB, CKMBINDEX, TROPONINI in the last 168 hours. BNP (last 3 results) No results for input(s): BNP in the last 8760 hours.  ProBNP (last 3 results) No results for input(s): PROBNP in the last 8760 hours.  CBG: No results for input(s): GLUCAP in the last 168 hours.  No results found for this or any previous visit (from the past 240 hour(s)).   Studies: Dg Abd 2 Views  12/24/2014   CLINICAL DATA:  Epigastric  abdominal pain for 2 days.  EXAM: ABDOMEN - 2 VIEW  COMPARISON:  Previous CT imaging currently not available.  FINDINGS: Cardiomegaly and small calcified spleen correlate with sickle cell history.  Nonobstructive bowel gas pattern. Stool volume within normal limits. No evidence of bowel perforation. No concerning intra-abdominal mass effect.  IMPRESSION: 1. Nonobstructive bowel gas pattern. 2. Splenic auto infarction.   Electronically Signed   By: Monte Fantasia M.D.   On: 12/24/2014 21:54    Scheduled Meds: . enoxaparin (LOVENOX) injection  40 mg Subcutaneous Q24H  . famotidine  20 mg Oral Q12H  . folic acid  1 mg Oral Daily  . gabapentin  300 mg Oral BID  . HYDROmorphone PCA 2 mg/mL   Intravenous 6 times per day  . hydroxyurea  1,500 mg Oral Daily  . oxyCODONE  15 mg Oral Q4H  . senna-docusate  1 tablet Oral BID   Continuous Infusions: . sodium chloride 75 mL/hr at 12/25/14 2203    Time spent 30 minutes.

## 2014-12-26 NOTE — Telephone Encounter (Signed)
Office visit

## 2014-12-27 NOTE — Telephone Encounter (Signed)
Please call and schedule an appointment. Thanks!

## 2014-12-28 ENCOUNTER — Ambulatory Visit (INDEPENDENT_AMBULATORY_CARE_PROVIDER_SITE_OTHER): Payer: Medicare Other | Admitting: Family Medicine

## 2014-12-28 VITALS — BP 126/79 | HR 16 | Temp 98.3°F | Ht 67.0 in | Wt 126.0 lb

## 2014-12-28 DIAGNOSIS — D571 Sickle-cell disease without crisis: Secondary | ICD-10-CM | POA: Diagnosis not present

## 2014-12-28 DIAGNOSIS — G894 Chronic pain syndrome: Secondary | ICD-10-CM | POA: Diagnosis not present

## 2014-12-28 MED ORDER — OXYCODONE HCL 15 MG PO TABS: 15.0000 mg | ORAL_TABLET | Freq: Four times a day (QID) | ORAL | Status: DC | PRN

## 2014-12-28 NOTE — Patient Instructions (Signed)
Continue medications at same dosage Follow-up in 4 weeks for refill of medication Here or ED for crisis. Narcotics only from [place].

## 2014-12-28 NOTE — Progress Notes (Signed)
Subjective:     Patient ID: Alejandro Soto, male   DOB: 09-Dec-1985, 29 y.o.   MRN: [id number]  HPI   Patient here for follow-up of SCD. He is taking his narcotic q 6 hours fairly regularly. He reports taking the medication for back, side, leg a arm pain, which is sometimes 10/10. He is unable to distinguish [place] pain from other chronic pain. With medication he still reports 4-5/10.   Review of Systems   Negative for fever, chills, appetite loss Negative for skin rashes or sores Negative for chest pain or palpitation Negative for Shortness of breath or coughing Positive for low back pain, extremety pain, and neck pain Positive for occassional headaches. Negative for dizziness, seizures, tremors.     Objective:   Physical Exam   Alert, oriented, appropriate, in no distress Skin warm and dry HEENT benign Lungs clear to auscultation HS are regular w/o m,g,r.     Assessment Plan     SCD - continue current treatment regime. - I have refilled his Oxycodone 15, #30, one po q 6 hours prn. -follow-up in one months.   Micheline Chapman, FNP-BC

## 2015-01-28 ENCOUNTER — Encounter: Payer: Self-pay | Admitting: Family Medicine

## 2015-01-28 ENCOUNTER — Ambulatory Visit (INDEPENDENT_AMBULATORY_CARE_PROVIDER_SITE_OTHER): Payer: Medicare Other | Admitting: Family Medicine

## 2015-01-28 VITALS — BP 122/74 | HR 72 | Temp 99.1°F | Resp 14 | Ht 67.0 in | Wt 132.0 lb

## 2015-01-28 DIAGNOSIS — D571 Sickle-cell disease without crisis: Secondary | ICD-10-CM | POA: Diagnosis not present

## 2015-01-28 DIAGNOSIS — G894 Chronic pain syndrome: Secondary | ICD-10-CM

## 2015-01-28 MED ORDER — OXYCODONE HCL 15 MG PO TABS: 15.0000 mg | ORAL_TABLET | Freq: Four times a day (QID) | ORAL | Status: DC | PRN

## 2015-01-28 MED ORDER — MORPHINE SULFATE ER 15 MG PO TBCR: 15.0000 mg | EXTENDED_RELEASE_TABLET | Freq: Two times a day (BID) | ORAL | Status: DC

## 2015-01-28 NOTE — Progress Notes (Signed)
Patient ID: Alejandro Soto, male   DOB: 09-08-1985, 29 y.o.   MRN: [id number]   Alejandro Soto, is a 29 y.o. male  WUJ:[id number]  NFA:[id number]  DOB - 06/26/1985  CC:  Chief Complaint  Patient presents with  . Follow-up    scd       HPI: Alejandro Soto is a 29 y.o. male here for routine follow-up of Sickle Cell Disease and needing refill on his opoid. He is also requesting a long action opoid for his pain. He is currently take oxycodone 15 mg every six hours as needed for pain. He reports no other issues today.  Allergies  Allergen Reactions  . Amoxicillin Diarrhea   Past Medical History  Diagnosis Date  . Sickle cell anemia   . Pneumonia    Current Outpatient Prescriptions on File Prior to Visit  Medication Sig Dispense Refill  . folic acid (FOLVITE) 1 MG tablet Take 1 mg by mouth daily.    Marland Kitchen gabapentin (NEURONTIN) 300 MG capsule Take 1 capsule (300 mg total) by mouth 2 (two) times daily. 60 capsule 2  . hydroxyurea (HYDREA) 500 MG capsule Take 3 capsules (1,500 mg total) by mouth daily. May take with food to minimize GI side effects. 90 capsule 3  . ibuprofen (ADVIL,MOTRIN) 600 MG tablet Take 1 tablet (600 mg total) by mouth every 8 (eight) hours as needed for mild pain or moderate pain. 30 tablet 1  . Multiple Vitamin (MULTIVITAMIN) tablet Take 2 tablets by mouth daily.     Marland Kitchen oxyCODONE (ROXICODONE) 15 MG immediate release tablet Take 1 tablet (15 mg total) by mouth every 6 (six) hours as needed for pain. 90 tablet 0  . triamcinolone (KENALOG) 0.025 % ointment Apply 1 application topically 2 (two) times daily. (Patient not taking: Reported on 08/30/2014) 30 g 2   No current facility-administered medications on file prior to visit.   Family History  Problem Relation Age of Onset  . Diabetes Father   . Cancer Father 67    Prostate  . Asthma Brother   . Cancer Maternal Grandmother     colon    History   Social History  . Marital Status: Single    Spouse Name: N/A  .  Number of Children: N/A  . Years of Education: N/A   Occupational History  . Not on file.   Social History Main Topics  . Smoking status: Current Some Day Smoker -- 0.25 packs/day for 5 years    Types: Cigarettes  . Smokeless tobacco: Never Used  . Alcohol Use: No  . Drug Use: No  . Sexual Activity: Yes   Other Topics Concern  . Not on file   Social History Narrative    Review of Systems: Constitutional: Denies chills, fever, weight loss HENT: Denies Problems Eyes: Denies problems Neck: Denies problems Respiratory: Negative for cough, shortness of breath,   Cardiovascular: Negative for chest pain, palpitations and leg swelling. Gastrointestinal: Negative for abdominal distention, abdominal pain, nausea, vomiting, diarrhea,  Genitourinary: Denies problems Musculoskeletal: Positive for sickle cell pain in back, arms and legs. Neurological: Denies problems Hematological: Denies problems Psychiatric/Behavioral: Denies depression, anxiety.   Objective:   Filed Vitals:   01/28/15 1011  BP: 122/74  Pulse: 72  Temp: 99.1 F (37.3 C)  Resp: 14    Physical Exam: Constitutional: Patient appears well-developed and well-nourished. No distress. HENT: Normocephalic, atraumatic, External right and left ear normal. Oropharynx is clear and moist.  Eyes: Conjunctivae and EOM are normal. PERRLA, no  scleral icterus. Neck: Normal ROM. Neck supple. No lymphadenopathy, No thyromegaly. CVS: RRR, S1/S2 +, no murmurs, no gallops, no rubs Pulmonary: Effort and breath sounds normal, no stridor, rhonchi, wheezes, rales.  Abdominal: Soft. Normoactive BS,, no distension, tenderness, rebound or guarding.  Musculoskeletal: Normal range of motion. No edema and no tenderness.  Neuro: Alert.Normal muscle tone coordination. Non-focal Skin: Skin is warm and dry. No rash noted. Not diaphoretic. No erythema. No pallor. Psychiatric: Normal mood and affect. Behavior, judgment, thought content  normal.  Lab Results  Component Value Date   WBC 12.1* 12/26/2014   HGB 8.5* 12/26/2014   HCT 24.3* 12/26/2014   MCV 89.3 12/26/2014   PLT 262 12/26/2014   Lab Results  Component Value Date   CREATININE 0.52* 12/26/2014   BUN 7 12/26/2014   NA 137 12/26/2014   K 3.6 12/26/2014   CL 107 12/26/2014   CO2 23 12/26/2014    Lab Results  Component Value Date   HGBA1C <4.0 09/07/2013   Lipid Panel  No results found for: CHOL, TRIG, HDL, CHOLHDL, VLDL, LDLCALC     Assessment and plan:   Sickle Cell Disease with chronic pain -Am adding MS Contin 15 mg, #60, one po bid -Continue Oxycodone 15 mg q 6 hrs prn but attempt to decrease frequency.  Follow-up in one month.  The patient was given clear instructions to go to ER or return to medical center if symptoms don't improve, worsen or new problems develop. The patient verbalized understanding. The patient was told to call to get lab results if they haven't heard anything in the next week.       Micheline Chapman, MSN, FNP-BC   01/28/2015, 10:38 AM

## 2015-01-28 NOTE — Patient Instructions (Signed)
You can take MS contin 15 mg twice a day Try to cut down on the amount of Oxycontin you are using.  You should not need a refill until it is time for your next monthly visit.

## 2015-02-13 ENCOUNTER — Telehealth: Payer: Self-pay | Admitting: Family Medicine

## 2015-02-13 NOTE — Telephone Encounter (Signed)
Refill request for Oxycodone 15mg . LOV 01/28/2015. Please advise. Thanks!

## 2015-02-19 ENCOUNTER — Other Ambulatory Visit: Payer: Self-pay | Admitting: Family Medicine

## 2015-02-19 DIAGNOSIS — G894 Chronic pain syndrome: Secondary | ICD-10-CM

## 2015-02-19 DIAGNOSIS — D571 Sickle-cell disease without crisis: Secondary | ICD-10-CM

## 2015-02-19 MED ORDER — OXYCODONE HCL 15 MG PO TABS: 15.0000 mg | ORAL_TABLET | Freq: Four times a day (QID) | ORAL | Status: DC | PRN

## 2015-02-28 ENCOUNTER — Ambulatory Visit: Payer: Medicare Other | Admitting: Family Medicine

## 2015-03-04 ENCOUNTER — Ambulatory Visit: Payer: Medicare Other | Admitting: Family Medicine

## 2015-03-06 ENCOUNTER — Other Ambulatory Visit: Payer: Self-pay | Admitting: Family Medicine

## 2015-03-06 NOTE — Telephone Encounter (Signed)
Refill request for oxycodone 15mg . LOV 01/28/2015. Please Advise. Thanks!

## 2015-03-07 ENCOUNTER — Other Ambulatory Visit: Payer: Self-pay | Admitting: Family Medicine

## 2015-03-07 ENCOUNTER — Ambulatory Visit (INDEPENDENT_AMBULATORY_CARE_PROVIDER_SITE_OTHER): Payer: Medicare Other | Admitting: Family Medicine

## 2015-03-07 VITALS — BP 104/60 | HR 81 | Temp 98.3°F | Resp 16 | Ht 67.0 in | Wt 131.0 lb

## 2015-03-07 DIAGNOSIS — D571 Sickle-cell disease without crisis: Secondary | ICD-10-CM

## 2015-03-07 DIAGNOSIS — G894 Chronic pain syndrome: Secondary | ICD-10-CM

## 2015-03-07 MED ORDER — METHADONE HCL 5 MG PO TABS: ORAL_TABLET | ORAL | Status: DC

## 2015-03-07 MED ORDER — OXYCODONE HCL 15 MG PO TABS: 15.0000 mg | ORAL_TABLET | Freq: Four times a day (QID) | ORAL | Status: DC | PRN

## 2015-03-07 NOTE — Patient Instructions (Signed)
Take methadone 5 mg at bedtime, other continue the same medications.  Follow-up in one month.

## 2015-03-07 NOTE — Progress Notes (Signed)
Patient ID: Alejandro Soto, male   DOB: [birth year], 29 y.o.   MRN: [id number]   Alejandro Soto, is a 29 y.o. male  EZM:[id number]  [id number]:[id number]  DOB - 1985-12-08  CC:  Chief Complaint  Patient presents with  . Follow-up    scd. abdomen pain        HPI: Alejandro Soto is a 29 y.o. male here sickle cell follow-up. He reports doing as usual. He has had no pain crisis since his last visit a month ago. At this time I prescribed MS Contin as a long acting opoid and he has not found that to his liking. He would like to go back to methadone which as used in the past. When he does have pain it is in his back arms, legs and sometimes abdomen and flank. He reports he has cut back on cigarettes to 1-3/day. He denies alcohol or drug use. He is not due for bloodwork until his next visit. His las urine micral was less than a year ago.  Allergies  Allergen Reactions  . Amoxicillin Diarrhea   Past Medical History  Diagnosis Date  . Sickle cell anemia   . Pneumonia    Current Outpatient Prescriptions on File Prior to Visit  Medication Sig Dispense Refill  . folic acid (FOLVITE) 1 MG tablet Take 1 mg by mouth daily.    Marland Kitchen gabapentin (NEURONTIN) 300 MG capsule Take 1 capsule (300 mg total) by mouth 2 (two) times daily. 60 capsule 2  . hydroxyurea (HYDREA) 500 MG capsule Take 3 capsules (1,500 mg total) by mouth daily. May take with food to minimize GI side effects. 90 capsule 3  . ibuprofen (ADVIL,MOTRIN) 600 MG tablet Take 1 tablet (600 mg total) by mouth every 8 (eight) hours as needed for mild pain or moderate pain. 30 tablet 1  . Multiple Vitamin (MULTIVITAMIN) tablet Take 2 tablets by mouth daily.     Marland Kitchen triamcinolone (KENALOG) 0.025 % ointment Apply 1 application topically 2 (two) times daily. (Patient not taking: Reported on 08/30/2014) 30 g 2   No current facility-administered medications on file prior to visit.   Family History  Problem Relation Age of Onset  . Diabetes Father   . Cancer Father  42    Prostate  . Asthma Brother   . Cancer Maternal Grandmother     colon    Social History   Social History  . Marital Status: Single    Spouse Name: N/A  . Number of Children: N/A  . Years of Education: N/A   Occupational History  . Not on file.   Social History Main Topics  . Smoking status: Current Some Day Smoker -- 0.25 packs/day for 5 years    Types: Cigarettes  . Smokeless tobacco: Never Used  . Alcohol Use: No  . Drug Use: No  . Sexual Activity: Yes   Other Topics Concern  . Not on file   Social History Narrative    Review of Systems: Constitutional: Denies chills, fever, weight loss. Positive for fatigue HENT: Negative  Eyes: Negative Neck: Denies problems Respiratory: Negative for cough, shortness of breath,   Cardiovascular: Negative for chest pain, palpitations and leg swelling. Gastrointestinal: Negative for abdominal pain, nausea,vomitng. Positive for some diarrhea Genitourinary: Positive for frequency, polyuria Musculoskeletal: Positive for r shoulder, left leg and low back pain. Neurological: Denies problems Hematological: Denies problems Psychiatric/Behavioral: Denies depression, anxiety.   Objective:   Filed Vitals:   03/07/15 1334  BP: 104/60  Pulse: 81  Temp: 98.3 F (36.8 C)  Resp: 16    Physical Exam: Constitutional: Patient appears well-developed and well-nourished. No distress. HENT: Normocephalic, atraumatic, External right and left ear normal. Oropharynx is clear and moist.  Eyes: Conjunctivae and EOM are normal. PERRLA, no scleral icterus. Neck: Normal ROM. Neck supple. No lymphadenopathy, No thyromegaly. CVS: RRR, S1/S2 +, no murmurs, no gallops, no rubs Pulmonary: Effort and breath sounds normal, no stridor, rhonchi, wheezes, rales.  Abdominal: Soft. Normoactive BS,, no distension, tenderness, rebound or guarding.  Musculoskeletal:No edema and no tenderness.Normal mobility Neuro: Alert.Normal muscle tone coordination.  Non-focal Skin: Skin is warm and dry. No rash noted. Not diaphoretic. No erythema. No pallor. Psychiatric: Normal mood and affect. Behavior, judgment, thought content normal.  Lab Results  Component Value Date   WBC 12.1* 12/26/2014   HGB 8.5* 12/26/2014   HCT 24.3* 12/26/2014   MCV 89.3 12/26/2014   PLT 262 12/26/2014   Lab Results  Component Value Date   CREATININE 0.52* 12/26/2014   BUN 7 12/26/2014   NA 137 12/26/2014   K 3.6 12/26/2014   CL 107 12/26/2014   CO2 23 12/26/2014    Lab Results  Component Value Date   HGBA1C <4.0 09/07/2013   Lipid Panel  No results found for: CHOL, TRIG, HDL, CHOLHDL, VLDL, LDLCALC     Assessment and plan:   There are no diagnoses linked to this encounter.  -Sickle Cell Disease - Methadone 5 mg #30, one po at HS, no refills.  Patient counseled and given handout on use of opoid pain medications, including need to only get from [place] and our ability to follow use on [place]  CSRS and need to keep in a safe locked place away from children and pets. Have reviewed our refill policy related to erly refills if lost or stolen. Have reviewed possible side effects of opoids, Hydrea and need to take other SCD related medications as ordered.  Have review health maintenance needs, including immunizations, urine for proteim, dilated eye exam.  Have review the importance of smoking cessation if currently smoking.   The patient was given clear instructions to go to ER or return to medical center if symptoms don't improve, worsen or new problems develop. The patient verbalized understanding. The patient was told to call to get lab results if they haven't heard anything in the next week.     Return in about 4 weeks (around 04/04/2015).       Micheline Chapman, MSN, FNP-BC   03/07/2015, 2:50 PM

## 2015-03-08 ENCOUNTER — Encounter: Payer: Self-pay | Admitting: Family Medicine

## 2015-03-25 ENCOUNTER — Other Ambulatory Visit: Payer: Self-pay | Admitting: Family Medicine

## 2015-03-25 ENCOUNTER — Telehealth: Payer: Self-pay | Admitting: Internal Medicine

## 2015-03-25 DIAGNOSIS — D571 Sickle-cell disease without crisis: Secondary | ICD-10-CM

## 2015-03-25 DIAGNOSIS — G894 Chronic pain syndrome: Secondary | ICD-10-CM

## 2015-03-25 MED ORDER — OXYCODONE HCL 15 MG PO TABS: 15.0000 mg | ORAL_TABLET | Freq: Four times a day (QID) | ORAL | Status: DC | PRN

## 2015-03-25 NOTE — Telephone Encounter (Signed)
Refill request for oxycodone 15mg . LOV 03/07/2015. Please advise. Thanks!

## 2015-04-02 ENCOUNTER — Telehealth: Payer: Self-pay | Admitting: Internal Medicine

## 2015-04-02 NOTE — Telephone Encounter (Signed)
Refill request for methadone 5mg . LOV 03/07/2015. Please advise. Thanks!

## 2015-04-04 ENCOUNTER — Other Ambulatory Visit: Payer: Self-pay | Admitting: Family Medicine

## 2015-04-04 MED ORDER — METHADONE HCL 5 MG PO TABS: ORAL_TABLET | ORAL | Status: DC

## 2015-04-09 ENCOUNTER — Encounter: Payer: Self-pay | Admitting: Internal Medicine

## 2015-04-09 ENCOUNTER — Ambulatory Visit (INDEPENDENT_AMBULATORY_CARE_PROVIDER_SITE_OTHER): Payer: Medicare Other | Admitting: Internal Medicine

## 2015-04-09 ENCOUNTER — Ambulatory Visit: Payer: Medicare Other | Admitting: Internal Medicine

## 2015-04-09 VITALS — BP 115/72 | HR 79 | Temp 98.4°F | Resp 18 | Ht 67.0 in | Wt 132.0 lb

## 2015-04-09 DIAGNOSIS — G8929 Other chronic pain: Secondary | ICD-10-CM | POA: Diagnosis not present

## 2015-04-09 DIAGNOSIS — D5700 Hb-SS disease with crisis, unspecified: Secondary | ICD-10-CM | POA: Insufficient documentation

## 2015-04-09 DIAGNOSIS — M549 Dorsalgia, unspecified: Secondary | ICD-10-CM | POA: Diagnosis not present

## 2015-04-09 MED ORDER — HYDROXYUREA 500 MG PO CAPS: 1500.0000 mg | ORAL_CAPSULE | Freq: Every day | ORAL | Status: DC

## 2015-04-09 NOTE — Patient Instructions (Signed)
Sickle Cell Anemia, Adult Sickle cell anemia is a condition in which red blood cells have an abnormal "sickle" shape. This abnormal shape shortens the cells' life span, which results in a lower than normal concentration of red blood cells in the blood. The sickle shape also causes the cells to clump together and block free blood flow through the blood vessels. As a result, the tissues and organs of the body do not receive enough oxygen. Sickle cell anemia causes organ damage and pain and increases the risk of infection. CAUSES  Sickle cell anemia is a genetic disorder. Those who receive two copies of the gene have the condition, and those who receive one copy have the trait. RISK FACTORS The sickle cell gene is most common in people whose families originated in [place]. Other areas of the globe where sickle cell trait occurs include the Mediterranean, [place] and [place], the [place], and the [place].  SIGNS AND SYMPTOMS  Pain, especially in the extremities, back, chest, or abdomen (common). The pain may start suddenly or may develop following an illness, especially if there is dehydration. Pain can also occur due to overexertion or exposure to extreme temperature changes.  Frequent severe bacterial infections, especially certain types of pneumonia and meningitis.  Pain and swelling in the hands and feet.  Decreased activity.   Loss of appetite.   Change in behavior.  Headaches.  Seizures.  Shortness of breath or difficulty breathing.  Vision changes.  Skin ulcers. Those with the trait may not have symptoms or they may have mild symptoms.  DIAGNOSIS  Sickle cell anemia is diagnosed with blood tests that demonstrate the genetic trait. It is often diagnosed during the newborn period, due to mandatory testing nationwide. A variety of blood tests, X-rays, CT scans, MRI scans, ultrasounds, and lung function tests may also be done to monitor the condition. TREATMENT  Sickle  cell anemia may be treated with:  Medicines. You may be given pain medicines, antibiotic medicines (to treat and prevent infections) or medicines to increase the production of certain types of hemoglobin.  Fluids.  Oxygen.  Blood transfusions. HOME CARE INSTRUCTIONS   Drink enough fluid to keep your urine clear or pale yellow. Increase your fluid intake in hot weather and during exercise.  Do not smoke. Smoking lowers oxygen levels in the blood.   Only take over-the-counter or prescription medicines for pain, fever, or discomfort as directed by your health care provider.  Take antibiotics as directed by your health care provider. Make sure you finish them it even if you start to feel better.   Take supplements as directed by your health care provider.   Consider wearing a medical alert bracelet. This tells anyone caring for you in an emergency of your condition.   When traveling, keep your medical information, health care provider's names, and the medicines you take with you at all times.   If you develop a fever, do not take medicines to reduce the fever right away. This could cover up a problem that is developing. Notify your health care provider.  Keep all follow-up appointments with your health care provider. Sickle cell anemia requires regular medical care. SEEK MEDICAL CARE IF: You have a fever. SEEK IMMEDIATE MEDICAL CARE IF:   You feel dizzy or faint.   You have new abdominal pain, especially on the left side near the stomach area.   You develop a persistent, often uncomfortable and painful penile erection (priapism). If this is not treated immediately it   will lead to impotence.   You have numbness your arms or legs or you have a hard time moving them.   You have a hard time with speech.   You have a fever or persistent symptoms for more than 2-3 days.   You have a fever and your symptoms suddenly get worse.   You have signs or symptoms of infection.  These include:   Chills.   Abnormal tiredness (lethargy).   Irritability.   Poor eating.   Vomiting.   You develop pain that is not helped with medicine.   You develop shortness of breath.  You have pain in your chest.   You are coughing up pus-like or bloody sputum.   You develop a stiff neck.  Your feet or hands swell or have pain.  Your abdomen appears bloated.  You develop joint pain. MAKE SURE YOU:  Understand these instructions.   This information is not intended to replace advice given to you by your health care provider. Make sure you discuss any questions you have with your health care provider.   Document Released: 09/16/2005 Document Revised: 06/29/2014 Document Reviewed: 01/18/2013 Elsevier Interactive Patient Education 2016 [employer] Inc.  

## 2015-04-09 NOTE — Progress Notes (Signed)
Patient ID: Alejandro Soto, male   DOB: [birth year], 29 y.o.   MRN: [id number]   [name], is a 29 y.o. male  [id number]  ERX:[id number]  DOB - Sep 06, 1985  Chief Complaint  Patient presents with  . Follow-up        Subjective:   Alejandro Soto is a 29 y.o. male with history of sickle cell disease here today for a follow up visit and pain medication refill. Patient complains of pain in his lower back and sides, pain is scaled at a 6. Pain has been present for a few days. Described as a sharp aching pain. Patient has received relief from pain medication. Patient claims his in pain every day but at baseline of 4 out of 10, when pain goes up to 6 out of 10 he takes a little more of his pain medication. Patient has no record of ED visit for hospital admission at any [facility] system for the past 6 months. Patient claims he is doing well at home. 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 He lives alone, smokes cigarette about 4-5 cigarettes per day and marijuana occasionally, he denies alcohol use. Patient also complains of trouble sleeping. Patient began taking Tylenol PM a few weeks ago. Patient was able to sleep for first couple days and received no relief since. Patient has No headache, No chest pain, No abdominal pain - No Nausea, No new weakness tingling or numbness, No Cough - SOB.  Problem  Chronic Back Pain Greater Than 3 Months Duration  Sickle Cell Anemia With Pain (Hcc)    ALLERGIES: Allergies  Allergen Reactions  . Amoxicillin Diarrhea    PAST MEDICAL HISTORY: Past Medical History  Diagnosis Date  . Sickle cell anemia (HCC)   . Pneumonia     MEDICATIONS AT HOME: Prior to Admission medications   Medication Sig Start Date End Date Taking? Authorizing Provider  folic acid (FOLVITE) 1 MG tablet Take 1 mg by mouth daily.   Yes Historical Provider, MD  gabapentin (NEURONTIN) 300 MG capsule Take 1 capsule (300 mg total) by mouth 2 (two) times daily. 09/07/13  Yes  Leana Gamer, MD  hydroxyurea (HYDREA) 500 MG capsule Take 3 capsules (1,500 mg total) by mouth daily. May take with food to minimize GI side effects. 04/09/15  Yes Tresa Garter, MD  ibuprofen (ADVIL,MOTRIN) 600 MG tablet Take 1 tablet (600 mg total) by mouth every 8 (eight) hours as needed for mild pain or moderate pain. 10/30/14  Yes Dorena Dew, FNP  methadone (DOLOPHINE) 5 MG tablet [facility] 04/04/15  Yes Micheline Chapman, NP  Multiple Vitamin (MULTIVITAMIN) tablet Take 2 tablets by mouth daily.    Yes Historical Provider, MD  oxyCODONE (ROXICODONE) 15 MG immediate release tablet Take 1 tablet (15 mg total) by mouth every 6 (six) hours as needed for pain. 03/25/15  Yes Micheline Chapman, NP  triamcinolone (KENALOG) 0.025 % ointment Apply 1 application topically 2 (two) times daily. Patient not taking: Reported on 08/30/2014 07/03/14   Dorena Dew, FNP     Objective:   Filed Vitals:   04/09/15 1155  BP: 115/72  Pulse: 79  Temp: 98.4 F (36.9 C)  TempSrc: Oral  Resp: 18  Height: 5\' 7"  (1.702 m)  Weight: 132 lb (59.875 kg)    Exam General appearance : Awake, alert, not in any distress. Speech Clear. Not toxic looking HEENT: Atraumatic and Normocephalic, pupils equally reactive to light and accomodation Neck:  supple, no JVD. No cervical lymphadenopathy.  Chest:Good air entry bilaterally, no added sounds  CVS: S1 S2 regular, no murmurs.  Abdomen: Bowel sounds present, Non tender and not distended with no gaurding, rigidity or rebound. Extremities: B/L Lower Ext shows no edema, both legs are warm to touch Neurology: Awake alert, and oriented X 3, CN II-XII intact, Non focal Skin:No Rash  Data Review Lab Results  Component Value Date   HGBA1C <4.0 09/07/2013     Assessment & Plan   1. Chronic back pain greater than 3 months duration Acute and chronic painful episodes - We agreed on Opiate dose and amount of pills  per month. We discussed that pt is to  receive Schedule II prescriptions only from our clinic. Pt is also aware that the prescription history is available to [place] online through the [facility] CSRS. Controlled substance agreement reviewed and signed. We reminded Cl[place] that all patients receiving Schedule II narcotics must be seen for follow within one month of prescription being requested. We reviewed the terms of our pain agreement, including the need to keep medicines in a safe locked location away from children or pets, and the need to report excess sedation or constipation, measures to avoid constipation, and policies related to early refills and stolen prescriptions. According to the [place] [facility] program, we have reviewed details related to analgesia, adverse effects and aberrant behaviors.  Patient will be due for refill of his short acting pain medication in about 1 week.  2. Sickle cell anemia with pain (HCC)  - hydroxyurea (HYDREA) 500 MG capsule; Take 3 capsules (1,500 mg total) by mouth daily. May take with food to minimize GI side effects.  Dispense: 90 capsule; Refill: 3 - CBC with Differential; Future - COMPLETE METABOLIC PANEL WITH GFR; Future   General Management: a. Sickle cell disease - Continue Hydrea. We discussed the need for good hydration, monitoring of hydration status, avoidance of heat, cold, stress, and infection triggers. We discussed the risks and benefits of Hydrea, including bone marrow suppression, the possibility of GI upset, skin ulcers, hair thinning, and teratogenicity. The patient was reminded of the need to seek medical attention of any symptoms of bleeding, anemia, or infection. Continue folic acid 1 mg daily to prevent aplastic bone marrow crises.   b. Pulmonary evaluation - Patient denies severe recurrent wheezes, shortness of breath with exercise, or persistent cough. If these symptoms develop, pulmonary function tests with spirometry will be ordered, and if abnormal, plan on referral to  Pulmonology for further evaluation.  c. Cardiac - Routine screening for pulmonary hypertension is not recommended.  d. Eye - High risk of proliferative retinopathy. Annual eye exam with retinal exam recommended to patient, the patient has had eye exam this year.  e. Immunization status - Patient is up to date with influenza, Meningococcal and Pneumococcal vaccines.   Patient have been counseled extensively about nutrition and exercise  Return in about 3 months (around 07/10/2015), or if symptoms worsen or fail to improve, for Sickle Cell Disease/Pain.  The patient was given clear instructions to go to ER or return to medical center if symptoms don't improve, worsen or new problems develop. The patient verbalized understanding. The patient was told to call to get lab results if they haven't heard anything in the next week.   This note has been created with Surveyor, quantity. Any transcriptional errors are unintentional.    Trenee Igoe, MD, MHA, FACP, FAAP, CPE [facility] [place], [place]   04/09/2015, 2:57 PM

## 2015-04-09 NOTE — Progress Notes (Signed)
Patient here for F/U  Patient complains of pain in his lower back and sides, pain is scaled at a 6. Pain has been present for a few days. Described as a sharp aching pain. Patient has received relief from pain medication.  Patient complains of trouble sleeping. Patient began taking Tylenol PM a few weeks ago. Patient was able to sleep for first couple days and received no relief since.

## 2015-04-12 ENCOUNTER — Telehealth: Payer: Self-pay | Admitting: Family Medicine

## 2015-04-12 ENCOUNTER — Ambulatory Visit: Payer: Medicare Other | Admitting: Family Medicine

## 2015-04-12 NOTE — Telephone Encounter (Signed)
Refill request for oxycodone 15mg . LOV 04/09/2015. Please advise. Thanks!

## 2015-04-16 ENCOUNTER — Other Ambulatory Visit: Payer: Self-pay | Admitting: *Deleted

## 2015-04-16 ENCOUNTER — Other Ambulatory Visit: Payer: Medicare Other

## 2015-04-16 DIAGNOSIS — D5700 Hb-SS disease with crisis, unspecified: Secondary | ICD-10-CM

## 2015-04-16 DIAGNOSIS — D571 Sickle-cell disease without crisis: Secondary | ICD-10-CM

## 2015-04-16 DIAGNOSIS — G894 Chronic pain syndrome: Secondary | ICD-10-CM

## 2015-04-16 LAB — COMPLETE METABOLIC PANEL WITH GFR
ALT: 22 U/L (ref 9–46)
AST: 37 U/L (ref 10–40)
Albumin: 4.3 g/dL (ref 3.6–5.1)
Alkaline Phosphatase: 49 U/L (ref 40–115)
BILIRUBIN TOTAL: 1.1 mg/dL (ref 0.2–1.2)
BUN: 6 mg/dL — ABNORMAL LOW (ref 7–25)
CO2: 24 mmol/L (ref 20–31)
Calcium: 9.2 mg/dL (ref 8.6–10.3)
Chloride: 102 mmol/L (ref 98–110)
Creat: 0.56 mg/dL — ABNORMAL LOW (ref 0.60–1.35)
Glucose, Bld: 85 mg/dL (ref 65–99)
POTASSIUM: 4.2 mmol/L (ref 3.5–5.3)
Sodium: 136 mmol/L (ref 135–146)
Total Protein: 7.7 g/dL (ref 6.1–8.1)

## 2015-04-16 MED ORDER — OXYCODONE HCL 15 MG PO TABS: 15.0000 mg | ORAL_TABLET | Freq: Four times a day (QID) | ORAL | Status: DC | PRN

## 2015-04-16 NOTE — Telephone Encounter (Signed)
Refilled patients pain med. No refills.

## 2015-04-17 ENCOUNTER — Telehealth: Payer: Self-pay | Admitting: *Deleted

## 2015-04-17 LAB — CBC WITH DIFFERENTIAL/PLATELET
Basophils Absolute: 0.1 10*3/uL (ref 0.0–0.1)
Basophils Relative: 1 % (ref 0–1)
EOS ABS: 0.2 10*3/uL (ref 0.0–0.7)
EOS PCT: 2 % (ref 0–5)
HCT: 27.1 % — ABNORMAL LOW (ref 39.0–52.0)
Hemoglobin: 9.1 g/dL — ABNORMAL LOW (ref 13.0–17.0)
LYMPHS ABS: 4 10*3/uL (ref 0.7–4.0)
Lymphocytes Relative: 35 % (ref 12–46)
MCH: 29.2 pg (ref 26.0–34.0)
MCHC: 33.6 g/dL (ref 30.0–36.0)
MCV: 86.9 fL (ref 78.0–100.0)
MONOS PCT: 10 % (ref 3–12)
MPV: 9.3 fL (ref 8.6–12.4)
Monocytes Absolute: 1.2 10*3/uL — ABNORMAL HIGH (ref 0.1–1.0)
Neutro Abs: 6 10*3/uL (ref 1.7–7.7)
Neutrophils Relative %: 52 % (ref 43–77)
Platelets: 550 10*3/uL — ABNORMAL HIGH (ref 150–400)
RBC: 3.12 MIL/uL — ABNORMAL LOW (ref 4.22–5.81)
RDW: 18.3 % — ABNORMAL HIGH (ref 11.5–15.5)
WBC: 11.5 10*3/uL — ABNORMAL HIGH (ref 4.0–10.5)

## 2015-04-17 NOTE — Telephone Encounter (Signed)
Patient verified DOB Patient informed of hemoglobin being stable and platelets being slightly high. Patient advised to follow-up with the clinic regularly to have his blood monitored and to call if he has any concerns or questions. Patient expressed his understanding. Patient agreed to follow up every 3 months.

## 2015-04-17 NOTE — Telephone Encounter (Signed)
-----   Message from Tresa Garter, MD sent at 04/17/2015  9:24 AM EDT ----- Please inform patient that his hemoglobin is stable, platelets slightly high, we will monitor, other results are mostly normal. Advise patient of regular follow-up in the clinic and call us for any concern or question.

## 2015-04-27 IMAGING — CR DG CHEST 2V
2 series · 2 of 2 positions shown · non-contrast
Comparison: 08/02/2013

CLINICAL DATA: Sickle cell disease, crisis, leukocytosis

EXAM:
CHEST  2 VIEW

[w chest pa]
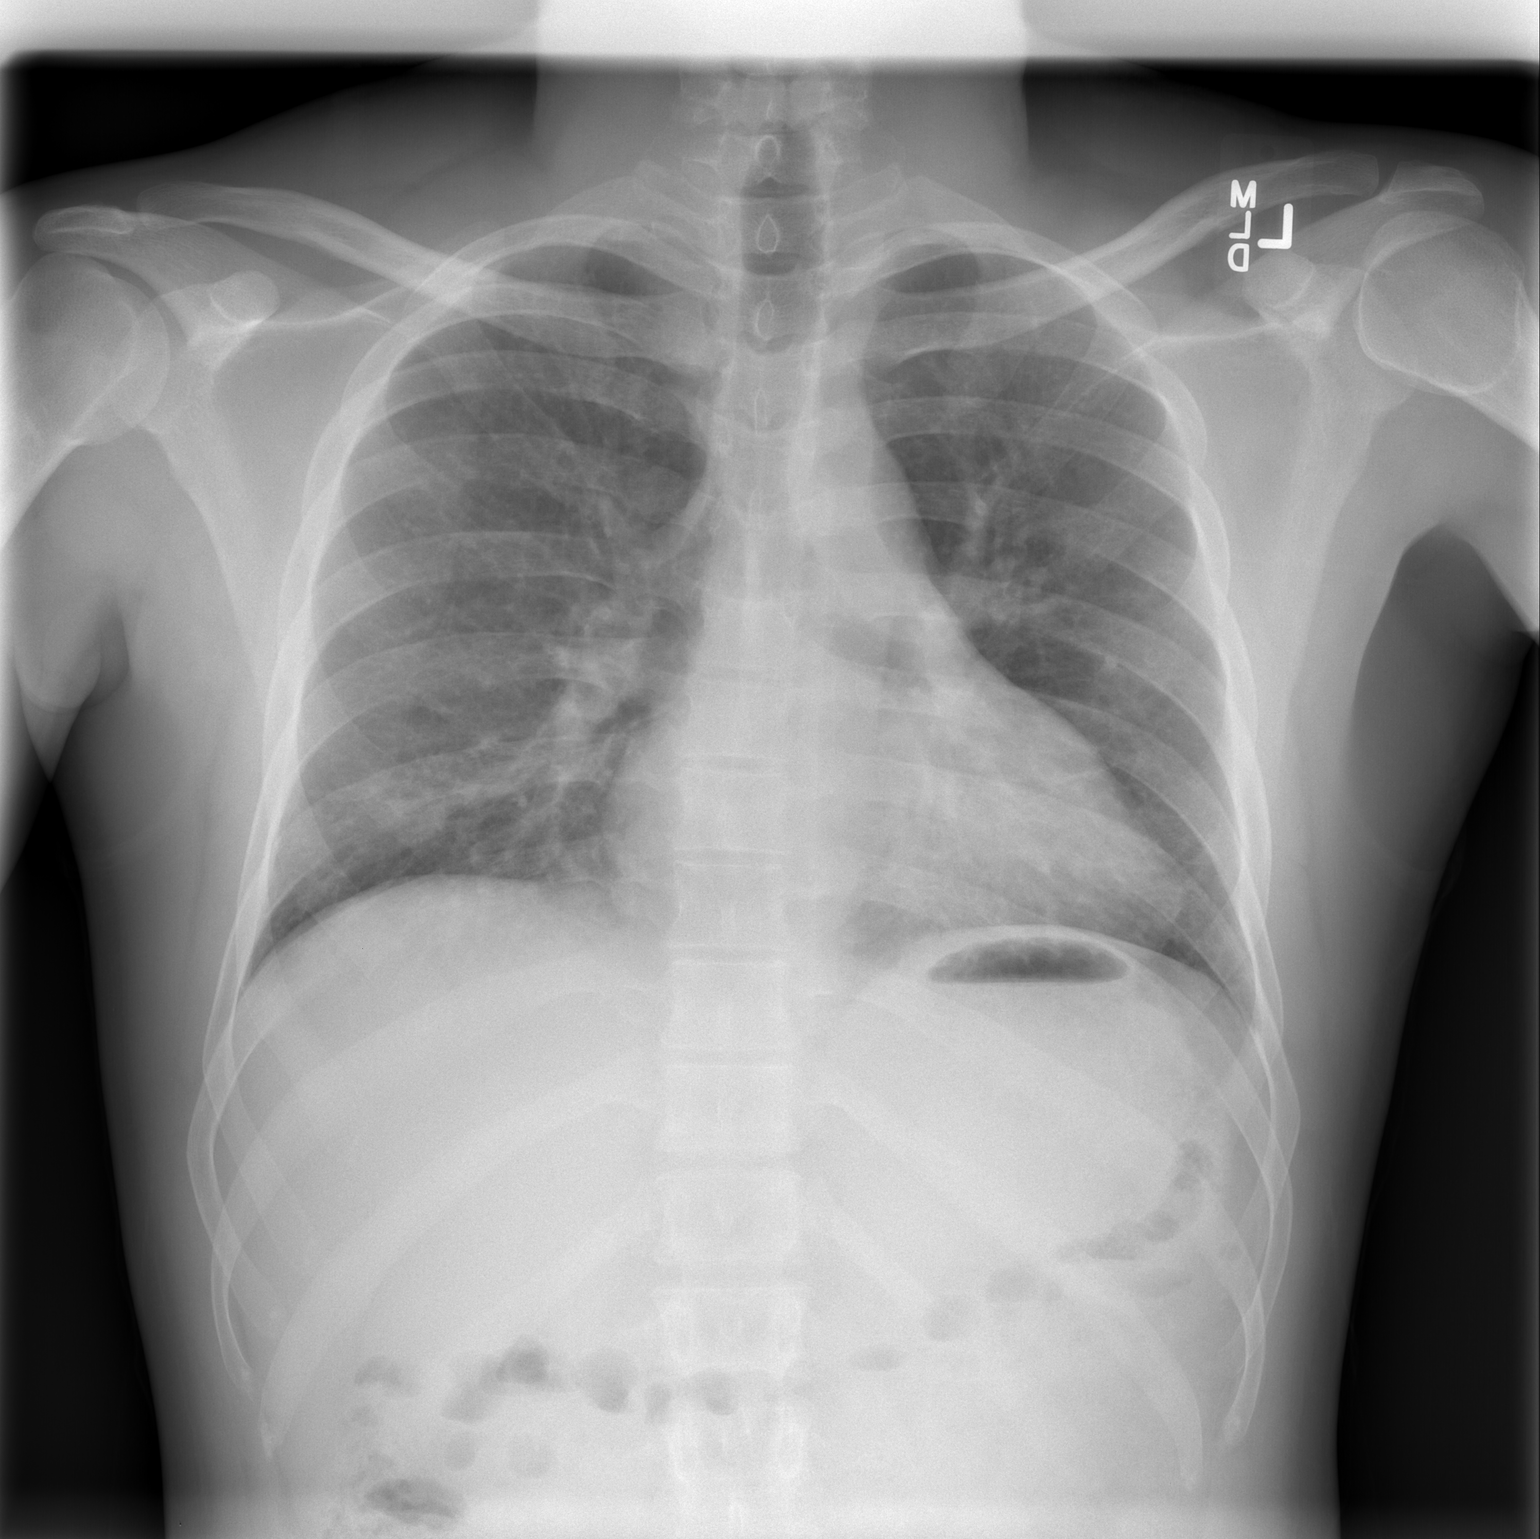

[w chest lat]
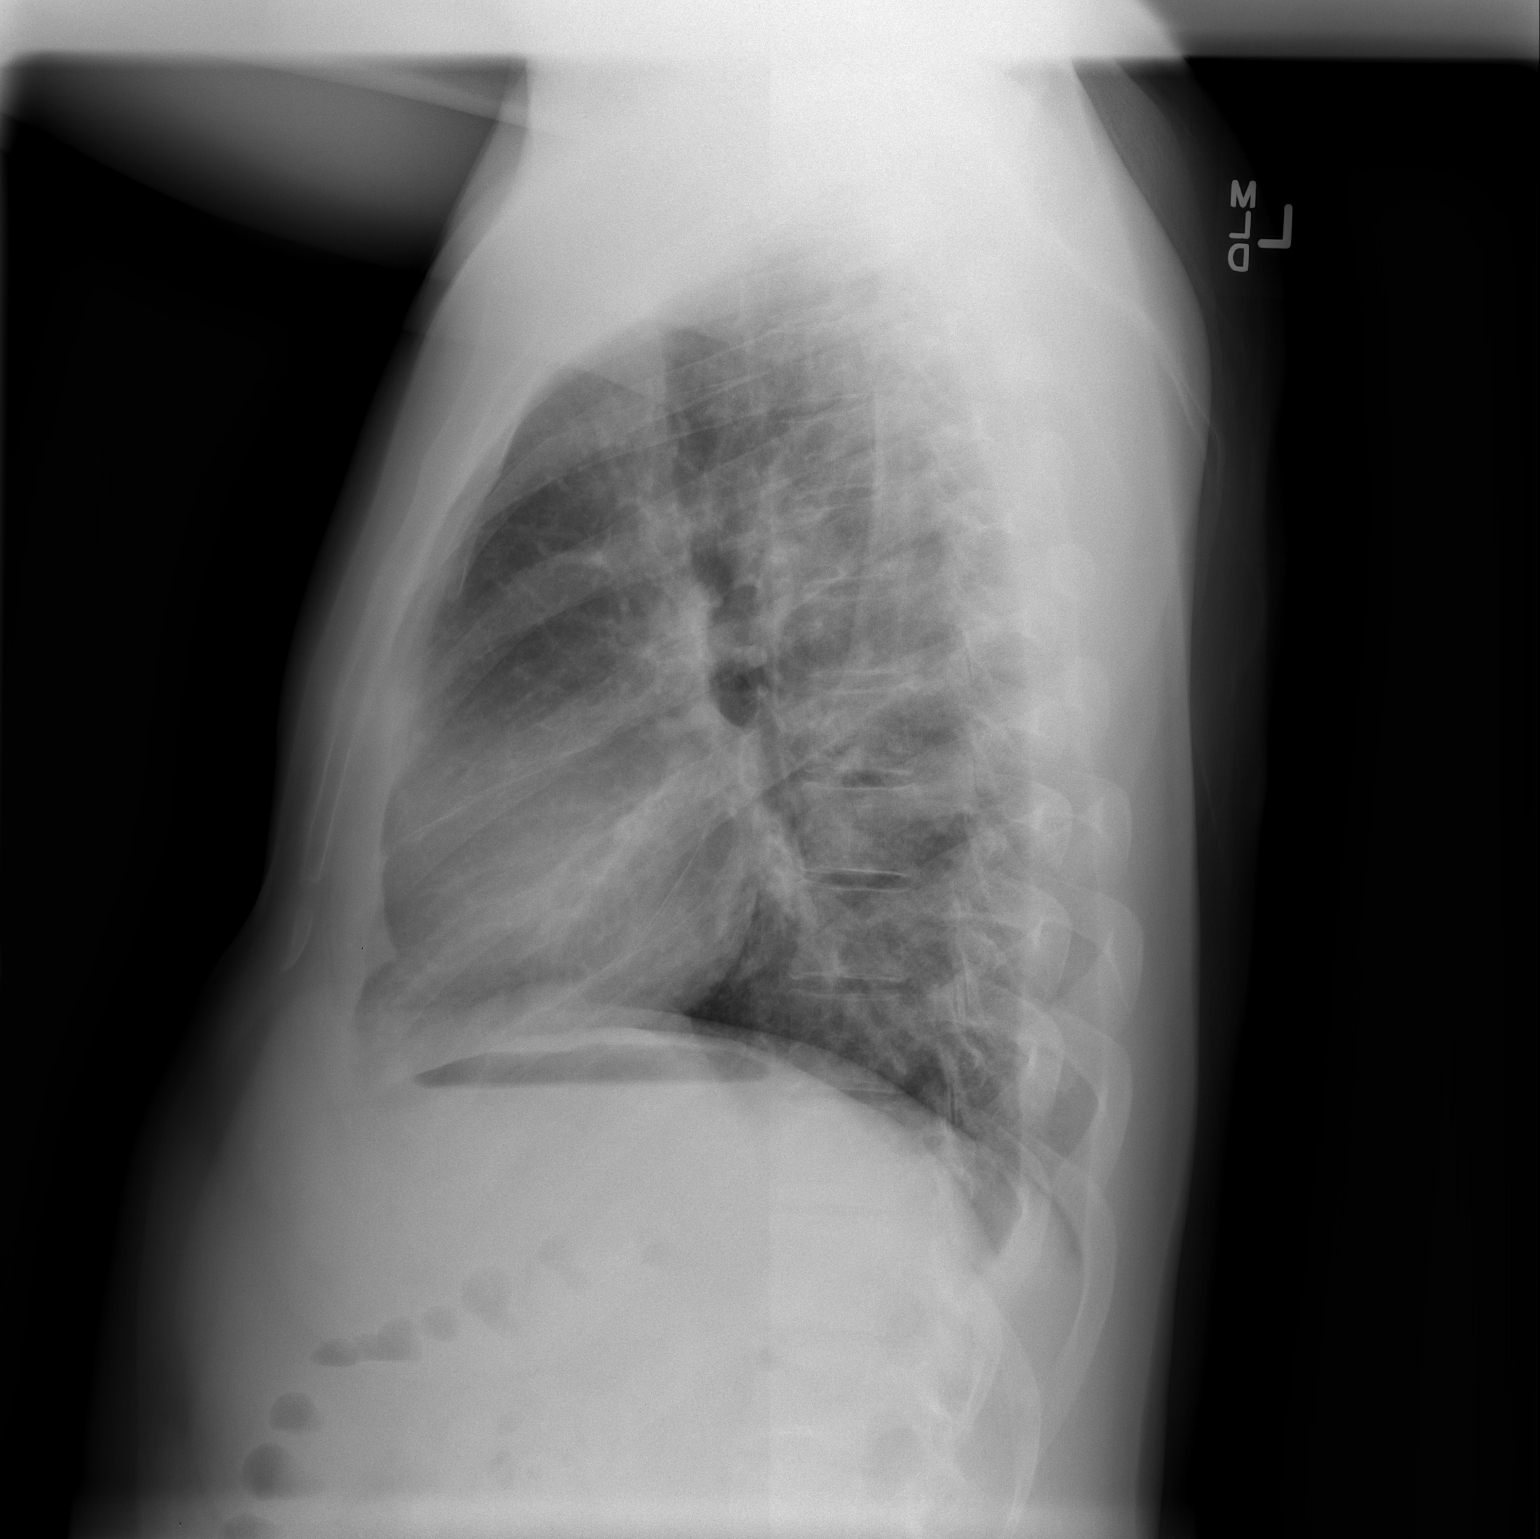

[2 of 2 positions shown; findings below may reference images not displayed]

FINDINGS: Minimal enlargement of cardiac silhouette.

Mediastinal contours and pulmonary vascularity normal.

Slight decreased lung was versus previous exam with minimal
atelectasis at bases.

No definite acute infiltrate, pleural effusion or pneumothorax.

Bones stable.
IMPRESSION: Minimal enlargement of cardiac silhouette and bibasilar atelectasis.

## 2015-05-03 ENCOUNTER — Telehealth: Payer: Self-pay | Admitting: Internal Medicine

## 2015-05-06 NOTE — Telephone Encounter (Signed)
Refill request for Oxycodone 15mg . And Methadone 5mg . LOV 04/09/2015. Please advise. Thanks!

## 2015-05-07 ENCOUNTER — Other Ambulatory Visit: Payer: Self-pay | Admitting: Internal Medicine

## 2015-05-07 DIAGNOSIS — D571 Sickle-cell disease without crisis: Secondary | ICD-10-CM

## 2015-05-07 DIAGNOSIS — G894 Chronic pain syndrome: Secondary | ICD-10-CM

## 2015-05-07 MED ORDER — OXYCODONE HCL 15 MG PO TABS: 15.0000 mg | ORAL_TABLET | Freq: Four times a day (QID) | ORAL | Status: DC | PRN

## 2015-05-07 MED ORDER — METHADONE HCL 5 MG PO TABS: ORAL_TABLET | ORAL | Status: DC

## 2015-05-10 ENCOUNTER — Ambulatory Visit: Payer: Medicare Other | Admitting: Family Medicine

## 2015-05-14 ENCOUNTER — Ambulatory Visit: Payer: Medicare Other | Admitting: Family Medicine

## 2015-06-04 ENCOUNTER — Encounter: Payer: Self-pay | Admitting: Family Medicine

## 2015-06-04 ENCOUNTER — Ambulatory Visit (INDEPENDENT_AMBULATORY_CARE_PROVIDER_SITE_OTHER): Payer: Medicare Other | Admitting: Family Medicine

## 2015-06-04 VITALS — BP 111/74 | HR 68 | Temp 98.1°F | Resp 16 | Ht 67.0 in | Wt 138.0 lb

## 2015-06-04 DIAGNOSIS — G894 Chronic pain syndrome: Secondary | ICD-10-CM

## 2015-06-04 DIAGNOSIS — F17200 Nicotine dependence, unspecified, uncomplicated: Secondary | ICD-10-CM

## 2015-06-04 DIAGNOSIS — D571 Sickle-cell disease without crisis: Secondary | ICD-10-CM

## 2015-06-04 LAB — POCT URINALYSIS DIP (DEVICE)
Bilirubin Urine: NEGATIVE
GLUCOSE, UA: NEGATIVE mg/dL
Hgb urine dipstick: NEGATIVE
Ketones, ur: NEGATIVE mg/dL
LEUKOCYTES UA: NEGATIVE
Nitrite: NEGATIVE
Protein, ur: NEGATIVE mg/dL
Specific Gravity, Urine: 1.015 (ref 1.005–1.030)
UROBILINOGEN UA: 0.2 mg/dL (ref 0.0–1.0)
pH: 7 (ref 5.0–8.0)

## 2015-06-04 MED ORDER — OXYCODONE HCL 15 MG PO TABS: 15.0000 mg | ORAL_TABLET | Freq: Four times a day (QID) | ORAL | Status: DC | PRN

## 2015-06-04 MED ORDER — METHADONE HCL 5 MG PO TABS: ORAL_TABLET | ORAL | Status: DC

## 2015-06-04 NOTE — Progress Notes (Signed)
Subjective:    Patient ID: Alejandro Soto, male    DOB: Sep 04, 1985, 29 y.o.   MRN: [id number]  HPI Alejandro Soto, a 29 year old male with a history of sickle cell anemia, HbSS presents for follow up of sickle cell anemia and chronic pain. Alejandro Soto states that he currently has pain to his back, upper and lower extremities. Patient maintains that current pain intensity is 5/10 described as constant and aching. Patient states that he is taking all medications consistently. Alejandro Soto denies headache, chest pains, shortness of breath, fatigue, nausea, vomiting or diarrhea.   Past Medical History  Diagnosis Date  . Sickle cell anemia (HCC)   . Pneumonia    Social History   Social History  . Marital Status: Single    Spouse Name: N/A  . Number of Children: N/A  . Years of Education: N/A   Occupational History  . Not on file.   Social History Main Topics  . Smoking status: Current Some Day Smoker -- 0.25 packs/day for 5 years    Types: Cigarettes  . Smokeless tobacco: Never Used  . Alcohol Use: No  . Drug Use: No  . Sexual Activity: Yes   Other Topics Concern  . Not on file   Social History Narrative   Immunization History  Administered Date(s) Administered  . Influenza Whole 03/21/2012  . Influenza,inj,Quad PF,36+ Mos 03/21/2013, 03/01/2014  . Pneumococcal Conjugate-13 03/21/2012  . Pneumococcal Polysaccharide-23 07/15/2014   Review of Systems  Constitutional: Negative.   HENT: Negative.   Eyes: Negative.   Respiratory: Negative.   Cardiovascular: Negative.   Gastrointestinal: Negative.   Endocrine: Negative.   Genitourinary: Negative.   Musculoskeletal: Positive for myalgias (low extremity pain) and back pain.  Skin: Negative.   Allergic/Immunologic: Negative.   Neurological: Negative.   Hematological: Negative.   Psychiatric/Behavioral: Negative.       Objective:   Physical Exam  Constitutional: He is oriented to person, place, and time. He appears  well-developed and well-nourished.  HENT:  Head: Normocephalic and atraumatic.  Right Ear: External ear normal.  Left Ear: External ear normal.  Nose: Nose normal.  Mouth/Throat: Oropharynx is clear and moist.  Eyes: Conjunctivae and EOM are normal. Pupils are equal, round, and reactive to light.  Neck: Normal range of motion. Neck supple.  Cardiovascular: Normal rate, regular rhythm, normal heart sounds and intact distal pulses.   Pulmonary/Chest: Effort normal and breath sounds normal.  Abdominal: Soft. Bowel sounds are normal.  Musculoskeletal: Normal range of motion.  Neurological: He is alert and oriented to person, place, and time. He has normal reflexes.  Skin: Skin is warm and dry.  Psychiatric: He has a normal mood and affect. His behavior is normal. Judgment and thought content normal.       BP 111/74 mmHg  Pulse 68  Temp(Src) 98.1 F (36.7 C) (Oral)  Resp 16  Ht 5\' 7"  (1.702 m)  Wt 138 lb (62.596 kg)  BMI 21.61 kg/m2 Assessment & Plan:   1. Sickle cell disease, type SS Sickle cell disease - Continue Hydrea 1500 mg, 30 mg/kg/day. Discussed possibility of increasing.  Will check CBC w/differential today. Discussed possible increase of Hydrea.  We discussed the need for good hydration, monitoring of hydration status, avoidance of heat, cold, stress, and infection triggers. We discussed the risks and benefits of Hydrea, including bone marrow suppression, the possibility of GI upset, skin ulcers, hair thinning, and teratogenicity. The patient was reminded of the need to seek  medical attention of any symptoms of bleeding, anemia, or infection. Continue folic acid 1 mg daily to prevent aplastic bone marrow crises.   Pulmonary evaluation - Patient denies severe recurrent wheezes, shortness of breath with exercise, or persistent cough. If these symptoms develop, pulmonary function tests with spirometry will be ordered, and if abnormal, plan on referral to Pulmonology for further  evaluation.  Cardiac - Routine screening for pulmonary hypertension is not recommended.  Eye - High risk of proliferative retinopathy. Annual eye exam with retinal exam recommended to patient. Sent referral, patient has missed last several appointments.    Immunization status - Alejandro Soto is up to date with vaccinations.  - POCT urinalysis dipstick - Reticulocytes - CBC with Differential - oxyCODONE (ROXICODONE) 15 MG immediate release tablet; Take 1 tablet (15 mg total) by mouth every 6 (six) hours as needed for pain.  Dispense: 90 tablet; Refill: 0 - methadone (DOLOPHINE) 5 MG tablet; At [facility]  Dispense: 30 tablet; Refill: 0 - Prescription Monitoring Profile (17)-[facility]   2. Chronic pain syndrome  We agreed on weaning his current medication dose. We discussed that Alejandro Soto  Schedule II prescriptions only from [place]. Reviewed [place] Substance Reporting System, discussed Rx from for Oxycodone 15 mg every 6 hours prn # 90. Patient reports that he was evaluated in the emergency department in [place]. He states that he was unable to get to primary physician in [place] due to transportation constraints. Pt is also aware that the prescription history is available to [place] online through the [facility] CSRS. Controlled substance agreement signed. We reminded Alejandro Soto that all patients receiving Schedule II narcotics must be seen for follow within one month of prescription being requested. We reviewed the terms of our pain agreement, including the need to keep medicines in a safe locked location away from children or pets, and the need to report excess sedation or constipation, measures to avoid constipation, and policies related to early refills and stolen prescriptions. According to the [place] [facility] program, we have reviewed details related to analgesia, adverse effects, aberrant behaviors. Patient was previously referred to pain management.   - oxyCODONE (ROXICODONE) 15 MG immediate release tablet;  Take 1 tablet (15 mg total) by mouth every 6 (six) hours as needed for pain.  Dispense: 90 tablet; Refill: 0 - Prescription Monitoring Profile (17)-[facility]   3. Tobacco dependence Alejandro Soto is in the pre-contemplative state. Smoking cessation instruction/counseling given:  counseled patient on the dangers of tobacco use, advised patient to stop smoking, and reviewed strategies to maximize success     RTC: Patient will follow up in 1 month for medication management Dorena Dew, FNP

## 2015-06-04 NOTE — Patient Instructions (Signed)
Chronic Pain °Chronic pain can be defined as pain that is off and on and lasts for 3-6 months or longer. Many things cause chronic pain, which can make it difficult to make a diagnosis. There are many treatment options available for chronic pain. However, finding a treatment that works well for you may require trying various approaches until the right one is found. Many people benefit from a combination of two or more types of treatment to control their pain. °SYMPTOMS  °Chronic pain can occur anywhere in the body and can range from mild to very severe. Some types of chronic pain include: °· Headache. °· Low back pain. °· Cancer pain. °· Arthritis pain. °· Neurogenic pain. This is pain resulting from damage to nerves. ° People with chronic pain may also have other symptoms such as: °· Depression. °· Anger. °· Insomnia. °· Anxiety. °DIAGNOSIS  °Your health care provider will help diagnose your condition over time. In many cases, the initial focus will be on excluding possible conditions that could be causing the pain. Depending on your symptoms, your health care provider may order tests to diagnose your condition. Some of these tests may include:  °· Blood tests.   °· CT scan.   °· MRI.   °· X-rays.   °· Ultrasounds.   °· Nerve conduction studies.   °You may need to see a specialist.  °TREATMENT  °Finding treatment that works well may take time. You may be referred to a pain specialist. He or she may prescribe medicine or therapies, such as:  °· Mindful meditation or yoga. °· Shots (injections) of numbing or pain-relieving medicines into the spine or area of pain. °· Local electrical stimulation. °· Acupuncture.   °· Massage therapy.   °· Aroma, color, light, or sound therapy.   °· Biofeedback.   °· Working with a physical therapist to keep from getting stiff.   °· Regular, gentle exercise.   °· Cognitive or behavioral therapy.   °· Group support.   °Sometimes, surgery may be recommended.  °HOME CARE INSTRUCTIONS   °· Take all medicines as directed by your health care provider.   °· Lessen stress in your life by relaxing and doing things such as listening to calming music.   °· Exercise or be active as directed by your health care provider.   °· Eat a healthy diet and include things such as vegetables, fruits, fish, and lean meats in your diet.   °· Keep all follow-up appointments with your health care provider.   °· Attend a support group with others suffering from chronic pain. °SEEK MEDICAL CARE IF:  °· Your pain gets worse.   °· You develop a new pain that was not there before.   °· You cannot tolerate medicines given to you by your health care provider.   °· You have new symptoms since your last visit with your health care provider.   °SEEK IMMEDIATE MEDICAL CARE IF:  °· You feel weak.   °· You have decreased sensation or numbness.   °· You lose control of bowel or bladder function.   °· Your pain suddenly gets much worse.   °· You develop shaking. °· You develop chills. °· You develop confusion. °· You develop chest pain. °· You develop shortness of breath.   °MAKE SURE YOU: °· Understand these instructions. °· Will watch your condition. °· Will get help right away if you are not doing well or get worse. °  °This information is not intended to replace advice given to you by your health care provider. Make sure you discuss any questions you have with your health care provider. °  °Document Released: 02/28/2002   Document Revised: 02/08/2013 Document Reviewed: 12/02/2012 °Elsevier Interactive Patient Education ©2016 Elsevier Inc[employer] °Sickle Cell Anemia, Adult °Sickle cell anemia is a condition in which red blood cells have an abnormal "sickle" shape. This abnormal shape shortens the cells' life span, which results in a lower than normal concentration of red blood cells in the blood. The sickle shape also causes the cells to clump together and block free blood flow through the blood vessels. As a result, the tissues and organs  of the body do not receive enough oxygen. Sickle cell anemia causes organ damage and pain and increases the risk of infection. °CAUSES  °Sickle cell anemia is a genetic disorder. Those who receive two copies of the gene have the condition, and those who receive one copy have the trait. °RISK FACTORS °The sickle cell gene is most common in people whose families originated in [place]. Other areas of the globe where sickle cell trait occurs include the Mediterranean, [place], the [place], and the [place].  °SIGNS AND SYMPTOMS °· Pain, especially in the extremities, back, chest, or abdomen (common). The pain may start suddenly or may develop following an illness, especially if there is dehydration. Pain can also occur due to overexertion or exposure to extreme temperature changes. °· Frequent severe bacterial infections, especially certain types of pneumonia and meningitis. °· Pain and swelling in the hands and feet. °· Decreased activity.   °· Loss of appetite.   °· Change in behavior. °· Headaches. °· Seizures. °· Shortness of breath or difficulty breathing. °· Vision changes. °· Skin ulcers. °Those with the trait may not have symptoms or they may have mild symptoms.  °DIAGNOSIS  °Sickle cell anemia is diagnosed with blood tests that demonstrate the genetic trait. It is often diagnosed during the newborn period, due to mandatory testing nationwide. A variety of blood tests, X-rays, CT scans, MRI scans, ultrasounds, and lung function tests may also be done to monitor the condition. °TREATMENT  °Sickle cell anemia may be treated with: °· Medicines. You may be given pain medicines, antibiotic medicines (to treat and prevent infections) or medicines to increase the production of certain types of hemoglobin. °· Fluids. °· Oxygen. °· Blood transfusions. °HOME CARE INSTRUCTIONS  °· Drink enough fluid to keep your urine clear or pale yellow. Increase your fluid intake in hot weather and during  exercise. °· Do not smoke. Smoking lowers oxygen levels in the blood.   °· Only take over-the-counter or prescription medicines for pain, fever, or discomfort as directed by your health care provider. °· Take antibiotics as directed by your health care provider. Make sure you finish them it even if you start to feel better.   °· Take supplements as directed by your health care provider.   °· Consider wearing a medical alert bracelet. This tells anyone caring for you in an emergency of your condition.   °· When traveling, keep your medical information, health care provider's names, and the medicines you take with you at all times.   °· If you develop a fever, do not take medicines to reduce the fever right away. This could cover up a problem that is developing. Notify your health care provider. °· Keep all follow-up appointments with your health care provider. Sickle cell anemia requires regular medical care. °SEEK MEDICAL CARE IF: ° You have a fever. °SEEK IMMEDIATE MEDICAL CARE IF:  °· You feel dizzy or faint.   °· You have new abdominal pain, especially on the left side near the stomach area.   °· You develop a persistent,   often uncomfortable and painful penile erection (priapism). If this is not treated immediately it will lead to impotence.   °· You have numbness your arms or legs or you have a hard time moving them.   °· You have a hard time with speech.   °· You have a fever or persistent symptoms for more than 2-3 days.   °· You have a fever and your symptoms suddenly get worse.   °· You have signs or symptoms of infection. These include:   °¨ Chills.   °¨ Abnormal tiredness (lethargy).   °¨ Irritability.   °¨ Poor eating.   °¨ Vomiting.   °· You develop pain that is not helped with medicine.   °· You develop shortness of breath. °· You have pain in your chest.   °· You are coughing up pus-like or bloody sputum.   °· You develop a stiff neck. °· Your feet or hands swell or have pain. °· Your abdomen appears  bloated. °· You develop joint pain. °MAKE SURE YOU: °· Understand these instructions. °  °This information is not intended to replace advice given to you by your health care provider. Make sure you discuss any questions you have with your health care provider. °  °Document Released: 09/16/2005 Document Revised: 06/29/2014 Document Reviewed: 01/18/2013 °Elsevier Interactive Patient Education ©2016 Elsevier Inc. ° °

## 2015-06-05 LAB — CBC WITH DIFFERENTIAL/PLATELET
Basophils Absolute: 0 10*3/uL (ref 0.0–0.1)
Basophils Relative: 0 % (ref 0–1)
EOS ABS: 0.2 10*3/uL (ref 0.0–0.7)
Eosinophils Relative: 2 % (ref 0–5)
HCT: 30.4 % — ABNORMAL LOW (ref 39.0–52.0)
Hemoglobin: 10.5 g/dL — ABNORMAL LOW (ref 13.0–17.0)
Lymphocytes Relative: 32 % (ref 12–46)
Lymphs Abs: 3.6 10*3/uL (ref 0.7–4.0)
MCH: 32.1 pg (ref 26.0–34.0)
MCHC: 34.5 g/dL (ref 30.0–36.0)
MCV: 93 fL (ref 78.0–100.0)
MONOS PCT: 7 % (ref 3–12)
MPV: 9.1 fL (ref 8.6–12.4)
Monocytes Absolute: 0.8 10*3/uL (ref 0.1–1.0)
NEUTROS PCT: 59 % (ref 43–77)
Neutro Abs: 6.5 10*3/uL (ref 1.7–7.7)
Platelets: 489 10*3/uL — ABNORMAL HIGH (ref 150–400)
RBC: 3.27 MIL/uL — AB (ref 4.22–5.81)
RDW: 17.4 % — ABNORMAL HIGH (ref 11.5–15.5)
WBC: 11.1 10*3/uL — AB (ref 4.0–10.5)

## 2015-06-05 LAB — RETICULOCYTES
ABS Retic: 189.7 10*3/uL — ABNORMAL HIGH (ref 19.0–186.0)
RBC.: 3.27 MIL/uL — AB (ref 4.22–5.81)
Retic Ct Pct: 5.8 % — ABNORMAL HIGH (ref 0.4–2.3)

## 2015-06-09 LAB — OPIATES/OPIOIDS (LC/MS-MS)
CODEINE URINE: 1662 ng/mL — AB (ref <50)
HYDROCODONE: NEGATIVE ng/mL (ref <50)
Hydromorphone: 157 ng/mL — AB (ref <50)
NORHYDROCODONE, UR: NEGATIVE ng/mL (ref <50)
NOROXYCODONE, UR: NEGATIVE ng/mL (ref <50)
Oxycodone, ur: NEGATIVE ng/mL (ref <50)
Oxymorphone: NEGATIVE ng/mL (ref <50)

## 2015-06-09 LAB — CANNABANOIDS (GC/LC/MS), URINE: THC-COOH UR CONFIRM: 56 ng/mL — AB (ref <5)

## 2015-06-11 LAB — PRESCRIPTION MONITORING PROFILE (SOLSTAS)
AMPHETAMINE/METH: NEGATIVE ng/mL
BENZODIAZEPINE SCREEN, URINE: NEGATIVE ng/mL
Barbiturate Screen, Urine: NEGATIVE ng/mL
Buprenorphine, Urine: NEGATIVE ng/mL
CREATININE, URINE: 74.15 mg/dL (ref >20.0)
Carisoprodol, Urine: NEGATIVE ng/mL
Cocaine Metabolites: NEGATIVE ng/mL
ECSTASY: NEGATIVE ng/mL
Fentanyl, Ur: NEGATIVE ng/mL
MEPERIDINE UR: NEGATIVE ng/mL
METHADONE SCREEN, URINE: NEGATIVE ng/mL
Nitrites, Initial: NEGATIVE ug/mL
OXYCODONE SCRN UR: NEGATIVE ng/mL
Propoxyphene: NEGATIVE ng/mL
Tapentadol, urine: NEGATIVE ng/mL
Tramadol Scrn, Ur: NEGATIVE ng/mL
Zolpidem, Urine: NEGATIVE ng/mL
pH, Initial: 6.6 pH (ref 4.5–8.9)

## 2015-06-21 ENCOUNTER — Telehealth: Payer: Self-pay | Admitting: Family Medicine

## 2015-06-21 DIAGNOSIS — D571 Sickle-cell disease without crisis: Secondary | ICD-10-CM

## 2015-06-21 DIAGNOSIS — G894 Chronic pain syndrome: Secondary | ICD-10-CM

## 2015-06-21 NOTE — Telephone Encounter (Signed)
Refill request for Oxycodone 15mg  LOV 06/04/2015. Please advise. Thanks!

## 2015-06-22 MED ORDER — OXYCODONE HCL 15 MG PO TABS: 15.0000 mg | ORAL_TABLET | Freq: Four times a day (QID) | ORAL | Status: DC | PRN

## 2015-06-22 NOTE — Telephone Encounter (Signed)
Reviewed [place] Substance Reporting system prior to prescribing opiate medications. No inconsistencies noted.   Meds ordered this encounter  Medications  . oxyCODONE (ROXICODONE) 15 MG immediate release tablet    Sig: Take 1 tablet (15 mg total) by mouth every 6 (six) hours as needed for pain.    Dispense:  90 tablet    Refill:  0    Order Specific Question:  Supervising Provider    Answer:  Tresa Garter [id number]    Dorena Dew, FNP

## 2015-06-25 ENCOUNTER — Other Ambulatory Visit: Payer: Self-pay | Admitting: Family Medicine

## 2015-06-25 DIAGNOSIS — G894 Chronic pain syndrome: Secondary | ICD-10-CM

## 2015-06-25 DIAGNOSIS — D571 Sickle-cell disease without crisis: Secondary | ICD-10-CM

## 2015-06-25 MED ORDER — OXYCODONE HCL 15 MG PO TABS: 15.0000 mg | ORAL_TABLET | Freq: Four times a day (QID) | ORAL | Status: DC | PRN

## 2015-06-25 MED FILL — oxyCODONE HCL 15 MG TABS: 15 | 22 days supply | Qty: 90 | Fill #0

## 2015-06-25 MED FILL — HYDROXYUREA 500 MG CAPSULE: 500 | 30 days supply | Qty: 90 | Fill #1

## 2015-06-25 NOTE — Progress Notes (Signed)
Previous Rx did not print, reprinted opiate Rx.   Dorena Dew, FNP

## 2015-07-02 ENCOUNTER — Telehealth: Payer: Self-pay | Admitting: Family Medicine

## 2015-07-02 DIAGNOSIS — D571 Sickle-cell disease without crisis: Secondary | ICD-10-CM

## 2015-07-02 MED ORDER — METHADONE HCL 5 MG PO TABS: ORAL_TABLET | ORAL | Status: DC

## 2015-07-02 NOTE — Telephone Encounter (Signed)
Reviewed [place] Substance Reporting system prior to prescribing opiate medications, no inconsistencies noted.   Meds ordered this encounter  Medications  . methadone (DOLOPHINE) 5 MG tablet    Sig: Take Methadone 5mg  at bedtime    Dispense:  30 tablet    Refill:  0    Rx not to be prescribed prior to 07/05/2015    Order Specific Question:  Supervising Provider    Answer:  Tresa Garter [id number]     Dorena Dew, FNP

## 2015-07-02 NOTE — Telephone Encounter (Signed)
Refill request for methadone 5mg . LOV 06/04/2015. Please advise. Thanks!

## 2015-07-05 ENCOUNTER — Ambulatory Visit: Payer: Medicare Other | Admitting: Family Medicine

## 2015-07-09 ENCOUNTER — Ambulatory Visit (INDEPENDENT_AMBULATORY_CARE_PROVIDER_SITE_OTHER): Payer: Medicare Other | Admitting: Family Medicine

## 2015-07-09 ENCOUNTER — Encounter: Payer: Self-pay | Admitting: Family Medicine

## 2015-07-09 VITALS — BP 113/66 | HR 62 | Temp 98.2°F | Resp 16 | Ht 68.0 in | Wt 143.0 lb

## 2015-07-09 DIAGNOSIS — G894 Chronic pain syndrome: Secondary | ICD-10-CM | POA: Diagnosis not present

## 2015-07-09 DIAGNOSIS — F17200 Nicotine dependence, unspecified, uncomplicated: Secondary | ICD-10-CM | POA: Diagnosis not present

## 2015-07-09 DIAGNOSIS — D571 Sickle-cell disease without crisis: Secondary | ICD-10-CM | POA: Diagnosis not present

## 2015-07-09 DIAGNOSIS — F1120 Opioid dependence, uncomplicated: Secondary | ICD-10-CM

## 2015-07-09 DIAGNOSIS — F1190 Opioid use, unspecified, uncomplicated: Secondary | ICD-10-CM

## 2015-07-09 LAB — POCT URINALYSIS DIP (DEVICE)
Bilirubin Urine: NEGATIVE
Glucose, UA: NEGATIVE mg/dL
HGB URINE DIPSTICK: NEGATIVE
Ketones, ur: NEGATIVE mg/dL
Leukocytes, UA: NEGATIVE
NITRITE: NEGATIVE
PH: 7 (ref 5.0–8.0)
Protein, ur: NEGATIVE mg/dL
Specific Gravity, Urine: 1.015 (ref 1.005–1.030)
UROBILINOGEN UA: 0.2 mg/dL (ref 0.0–1.0)

## 2015-07-09 MED ORDER — OXYCODONE HCL 15 MG PO TABS: 15.0000 mg | ORAL_TABLET | Freq: Four times a day (QID) | ORAL | Status: DC | PRN

## 2015-07-09 MED ORDER — VARENICLINE TARTRATE 0.5 MG X 11 & 1 MG X 42 PO MISC: ORAL | Status: DC

## 2015-07-09 MED FILL — METHADONE HCL 5 MG TABLET: 5 | 30 days supply | Qty: 30 | Fill #0

## 2015-07-09 MED FILL — CHANTIX STARTING MONTH BOX: 0.5 MG X 11 | 28 days supply | Qty: 53 | Fill #0

## 2015-07-09 NOTE — Patient Instructions (Addendum)
Reviewed EKG, consistent with baseline Follow-up in 1 monthSickle Cell Anemia, Adult Sickle cell anemia is a condition in which red blood cells have an abnormal "sickle" shape. This abnormal shape shortens the cells' life span, which results in a lower than normal concentration of red blood cells in the blood. The sickle shape also causes the cells to clump together and block free blood flow through the blood vessels. As a result, the tissues and organs of the body do not receive enough oxygen. Sickle cell anemia causes organ damage and pain and increases the risk of infection. CAUSES  Sickle cell anemia is a genetic disorder. Those who receive two copies of the gene have the condition, and those who receive one copy have the trait. RISK FACTORS The sickle cell gene is most common in people whose families originated in [place]. Other areas of the globe where sickle cell trait occurs include the [place], [place] and [place], and the [place].  SIGNS AND SYMPTOMS  Pain, especially in the extremities, back, chest, or abdomen (common). The pain may start suddenly or may develop following an illness, especially if there is dehydration. Pain can also occur due to overexertion or exposure to extreme temperature changes.  Frequent severe bacterial infections, especially certain types of pneumonia and meningitis.  Pain and swelling in the hands and feet.  Decreased activity.   Loss of appetite.   Change in behavior.  Headaches.  Seizures.  Shortness of breath or difficulty breathing.  Vision changes.  Skin ulcers. Those with the trait may not have symptoms or they may have mild symptoms.  DIAGNOSIS  Sickle cell anemia is diagnosed with blood tests that demonstrate the genetic trait. It is often diagnosed during the newborn period, due to mandatory testing nationwide. A variety of blood tests, X-rays, CT scans, MRI scans, ultrasounds, and lung function tests may  also be done to monitor the condition. TREATMENT  Sickle cell anemia may be treated with:  Medicines. You may be given pain medicines, antibiotic medicines (to treat and prevent infections) or medicines to increase the production of certain types of hemoglobin.  Fluids.  Oxygen.  Blood transfusions. HOME CARE INSTRUCTIONS   Drink enough fluid to keep your urine clear or pale yellow. Increase your fluid intake in hot weather and during exercise.  Do not smoke. Smoking lowers oxygen levels in the blood.   Only take over-the-counter or prescription medicines for pain, fever, or discomfort as directed by your health care provider.  Take antibiotics as directed by your health care provider. Make sure you finish them it even if you start to feel better.   Take supplements as directed by your health care provider.   Consider wearing a medical alert bracelet. This tells anyone caring for you in an emergency of your condition.   When traveling, keep your medical information, health care provider's names, and the medicines you take with you at all times.   If you develop a fever, do not take medicines to reduce the fever right away. This could cover up a problem that is developing. Notify your health care provider.  Keep all follow-up appointments with your health care provider. Sickle cell anemia requires regular medical care. SEEK MEDICAL CARE IF: You have a fever. SEEK IMMEDIATE MEDICAL CARE IF:   You feel dizzy or faint.   You have new abdominal pain, especially on the left side near the stomach area.   You develop a persistent, often uncomfortable and painful penile erection (  priapism). If this is not treated immediately it will lead to impotence.   You have numbness your arms or legs or you have a hard time moving them.   You have a hard time with speech.   You have a fever or persistent symptoms for more than 2-3 days.   You have a fever and your symptoms  suddenly get worse.   You have signs or symptoms of infection. These include:   Chills.   Abnormal tiredness (lethargy).   Irritability.   Poor eating.   Vomiting.   You develop pain that is not helped with medicine.   You develop shortness of breath.  You have pain in your chest.   You are coughing up pus-like or bloody sputum.   You develop a stiff neck.  Your feet or hands swell or have pain.  Your abdomen appears bloated.  You develop joint pain. MAKE SURE YOU:  Understand these instructions.   This information is not intended to replace advice given to you by your health care provider. Make sure you discuss any questions you have with your health care provider.   Document Released: 09/16/2005 Document Revised: 06/29/2014 Document Reviewed: 01/18/2013 Elsevier Interactive Patient Education [employer].

## 2015-07-09 NOTE — Progress Notes (Signed)
Subjective:    Patient ID: Alejandro Soto, male    DOB: 1985/10/27, 30 y.o.   MRN: [id number]  HPI Alejandro Soto, a 30 year old male with a history of sickle cell anemia, HbSS presents for follow up of sickle cell anemia and chronic pain. Alejandro Soto states that he currently has pain to his back, upper and lower extremities. Patient maintains that current pain intensity is 6/10 described as constant and aching. He last had Oxycodone 15 mg this am with moderate relief. Patient states that he is taking all medications consistently. Alejandro Soto denies headache, chest pains, shortness of breath, fatigue, nausea, vomiting or diarrhea.  Past Medical History  Diagnosis Date  . Sickle cell anemia (HCC)   . Pneumonia    Social History   Social History  . Marital Status: Single    Spouse Name: N/A  . Number of Children: N/A  . Years of Education: N/A   Occupational History  . Not on file.   Social History Main Topics  . Smoking status: Current Some Day Smoker -- 0.25 packs/day for 5 years    Types: Cigarettes  . Smokeless tobacco: Never Used  . Alcohol Use: No  . Drug Use: No  . Sexual Activity: Yes   Other Topics Concern  . Not on file   Social History Narrative   Immunization History  Administered Date(s) Administered  . Influenza Whole 03/21/2012  . Influenza,inj,Quad PF,36+ Mos 03/21/2013, 03/01/2014  . Pneumococcal Conjugate-13 03/21/2012  . Pneumococcal Polysaccharide-23 07/15/2014   Review of Systems  Constitutional: Negative.  Negative for fever and fatigue.  HENT: Negative.   Eyes: Negative.  Negative for visual disturbance.  Respiratory: Negative.   Cardiovascular: Negative.   Gastrointestinal: Positive for abdominal pain.  Endocrine: Negative.  Negative for polydipsia, polyphagia and polyuria.  Genitourinary: Negative.   Musculoskeletal: Positive for myalgias (low extremity pain) and back pain.  Skin: Negative.   Allergic/Immunologic: Negative.   Neurological:  Negative.   Hematological: Negative.   Psychiatric/Behavioral: Negative.  Negative for suicidal ideas.      Objective:   Physical Exam  Constitutional: He is oriented to person, place, and time. He appears well-developed and well-nourished.  HENT:  Head: Normocephalic and atraumatic.  Right Ear: External ear normal.  Left Ear: External ear normal.  Nose: Nose normal.  Mouth/Throat: Oropharynx is clear and moist.  Eyes: Conjunctivae and EOM are normal. Pupils are equal, round, and reactive to light.  Neck: Normal range of motion. Neck supple.  Cardiovascular: Normal rate, regular rhythm, normal heart sounds and intact distal pulses.   Pulmonary/Chest: Effort normal and breath sounds normal.  Abdominal: Soft. Bowel sounds are normal.  Musculoskeletal: Normal range of motion.  Neurological: He is alert and oriented to person, place, and time. He has normal reflexes.  Skin: Skin is warm and dry.  Psychiatric: He has a normal mood and affect. His behavior is normal. Judgment and thought content normal.     BP 113/66 mmHg  Pulse 62  Temp(Src) 98.2 F (36.8 C) (Oral)  Resp 16  Ht 5\' 8"  (1.727 m)  Wt 143 lb (64.864 kg)  BMI 21.75 kg/m2 Assessment & Plan:   1. Sickle cell disease, type SS Sickle cell disease - Continue Hydrea 1500 mg, 30 mg/kg/day. Discussed possibility of increasing.  We discussed the need for good hydration, monitoring of hydration status, avoidance of heat, cold, stress, and infection triggers. We discussed the risks and benefits of Hydrea, including bone marrow suppression, the possibility  of GI upset, skin ulcers, hair thinning, and teratogenicity. The patient was reminded of the need to seek medical attention of any symptoms of bleeding, anemia, or infection. Continue folic acid 1 mg daily to prevent aplastic bone marrow crises.   Pulmonary evaluation - Patient denies severe recurrent wheezes, shortness of breath with exercise, or persistent cough. If these  symptoms develop, pulmonary function tests with spirometry will be ordered, and if abnormal, plan on referral to Pulmonology for further evaluation.  Cardiac - Routine screening for pulmonary hypertension is not recommended.  Eye - High risk of proliferative retinopathy. Annual eye exam with retinal exam recommended to patient. Sent referral, patient has missed last several appointments.    Immunization status - Alejandro Soto is up to date with vaccinations. - POCT urinalysis dipstick - oxyCODONE (ROXICODONE) 15 MG immediate release tablet; Take 1 tablet (15 mg total) by mouth every 6 (six) hours as needed for pain.  Dispense: 90 tablet; Refill: 0  2. Chronic pain syndrome We agreed on weaning his current medication dose. We discussed that Alejandro Soto  Schedule II prescriptions only from [place]. Reviewed [place] Substance Reporting System, discussed Rx from for Oxycodone 15 mg every 6 hours prn # 90.Controlled substance agreement signed. We reminded Alejandro Soto that all patients receiving Schedule II narcotics must be seen for follow within one month of prescription being requested. We reviewed the terms of our pain agreement, including the need to keep medicines in a safe locked location away from children or pets, and the need to report excess sedation or constipation, measures to avoid constipation, and policies related to early refills and stolen prescriptions. According to the [place] [facility] program, we have reviewed details related to analgesia, adverse effects, aberrant behaviors. Patient was previously referred to pain management.  Reviewed [place] Substance Reporting system prior to prescribing opiate medications, no inconsistencies noted.   - oxyCODONE (ROXICODONE) 15 MG immediate release tablet; Take 1 tablet (15 mg total) by mouth every 6 (six) hours as needed for pain.  Dispense: 90 tablet; Refill: 0  3. Tobacco dependence Patient is in the contemplative state. He has failed nicotine patches and  nicoderm gum in the past. Will start Chantix on today.  - varenicline (CHANTIX STARTING MONTH PAK) 0.5 MG X 11 & 1 MG X 42 tablet; Take one 0.5 mg tablet by mouth once daily for 3 days, then increase to one 0.5 mg tablet twice daily for 4 days, then increase to one 1 mg tablet twice daily.  Dispense: 53 tablet; Refill: 0  4. Methadone use ([place]) Reviewed EKG, consistent with baseline. No changes noted.  - EKG 12-Lead   RTC: Patient will follow up in 1 month for medication management   Dorena Dew, FNP

## 2015-08-05 ENCOUNTER — Telehealth: Payer: Self-pay | Admitting: Internal Medicine

## 2015-08-05 DIAGNOSIS — D571 Sickle-cell disease without crisis: Secondary | ICD-10-CM

## 2015-08-05 DIAGNOSIS — G894 Chronic pain syndrome: Secondary | ICD-10-CM

## 2015-08-05 NOTE — Telephone Encounter (Signed)
Refill request for oxycodone 15mg . LOV 07/09/2015. Please advise. Thanks!

## 2015-08-06 MED ORDER — OXYCODONE HCL 15 MG PO TABS: 15.0000 mg | ORAL_TABLET | Freq: Four times a day (QID) | ORAL | Status: DC | PRN

## 2015-08-06 MED FILL — oxyCODONE HCL 15 MG TABS: 15 | 22 days supply | Qty: 90 | Fill #0

## 2015-08-06 NOTE — Telephone Encounter (Signed)
Reviewed [place] Substance Reporting system prior to prescribing opiate medications.  Meds ordered this encounter  Medications  . oxyCODONE (ROXICODONE) 15 MG immediate release tablet    Sig: Take 1 tablet (15 mg total) by mouth every 6 (six) hours as needed for pain.    Dispense:  90 tablet    Refill:  0    Order Specific Question:  Supervising Provider    Answer:  Tresa Garter [id number]    Dorena Dew, FNP

## 2015-08-12 ENCOUNTER — Encounter: Payer: Self-pay | Admitting: Family Medicine

## 2015-08-12 ENCOUNTER — Ambulatory Visit (INDEPENDENT_AMBULATORY_CARE_PROVIDER_SITE_OTHER): Payer: Medicare Other | Admitting: Family Medicine

## 2015-08-12 VITALS — BP 118/76 | HR 65 | Temp 98.4°F | Resp 16 | Ht 68.0 in | Wt 137.0 lb

## 2015-08-12 DIAGNOSIS — F1120 Opioid dependence, uncomplicated: Secondary | ICD-10-CM

## 2015-08-12 DIAGNOSIS — F17200 Nicotine dependence, unspecified, uncomplicated: Secondary | ICD-10-CM

## 2015-08-12 DIAGNOSIS — F1190 Opioid use, unspecified, uncomplicated: Secondary | ICD-10-CM

## 2015-08-12 DIAGNOSIS — D571 Sickle-cell disease without crisis: Secondary | ICD-10-CM

## 2015-08-12 IMAGING — CR DG LUMBAR SPINE COMPLETE 4+V
5 series · 5 of 5 positions shown · non-contrast
Comparison: Lumbar MRI 12/05/2012

CLINICAL DATA: Low back pain

EXAM:
LUMBAR SPINE - COMPLETE 4+ VIEW

[t l-spine a.p.]
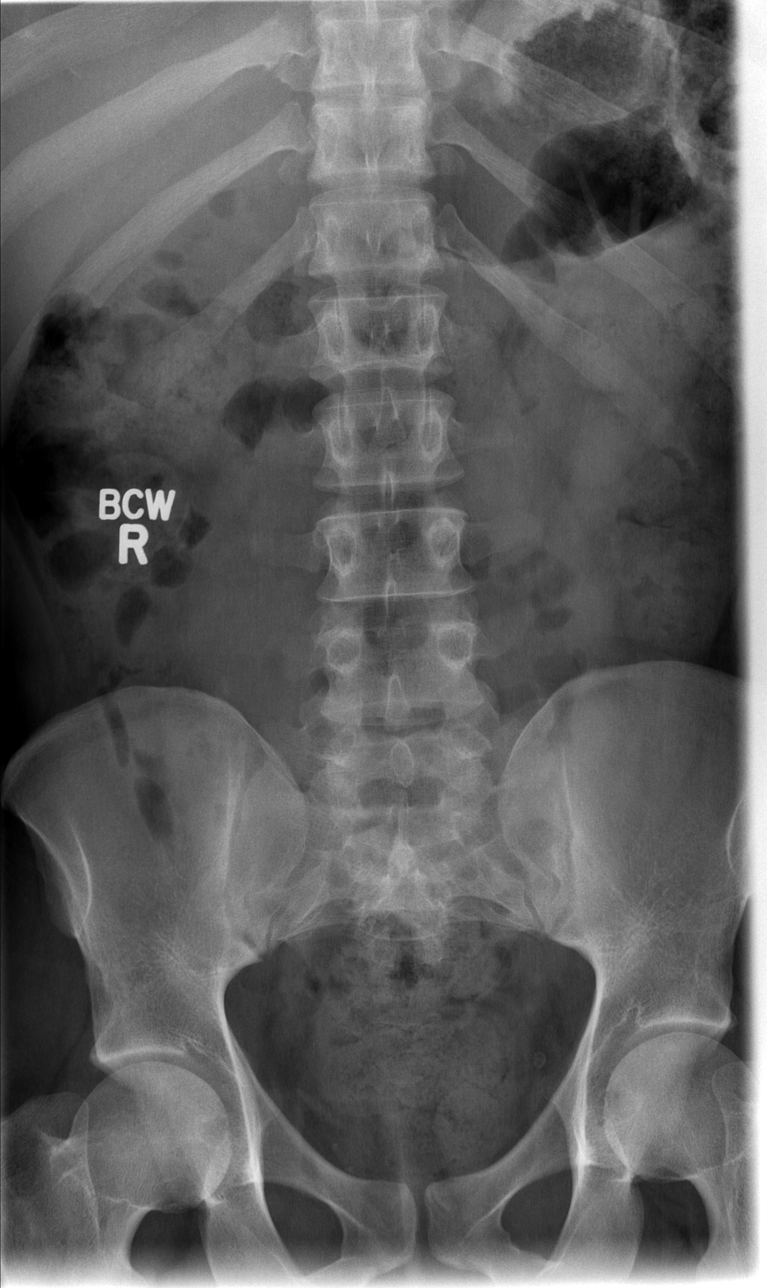

[t l-spine oblique exposure (1 of 2)]
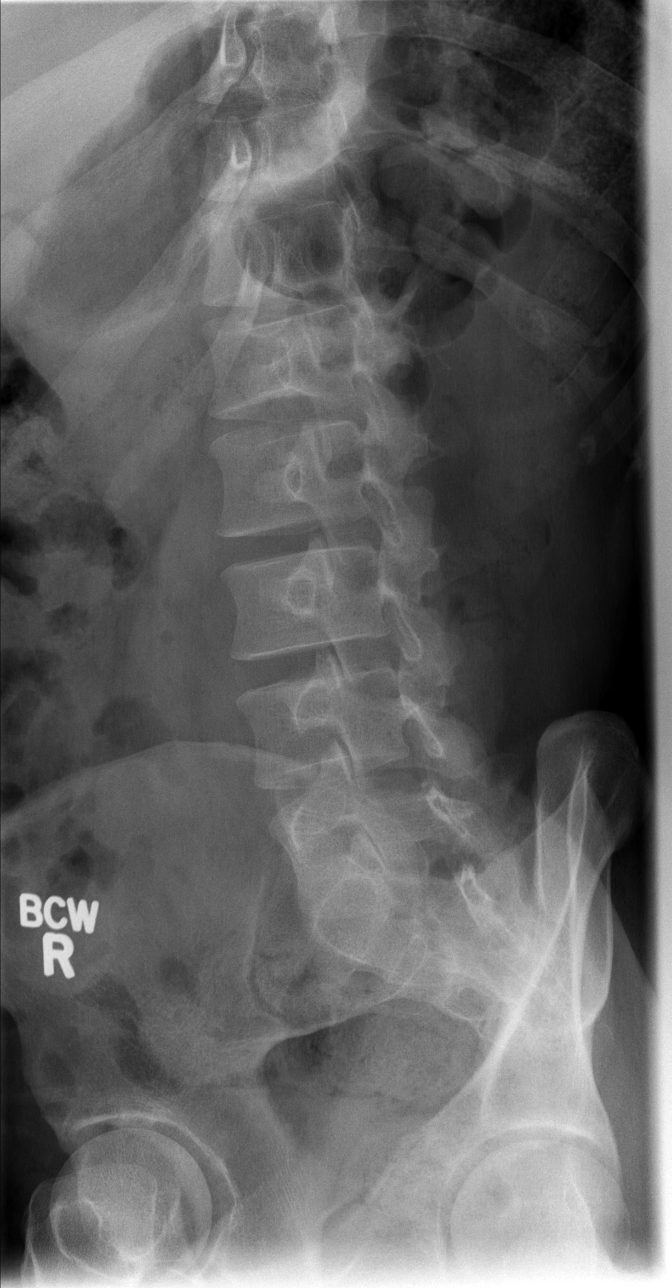

[t l-spine oblique exposure (2 of 2)]
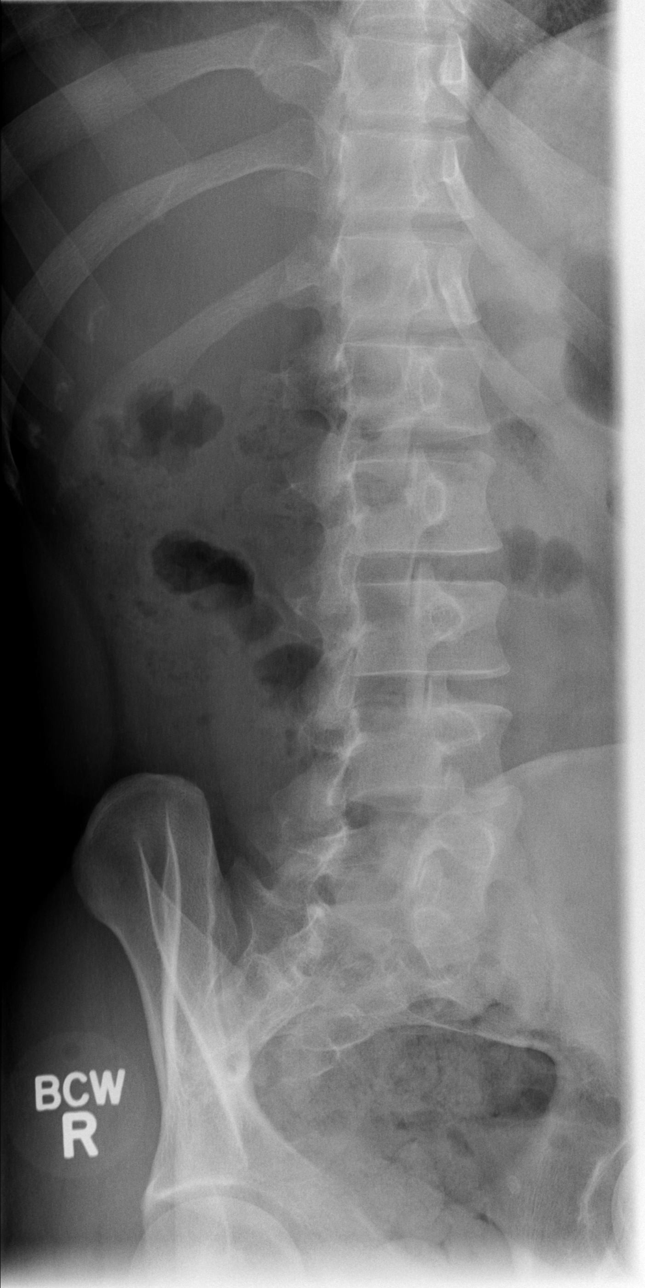

[t l-spine lat]
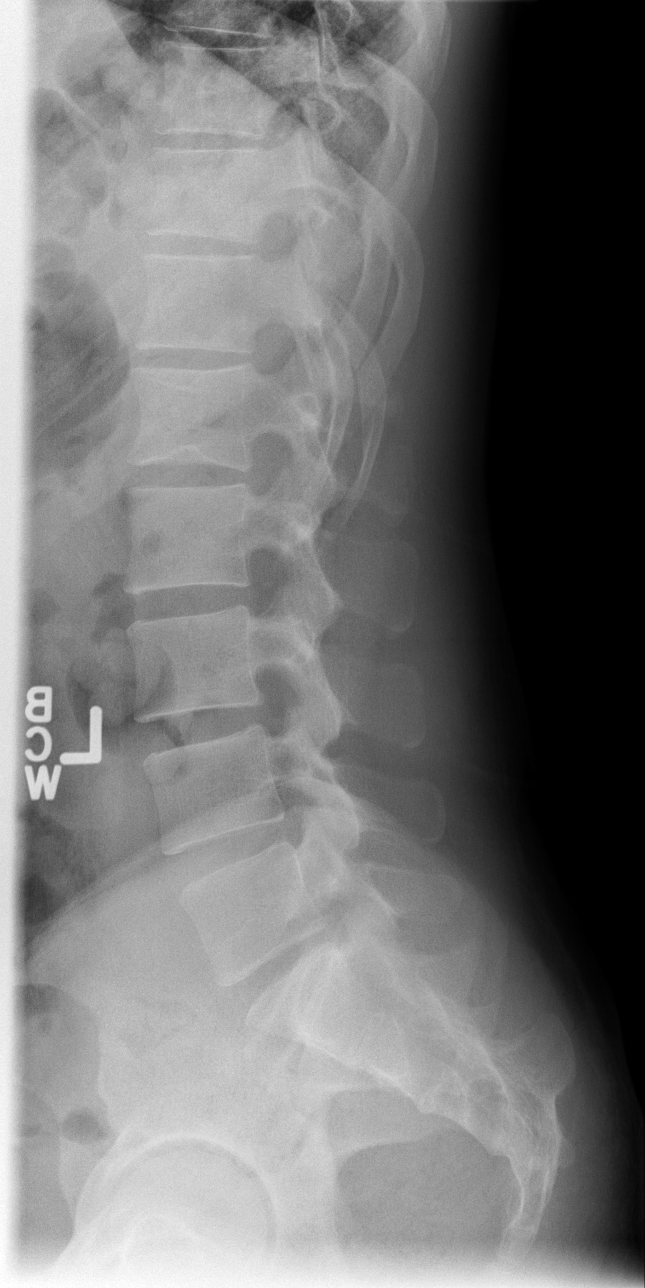

[t l-spine l5-s1 spot]
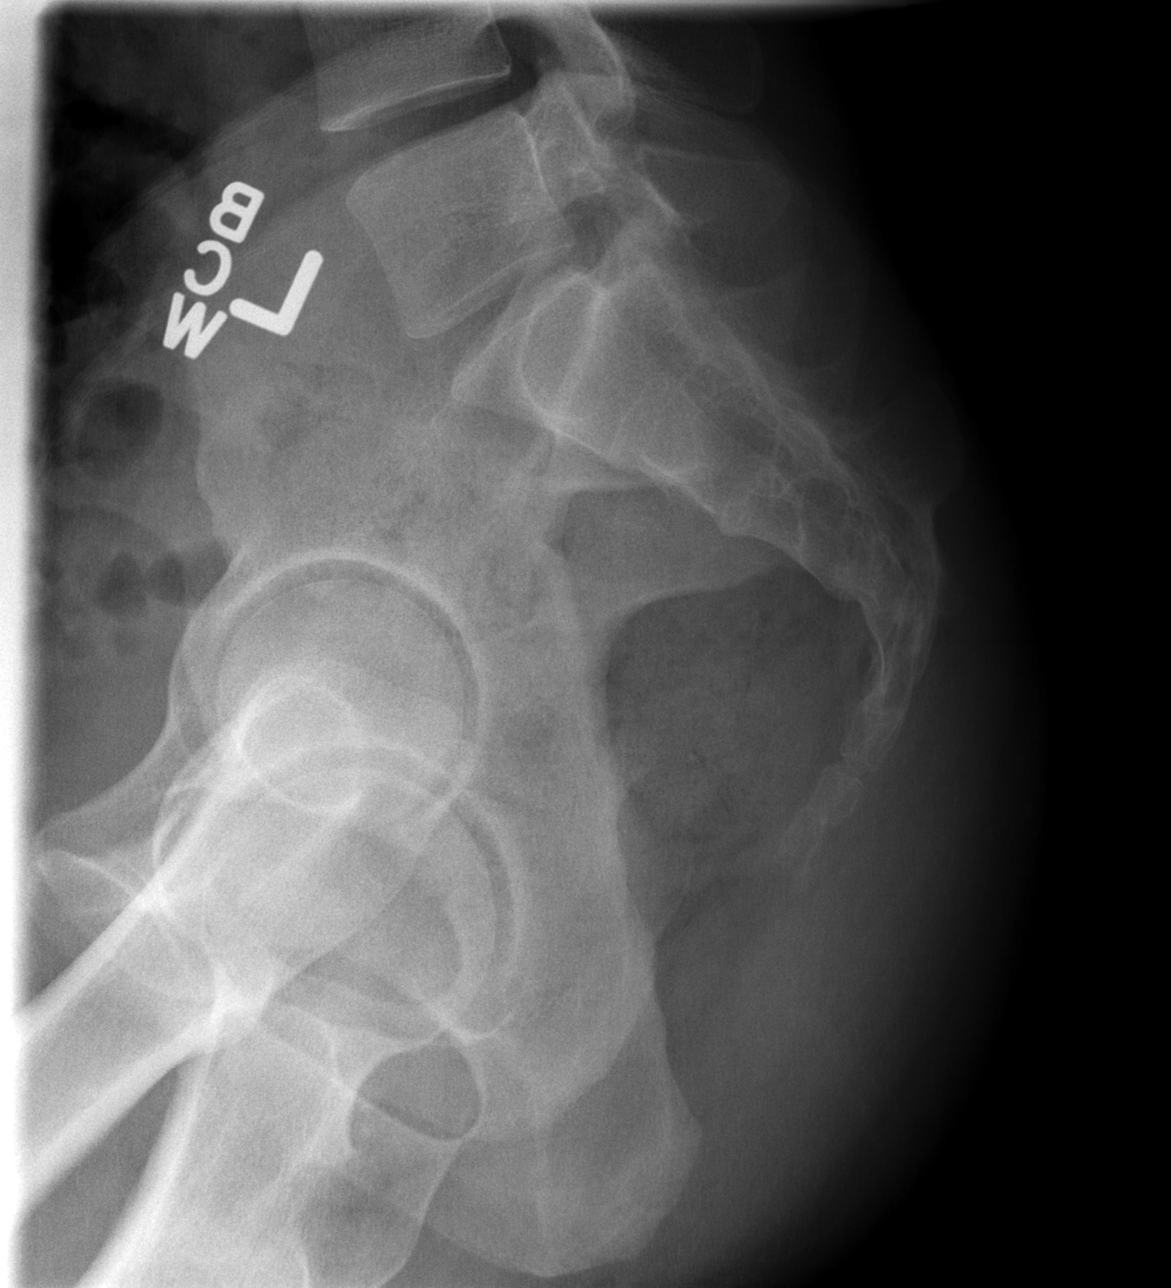

[5 of 5 positions shown; findings below may reference images not displayed]

FINDINGS: There is no evidence of lumbar spine fracture. Alignment is normal.
Intervertebral disc spaces are maintained. Schmorl's nodes involving
the superior and inferior endplates of L1, unchanged from the prior
MRI.
IMPRESSION: Negative.

## 2015-08-12 MED ORDER — METHADONE HCL 5 MG PO TABS: ORAL_TABLET | ORAL | Status: DC

## 2015-08-12 MED FILL — METHADONE HCL 5 MG TABLET: 5 | 30 days supply | Qty: 30 | Fill #0

## 2015-08-12 NOTE — Patient Instructions (Signed)
Sickle Cell Anemia, Adult Sickle cell anemia is a condition in which red blood cells have an abnormal "sickle" shape. This abnormal shape shortens the cells' life span, which results in a lower than normal concentration of red blood cells in the blood. The sickle shape also causes the cells to clump together and block free blood flow through the blood vessels. As a result, the tissues and organs of the body do not receive enough oxygen. Sickle cell anemia causes organ damage and pain and increases the risk of infection. CAUSES  Sickle cell anemia is a genetic disorder. Those who receive two copies of the gene have the condition, and those who receive one copy have the trait. RISK FACTORS The sickle cell gene is most common in people whose families originated in [place]. Other areas of the globe where sickle cell trait occurs include the Mediterranean, [place] and [place], the [place], and the [place].  SIGNS AND SYMPTOMS  Pain, especially in the extremities, back, chest, or abdomen (common). The pain may start suddenly or may develop following an illness, especially if there is dehydration. Pain can also occur due to overexertion or exposure to extreme temperature changes.  Frequent severe bacterial infections, especially certain types of pneumonia and meningitis.  Pain and swelling in the hands and feet.  Decreased activity.   Loss of appetite.   Change in behavior.  Headaches.  Seizures.  Shortness of breath or difficulty breathing.  Vision changes.  Skin ulcers. Those with the trait may not have symptoms or they may have mild symptoms.  DIAGNOSIS  Sickle cell anemia is diagnosed with blood tests that demonstrate the genetic trait. It is often diagnosed during the newborn period, due to mandatory testing nationwide. A variety of blood tests, X-rays, CT scans, MRI scans, ultrasounds, and lung function tests may also be done to monitor the condition. TREATMENT  Sickle  cell anemia may be treated with:  Medicines. You may be given pain medicines, antibiotic medicines (to treat and prevent infections) or medicines to increase the production of certain types of hemoglobin.  Fluids.  Oxygen.  Blood transfusions. HOME CARE INSTRUCTIONS   Drink enough fluid to keep your urine clear or pale yellow. Increase your fluid intake in hot weather and during exercise.  Do not smoke. Smoking lowers oxygen levels in the blood.   Only take over-the-counter or prescription medicines for pain, fever, or discomfort as directed by your health care provider.  Take antibiotics as directed by your health care provider. Make sure you finish them it even if you start to feel better.   Take supplements as directed by your health care provider.   Consider wearing a medical alert bracelet. This tells anyone caring for you in an emergency of your condition.   When traveling, keep your medical information, health care provider's names, and the medicines you take with you at all times.   If you develop a fever, do not take medicines to reduce the fever right away. This could cover up a problem that is developing. Notify your health care provider.  Keep all follow-up appointments with your health care provider. Sickle cell anemia requires regular medical care. SEEK MEDICAL CARE IF: You have a fever. SEEK IMMEDIATE MEDICAL CARE IF:   You feel dizzy or faint.   You have new abdominal pain, especially on the left side near the stomach area.   You develop a persistent, often uncomfortable and painful penile erection (priapism). If this is not treated immediately it   will lead to impotence.   You have numbness your arms or legs or you have a hard time moving them.   You have a hard time with speech.   You have a fever or persistent symptoms for more than 2-3 days.   You have a fever and your symptoms suddenly get worse.   You have signs or symptoms of infection.  These include:   Chills.   Abnormal tiredness (lethargy).   Irritability.   Poor eating.   Vomiting.   You develop pain that is not helped with medicine.   You develop shortness of breath.  You have pain in your chest.   You are coughing up pus-like or bloody sputum.   You develop a stiff neck.  Your feet or hands swell or have pain.  Your abdomen appears bloated.  You develop joint pain. MAKE SURE YOU:  Understand these instructions.   This information is not intended to replace advice given to you by your health care provider. Make sure you discuss any questions you have with your health care provider.   Document Released: 09/16/2005 Document Revised: 06/29/2014 Document Reviewed: 01/18/2013 Elsevier Interactive Patient Education 2016 [employer] Inc.  

## 2015-08-12 NOTE — Progress Notes (Signed)
Subjective:    Patient ID: Alejandro Soto, male    DOB: 02/22/1986, 30 y.o.   MRN: [id number]  HPI Mr. Login Brunken, a 30 year old male with a history of sickle cell anemia, HbSS presents for follow up of sickle cell anemia and chronic pain. Mr. Terrero states that he currently has pain to his back, upper and lower extremities. Patient maintains that current pain intensity is 5-6/10 described as constant and aching. He last had Oxycodone 15 mg on last night with moderate relief. Patient states that he is taking all medications consistently. Mr. Topper denies headache, chest pains, shortness of breath, fatigue, nausea, vomiting or diarrhea.  Past Medical History  Diagnosis Date  . Sickle cell anemia (HCC)   . Pneumonia    Social History   Social History  . Marital Status: Single    Spouse Name: N/A  . Number of Children: N/A  . Years of Education: N/A   Occupational History  . Not on file.   Social History Main Topics  . Smoking status: Current Some Day Smoker -- 0.25 packs/day for 5 years    Types: Cigarettes  . Smokeless tobacco: Never Used  . Alcohol Use: No  . Drug Use: No  . Sexual Activity: Yes   Other Topics Concern  . Not on file   Social History Narrative   Immunization History  Administered Date(s) Administered  . Influenza Whole 03/21/2012  . Influenza,inj,Quad PF,36+ Mos 03/21/2013, 03/01/2014  . Pneumococcal Conjugate-13 03/21/2012  . Pneumococcal Polysaccharide-23 07/15/2014   Review of Systems  Constitutional: Negative.  Negative for fever and fatigue.  HENT: Negative.   Eyes: Negative.  Negative for visual disturbance.  Respiratory: Negative.   Cardiovascular: Negative.   Endocrine: Negative.  Negative for polydipsia, polyphagia and polyuria.  Genitourinary: Negative.   Musculoskeletal: Positive for myalgias (low extremity pain) and back pain.  Skin: Negative.   Allergic/Immunologic: Negative.   Neurological: Negative.   Hematological: Negative.    Psychiatric/Behavioral: Negative.  Negative for suicidal ideas.      Objective:   Physical Exam  Constitutional: He is oriented to person, place, and time. He appears well-developed and well-nourished.  HENT:  Head: Normocephalic and atraumatic.  Right Ear: External ear normal.  Left Ear: External ear normal.  Nose: Nose normal.  Mouth/Throat: Oropharynx is clear and moist.  Eyes: Conjunctivae and EOM are normal. Pupils are equal, round, and reactive to light.  Neck: Normal range of motion. Neck supple.  Cardiovascular: Normal rate, regular rhythm, normal heart sounds and intact distal pulses.   Pulmonary/Chest: Effort normal and breath sounds normal.  Abdominal: Soft. Bowel sounds are normal.  Musculoskeletal: Normal range of motion.  Neurological: He is alert and oriented to person, place, and time. He has normal reflexes.  Skin: Skin is warm and dry.  Psychiatric: He has a normal mood and affect. His behavior is normal. Judgment and thought content normal.     BP 118/76 mmHg  Pulse 65  Temp(Src) 98.4 F (36.9 C) (Oral)  Resp 16  Ht 5\' 8"  (1.727 m)  Wt 137 lb (62.143 kg)  BMI 20.84 kg/m2 Assessment & Plan:   1. Sickle cell disease, type SS Sickle cell disease - Continue Hydrea 1500 mg, 30 mg/kg/day. Discussed possibility of increasing.  We discussed the need for good hydration, monitoring of hydration status, avoidance of heat, cold, stress, and infection triggers. We discussed the risks and benefits of Hydrea, including bone marrow suppression, the possibility of GI upset, skin ulcers,  hair thinning, and teratogenicity. The patient was reminded of the need to seek medical attention of any symptoms of bleeding, anemia, or infection. Continue folic acid 1 mg daily to prevent aplastic bone marrow crises. Pt states that he and fiance are considering having a child, discussed the importance of discontinuing use of Hydroxyurea prior to conceiving.   Pulmonary evaluation - Patient  denies severe recurrent wheezes, shortness of breath with exercise, or persistent cough. If these symptoms develop, pulmonary function tests with spirometry will be ordered, and if abnormal, plan on referral to Pulmonology for further evaluation.  Cardiac - Routine screening for pulmonary hypertension is not recommended.  Eye - High risk of proliferative retinopathy. Annual eye exam with retinal exam recommended to patient. Sent referral, patient has missed last several appointments.    Immunization status - Mr. Curro is up to date with vaccinations.  -2. Methadone use (HCC) - methadone (DOLOPHINE) 5 MG tablet; Take Methadone 5mg  at bedtime  Dispense: 30 tablet; Refill: 0  3. Tobacco dependence Smoking cessation instruction/counseling given:  counseled patient on the dangers of tobacco use, advised patient to stop smoking, and reviewed strategies to maximize success    RTC: Patient will follow up in 1 month for medication management   Dorena Dew, FNP

## 2015-08-23 ENCOUNTER — Telehealth: Payer: Self-pay | Admitting: *Deleted

## 2015-08-23 DIAGNOSIS — D571 Sickle-cell disease without crisis: Secondary | ICD-10-CM

## 2015-08-23 DIAGNOSIS — G894 Chronic pain syndrome: Secondary | ICD-10-CM

## 2015-08-23 NOTE — Telephone Encounter (Signed)
Refill request for oxycodone. LOV 08/12/2015. Please advise. Thanks!

## 2015-08-23 NOTE — Telephone Encounter (Signed)
Pt needs a refill of his oxycodone. Please advise provider. Thanks

## 2015-08-26 MED ORDER — OXYCODONE HCL 15 MG PO TABS: 15.0000 mg | ORAL_TABLET | Freq: Four times a day (QID) | ORAL | Status: DC | PRN

## 2015-08-26 NOTE — Telephone Encounter (Signed)
Reviewed [place] Substance Reporting system prior to prescribing opiate medications, no inconsistencies noted.  Meds ordered this encounter  Medications  . oxyCODONE (ROXICODONE) 15 MG immediate release tablet    Sig: Take 1 tablet (15 mg total) by mouth every 6 (six) hours as needed for pain.    Dispense:  90 tablet    Refill:  0    Order Specific Question:  Supervising Provider    Answer:  Tresa Garter [id number]     Dorena Dew, FNP

## 2015-08-27 MED FILL — oxyCODONE HCL 15 MG TABS: 15 | 23 days supply | Qty: 90 | Fill #0

## 2015-09-13 ENCOUNTER — Ambulatory Visit (INDEPENDENT_AMBULATORY_CARE_PROVIDER_SITE_OTHER): Payer: Medicare Other | Admitting: Family Medicine

## 2015-09-13 ENCOUNTER — Encounter: Payer: Self-pay | Admitting: Family Medicine

## 2015-09-13 VITALS — BP 115/67 | HR 76 | Temp 98.2°F | Ht 67.0 in | Wt 134.4 lb

## 2015-09-13 DIAGNOSIS — D571 Sickle-cell disease without crisis: Secondary | ICD-10-CM | POA: Diagnosis not present

## 2015-09-13 DIAGNOSIS — G894 Chronic pain syndrome: Secondary | ICD-10-CM

## 2015-09-13 DIAGNOSIS — F1120 Opioid dependence, uncomplicated: Secondary | ICD-10-CM | POA: Diagnosis not present

## 2015-09-13 DIAGNOSIS — F17200 Nicotine dependence, unspecified, uncomplicated: Secondary | ICD-10-CM

## 2015-09-13 DIAGNOSIS — F1190 Opioid use, unspecified, uncomplicated: Secondary | ICD-10-CM

## 2015-09-13 LAB — CBC WITH DIFFERENTIAL/PLATELET
BASOS PCT: 0 % (ref 0–1)
Basophils Absolute: 0 10*3/uL (ref 0.0–0.1)
EOS PCT: 1 % (ref 0–5)
Eosinophils Absolute: 0.1 10*3/uL (ref 0.0–0.7)
HEMATOCRIT: 29.2 % — AB (ref 39.0–52.0)
HEMOGLOBIN: 9.9 g/dL — AB (ref 13.0–17.0)
Lymphocytes Relative: 22 % (ref 12–46)
Lymphs Abs: 3.1 10*3/uL (ref 0.7–4.0)
MCH: 29.6 pg (ref 26.0–34.0)
MCHC: 33.9 g/dL (ref 30.0–36.0)
MCV: 87.4 fL (ref 78.0–100.0)
MONO ABS: 0.7 10*3/uL (ref 0.1–1.0)
MONOS PCT: 5 % (ref 3–12)
MPV: 8.8 fL (ref 8.6–12.4)
NEUTROS ABS: 10 10*3/uL — AB (ref 1.7–7.7)
Neutrophils Relative %: 72 % (ref 43–77)
Platelets: 563 10*3/uL — ABNORMAL HIGH (ref 150–400)
RBC: 3.34 MIL/uL — AB (ref 4.22–5.81)
RDW: 17.6 % — AB (ref 11.5–15.5)
WBC: 13.9 10*3/uL — AB (ref 4.0–10.5)

## 2015-09-13 LAB — POCT URINALYSIS DIP (DEVICE)
BILIRUBIN URINE: NEGATIVE
GLUCOSE, UA: NEGATIVE mg/dL
Hgb urine dipstick: NEGATIVE
KETONES UR: NEGATIVE mg/dL
LEUKOCYTES UA: NEGATIVE
Nitrite: NEGATIVE
Protein, ur: NEGATIVE mg/dL
Specific Gravity, Urine: 1.015 (ref 1.005–1.030)
Urobilinogen, UA: 0.2 mg/dL (ref 0.0–1.0)
pH: 5.5 (ref 5.0–8.0)

## 2015-09-13 LAB — RETICULOCYTES
ABS RETIC: 217.1 10*3/uL — AB (ref 19.0–186.0)
RBC.: 3.34 MIL/uL — ABNORMAL LOW (ref 4.22–5.81)
Retic Ct Pct: 6.5 % — ABNORMAL HIGH (ref 0.4–2.3)

## 2015-09-13 MED ORDER — OXYCODONE HCL 15 MG PO TABS: 15.0000 mg | ORAL_TABLET | Freq: Four times a day (QID) | ORAL | Status: DC | PRN

## 2015-09-13 MED ORDER — METHADONE HCL 5 MG PO TABS: ORAL_TABLET | ORAL | Status: DC

## 2015-09-13 MED FILL — HYDROXYUREA 500 MG CAPSULE: 500 | 30 days supply | Qty: 90 | Fill #2

## 2015-09-13 MED FILL — METHADONE HCL 5 MG TABLET: 5 | 30 days supply | Qty: 30 | Fill #0

## 2015-09-13 NOTE — Patient Instructions (Signed)
Sickle Cell Anemia, Adult Sickle cell anemia is a condition in which red blood cells have an abnormal "sickle" shape. This abnormal shape shortens the cells' life span, which results in a lower than normal concentration of red blood cells in the blood. The sickle shape also causes the cells to clump together and block free blood flow through the blood vessels. As a result, the tissues and organs of the body do not receive enough oxygen. Sickle cell anemia causes organ damage and pain and increases the risk of infection. CAUSES  Sickle cell anemia is a genetic disorder. Those who receive two copies of the gene have the condition, and those who receive one copy have the trait. RISK FACTORS The sickle cell gene is most common in people whose families originated in [place]. Other areas of the globe where sickle cell trait occurs include the Mediterranean, [place] and [place], the [place], and the [place].  SIGNS AND SYMPTOMS  Pain, especially in the extremities, back, chest, or abdomen (common). The pain may start suddenly or may develop following an illness, especially if there is dehydration. Pain can also occur due to overexertion or exposure to extreme temperature changes.  Frequent severe bacterial infections, especially certain types of pneumonia and meningitis.  Pain and swelling in the hands and feet.  Decreased activity.   Loss of appetite.   Change in behavior.  Headaches.  Seizures.  Shortness of breath or difficulty breathing.  Vision changes.  Skin ulcers. Those with the trait may not have symptoms or they may have mild symptoms.  DIAGNOSIS  Sickle cell anemia is diagnosed with blood tests that demonstrate the genetic trait. It is often diagnosed during the newborn period, due to mandatory testing nationwide. A variety of blood tests, X-rays, CT scans, MRI scans, ultrasounds, and lung function tests may also be done to monitor the condition. TREATMENT  Sickle  cell anemia may be treated with:  Medicines. You may be given pain medicines, antibiotic medicines (to treat and prevent infections) or medicines to increase the production of certain types of hemoglobin.  Fluids.  Oxygen.  Blood transfusions. HOME CARE INSTRUCTIONS   Drink enough fluid to keep your urine clear or pale yellow. Increase your fluid intake in hot weather and during exercise.  Do not smoke. Smoking lowers oxygen levels in the blood.   Only take over-the-counter or prescription medicines for pain, fever, or discomfort as directed by your health care provider.  Take antibiotics as directed by your health care provider. Make sure you finish them it even if you start to feel better.   Take supplements as directed by your health care provider.   Consider wearing a medical alert bracelet. This tells anyone caring for you in an emergency of your condition.   When traveling, keep your medical information, health care provider's names, and the medicines you take with you at all times.   If you develop a fever, do not take medicines to reduce the fever right away. This could cover up a problem that is developing. Notify your health care provider.  Keep all follow-up appointments with your health care provider. Sickle cell anemia requires regular medical care. SEEK MEDICAL CARE IF: You have a fever. SEEK IMMEDIATE MEDICAL CARE IF:   You feel dizzy or faint.   You have new abdominal pain, especially on the left side near the stomach area.   You develop a persistent, often uncomfortable and painful penile erection (priapism). If this is not treated immediately it   will lead to impotence.   You have numbness your arms or legs or you have a hard time moving them.   You have a hard time with speech.   You have a fever or persistent symptoms for more than 2-3 days.   You have a fever and your symptoms suddenly get worse.   You have signs or symptoms of infection.  These include:   Chills.   Abnormal tiredness (lethargy).   Irritability.   Poor eating.   Vomiting.   You develop pain that is not helped with medicine.   You develop shortness of breath.  You have pain in your chest.   You are coughing up pus-like or bloody sputum.   You develop a stiff neck.  Your feet or hands swell or have pain.  Your abdomen appears bloated.  You develop joint pain. MAKE SURE YOU:  Understand these instructions.   This information is not intended to replace advice given to you by your health care provider. Make sure you discuss any questions you have with your health care provider.   Document Released: 09/16/2005 Document Revised: 06/29/2014 Document Reviewed: 01/18/2013 Elsevier Interactive Patient Education 2016 [employer] Inc.  

## 2015-09-13 NOTE — Progress Notes (Signed)
Subjective:    Patient ID: Alejandro Soto, male    DOB: [birth year], 30 y.o.   MRN: [id number]  HPI [name], a 30 year old male with a history of sickle cell anemia, HbSS presents for follow up of sickle cell anemia and chronic pain. [name] states that he currently has pain to his back, upper and lower extremities. Patient maintains that current pain intensity is 7/10 described as constant and aching. He last had Oxycodone 15 mg on yesterday with moderate relief. Patient states that he is taking all other medications consistently. [name] denies headache, chest pains, shortness of breath, fatigue, nausea, vomiting or diarrhea.  Past Medical History  Diagnosis Date  . Sickle cell anemia (HCC)   . Pneumonia    Social History   Social History  . Marital Status: Single    Spouse Name: N/A  . Number of Children: N/A  . Years of Education: N/A   Occupational History  . Not on file.   Social History Main Topics  . Smoking status: Current Some Day Smoker -- 0.25 packs/day for 5 years    Types: Cigarettes  . Smokeless tobacco: Never Used  . Alcohol Use: No  . Drug Use: No  . Sexual Activity: Yes   Other Topics Concern  . Not on file   Social History Narrative   Immunization History  Administered Date(s) Administered  . Influenza Whole 03/21/2012  . Influenza,inj,Quad PF,36+ Mos 03/21/2013, 03/01/2014  . Pneumococcal Conjugate-13 03/21/2012  . Pneumococcal Polysaccharide-23 07/15/2014   Review of Systems  Constitutional: Negative.  Negative for fever and fatigue.  HENT: Negative.   Eyes: Negative.  Negative for visual disturbance.  Respiratory: Negative.   Cardiovascular: Negative.   Endocrine: Negative.  Negative for polydipsia, polyphagia and polyuria.  Genitourinary: Negative.   Musculoskeletal: Positive for myalgias (low extremity pain) and back pain.  Skin: Negative.   Allergic/Immunologic: Negative.   Neurological: Negative.   Hematological: Negative.    Psychiatric/Behavioral: Negative.  Negative for suicidal ideas.      Objective:   Physical Exam  Constitutional: He is oriented to person, place, and time. He appears well-developed and well-nourished.  HENT:  Head: Normocephalic and atraumatic.  Right Ear: External ear normal.  Left Ear: External ear normal.  Nose: Nose normal.  Mouth/Throat: Oropharynx is clear and moist.  Eyes: Conjunctivae and EOM are normal. Pupils are equal, round, and reactive to light.  Neck: Normal range of motion. Neck supple.  Cardiovascular: Normal rate, regular rhythm, normal heart sounds and intact distal pulses.   Pulmonary/Chest: Effort normal and breath sounds normal.  Abdominal: Soft. Bowel sounds are normal.  Musculoskeletal: Normal range of motion.  Neurological: He is alert and oriented to person, place, and time. He has normal reflexes.  Skin: Skin is warm and dry.  Psychiatric: He has a normal mood and affect. His behavior is normal. Judgment and thought content normal.     BP 115/67 mmHg  Pulse 76  Temp(Src) 98.2 F (36.8 C) (Oral)  Ht 5\' 7"  (1.702 m)  Wt 134 lb 6.4 oz (60.963 kg)  BMI 21.04 kg/m2  SpO2 99% Assessment & Plan:   1. Sickle cell disease, type SS Sickle cell disease - Continue Hydrea 1500 mg, 30 mg/kg/day. Discussed possibility of increasing.  We discussed the need for good hydration, monitoring of hydration status, avoidance of heat, cold, stress, and infection triggers. We discussed the risks and benefits of Hydrea, including bone marrow suppression, the possibility of GI upset,  skin ulcers, hair thinning, and teratogenicity. The patient was reminded of the need to seek medical attention of any symptoms of bleeding, anemia, or infection. Continue folic acid 1 mg daily to prevent aplastic bone marrow crises. Pt states that he and fiance are considering having a child, discussed the importance of discontinuing use of Hydroxyurea prior to conceiving.   Pulmonary evaluation -  Patient denies severe recurrent wheezes, shortness of breath with exercise, or persistent cough. If these symptoms develop, pulmonary function tests with spirometry will be ordered, and if abnormal, plan on referral to Pulmonology for further evaluation.  Cardiac - Routine screening for pulmonary hypertension is not recommended.  Eye - High risk of proliferative retinopathy. Annual eye exam with retinal exam recommended to patient. Sent referral, patient has missed last several appointments.    Immunization status - [name] is up to date with vaccinations. - oxyCODONE (ROXICODONE) 15 MG immediate release tablet; Take 1 tablet (15 mg total) by mouth every 6 (six) hours as needed for pain.  Dispense: 90 tablet; Refill: 0 - CBC with Differential - Reticulocytes - Prescription Monitoring Profile (17)-[facility] - POCT urinalysis dipstick - methadone (DOLOPHINE) 5 MG tablet; Take Methadone 5mg  at bedtime  Dispense: 30 tablet; Refill: 0  2. Chronic pain syndrome - oxyCODONE (ROXICODONE) 15 MG immediate release tablet; Take 1 tablet (15 mg total) by mouth every 6 (six) hours as needed for pain.  Dispense: 90 tablet; Refill: 0 - methadone (DOLOPHINE) 5 MG tablet; Take Methadone 5mg  at bedtime  Dispense: 30 tablet; Refill: 0  3. Methadone use (HCC) - methadone (DOLOPHINE) 5 MG tablet; Take Methadone 5mg  at bedtime  Dispense: 30 tablet; Refill: 0  4. Tobacco dependence  Smoking cessation instruction/counseling given:  counseled patient on the dangers of tobacco use, advised patient to stop smoking, and reviewed strategies to maximize success    RTC: Patient will follow up in 1 month for medication management   Dorena Dew, FNP

## 2015-09-18 LAB — OPIATES/OPIOIDS (LC/MS-MS)
Codeine Urine: 258 ng/mL — AB (ref <50)
HYDROCODONE: NEGATIVE ng/mL (ref <50)
HYDROMORPHONE: 109 ng/mL — AB (ref <50)
Morphine Urine: 18563 ng/mL — AB (ref <50)
NOROXYCODONE, UR: 141 ng/mL — AB (ref <50)
Norhydrocodone, Ur: NEGATIVE ng/mL (ref <50)
Oxycodone, ur: NEGATIVE ng/mL (ref <50)
Oxymorphone: 177 ng/mL — AB (ref <50)

## 2015-09-18 LAB — CANNABANOIDS (GC/LC/MS), URINE: THC-COOH UR CONFIRM: 147 ng/mL — AB (ref <5)

## 2015-09-18 LAB — OXYCODONE, URINE (LC/MS-MS)
Noroxycodone, Ur: 141 ng/mL — AB (ref <50)
OXYMORPHONE, URINE: 177 ng/mL — AB (ref <50)
Oxycodone, ur: NEGATIVE ng/mL (ref <50)

## 2015-09-18 LAB — FENTANYL (GC/LC/MS), URINE
FENTANYL (GC/MS) CONFIRM: 21.9 ng/mL — AB (ref <0.5)
Norfentanyl, confirm: 208.3 ng/mL — AB (ref <0.5)

## 2015-09-19 ENCOUNTER — Encounter: Payer: Self-pay | Admitting: Family Medicine

## 2015-09-19 LAB — PRESCRIPTION MONITORING PROFILE (SOLSTAS)
Amphetamine/Meth: NEGATIVE ng/mL
BARBITURATE SCREEN, URINE: NEGATIVE ng/mL
BENZODIAZEPINE SCREEN, URINE: NEGATIVE ng/mL
BUPRENORPHINE, URINE: NEGATIVE ng/mL
CARISOPRODOL, URINE: NEGATIVE ng/mL
COCAINE METABOLITES: NEGATIVE ng/mL
Creatinine, Urine: 114.93 mg/dL (ref >20.0)
ECSTASY: NEGATIVE ng/mL
Meperidine, Ur: NEGATIVE ng/mL
Methadone Screen, Urine: NEGATIVE ng/mL
Nitrites, Initial: NEGATIVE ug/mL
PROPOXYPHENE: NEGATIVE ng/mL
TAPENTADOLUR: NEGATIVE ng/mL
Tramadol Scrn, Ur: NEGATIVE ng/mL
ZOLPIDEM, URINE: NEGATIVE ng/mL
pH, Initial: 5 pH (ref 4.5–8.9)

## 2015-10-07 ENCOUNTER — Telehealth: Payer: Self-pay

## 2015-10-07 DIAGNOSIS — F1120 Opioid dependence, uncomplicated: Secondary | ICD-10-CM

## 2015-10-07 DIAGNOSIS — F1190 Opioid use, unspecified, uncomplicated: Secondary | ICD-10-CM

## 2015-10-07 DIAGNOSIS — D571 Sickle-cell disease without crisis: Secondary | ICD-10-CM

## 2015-10-07 DIAGNOSIS — G894 Chronic pain syndrome: Secondary | ICD-10-CM

## 2015-10-07 MED ORDER — OXYCODONE HCL 15 MG PO TABS: 15.0000 mg | ORAL_TABLET | Freq: Four times a day (QID) | ORAL | Status: DC | PRN

## 2015-10-07 MED ORDER — METHADONE HCL 5 MG PO TABS: ORAL_TABLET | ORAL | Status: DC

## 2015-10-07 NOTE — Telephone Encounter (Signed)
Reviewed  Substance Reporting system prior to prescribing opiate medications, no inconsistencies noted.   Meds ordered this encounter  Medications  . methadone (DOLOPHINE) 5 MG tablet    Sig: Take Methadone 5mg  at bedtime    Dispense:  30 tablet    Refill:  0    Rx not to be prescribed prior to 10/14/2015    Order Specific Question:  Supervising Provider    Answer:  Tresa Garter [id number]  . oxyCODONE (ROXICODONE) 15 MG immediate release tablet    Sig: Take 1 tablet (15 mg total) by mouth every 6 (six) hours as needed for pain.    Dispense:  90 tablet    Refill:  0    Order Specific Question:  Supervising Provider    Answer:  Tresa Garter [id number]    Dorena Dew, FNP

## 2015-10-07 NOTE — Telephone Encounter (Signed)
Pt is requesting medication for Oxycodone and Methodone. Thanks!

## 2015-10-18 ENCOUNTER — Ambulatory Visit (INDEPENDENT_AMBULATORY_CARE_PROVIDER_SITE_OTHER): Payer: Medicare Other | Admitting: Family Medicine

## 2015-10-18 ENCOUNTER — Encounter: Payer: Self-pay | Admitting: Family Medicine

## 2015-10-18 VITALS — BP 110/59 | HR 69 | Temp 98.3°F | Resp 14 | Ht 67.0 in | Wt 138.0 lb

## 2015-10-18 DIAGNOSIS — F17200 Nicotine dependence, unspecified, uncomplicated: Secondary | ICD-10-CM

## 2015-10-18 DIAGNOSIS — D571 Sickle-cell disease without crisis: Secondary | ICD-10-CM | POA: Diagnosis not present

## 2015-10-18 LAB — CBC WITH DIFFERENTIAL/PLATELET
BASOS ABS: 0 {cells}/uL (ref 0–200)
Basophils Relative: 0 %
EOS ABS: 324 {cells}/uL (ref 15–500)
Eosinophils Relative: 2 %
HCT: 26 % — ABNORMAL LOW (ref 38.5–50.0)
Hemoglobin: 8.7 g/dL — ABNORMAL LOW (ref 13.2–17.1)
Lymphocytes Relative: 29 %
Lymphs Abs: 4698 cells/uL — ABNORMAL HIGH (ref 850–3900)
MCH: 29.4 pg (ref 27.0–33.0)
MCHC: 33.5 g/dL (ref 32.0–36.0)
MCV: 87.8 fL (ref 80.0–100.0)
MONOS PCT: 9 %
MPV: 8.6 fL (ref 7.5–12.5)
Monocytes Absolute: 1458 cells/uL — ABNORMAL HIGH (ref 200–950)
Neutro Abs: 9720 cells/uL — ABNORMAL HIGH (ref 1500–7800)
Neutrophils Relative %: 60 %
PLATELETS: 434 10*3/uL — AB (ref 140–400)
RBC: 2.96 MIL/uL — ABNORMAL LOW (ref 4.20–5.80)
RDW: 18.5 % — AB (ref 11.0–15.0)
WBC: 16.2 10*3/uL — ABNORMAL HIGH (ref 3.8–10.8)

## 2015-10-18 LAB — POCT URINALYSIS DIP (DEVICE)
Bilirubin Urine: NEGATIVE
Glucose, UA: NEGATIVE mg/dL
Hgb urine dipstick: NEGATIVE
Ketones, ur: NEGATIVE mg/dL
Leukocytes, UA: NEGATIVE
Nitrite: NEGATIVE
PH: 7 (ref 5.0–8.0)
PROTEIN: NEGATIVE mg/dL
Specific Gravity, Urine: 1.015 (ref 1.005–1.030)
Urobilinogen, UA: 1 mg/dL (ref 0.0–1.0)

## 2015-10-18 NOTE — Progress Notes (Signed)
Subjective:    Patient ID: Alejandro Soto, male    DOB: November 24, 1985, 30 y.o.   MRN: [id number]  HPI Alejandro Soto, a 30 year old male with a history of sickle cell anemia, HbSS presents for follow up of sickle cell anemia and chronic pain. Alejandro Soto states that he currently has pain to his back, upper and lower extremities. Patient maintains that current pain intensity is 7/10 described as constant and aching. He last had Oxycodone 15 mg this am with moderate relief. Patient states that he is taking all other medications consistently. Alejandro Soto denies headache, chest pains, shortness of breath, fatigue, nausea, vomiting or diarrhea.  Past Medical History  Diagnosis Date  . Sickle cell anemia (HCC)   . Pneumonia    Social History   Social History  . Marital Status: Single    Spouse Name: N/A  . Number of Children: N/A  . Years of Education: N/A   Occupational History  . Not on file.   Social History Main Topics  . Smoking status: Current Some Day Smoker -- 0.25 packs/day for 5 years    Types: Cigarettes  . Smokeless tobacco: Never Used  . Alcohol Use: No  . Drug Use: No  . Sexual Activity: Yes   Other Topics Concern  . Not on file   Social History Narrative   Immunization History  Administered Date(s) Administered  . Influenza Whole 03/21/2012  . Influenza,inj,Quad PF,36+ Mos 03/21/2013, 03/01/2014  . Pneumococcal Conjugate-13 03/21/2012  . Pneumococcal Polysaccharide-23 07/15/2014   Review of Systems  Constitutional: Negative.  Negative for fever and fatigue.  HENT: Negative.   Eyes: Negative.  Negative for visual disturbance.  Respiratory: Negative.   Cardiovascular: Negative.   Endocrine: Negative.  Negative for polydipsia, polyphagia and polyuria.  Genitourinary: Negative.   Musculoskeletal: Positive for myalgias (low extremity pain) and back pain.  Skin: Negative.   Allergic/Immunologic: Negative.   Neurological: Negative.   Hematological: Negative.    Psychiatric/Behavioral: Negative.  Negative for suicidal ideas.      Objective:   Physical Exam  Constitutional: He is oriented to person, place, and time. He appears well-developed and well-nourished.  HENT:  Head: Normocephalic and atraumatic.  Right Ear: External ear normal.  Left Ear: External ear normal.  Nose: Nose normal.  Mouth/Throat: Oropharynx is clear and moist.  Eyes: Conjunctivae and EOM are normal. Pupils are equal, round, and reactive to light.  Neck: Normal range of motion. Neck supple.  Cardiovascular: Normal rate, regular rhythm, normal heart sounds and intact distal pulses.   Pulmonary/Chest: Effort normal and breath sounds normal.  Abdominal: Soft. Bowel sounds are normal.  Musculoskeletal: Normal range of motion.  Neurological: He is alert and oriented to person, place, and time. He has normal reflexes.  Skin: Skin is warm and dry.  Psychiatric: He has a normal mood and affect. His behavior is normal. Judgment and thought content normal.     BP 110/59 mmHg  Pulse 69  Temp(Src) 98.3 F (36.8 C) (Oral)  Resp 14  Ht 5\' 7"  (1.702 m)  Wt 138 lb (62.596 kg)  BMI 21.61 kg/m2  SpO2 98% Assessment & Plan:   1. Sickle cell disease, type SS Sickle cell disease - Continue Hydrea 1500 mg, 30 mg/kg/day.  Discussed possibility of increasing.  We discussed the need for good hydration, monitoring of hydration status, avoidance of heat, cold, stress, and infection triggers. We discussed the risks and benefits of Hydrea, including bone marrow suppression, the possibility of  GI upset, skin ulcers, hair thinning, and teratogenicity. The patient was reminded of the need to seek medical attention of any symptoms of bleeding, anemia, or infection. Continue folic acid 1 mg daily to prevent aplastic bone marrow crises. Pt states that he and fiance are considering having a child, discussed the importance of discontinuing use of Hydroxyurea prior to conceiving.   Platelet count  mildly elevated, will re-check platelet count, if it remains elevated will start a trial of Aspirin 81 mg.   Pulmonary evaluation - Patient denies severe recurrent wheezes, shortness of breath with exercise, or persistent cough. If these symptoms develop, pulmonary function tests with spirometry will be ordered, and if abnormal, plan on referral to Pulmonology for further evaluation.  Cardiac - Routine screening for pulmonary hypertension is not recommended.  Eye - High risk of proliferative retinopathy. Annual eye exam with retinal exam recommended to patient. Sent referral, patient has missed last several appointments.    Immunization status - Mr. Pribble is up to date with vaccinations. - POCT urinalysis dipstick - CBC with Differential  2. Tobacco dependence Smoking cessation instruction/counseling given:  counseled patient on the dangers of tobacco use, advised patient to stop smoking, and reviewed strategies to maximize success    RTC: Patient will follow up in 1 month for medication management   Dorena Dew, FNP

## 2015-10-18 NOTE — Patient Instructions (Signed)
Sickle Cell Anemia, Adult Sickle cell anemia is a condition in which red blood cells have an abnormal "sickle" shape. This abnormal shape shortens the cells' life span, which results in a lower than normal concentration of red blood cells in the blood. The sickle shape also causes the cells to clump together and block free blood flow through the blood vessels. As a result, the tissues and organs of the body do not receive enough oxygen. Sickle cell anemia causes organ damage and pain and increases the risk of infection. CAUSES  Sickle cell anemia is a genetic disorder. Those who receive two copies of the gene have the condition, and those who receive one copy have the trait. RISK FACTORS The sickle cell gene is most common in people whose families originated in [place]. Other areas of the globe where sickle cell trait occurs include the Mediterranean, [place] and [place], the [place], and the [place].  SIGNS AND SYMPTOMS  Pain, especially in the extremities, back, chest, or abdomen (common). The pain may start suddenly or may develop following an illness, especially if there is dehydration. Pain can also occur due to overexertion or exposure to extreme temperature changes.  Frequent severe bacterial infections, especially certain types of pneumonia and meningitis.  Pain and swelling in the hands and feet.  Decreased activity.   Loss of appetite.   Change in behavior.  Headaches.  Seizures.  Shortness of breath or difficulty breathing.  Vision changes.  Skin ulcers. Those with the trait may not have symptoms or they may have mild symptoms.  DIAGNOSIS  Sickle cell anemia is diagnosed with blood tests that demonstrate the genetic trait. It is often diagnosed during the newborn period, due to mandatory testing nationwide. A variety of blood tests, X-rays, CT scans, MRI scans, ultrasounds, and lung function tests may also be done to monitor the condition. TREATMENT  Sickle  cell anemia may be treated with:  Medicines. You may be given pain medicines, antibiotic medicines (to treat and prevent infections) or medicines to increase the production of certain types of hemoglobin.  Fluids.  Oxygen.  Blood transfusions. HOME CARE INSTRUCTIONS   Drink enough fluid to keep your urine clear or pale yellow. Increase your fluid intake in hot weather and during exercise.  Do not smoke. Smoking lowers oxygen levels in the blood.   Only take over-the-counter or prescription medicines for pain, fever, or discomfort as directed by your health care provider.  Take antibiotics as directed by your health care provider. Make sure you finish them it even if you start to feel better.   Take supplements as directed by your health care provider.   Consider wearing a medical alert bracelet. This tells anyone caring for you in an emergency of your condition.   When traveling, keep your medical information, health care provider's names, and the medicines you take with you at all times.   If you develop a fever, do not take medicines to reduce the fever right away. This could cover up a problem that is developing. Notify your health care provider.  Keep all follow-up appointments with your health care provider. Sickle cell anemia requires regular medical care. SEEK MEDICAL CARE IF: You have a fever. SEEK IMMEDIATE MEDICAL CARE IF:   You feel dizzy or faint.   You have new abdominal pain, especially on the left side near the stomach area.   You develop a persistent, often uncomfortable and painful penile erection (priapism). If this is not treated immediately it   will lead to impotence.   You have numbness your arms or legs or you have a hard time moving them.   You have a hard time with speech.   You have a fever or persistent symptoms for more than 2-3 days.   You have a fever and your symptoms suddenly get worse.   You have signs or symptoms of infection.  These include:   Chills.   Abnormal tiredness (lethargy).   Irritability.   Poor eating.   Vomiting.   You develop pain that is not helped with medicine.   You develop shortness of breath.  You have pain in your chest.   You are coughing up pus-like or bloody sputum.   You develop a stiff neck.  Your feet or hands swell or have pain.  Your abdomen appears bloated.  You develop joint pain. MAKE SURE YOU:  Understand these instructions.   This information is not intended to replace advice given to you by your health care provider. Make sure you discuss any questions you have with your health care provider.   Document Released: 09/16/2005 Document Revised: 06/29/2014 Document Reviewed: 01/18/2013 Elsevier Interactive Patient Education 2016 [employer] Inc.  

## 2015-10-25 ENCOUNTER — Telehealth: Payer: Self-pay

## 2015-10-25 DIAGNOSIS — D571 Sickle-cell disease without crisis: Secondary | ICD-10-CM

## 2015-10-25 DIAGNOSIS — G894 Chronic pain syndrome: Secondary | ICD-10-CM

## 2015-10-25 NOTE — Telephone Encounter (Signed)
Refill request for oxycodone 15mg . LOV 10/18/2015. Please advise. Thanks!

## 2015-10-25 NOTE — Telephone Encounter (Signed)
Pt is requesting a medication refill for oxycodone, 15mg . Thanks!

## 2015-10-28 MED ORDER — OXYCODONE HCL 15 MG PO TABS: 15.0000 mg | ORAL_TABLET | Freq: Four times a day (QID) | ORAL | Status: DC | PRN

## 2015-10-28 NOTE — Telephone Encounter (Signed)
Reviewed [place] Substance Reporting system prior to prescribing opiate medications, no inconsistencies noted.   Meds ordered this encounter  Medications  . oxyCODONE (ROXICODONE) 15 MG immediate release tablet    Sig: Take 1 tablet (15 mg total) by mouth every 6 (six) hours as needed for pain.    Dispense:  90 tablet    Refill:  0    Rx not to be filled prior to 10/30/2015    Order Specific Question:  Supervising Provider    Answer:  Tresa Garter [id number]     Dorena Dew, FNP

## 2015-11-13 ENCOUNTER — Telehealth: Payer: Self-pay

## 2015-11-13 DIAGNOSIS — G894 Chronic pain syndrome: Secondary | ICD-10-CM

## 2015-11-13 DIAGNOSIS — D571 Sickle-cell disease without crisis: Secondary | ICD-10-CM

## 2015-11-13 DIAGNOSIS — F1190 Opioid use, unspecified, uncomplicated: Secondary | ICD-10-CM

## 2015-11-13 DIAGNOSIS — F1120 Opioid dependence, uncomplicated: Secondary | ICD-10-CM

## 2015-11-13 NOTE — Telephone Encounter (Signed)
Refill request for oxycodone and methadone. LOV 10/18/2015. Please advise. Thanks!

## 2015-11-13 NOTE — Telephone Encounter (Signed)
Pt is requesting a medication refill for Oxycodone, 15mg  and Methodone, 5mg . Thanks!

## 2015-11-14 MED ORDER — OXYCODONE HCL 15 MG PO TABS: 15.0000 mg | ORAL_TABLET | Freq: Four times a day (QID) | ORAL | Status: DC | PRN

## 2015-11-14 MED ORDER — METHADONE HCL 5 MG PO TABS: ORAL_TABLET | ORAL | Status: DC

## 2015-11-14 NOTE — Telephone Encounter (Signed)
Reviewed [place] Substance Reporting system prior to prescribing opiate medications. No inconsistencies noted.  Meds ordered this encounter  Medications  . methadone (DOLOPHINE) 5 MG tablet    Sig: Take Methadone 5mg  at bedtime    Dispense:  30 tablet    Refill:  0    Order Specific Question:  Supervising Provider    Answer:  Tresa Garter [id number]  . oxyCODONE (ROXICODONE) 15 MG immediate release tablet    Sig: Take 1 tablet (15 mg total) by mouth every 6 (six) hours as needed for pain.    Dispense:  90 tablet    Refill:  0    Rx not to be filled prior to 11/21/2015    Order Specific Question:  Supervising Provider    Answer:  Tresa Garter [id number]    Dorena Dew, FNP

## 2015-11-19 MED FILL — METHADONE HCL 5 MG TABLET: 5 | 30 days supply | Qty: 30 | Fill #0

## 2015-12-04 ENCOUNTER — Ambulatory Visit (INDEPENDENT_AMBULATORY_CARE_PROVIDER_SITE_OTHER): Payer: Medicare Other | Admitting: Family Medicine

## 2015-12-04 ENCOUNTER — Encounter: Payer: Self-pay | Admitting: Family Medicine

## 2015-12-04 VITALS — BP 126/66 | HR 86 | Temp 98.1°F | Resp 14 | Ht 67.0 in | Wt 135.0 lb

## 2015-12-04 DIAGNOSIS — F1190 Opioid use, unspecified, uncomplicated: Secondary | ICD-10-CM

## 2015-12-04 DIAGNOSIS — F17200 Nicotine dependence, unspecified, uncomplicated: Secondary | ICD-10-CM | POA: Diagnosis not present

## 2015-12-04 DIAGNOSIS — D571 Sickle-cell disease without crisis: Secondary | ICD-10-CM

## 2015-12-04 DIAGNOSIS — G894 Chronic pain syndrome: Secondary | ICD-10-CM

## 2015-12-04 DIAGNOSIS — F1120 Opioid dependence, uncomplicated: Secondary | ICD-10-CM | POA: Diagnosis not present

## 2015-12-04 MED ORDER — METHADONE HCL 5 MG PO TABS: ORAL_TABLET | ORAL | Status: DC

## 2015-12-04 MED ORDER — OXYCODONE HCL 15 MG PO TABS: 15.0000 mg | ORAL_TABLET | Freq: Four times a day (QID) | ORAL | Status: DC | PRN

## 2015-12-04 MED FILL — HYDROXYUREA 500 MG CAPSULE: 500 | 30 days supply | Qty: 90 | Fill #3

## 2015-12-04 NOTE — Progress Notes (Signed)
Subjective:    Patient ID: Alejandro Soto, male    DOB: 27-Jun-1985, 30 y.o.   MRN: [id number]  HPI Mr. Lawerence Pruden, a 30 year old male with a history of sickle cell anemia, HbSS presents for follow up of sickle cell anemia and chronic pain. Mr. Cottrill states that he currently has pain to his back, upper and lower extremities. Patient maintains that current pain intensity is 7/10 described as constant and aching. He last had Oxycodone 15 mg this am with moderate relief. He says that he takes Methadone at night.  Patient states that he is taking all other medications consistently. Mr. Laprise denies headache, chest pains, shortness of breath, fatigue, nausea, vomiting or diarrhea.  Past Medical History  Diagnosis Date  . Sickle cell anemia (HCC)   . Pneumonia    Social History   Social History  . Marital Status: Single    Spouse Name: N/A  . Number of Children: N/A  . Years of Education: N/A   Occupational History  . Not on file.   Social History Main Topics  . Smoking status: Current Some Day Smoker -- 0.25 packs/day for 5 years    Types: Cigarettes  . Smokeless tobacco: Never Used  . Alcohol Use: No  . Drug Use: No  . Sexual Activity: Yes   Other Topics Concern  . Not on file   Social History Narrative   Immunization History  Administered Date(s) Administered  . Influenza Whole 03/21/2012  . Influenza,inj,Quad PF,36+ Mos 03/21/2013, 03/01/2014  . Pneumococcal Conjugate-13 03/21/2012  . Pneumococcal Polysaccharide-23 07/15/2014   Review of Systems  Constitutional: Negative.  Negative for fever and fatigue.  HENT: Negative.   Eyes: Negative.  Negative for visual disturbance.  Respiratory: Negative.   Cardiovascular: Negative.   Endocrine: Negative.  Negative for polydipsia, polyphagia and polyuria.  Genitourinary: Negative.   Musculoskeletal: Positive for myalgias (low extremity pain) and back pain.  Skin: Negative.   Allergic/Immunologic: Negative.   Neurological:  Negative.   Hematological: Negative.   Psychiatric/Behavioral: Negative.  Negative for suicidal ideas.      Objective:   Physical Exam  Constitutional: He is oriented to person, place, and time. He appears well-developed and well-nourished.  HENT:  Head: Normocephalic and atraumatic.  Right Ear: External ear normal.  Left Ear: External ear normal.  Nose: Nose normal.  Mouth/Throat: Oropharynx is clear and moist.  Eyes: Conjunctivae and EOM are normal. Pupils are equal, round, and reactive to light.  Neck: Normal range of motion. Neck supple.  Cardiovascular: Normal rate, regular rhythm, normal heart sounds and intact distal pulses.   Pulmonary/Chest: Effort normal and breath sounds normal.  Abdominal: Soft. Bowel sounds are normal.  Musculoskeletal: Normal range of motion.  Neurological: He is alert and oriented to person, place, and time. He has normal reflexes.  Skin: Skin is warm and dry.  Psychiatric: He has a normal mood and affect. His behavior is normal. Judgment and thought content normal.     BP 126/66 mmHg  Pulse 86  Temp(Src) 98.1 F (36.7 C) (Oral)  Resp 14  Ht 5\' 7"  (1.702 m)  Wt 135 lb (61.236 kg)  BMI 21.14 kg/m2  SpO2 100% Assessment & Plan:   1. Sickle cell disease, type SS Sickle cell disease - Continue Hydrea 1500 mg, 30 mg/kg/day.  Discussed possibility of increasing.  We discussed the need for good hydration, monitoring of hydration status, avoidance of heat, cold, stress, and infection triggers. We discussed the risks and benefits  of Hydrea, including bone marrow suppression, the possibility of GI upset, skin ulcers, hair thinning, and teratogenicity. The patient was reminded of the need to seek medical attention of any symptoms of bleeding, anemia, or infection. Continue folic acid 1 mg daily to prevent aplastic bone marrow crises. Pt states that he and fiance are considering having a child, discussed the importance of discontinuing use of Hydroxyurea prior  to conceiving.   Platelet count mildly elevated, will re-check platelet count, if it remains elevated will start a trial of Aspirin 81 mg.   Pulmonary evaluation - Patient denies severe recurrent wheezes, shortness of breath with exercise, or persistent cough. If these symptoms develop, pulmonary function tests with spirometry will be ordered, and if abnormal, plan on referral to Pulmonology for further evaluation.  Cardiac - Routine screening for pulmonary hypertension is not recommended.  Eye - High risk of proliferative retinopathy. Annual eye exam with retinal exam recommended to patient. Sent referral, patient has missed last several appointments.    Immunization status - Mr. Wates is up to date with vaccinations. - CBC with Differential - Reticulocytes - oxyCODONE (ROXICODONE) 15 MG immediate release tablet; Take 1 tablet (15 mg total) by mouth every 6 (six) hours as needed for pain.  Dispense: 90 tablet; Refill: 0 - methadone (DOLOPHINE) 5 MG tablet; Take Methadone 5mg  at bedtime  Dispense: 30 tablet; Refill: 0  2. Methadone use (HCC)  - EKG 12-Lead - methadone (DOLOPHINE) 5 MG tablet; Take Methadone 5mg  at bedtime  Dispense: 30 tablet; Refill: 0  3. Chronic pain syndrome  - oxyCODONE (ROXICODONE) 15 MG immediate release tablet; Take 1 tablet (15 mg total) by mouth every 6 (six) hours as needed for pain.  Dispense: 90 tablet; Refill: 0 - methadone (DOLOPHINE) 5 MG tablet; Take Methadone 5mg  at bedtime  Dispense: 30 tablet; Refill: 0  4 Tobacco dependence Smoking cessation instruction/counseling given:  counseled patient on the dangers of tobacco use, advised patient to stop smoking, and reviewed strategies to maximize success    RTC: Patient will follow up in 1 month for medication management   The patient was given clear instructions to go to ER or return to medical center if symptoms do not improve, worsen or new problems develop. The patient verbalized understanding. Will  notify patient with laboratory results.  Marland Kitchenop Dorena Dew, FNP

## 2015-12-05 LAB — CBC WITH DIFFERENTIAL/PLATELET
BASOS ABS: 0 {cells}/uL (ref 0–200)
BASOS PCT: 0 %
EOS ABS: 564 {cells}/uL — AB (ref 15–500)
Eosinophils Relative: 3 %
HEMATOCRIT: 31.4 % — AB (ref 38.5–50.0)
Hemoglobin: 10.2 g/dL — ABNORMAL LOW (ref 13.2–17.1)
LYMPHS PCT: 21 %
Lymphs Abs: 3948 cells/uL — ABNORMAL HIGH (ref 850–3900)
MCH: 30.1 pg (ref 27.0–33.0)
MCHC: 32.5 g/dL (ref 32.0–36.0)
MCV: 92.6 fL (ref 80.0–100.0)
MONO ABS: 1504 {cells}/uL — AB (ref 200–950)
MPV: 9 fL (ref 7.5–12.5)
Monocytes Relative: 8 %
NEUTROS PCT: 68 %
Neutro Abs: 12784 cells/uL — ABNORMAL HIGH (ref 1500–7800)
Platelets: 505 10*3/uL — ABNORMAL HIGH (ref 140–400)
RBC: 3.39 MIL/uL — ABNORMAL LOW (ref 4.20–5.80)
RDW: 17.2 % — AB (ref 11.0–15.0)
WBC: 18.8 10*3/uL — ABNORMAL HIGH (ref 3.8–10.8)

## 2015-12-05 LAB — RETICULOCYTES
ABS RETIC: 308490 {cells}/uL — AB (ref 25000–90000)
RBC.: 3.39 MIL/uL — ABNORMAL LOW (ref 4.20–5.80)
Retic Ct Pct: 9.1 %

## 2016-01-02 ENCOUNTER — Other Ambulatory Visit: Payer: Self-pay | Admitting: Family Medicine

## 2016-01-02 ENCOUNTER — Telehealth: Payer: Self-pay | Admitting: *Deleted

## 2016-01-02 DIAGNOSIS — D571 Sickle-cell disease without crisis: Secondary | ICD-10-CM

## 2016-01-02 DIAGNOSIS — F1190 Opioid use, unspecified, uncomplicated: Secondary | ICD-10-CM

## 2016-01-02 DIAGNOSIS — F1120 Opioid dependence, uncomplicated: Secondary | ICD-10-CM

## 2016-01-02 DIAGNOSIS — G894 Chronic pain syndrome: Secondary | ICD-10-CM

## 2016-01-02 MED ORDER — METHADONE HCL 5 MG PO TABS: ORAL_TABLET | ORAL | Status: DC

## 2016-01-02 MED ORDER — OXYCODONE HCL 15 MG PO TABS: 15.0000 mg | ORAL_TABLET | Freq: Four times a day (QID) | ORAL | Status: DC | PRN

## 2016-01-02 NOTE — Telephone Encounter (Signed)
Patient called and states that he  needs a refill of his oxycodone. Please advise provider. Thanks  

## 2016-01-02 NOTE — Telephone Encounter (Signed)
Refill request for oxycodone. LOV 12/04/2015 with [place]. Please advise. Thanks!

## 2016-01-07 MED FILL — oxyCODONE HCL 15 MG TABS: 15 | 22 days supply | Qty: 90 | Fill #0

## 2016-01-14 ENCOUNTER — Ambulatory Visit: Payer: Medicare Other | Admitting: Family Medicine

## 2016-01-14 ENCOUNTER — Other Ambulatory Visit: Payer: Self-pay | Admitting: *Deleted

## 2016-01-14 NOTE — Telephone Encounter (Signed)
Patient is wondering if he will be able to obtain a refill for august since he missed his appointment today and the patients reschedule is not until September.  Patient advised to call next week for request to be approved or denied for Friday prescription pick-up.  Patient expressed his understanding and had no further questions at this time.

## 2016-01-23 ENCOUNTER — Telehealth: Payer: Self-pay

## 2016-01-23 NOTE — Telephone Encounter (Signed)
Refill request for oxycodone 15mg . LOV 12/04/2015 with [place]. Please advise. Thanks!

## 2016-01-24 ENCOUNTER — Other Ambulatory Visit: Payer: Self-pay | Admitting: Internal Medicine

## 2016-01-24 DIAGNOSIS — G894 Chronic pain syndrome: Secondary | ICD-10-CM

## 2016-01-24 DIAGNOSIS — D571 Sickle-cell disease without crisis: Secondary | ICD-10-CM

## 2016-01-24 MED ORDER — OXYCODONE HCL 15 MG PO TABS: 15.0000 mg | ORAL_TABLET | Freq: Four times a day (QID) | ORAL | 0 refills | Status: DC | PRN

## 2016-02-09 DIAGNOSIS — D5700 Hb-SS disease with crisis, unspecified: Secondary | ICD-10-CM | POA: Diagnosis not present

## 2016-02-09 DIAGNOSIS — Z79899 Other long term (current) drug therapy: Secondary | ICD-10-CM | POA: Diagnosis not present

## 2016-02-09 DIAGNOSIS — Z880 Allergy status to penicillin: Secondary | ICD-10-CM | POA: Diagnosis not present

## 2016-02-12 ENCOUNTER — Telehealth: Payer: Self-pay

## 2016-02-13 NOTE — Telephone Encounter (Signed)
Refill request for Methadone and Oxycodone. LOV 12/04/2015. Please advise. Thanks!

## 2016-02-14 ENCOUNTER — Other Ambulatory Visit: Payer: Self-pay | Admitting: Family Medicine

## 2016-02-14 DIAGNOSIS — G894 Chronic pain syndrome: Secondary | ICD-10-CM

## 2016-02-14 DIAGNOSIS — D571 Sickle-cell disease without crisis: Secondary | ICD-10-CM

## 2016-02-14 DIAGNOSIS — F1120 Opioid dependence, uncomplicated: Secondary | ICD-10-CM

## 2016-02-14 DIAGNOSIS — F1190 Opioid use, unspecified, uncomplicated: Secondary | ICD-10-CM

## 2016-02-14 MED ORDER — METHADONE HCL 5 MG PO TABS: ORAL_TABLET | ORAL | 0 refills | Status: DC

## 2016-02-14 MED FILL — METHADONE HCL 5 MG TABLET: 5 | 30 days supply | Qty: 30 | Fill #0

## 2016-02-18 ENCOUNTER — Telehealth: Payer: Self-pay

## 2016-02-18 ENCOUNTER — Other Ambulatory Visit: Payer: Self-pay | Admitting: Internal Medicine

## 2016-02-18 DIAGNOSIS — D571 Sickle-cell disease without crisis: Secondary | ICD-10-CM

## 2016-02-18 DIAGNOSIS — G894 Chronic pain syndrome: Secondary | ICD-10-CM

## 2016-02-18 MED ORDER — OXYCODONE HCL 15 MG PO TABS: 15.0000 mg | ORAL_TABLET | Freq: Four times a day (QID) | ORAL | 0 refills | Status: DC | PRN

## 2016-02-18 MED FILL — oxyCODONE HCL 15 MG TABS: 15 | 23 days supply | Qty: 90 | Fill #0

## 2016-02-25 ENCOUNTER — Encounter: Payer: Self-pay | Admitting: Internal Medicine

## 2016-02-25 ENCOUNTER — Ambulatory Visit (INDEPENDENT_AMBULATORY_CARE_PROVIDER_SITE_OTHER): Payer: Medicare Other | Admitting: Internal Medicine

## 2016-02-25 VITALS — BP 98/59 | HR 74 | Temp 98.2°F | Resp 18 | Ht 68.0 in | Wt 136.0 lb

## 2016-02-25 DIAGNOSIS — D571 Sickle-cell disease without crisis: Secondary | ICD-10-CM | POA: Diagnosis not present

## 2016-02-25 DIAGNOSIS — F1120 Opioid dependence, uncomplicated: Secondary | ICD-10-CM

## 2016-02-25 DIAGNOSIS — G894 Chronic pain syndrome: Secondary | ICD-10-CM | POA: Diagnosis not present

## 2016-02-25 DIAGNOSIS — F17200 Nicotine dependence, unspecified, uncomplicated: Secondary | ICD-10-CM

## 2016-02-25 DIAGNOSIS — F1190 Opioid use, unspecified, uncomplicated: Secondary | ICD-10-CM

## 2016-02-25 LAB — CBC WITH DIFFERENTIAL/PLATELET
BASOS PCT: 0 %
Basophils Absolute: 0 cells/uL (ref 0–200)
EOS PCT: 2 %
Eosinophils Absolute: 270 cells/uL (ref 15–500)
HCT: 32.5 % — ABNORMAL LOW (ref 38.5–50.0)
Hemoglobin: 11 g/dL — ABNORMAL LOW (ref 13.2–17.1)
LYMPHS ABS: 2970 {cells}/uL (ref 850–3900)
Lymphocytes Relative: 22 %
MCH: 29.5 pg (ref 27.0–33.0)
MCHC: 33.8 g/dL (ref 32.0–36.0)
MCV: 87.1 fL (ref 80.0–100.0)
MONOS PCT: 8 %
MPV: 8.6 fL (ref 7.5–12.5)
Monocytes Absolute: 1080 cells/uL — ABNORMAL HIGH (ref 200–950)
NEUTROS ABS: 9180 {cells}/uL (ref 1500–7800)
Neutrophils Relative %: 68 %
PLATELETS: 558 10*3/uL — AB (ref 140–400)
RBC: 3.73 MIL/uL — AB (ref 4.20–5.80)
RDW: 15.6 % — ABNORMAL HIGH (ref 11.0–15.0)
WBC: 13.5 10*3/uL — AB (ref 3.8–10.8)

## 2016-02-25 LAB — BASIC METABOLIC PANEL
BUN: 8 mg/dL (ref 7–25)
CHLORIDE: 105 mmol/L (ref 98–110)
CO2: 25 mmol/L (ref 20–31)
Calcium: 9.4 mg/dL (ref 8.6–10.3)
Creat: 0.88 mg/dL (ref 0.60–1.35)
Glucose, Bld: 51 mg/dL — ABNORMAL LOW (ref 65–99)
Potassium: 4.4 mmol/L (ref 3.5–5.3)
SODIUM: 139 mmol/L (ref 135–146)

## 2016-02-25 NOTE — Progress Notes (Signed)
Patient is here for FU  Patient complains of pain being present and scaled at a 6. Pain is described as aching in lower back..  Patient has not taken medication today and patient has eaten.

## 2016-02-25 NOTE — Patient Instructions (Signed)
Sickle Cell Anemia, Adult Sickle cell anemia is a condition in which red blood cells have an abnormal "sickle" shape. This abnormal shape shortens the cells' life span, which results in a lower than normal concentration of red blood cells in the blood. The sickle shape also causes the cells to clump together and block free blood flow through the blood vessels. As a result, the tissues and organs of the body do not receive enough oxygen. Sickle cell anemia causes organ damage and pain and increases the risk of infection. CAUSES  Sickle cell anemia is a genetic disorder. Those who receive two copies of the gene have the condition, and those who receive one copy have the trait. RISK FACTORS The sickle cell gene is most common in people whose families originated in [place]. Other areas of the globe where sickle cell trait occurs include the Mediterranean, [place] and [place], the [place], and the [place].  SIGNS AND SYMPTOMS  Pain, especially in the extremities, back, chest, or abdomen (common). The pain may start suddenly or may develop following an illness, especially if there is dehydration. Pain can also occur due to overexertion or exposure to extreme temperature changes.  Frequent severe bacterial infections, especially certain types of pneumonia and meningitis.  Pain and swelling in the hands and feet.  Decreased activity.   Loss of appetite.   Change in behavior.  Headaches.  Seizures.  Shortness of breath or difficulty breathing.  Vision changes.  Skin ulcers. Those with the trait may not have symptoms or they may have mild symptoms.  DIAGNOSIS  Sickle cell anemia is diagnosed with blood tests that demonstrate the genetic trait. It is often diagnosed during the newborn period, due to mandatory testing nationwide. A variety of blood tests, X-rays, CT scans, MRI scans, ultrasounds, and lung function tests may also be done to monitor the condition. TREATMENT  Sickle  cell anemia may be treated with:  Medicines. You may be given pain medicines, antibiotic medicines (to treat and prevent infections) or medicines to increase the production of certain types of hemoglobin.  Fluids.  Oxygen.  Blood transfusions. HOME CARE INSTRUCTIONS   Drink enough fluid to keep your urine clear or pale yellow. Increase your fluid intake in hot weather and during exercise.  Do not smoke. Smoking lowers oxygen levels in the blood.   Only take over-the-counter or prescription medicines for pain, fever, or discomfort as directed by your health care provider.  Take antibiotics as directed by your health care provider. Make sure you finish them it even if you start to feel better.   Take supplements as directed by your health care provider.   Consider wearing a medical alert bracelet. This tells anyone caring for you in an emergency of your condition.   When traveling, keep your medical information, health care provider's names, and the medicines you take with you at all times.   If you develop a fever, do not take medicines to reduce the fever right away. This could cover up a problem that is developing. Notify your health care provider.  Keep all follow-up appointments with your health care provider. Sickle cell anemia requires regular medical care. SEEK MEDICAL CARE IF: You have a fever. SEEK IMMEDIATE MEDICAL CARE IF:   You feel dizzy or faint.   You have new abdominal pain, especially on the left side near the stomach area.   You develop a persistent, often uncomfortable and painful penile erection (priapism). If this is not treated immediately it   will lead to impotence.   You have numbness your arms or legs or you have a hard time moving them.   You have a hard time with speech.   You have a fever or persistent symptoms for more than 2-3 days.   You have a fever and your symptoms suddenly get worse.   You have signs or symptoms of infection.  These include:   Chills.   Abnormal tiredness (lethargy).   Irritability.   Poor eating.   Vomiting.   You develop pain that is not helped with medicine.   You develop shortness of breath.  You have pain in your chest.   You are coughing up pus-like or bloody sputum.   You develop a stiff neck.  Your feet or hands swell or have pain.  Your abdomen appears bloated.  You develop joint pain. MAKE SURE YOU:  Understand these instructions.   This information is not intended to replace advice given to you by your health care provider. Make sure you discuss any questions you have with your health care provider.   Document Released: 09/16/2005 Document Revised: 06/29/2014 Document Reviewed: 01/18/2013 Elsevier Interactive Patient Education 2016 [employer] Inc.  

## 2016-02-25 NOTE — Progress Notes (Signed)
Alejandro Soto, is a 30 y.o. male  [id number]  [id number]  DOB - 1985/08/01  Chief Complaint  Patient presents with  . Follow-up      Subjective:   Alejandro Soto is a 30 y.o. male with history of sickle cell anemia and chronic pain here today for a follow up visit. Patient claims he is doing very well, pain is at baseline, trying to stay healthy, eats healthy, exercise and stay hydrated. He has no complaint today. He is currently on oxycodone 15 mg tablet by mouth 6 hourly when necessary and methadone 5 mg tablet by mouth daily. He wants to get off of methadone and go back to his previous dosage of oxycodone 20 mg tablet by mouth every 6 hourly when necessary. He said he's been told that methadone causes problem with the heart and so he wants to get off of it. He is compliant with his medications, reports no side effects. 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 Patient has No headache, No chest pain, No abdominal pain - No Nausea, No new weakness tingling or numbness, No Cough - SOB. He continues to smoke cigarettes but is cutting down on the numbers. He denies being depressed.  Problem  Sickle Cell Anemia With Pain (Hcc) (Resolved)  Sickle cell crisis (HCC) (Resolved)  Hyperglycemia (Resolved)    ALLERGIES: Allergies  Allergen Reactions  . Amoxicillin Diarrhea   PAST MEDICAL HISTORY: Past Medical History:  Diagnosis Date  . Pneumonia   . Sickle cell anemia ([place])     MEDICATIONS AT HOME: Prior to Admission medications   Medication Sig Start Date End Date Taking? Authorizing Provider  folic acid (FOLVITE) 1 MG tablet Take 1 mg by mouth daily.   Yes Historical Provider, MD  gabapentin (NEURONTIN) 300 MG capsule Take 1 capsule (300 mg total) by mouth 2 (two) times daily. 09/07/13  Yes Leana Gamer, MD  hydroxyurea (HYDREA) 500 MG capsule Take 3 capsules (1,500 mg total) by mouth daily. May take with food to minimize GI side effects. 04/09/15  Yes  Tresa Garter, MD  ibuprofen (ADVIL,MOTRIN) 600 MG tablet Take 1 tablet (600 mg total) by mouth every 8 (eight) hours as needed for mild pain or moderate pain. 10/30/14  Yes Dorena Dew, FNP  methadone (DOLOPHINE) 5 MG tablet Take Methadone 5mg  at bedtime 02/14/16  Yes Micheline Chapman, NP  Multiple Vitamin (MULTIVITAMIN) tablet Take 2 tablets by mouth daily.    Yes Historical Provider, MD  oxyCODONE (ROXICODONE) 15 MG immediate release tablet Take 1 tablet (15 mg total) by mouth every 6 (six) hours as needed for pain. 02/18/16  Yes Tresa Garter, MD   Objective:   Vitals:   02/25/16 1054  BP: (!) 98/59  Pulse: 74  Resp: 18  Temp: 98.2 F (36.8 C)  TempSrc: Oral  SpO2: 98%  Weight: 136 lb (61.7 kg)  Height: 5\' 8"  (1.727 m)   Exam General appearance : Awake, alert, not in any distress. Speech Clear. Not toxic looking HEENT: Atraumatic and Normocephalic, pupils equally reactive to light and accomodation Neck: Supple, no JVD. No cervical lymphadenopathy.  Chest: Good air entry bilaterally, no added sounds  CVS: S1 S2 regular, no murmurs.  Abdomen: Bowel sounds present, Non tender and not distended with no gaurding, rigidity or rebound. Extremities: B/L Lower Ext shows no edema, both legs are warm to touch Neurology: Awake alert, and oriented X 3, CN II-XII intact, Non focal Skin: No Rash  Data  Review Lab Results  Component Value Date   HGBA1C <4.0 09/07/2013   Assessment & Plan   1. Sickle cell disease, type SS (HCC)  - CBC with Differential/Platelet - Basic Metabolic Panel  Continue Hydrea. We discussed the need for good hydration, monitoring of hydration status, avoidance of heat, cold, stress, and infection triggers. We discussed the risks and benefits of Hydrea, including bone marrow suppression, the possibility of GI upset, skin ulcers, hair thinning, and teratogenicity. The patient was reminded of the need to seek medical attention of any symptoms of  bleeding, anemia, or infection. Continue folic acid 1 mg daily to prevent aplastic bone marrow crises.  2. Methadone use ([place])  - Patient just recently refilled methadone for one month - From next refill cycle, will discontinue Methadone and Increase Roxicodone to 20 mg Q 6 H prn - Taper from now, patient has been educated and counseled  3. Chronic pain syndrome  We agreed on Opiate dose and amount of pills  per month. We discussed that pt is to receive Schedule II prescriptions only from our clinic. Pt is also aware that the prescription history is available to [place] online through the [facility] CSRS. Controlled substance agreement reviewed and signed. We reminded [place] that all patients receiving Schedule II narcotics must be seen for follow within one month of prescription being requested. We reviewed the terms of our pain agreement, including the need to keep medicines in a safe locked location away from children or pets, and the need to report excess sedation or constipation, measures to avoid constipation, and policies related to early refills and stolen prescriptions. According to the [place] [facility] program, we have reviewed details related to analgesia, adverse effects and aberrant behaviors.   4. Tobacco dependence  Fernie was counseled on the dangers of tobacco use, and was advised to quit. Reviewed strategies to maximize success, including removing cigarettes and smoking materials from environment, stress management and support of family/friends.  Patient have been counseled extensively about nutrition and exercise  Return in about 3 months (around 05/26/2016) for Sickle Cell Disease/Pain.  The patient was given clear instructions to go to ER or return to medical center if symptoms don't improve, worsen or new problems develop. The patient verbalized understanding. The patient was told to call to get lab results if they haven't heard anything in the next week.   This note has been  created with Surveyor, quantity. Any transcriptional errors are unintentional.    Angelica Chessman, MD, [place], Karilyn Cota, [place] [facility] [place], [place]   02/25/2016, 11:42 AM

## 2016-03-04 ENCOUNTER — Telehealth: Payer: Self-pay

## 2016-03-06 ENCOUNTER — Telehealth: Payer: Self-pay

## 2016-03-06 ENCOUNTER — Other Ambulatory Visit: Payer: Self-pay | Admitting: Family Medicine

## 2016-03-06 NOTE — Telephone Encounter (Signed)
Dr. Doreene Burke, Can this be done. Please advise. Thanks!

## 2016-03-10 ENCOUNTER — Other Ambulatory Visit: Payer: Self-pay | Admitting: Family Medicine

## 2016-03-10 DIAGNOSIS — F1190 Opioid use, unspecified, uncomplicated: Secondary | ICD-10-CM

## 2016-03-10 DIAGNOSIS — D571 Sickle-cell disease without crisis: Secondary | ICD-10-CM

## 2016-03-10 DIAGNOSIS — G894 Chronic pain syndrome: Secondary | ICD-10-CM

## 2016-03-10 DIAGNOSIS — F1120 Opioid dependence, uncomplicated: Secondary | ICD-10-CM

## 2016-03-10 MED ORDER — OXYCODONE HCL 15 MG PO TABS: 15.0000 mg | ORAL_TABLET | Freq: Four times a day (QID) | ORAL | 0 refills | Status: DC | PRN

## 2016-03-10 MED ORDER — METHADONE HCL 5 MG PO TABS: ORAL_TABLET | ORAL | 0 refills | Status: DC

## 2016-03-10 MED FILL — oxyCODONE HCL 20 MG TABS: 20 | 15 days supply | Qty: 60 | Fill #0

## 2016-03-12 ENCOUNTER — Other Ambulatory Visit: Payer: Self-pay | Admitting: Internal Medicine

## 2016-03-12 NOTE — Telephone Encounter (Signed)
We will do this when he is due for refill

## 2016-03-16 ENCOUNTER — Ambulatory Visit: Payer: Medicare Other | Admitting: Family Medicine

## 2016-03-17 ENCOUNTER — Telehealth: Payer: Self-pay | Admitting: *Deleted

## 2016-03-17 NOTE — Telephone Encounter (Signed)
MA unable to leave a voicemail due to system not being set up.  !!!Please inform patient of hemoglobin being stable and electrolytes being normal!!!

## 2016-03-17 NOTE — Telephone Encounter (Signed)
-----   Message from Tresa Garter, MD sent at 03/13/2016  5:28 PM EDT ----- Please inform patient that his hemoglobin is stable, electrolytes are normal.

## 2016-03-23 ENCOUNTER — Telehealth: Payer: Self-pay

## 2016-03-24 ENCOUNTER — Other Ambulatory Visit: Payer: Self-pay | Admitting: Internal Medicine

## 2016-03-24 ENCOUNTER — Other Ambulatory Visit: Payer: Self-pay | Admitting: Family Medicine

## 2016-03-24 DIAGNOSIS — G894 Chronic pain syndrome: Secondary | ICD-10-CM

## 2016-03-24 DIAGNOSIS — D571 Sickle-cell disease without crisis: Secondary | ICD-10-CM

## 2016-03-24 DIAGNOSIS — D5700 Hb-SS disease with crisis, unspecified: Secondary | ICD-10-CM

## 2016-03-24 MED ORDER — OXYCODONE HCL 15 MG PO TABS: 15.0000 mg | ORAL_TABLET | Freq: Four times a day (QID) | ORAL | 0 refills | Status: DC | PRN

## 2016-03-24 MED ORDER — HYDROXYUREA 500 MG PO CAPS: 1500.0000 mg | ORAL_CAPSULE | Freq: Every day | ORAL | 3 refills | Status: DC

## 2016-03-24 MED ORDER — OXYCODONE HCL 20 MG PO TABS: 20.0000 mg | ORAL_TABLET | Freq: Four times a day (QID) | ORAL | 0 refills | Status: DC | PRN

## 2016-03-24 MED FILL — oxyCODONE HCL 20 MG TABS: 20 | 25 days supply | Qty: 90 | Fill #0

## 2016-03-24 MED FILL — HYDROXYUREA 500 MG CAPSULE: 500 | 30 days supply | Qty: 90 | Fill #0

## 2016-04-08 DIAGNOSIS — I517 Cardiomegaly: Secondary | ICD-10-CM | POA: Diagnosis not present

## 2016-04-08 DIAGNOSIS — Z87891 Personal history of nicotine dependence: Secondary | ICD-10-CM | POA: Diagnosis not present

## 2016-04-08 DIAGNOSIS — D5700 Hb-SS disease with crisis, unspecified: Secondary | ICD-10-CM | POA: Diagnosis not present

## 2016-04-08 DIAGNOSIS — J189 Pneumonia, unspecified organism: Secondary | ICD-10-CM | POA: Diagnosis not present

## 2016-04-08 DIAGNOSIS — R110 Nausea: Secondary | ICD-10-CM | POA: Diagnosis not present

## 2016-04-08 DIAGNOSIS — Z880 Allergy status to penicillin: Secondary | ICD-10-CM | POA: Diagnosis not present

## 2016-04-08 DIAGNOSIS — D72829 Elevated white blood cell count, unspecified: Secondary | ICD-10-CM | POA: Diagnosis not present

## 2016-04-08 DIAGNOSIS — R52 Pain, unspecified: Secondary | ICD-10-CM | POA: Diagnosis not present

## 2016-04-08 DIAGNOSIS — R509 Fever, unspecified: Secondary | ICD-10-CM | POA: Diagnosis not present

## 2016-04-08 DIAGNOSIS — K5900 Constipation, unspecified: Secondary | ICD-10-CM | POA: Diagnosis not present

## 2016-04-08 DIAGNOSIS — Z79899 Other long term (current) drug therapy: Secondary | ICD-10-CM | POA: Diagnosis not present

## 2016-04-10 ENCOUNTER — Other Ambulatory Visit: Payer: Self-pay | Admitting: Family Medicine

## 2016-04-10 ENCOUNTER — Telehealth: Payer: Self-pay

## 2016-04-10 NOTE — Telephone Encounter (Signed)
Refill request for oxycodone 20mg. Please advise. Thanks!  

## 2016-04-14 ENCOUNTER — Other Ambulatory Visit: Payer: Self-pay | Admitting: Family Medicine

## 2016-04-14 DIAGNOSIS — G894 Chronic pain syndrome: Secondary | ICD-10-CM

## 2016-04-14 DIAGNOSIS — D571 Sickle-cell disease without crisis: Secondary | ICD-10-CM

## 2016-04-14 MED ORDER — OXYCODONE HCL 20 MG PO TABS: 20.0000 mg | ORAL_TABLET | Freq: Four times a day (QID) | ORAL | 0 refills | Status: DC | PRN

## 2016-04-30 ENCOUNTER — Telehealth: Payer: Self-pay

## 2016-04-30 ENCOUNTER — Other Ambulatory Visit: Payer: Self-pay | Admitting: Internal Medicine

## 2016-04-30 DIAGNOSIS — G894 Chronic pain syndrome: Secondary | ICD-10-CM

## 2016-04-30 DIAGNOSIS — D571 Sickle-cell disease without crisis: Secondary | ICD-10-CM

## 2016-04-30 MED ORDER — OXYCODONE HCL 20 MG PO TABS: 20.0000 mg | ORAL_TABLET | Freq: Four times a day (QID) | ORAL | 0 refills | Status: DC | PRN

## 2016-05-05 ENCOUNTER — Encounter: Payer: Self-pay | Admitting: Internal Medicine

## 2016-05-05 ENCOUNTER — Ambulatory Visit (INDEPENDENT_AMBULATORY_CARE_PROVIDER_SITE_OTHER): Payer: Medicare Other | Admitting: Internal Medicine

## 2016-05-05 VITALS — BP 110/61 | HR 81 | Temp 98.3°F | Resp 18 | Ht 66.0 in | Wt 133.0 lb

## 2016-05-05 DIAGNOSIS — D571 Sickle-cell disease without crisis: Secondary | ICD-10-CM

## 2016-05-05 DIAGNOSIS — Z23 Encounter for immunization: Secondary | ICD-10-CM

## 2016-05-05 DIAGNOSIS — G894 Chronic pain syndrome: Secondary | ICD-10-CM

## 2016-05-05 DIAGNOSIS — F17200 Nicotine dependence, unspecified, uncomplicated: Secondary | ICD-10-CM

## 2016-05-05 LAB — CBC WITH DIFFERENTIAL/PLATELET
BASOS PCT: 0 %
Basophils Absolute: 0 cells/uL (ref 0–200)
EOS PCT: 2 %
Eosinophils Absolute: 222 cells/uL (ref 15–500)
HEMATOCRIT: 32.2 % — AB (ref 38.5–50.0)
HEMOGLOBIN: 10.5 g/dL — AB (ref 13.2–17.1)
LYMPHS ABS: 3552 {cells}/uL (ref 850–3900)
Lymphocytes Relative: 32 %
MCH: 31.1 pg (ref 27.0–33.0)
MCHC: 32.6 g/dL (ref 32.0–36.0)
MCV: 95.3 fL (ref 80.0–100.0)
MONO ABS: 999 {cells}/uL — AB (ref 200–950)
MPV: 8.8 fL (ref 7.5–12.5)
Monocytes Relative: 9 %
NEUTROS ABS: 6327 {cells}/uL (ref 1500–7800)
NEUTROS PCT: 57 %
Platelets: 413 10*3/uL — ABNORMAL HIGH (ref 140–400)
RBC: 3.38 MIL/uL — AB (ref 4.20–5.80)
RDW: 20.3 % — ABNORMAL HIGH (ref 11.0–15.0)
WBC: 11.1 10*3/uL — AB (ref 3.8–10.8)

## 2016-05-05 MED FILL — oxyCODONE HCL 20 MG TABS: 20 | 23 days supply | Qty: 90 | Fill #0

## 2016-05-05 NOTE — Progress Notes (Signed)
Alejandro Soto, is a 30 y.o. male  [id number]  [id number]  DOB - April 29, 1986  Chief Complaint  Patient presents with  . Hospitalization Follow-up       Subjective:   Alejandro Soto is a 30 y.o. male here today for a follow up hospital discharge visit. Patient was recently admitted to a hospital in [place] for initially Sickle Cell Crises, but later told he had Pneumonia, his WBC was in the 20s, he had fever. Was treated for about a week inpatient. He was discharged 2 weeks ago to follow up today. He is doing well since discharge, no cough, no more fever. He needs refill of his pain medications. He needs flu shot today. He is up to date with pneumococcal vaccine. He still smokes cigarette but trying to quit. He thinks he has lived this long because God wants him to do something for Him and he is about ready to do just that. He denies outright depression, he does not have any suicidal ideation or thought. Patient has No headache, No chest pain, No abdominal pain - No Nausea, No new weakness tingling or numbness, No Cough - SOB.  No problems updated.  ALLERGIES: Allergies  Allergen Reactions  . Amoxicillin Diarrhea   PAST MEDICAL HISTORY: Past Medical History:  Diagnosis Date  . Pneumonia   . Sickle cell anemia ([place])    MEDICATIONS AT HOME: Prior to Admission medications   Medication Sig Start Date End Date Taking? Authorizing Provider  folic acid (FOLVITE) 1 MG tablet Take 1 mg by mouth daily.   Yes Historical Provider, MD  gabapentin (NEURONTIN) 300 MG capsule Take 1 capsule (300 mg total) by mouth 2 (two) times daily. 09/07/13  Yes Leana Gamer, MD  hydroxyurea (HYDREA) 500 MG capsule Take 3 capsules (1,500 mg total) by mouth daily. May take with food to minimize GI side effects. 03/24/16  Yes Micheline Chapman, NP  ibuprofen (ADVIL,MOTRIN) 600 MG tablet Take 1 tablet (600 mg total) by mouth every 8 (eight) hours as needed for mild pain or moderate pain. 10/30/14  Yes  Dorena Dew, FNP  Multiple Vitamin (MULTIVITAMIN) tablet Take 2 tablets by mouth daily.    Yes Historical Provider, MD  Oxycodone HCl 20 MG TABS Take 1 tablet (20 mg total) by mouth every 6 (six) hours as needed. 04/30/16 05/15/16 Yes Tresa Garter, MD   Objective:   Vitals:   05/05/16 0837  BP: 110/61  Pulse: 81  Resp: 18  Temp: 98.3 F (36.8 C)  TempSrc: Oral  SpO2: 100%  Weight: 133 lb (60.3 kg)  Height: 5\' 6"  (1.676 m)   Exam General appearance : Awake, alert, not in any distress. Speech Clear. Not toxic looking HEENT: Atraumatic and Normocephalic, pupils equally reactive to light and accomodation Neck: Supple, no JVD. No cervical lymphadenopathy.  Chest: Good air entry bilaterally, no added sounds  CVS: S1 S2 regular, no murmurs.  Abdomen: Bowel sounds present, Non tender and not distended with no gaurding, rigidity or rebound. Extremities: B/L Lower Ext shows no edema, both legs are warm to touch Neurology: Awake alert, and oriented X 3, CN II-XII intact, Non focal Skin: No Rash  Data Review Lab Results  Component Value Date   HGBA1C <4.0 09/07/2013   Assessment & Plan   1. Needs flu shot  - Flu Vaccine QUAD 36+ mos PF IM (Fluarix & Fluzone Quad PF)  2. Sickle cell disease, type SS (HCC)  - CBC with Differential/Platelet  Continue Hydrea. We discussed the need for good hydration, monitoring of hydration status, avoidance of heat, cold, stress, and infection triggers. We discussed the risks and benefits of Hydrea, including bone marrow suppression, the possibility of GI upset, skin ulcers, hair thinning, and teratogenicity. The patient was reminded of the need to seek medical attention of any symptoms of bleeding, anemia, or infection. Continue folic acid 1 mg daily to prevent aplastic bone marrow crises.  3. Chronic pain syndrome  We agreed on Opiate dose and amount of pills  per month. We discussed that pt is to receive Schedule II prescriptions only  from our clinic. Pt is also aware that the prescription history is available to [place] online through the [facility] CSRS. Controlled substance agreement reviewed and signed. We reminded Alejandro Soto that all patients receiving Schedule II narcotics must be seen for follow within one month of prescription being requested. We reviewed the terms of our pain agreement, including the need to keep medicines in a safe locked location away from children or pets, and the need to report excess sedation or constipation, measures to avoid constipation, and policies related to early refills and stolen prescriptions. According to the [place] [facility] program, we have reviewed details related to analgesia, adverse effects and aberrant behaviors.  4. Tobacco dependence  Alejandro Soto was counseled on the dangers of tobacco use, and was advised to quit. Reviewed strategies to maximize success, including removing cigarettes and smoking materials from environment, stress management and support of family/friends.  Patient have been counseled extensively about nutrition and exercise. Other issues discussed during this visit include: low cholesterol diet, weight control and daily exercise, annual eye examinations at Ophthalmology, importance of adherence with medications and regular follow-up.   Return in about 2 months (around 07/05/2016) for Sickle Cell Disease/Pain.  The patient was given clear instructions to go to ER or return to medical center if symptoms don't improve, worsen or new problems develop. The patient verbalized understanding. The patient was told to call to get lab results if they haven't heard anything in the next week.   This note has been created with Surveyor, quantity. Any transcriptional errors are unintentional.    Angelica Chessman, MD, [place], Karilyn Cota, [place] [facility] [place], [place]   05/05/2016, 9:07 AM

## 2016-05-05 NOTE — Patient Instructions (Signed)
Sickle Cell Anemia, Adult °Sickle cell anemia is a condition in which red blood cells have an abnormal “sickle” shape. This abnormal shape shortens the cells’ life span, which results in a lower than normal concentration of red blood cells in the blood. The sickle shape also causes the cells to clump together and block free blood flow through the blood vessels. As a result, the tissues and organs of the body do not receive enough oxygen. Sickle cell anemia causes organ damage and pain and increases the risk of infection. °What are the causes? °Sickle cell anemia is a genetic disorder. Those who receive two copies of the gene have the condition, and those who receive one copy have the trait. °What increases the risk? °The sickle cell gene is most common in people whose families originated in [place]. Other areas of the globe where sickle cell trait occurs include the Mediterranean, South and [place], the [place], and the [place]. °What are the signs or symptoms? °· Pain, especially in the extremities, back, chest, or abdomen (common). The pain may start suddenly or may develop following an illness, especially if there is dehydration. Pain can also occur due to overexertion or exposure to extreme temperature changes. °· Frequent severe bacterial infections, especially certain types of pneumonia and meningitis. °· Pain and swelling in the hands and feet. °· Decreased activity. °· Loss of appetite. °· Change in behavior. °· Headaches. °· Seizures. °· Shortness of breath or difficulty breathing. °· Vision changes. °· Skin ulcers. °Those with the trait may not have symptoms or they may have mild symptoms. °How is this diagnosed? °Sickle cell anemia is diagnosed with blood tests that demonstrate the genetic trait. It is often diagnosed during the newborn period, due to mandatory testing nationwide. A variety of blood tests, X-rays, CT scans, MRI scans, ultrasounds, and lung function tests may also be done to  monitor the condition. °How is this treated? °Sickle cell anemia may be treated with: °· Medicines. You may be given pain medicines, antibiotic medicines (to treat and prevent infections) or medicines to increase the production of certain types of hemoglobin. °· Fluids. °· Oxygen. °· Blood transfusions. ° °Follow these instructions at home: °· Drink enough fluid to keep your urine clear or pale yellow. Increase your fluid intake in hot weather and during exercise. °· Do not smoke. Smoking lowers oxygen levels in the blood. °· Only take over-the-counter or prescription medicines for pain, fever, or discomfort as directed by your health care provider. °· Take antibiotics as directed by your health care provider. Make sure you finish them it even if you start to feel better. °· Take supplements as directed by your health care provider. °· Consider wearing a medical alert bracelet. This tells anyone caring for you in an emergency of your condition. °· When traveling, keep your medical information, health care provider's names, and the medicines you take with you at all times. °· If you develop a fever, do not take medicines to reduce the fever right away. This could cover up a problem that is developing. Notify your health care provider. °· Keep all follow-up appointments with your health care provider. Sickle cell anemia requires regular medical care. °Contact a health care provider if: °You have a fever. °Get help right away if: °· You feel dizzy or faint. °· You have new abdominal pain, especially on the left side near the stomach area. °· You develop a persistent, often uncomfortable and painful penile erection (priapism). If this is not   treated immediately it will lead to impotence. °· You have numbness your arms or legs or you have a hard time moving them. °· You have a hard time with speech. °· You have a fever or persistent symptoms for more than 2-3 days. °· You have a fever and your symptoms suddenly get  worse. °· You have signs or symptoms of infection. These include: °? Chills. °? Abnormal tiredness (lethargy). °? Irritability. °? Poor eating. °? Vomiting. °· You develop pain that is not helped with medicine. °· You develop shortness of breath. °· You have pain in your chest. °· You are coughing up pus-like or bloody sputum. °· You develop a stiff neck. °· Your feet or hands swell or have pain. °· Your abdomen appears bloated. °· You develop joint pain. °This information is not intended to replace advice given to you by your health care provider. Make sure you discuss any questions you have with your health care provider. °Document Released: 09/16/2005 Document Revised: 12/27/2015 Document Reviewed: 01/18/2013 °Elsevier Interactive Patient Education © 2017 Elsevier Inc. ° °

## 2016-05-05 NOTE — Progress Notes (Signed)
Patient is here for HFU  Patient complains of back pain being present and scaled at a 5.  Patient has not taken medication today. Patient has not eaten today.  Patient would like the flu vaccine today.

## 2016-05-08 ENCOUNTER — Telehealth: Payer: Self-pay | Admitting: *Deleted

## 2016-05-08 NOTE — Telephone Encounter (Signed)
-----   Message from Tresa Garter, MD sent at 05/08/2016 10:10 AM EST ----- Please inform patient that his lab results are stable, white cell count has improved. Continue aspirin and other medications

## 2016-05-08 NOTE — Telephone Encounter (Signed)
Medical Assistant left message on patient's home and cell voicemail. Voicemail states to give a call back to Nubia with [facility] at [phone].  

## 2016-05-25 ENCOUNTER — Telehealth: Payer: Self-pay

## 2016-05-25 ENCOUNTER — Other Ambulatory Visit: Payer: Self-pay | Admitting: Internal Medicine

## 2016-05-25 MED ORDER — OXYCODONE HCL 20 MG PO TABS: 1.0000 | ORAL_TABLET | Freq: Four times a day (QID) | ORAL | 0 refills | Status: DC | PRN

## 2016-05-25 NOTE — Telephone Encounter (Signed)
Refill request for oxycodone. Please advise. Thanks!  

## 2016-05-25 NOTE — Telephone Encounter (Signed)
Reviewed [place] Substance Reporting system prior to prescribing opiate medication. No inconsistencies noted.  Meds ordered this encounter  Medications  . Oxycodone HCl 20 MG TABS    Sig: Take 1 tablet (20 mg total) by mouth every 6 (six) hours as needed.    Dispense:  90 tablet    Refill:  0    Hodges Treiber M, FNP

## 2016-05-26 MED FILL — HYDROXYUREA 500 MG CAPSULE: 500 | 30 days supply | Qty: 90 | Fill #1

## 2016-05-26 MED FILL — oxyCODONE HCL 20 MG TABS: 20 | 23 days supply | Qty: 90 | Fill #0

## 2016-06-02 ENCOUNTER — Ambulatory Visit: Payer: Medicare Other | Admitting: Internal Medicine

## 2016-06-08 ENCOUNTER — Telehealth: Payer: Self-pay

## 2016-06-09 ENCOUNTER — Other Ambulatory Visit: Payer: Self-pay | Admitting: Internal Medicine

## 2016-06-09 MED ORDER — OXYCODONE HCL 20 MG PO TABS: 1.0000 | ORAL_TABLET | Freq: Four times a day (QID) | ORAL | 0 refills | Status: DC | PRN

## 2016-07-01 ENCOUNTER — Telehealth: Payer: Self-pay

## 2016-07-02 ENCOUNTER — Other Ambulatory Visit: Payer: Self-pay | Admitting: Internal Medicine

## 2016-07-02 MED ORDER — OXYCODONE HCL 20 MG PO TABS: 1.0000 | ORAL_TABLET | Freq: Four times a day (QID) | ORAL | 0 refills | Status: DC | PRN

## 2016-07-03 MED FILL — oxyCODONE HCL 20 MG TABS: 20 | 22 days supply | Qty: 90 | Fill #0

## 2016-07-14 ENCOUNTER — Encounter: Payer: Self-pay | Admitting: Family Medicine

## 2016-07-14 ENCOUNTER — Non-Acute Institutional Stay (HOSPITAL_COMMUNITY)
Admission: AD | Admit: 2016-07-14 | Discharge: 2016-07-14 | Disposition: A | Discharge status: Home / Self Care | Payer: Medicare Other | Source: Ambulatory Visit | Attending: Internal Medicine | Admitting: Internal Medicine

## 2016-07-14 ENCOUNTER — Ambulatory Visit (INDEPENDENT_AMBULATORY_CARE_PROVIDER_SITE_OTHER): Payer: Medicare Other | Admitting: Family Medicine

## 2016-07-14 VITALS — BP 132/75 | HR 70 | Temp 98.6°F | Resp 14 | Ht 68.0 in | Wt 132.0 lb

## 2016-07-14 DIAGNOSIS — Z791 Long term (current) use of non-steroidal anti-inflammatories (NSAID): Secondary | ICD-10-CM | POA: Insufficient documentation

## 2016-07-14 DIAGNOSIS — D5700 Hb-SS disease with crisis, unspecified: Secondary | ICD-10-CM | POA: Insufficient documentation

## 2016-07-14 DIAGNOSIS — Z114 Encounter for screening for human immunodeficiency virus [HIV]: Secondary | ICD-10-CM | POA: Diagnosis not present

## 2016-07-14 DIAGNOSIS — F17210 Nicotine dependence, cigarettes, uncomplicated: Secondary | ICD-10-CM | POA: Insufficient documentation

## 2016-07-14 DIAGNOSIS — M545 Low back pain: Secondary | ICD-10-CM | POA: Diagnosis present

## 2016-07-14 DIAGNOSIS — Z79899 Other long term (current) drug therapy: Secondary | ICD-10-CM | POA: Insufficient documentation

## 2016-07-14 DIAGNOSIS — F1190 Opioid use, unspecified, uncomplicated: Secondary | ICD-10-CM

## 2016-07-14 LAB — RETICULOCYTES
RBC.: 3.7 MIL/uL — ABNORMAL LOW (ref 4.22–5.81)
Retic Count, Absolute: 314.5 10*3/uL — ABNORMAL HIGH (ref 19.0–186.0)
Retic Ct Pct: 8.5 % — ABNORMAL HIGH (ref 0.4–3.1)

## 2016-07-14 LAB — COMPREHENSIVE METABOLIC PANEL
ALT: 15 U/L — ABNORMAL LOW (ref 17–63)
AST: 23 U/L (ref 15–41)
Albumin: 5.2 g/dL — ABNORMAL HIGH (ref 3.5–5.0)
Alkaline Phosphatase: 48 U/L (ref 38–126)
Anion gap: 6 (ref 5–15)
BUN: 15 mg/dL (ref 6–20)
CO2: 26 mmol/L (ref 22–32)
Calcium: 9.4 mg/dL (ref 8.9–10.3)
Chloride: 105 mmol/L (ref 101–111)
Creatinine, Ser: 0.76 mg/dL (ref 0.61–1.24)
GFR calc non Af Amer: >60 mL/min (ref >60)
Glucose, Bld: 82 mg/dL (ref 65–99)
POTASSIUM: 4.1 mmol/L (ref 3.5–5.1)
Sodium: 137 mmol/L (ref 135–145)
Total Bilirubin: 1.2 mg/dL (ref 0.3–1.2)
Total Protein: 8 g/dL (ref 6.5–8.1)

## 2016-07-14 LAB — CBC WITH DIFFERENTIAL/PLATELET
Basophils Absolute: 0 10*3/uL (ref 0.0–0.1)
Basophils Relative: 0 %
Eosinophils Absolute: 0.2 10*3/uL (ref 0.0–0.7)
Eosinophils Relative: 1 %
HCT: 32.6 % — ABNORMAL LOW (ref 39.0–52.0)
HEMOGLOBIN: 11.6 g/dL — AB (ref 13.0–17.0)
LYMPHS ABS: 4 10*3/uL (ref 0.7–4.0)
LYMPHS PCT: 26 %
MCH: 31.4 pg (ref 26.0–34.0)
MCHC: 35.6 g/dL (ref 30.0–36.0)
MCV: 88.1 fL (ref 78.0–100.0)
Monocytes Absolute: 1 10*3/uL (ref 0.1–1.0)
Monocytes Relative: 7 %
NEUTROS PCT: 66 %
Neutro Abs: 10 10*3/uL — ABNORMAL HIGH (ref 1.7–7.7)
Platelets: 487 10*3/uL — ABNORMAL HIGH (ref 150–400)
RBC: 3.7 MIL/uL — ABNORMAL LOW (ref 4.22–5.81)
RDW: 17.1 % — ABNORMAL HIGH (ref 11.5–15.5)
WBC: 15.2 10*3/uL — ABNORMAL HIGH (ref 4.0–10.5)

## 2016-07-14 LAB — HIV ANTIBODY (ROUTINE TESTING W REFLEX): HIV 1&2 Ab, 4th Generation: NONREACTIVE

## 2016-07-14 MED ORDER — HYDROMORPHONE HCL 2 MG/ML IJ SOLN
2.0000 mg | Freq: Once | INTRAMUSCULAR | Status: AC
  Administered 2016-07-14: 2 mg via INTRAVENOUS
  Filled 2016-07-14: qty 1

## 2016-07-14 MED ORDER — OXYCODONE HCL 5 MG PO TABS
10.0000 mg | ORAL_TABLET | Freq: Once | ORAL | Status: AC
  Administered 2016-07-14: 10 mg via ORAL
  Filled 2016-07-14: qty 2

## 2016-07-14 MED ORDER — KETOROLAC TROMETHAMINE 30 MG/ML IJ SOLN
30.0000 mg | Freq: Once | INTRAMUSCULAR | Status: AC
  Administered 2016-07-14: 30 mg via INTRAVENOUS
  Filled 2016-07-14: qty 1

## 2016-07-14 MED ORDER — DEXTROSE-NACL 5-0.45 % IV SOLN
INTRAVENOUS | Status: DC
  Administered 2016-07-14: 12:00:00 via INTRAVENOUS

## 2016-07-14 MED ORDER — HYDROMORPHONE HCL 2 MG/ML IJ SOLN
1.0000 mg | Freq: Once | INTRAMUSCULAR | Status: AC
  Administered 2016-07-14: 1 mg via INTRAVENOUS
  Filled 2016-07-14: qty 1

## 2016-07-14 MED ORDER — HYDROMORPHONE HCL 2 MG/ML IJ SOLN
0.5000 mg | Freq: Once | INTRAMUSCULAR | Status: AC
  Administered 2016-07-14: 0.5 mg via INTRAVENOUS
  Filled 2016-07-14: qty 1

## 2016-07-14 NOTE — Patient Instructions (Addendum)
Sickle Cell Anemia, Adult °Sickle cell anemia is a condition where your red blood cells are shaped like sickles. Red blood cells carry oxygen through the body. Sickle-shaped red blood cells do not live as long as normal red blood cells. They also clump together and block blood from flowing through the blood vessels. These things prevent the body from getting enough oxygen. Sickle cell anemia causes organ damage and pain. It also increases the risk of infection. °Follow these instructions at home: °· Drink enough fluid to keep your pee (urine) clear or pale yellow. Drink more in hot weather and during exercise. °· Do not smoke. Smoking lowers oxygen levels in the blood. °· Only take over-the-counter or prescription medicines as told by your doctor. °· Take antibiotic medicines as told by your doctor. Make sure you finish them even if you start to feel better. °· Take supplements as told by your doctor. °· Consider wearing a medical alert bracelet. This tells anyone caring for you in an emergency of your condition. °· When traveling, keep your medical information, doctors' names, and the medicines you take with you at all times. °· If you have a fever, do not take fever medicines right away. This could cover up a problem. Tell your doctor. °· Keep all follow-up visits with your doctor. Sickle cell anemia requires regular medical care. °Contact a doctor if: °You have a fever. °Get help right away if: °· You feel dizzy or faint. °· You have new belly (abdominal) pain, especially on the left side near the stomach area. °· You have a lasting, often uncomfortable and painful erection of the penis (priapism). If it is not treated right away, you will become unable to have sex (impotence). °· You have numbness in your arms or legs or you have a hard time moving them. °· You have a hard time talking. °· You have a fever or lasting symptoms for more than 2-3 days. °· You have a fever and your symptoms suddenly get  worse. °· You have signs or symptoms of infection. These include: °? Chills. °? Being more tired than normal (lethargy). °? Irritability. °? Poor eating. °? Throwing up (vomiting). °· You have pain that is not helped with medicine. °· You have shortness of breath. °· You have pain in your chest. °· You are coughing up pus-like or bloody mucus. °· You have a stiff neck. °· Your feet or hands swell or have pain. °· Your belly looks bloated. °· Your joints hurt. °This information is not intended to replace advice given to you by your health care provider. Make sure you discuss any questions you have with your health care provider. °Document Released: 03/29/2013 Document Revised: 11/14/2015 Document Reviewed: 01/18/2013 °Elsevier Interactive Patient Education © 2017 Elsevier Inc. ° °

## 2016-07-14 NOTE — Discharge Summary (Signed)
[facility] Discharge Summary   Patient ID: Alejandro Soto MRN: [id number] DOB/AGE: [birth year] 31 y.o.  Admit date: 07/14/2016 Discharge date: 07/14/2016  Primary Care Physician:  Dorena Dew, FNP  Admission Diagnoses:  Active Problems:   Sickle cell pain crisis [facility])  Discharge Medications:  Allergies as of 07/14/2016      Reactions   Amoxicillin Diarrhea      Medication List    TAKE these medications   folic acid 1 MG tablet Commonly known as:  FOLVITE Take 1 mg by mouth daily.   gabapentin 300 MG capsule Commonly known as:  NEURONTIN Take 1 capsule (300 mg total) by mouth 2 (two) times daily.   hydroxyurea 500 MG capsule Commonly known as:  HYDREA Take 3 capsules (1,500 mg total) by mouth daily. May take with food to minimize GI side effects.   ibuprofen 600 MG tablet Commonly known as:  ADVIL,MOTRIN Take 1 tablet (600 mg total) by mouth every 8 (eight) hours as needed for mild pain or moderate pain.   multivitamin tablet Take 2 tablets by mouth daily.   Oxycodone HCl 20 MG Tabs Take 1 tablet (20 mg total) by mouth every 6 (six) hours as needed.        Consults:  None  Significant Diagnostic Studies:  No results found.   [facility] Course: [name] transitioned to day infusion center for extended observation. He was started on Dilaudid IV per weight based rapid redosing. He received a total of 3.5 mg. Pain intensity decreased from 7/10 to 4/10. Patient reports that he can manage at home on current medication regimen.Patient is alert, oriented and ambulatory.    Recommend that patient increase hydration and take medications consistently.   The patient was given clear instructions to go to ER or return to medical center if symptoms do not improve, worsen or new problems develop. The patient verbalized understanding.  Physical Exam at Discharge:  BP 118/62 (BP Location: Left Arm)   Pulse 70   Temp 98.4 F (36.9  C) (Oral)   Resp 18   Ht 5\' 7"  (1.702 m)   Wt 132 lb (59.9 kg)   SpO2 100%   BMI 20.67 kg/m    General Appearance:    Alert, cooperative, no distress, appears stated age  Head:    Normocephalic, without obvious abnormality, atraumatic  Back:     Symmetric, no curvature, ROM normal, no CVA tenderness  Lungs:     Clear to auscultation bilaterally, respirations unlabored  Chest wall:    No tenderness or deformity  Heart:    Regular rate and rhythm, S1 and S2 normal, no murmur, rub   or gallop  Abdomen:     Soft, non-tender, bowel sounds active all four quadrants,    no masses, no organomegaly    Disposition at Discharge: 01-Home or Self Care  Discharge Orders: Discharge Instructions    Discharge patient    Complete by:  As directed    Discharge disposition:  01-Home or Self Care   Discharge patient date:  07/14/2016      Condition at Discharge:   Stable  Time spent on Discharge:  15 minutes  Signed: Lalitha Ilyas M 07/14/2016, 2:17 PM

## 2016-07-14 NOTE — Progress Notes (Signed)
Subjective:    Patient ID: Alejandro Soto, male    DOB: Jul 23, 1985, 31 y.o.   MRN: [id number]  HPI Alejandro Soto, a 31 year old male with a history of sickle cell anemia, HbSS presents for follow up of sickle cell anemia and chronic pain. Alejandro Soto states that he currently has pain to his lower back. Patient maintains that current pain intensity is 7/10 described as constant and aching. He last had Oxycodone 20 mg this am with moderate relief. Patient is opiate tolerant.   Patient states that he is taking all other medications consistently. Alejandro Soto denies headache, chest pains, shortness of breath, fatigue, nausea, vomiting or diarrhea.  Past Medical History:  Diagnosis Date  . Pneumonia   . Sickle cell anemia (HCC)    Social History   Social History  . Marital status: Single    Spouse name: N/A  . Number of children: N/A  . Years of education: N/A   Occupational History  . Not on file.   Social History Main Topics  . Smoking status: Current Some Day Smoker    Packs/day: 0.25    Years: 5.00    Types: Cigarettes  . Smokeless tobacco: Never Used  . Alcohol use No  . Drug use: No  . Sexual activity: Yes   Other Topics Concern  . Not on file   Social History Narrative  . No narrative on file   Immunization History  Administered Date(s) Administered  . Influenza Whole 03/21/2012  . Influenza,inj,Quad PF,36+ Mos 03/21/2013, 03/01/2014  . Pneumococcal Conjugate-13 03/21/2012  . Pneumococcal Polysaccharide-23 07/15/2014   Review of Systems  Constitutional: Negative.  Negative for fatigue and fever.  HENT: Negative.   Eyes: Negative.  Negative for visual disturbance.  Respiratory: Negative.   Cardiovascular: Negative.   Endocrine: Negative.  Negative for polydipsia, polyphagia and polyuria.  Genitourinary: Negative.   Musculoskeletal: Positive for back pain and myalgias (low extremity pain).  Skin: Negative.   Allergic/Immunologic: Negative.   Neurological:  Negative.   Hematological: Negative.   Psychiatric/Behavioral: Negative.  Negative for suicidal ideas.      Objective:   Physical Exam  Constitutional: He is oriented to person, place, and time. He appears well-developed and well-nourished.  HENT:  Head: Normocephalic and atraumatic.  Right Ear: External ear normal.  Left Ear: External ear normal.  Nose: Nose normal.  Mouth/Throat: Oropharynx is clear and moist.  Eyes: Conjunctivae and EOM are normal. Pupils are equal, round, and reactive to light.  Neck: Normal range of motion. Neck supple.  Cardiovascular: Normal rate, regular rhythm, normal heart sounds and intact distal pulses.   Pulmonary/Chest: Effort normal and breath sounds normal.  Abdominal: Soft. Bowel sounds are normal.  Musculoskeletal:       Lumbar back: He exhibits decreased range of motion and pain.  Neurological: He is alert and oriented to person, place, and time. He has normal reflexes.  Skin: Skin is warm and dry.  Psychiatric: He has a normal mood and affect. His behavior is normal. Judgment and thought content normal.     BP 132/75 (BP Location: Right Arm, Patient Position: Sitting, Cuff Size: Normal)   Pulse 70   Temp 98.6 F (37 C) (Oral)   Resp 14   Ht 5\' 8"  (1.727 m)   Wt 132 lb (59.9 kg)   SpO2 99%   BMI 20.07 kg/m  Assessment & Plan:   1. Sickle cell disease, type SS Patient will transition to day infusion center for  extended observation.  He will receive D5.45 for cellular rehydration Will start dilaudid IV per weight based rapid re-dosing protocol.  Sickle cell disease - Continue Hydrea 1500 mg, 30 mg/kg/day.  Discussed possibility of increasing.  We discussed the need for good hydration, monitoring of hydration status, avoidance of heat, cold, stress, and infection triggers. We discussed the risks and benefits of Hydrea, including bone marrow suppression, the possibility of GI upset, skin ulcers, hair thinning, and teratogenicity. The patient  was reminded of the need to seek medical attention of any symptoms of bleeding, anemia, or infection. Continue folic acid 1 mg daily to prevent aplastic bone marrow crises. Pt states that he and fiance are considering having a child, discussed the importance of discontinuing use of Hydroxyurea prior to conceiving.   Platelet count mildly elevated, will re-check platelet count, if it remains elevated will start a trial of Aspirin 81 mg.   Pulmonary evaluation - Patient denies severe recurrent wheezes, shortness of breath with exercise, or persistent cough. If these symptoms develop, pulmonary function tests with spirometry will be ordered, and if abnormal, plan on referral to Pulmonology for further evaluation.  Cardiac - Routine screening for pulmonary hypertension is not recommended.  Eye - High risk of proliferative retinopathy. Annual eye exam with retinal exam recommended to patient. Sent referral, patient has missed last several appointments.    Immunization status - Alejandro Soto is up to date with vaccinations. - CBC with Differential   2. Screening for HIV (human immunodeficiency virus)  - HIV antibody (with reflex)   3. Opiate use - Drug Screen (9) + Creatinine, Ur   RTC: Patient will follow up in 1 month for medication management   The patient was given clear instructions to go to ER or return to medical center if symptoms do not improve, worsen or new problems develop. The patient verbalized understanding. Will notify patient with laboratory results.  Dorena Dew, FNP

## 2016-07-14 NOTE — H&P (Signed)
[facility] History and Physical   Date: 07/14/2016  Patient name: Alejandro Soto Medical record number: [id number] Date of birth: Mar 23, 1986 Age: 31 y.o. Gender: male PCP: Dorena Dew, FNP  Attending physician: Tresa Garter, MD  Chief Complaint: Low back pain  History of Present Illness: [name], a 31 year old patient with a history of sickle cell anemia, HbSS presents complaining of low back pain over the past 3-4 days. Patient attributes current pain crisis to weather changes. He says that current pain intensity is 7/10 to lower back. Pain is described as constant and throbbing. He is opiate tolerant, he last had Oxycodone 20 mg this am without sustained relief. He denies headache, blurred vision, shortness of breath, chest pain, dysuria, nausea, vomiting, or diarrhea.   Meds: Prescriptions Prior to Admission  Medication Sig Dispense Refill Last Dose  . folic acid (FOLVITE) 1 MG tablet Take 1 mg by mouth daily.   Taking  . gabapentin (NEURONTIN) 300 MG capsule Take 1 capsule (300 mg total) by mouth 2 (two) times daily. 60 capsule 2 Taking  . hydroxyurea (HYDREA) 500 MG capsule Take 3 capsules (1,500 mg total) by mouth daily. May take with food to minimize GI side effects. 90 capsule 3 Taking  . ibuprofen (ADVIL,MOTRIN) 600 MG tablet Take 1 tablet (600 mg total) by mouth every 8 (eight) hours as needed for mild pain or moderate pain. 30 tablet 1 Taking  . Multiple Vitamin (MULTIVITAMIN) tablet Take 2 tablets by mouth daily.    Taking  . Oxycodone HCl 20 MG TABS Take 1 tablet (20 mg total) by mouth every 6 (six) hours as needed. 90 tablet 0 Taking    Allergies: Amoxicillin Past Medical History:  Diagnosis Date  . Pneumonia   . Sickle cell anemia (HCC)    Past Surgical History:  Procedure Laterality Date  . NO PAST SURGERIES     Family History  Problem Relation Age of Onset  . Cancer Maternal Grandmother     colon   . Diabetes Father   .  Cancer Father 11    Prostate  . Asthma Brother    Social History   Social History  . Marital status: Single    Spouse name: N/A  . Number of children: N/A  . Years of education: N/A   Occupational History  . Not on file.   Social History Main Topics  . Smoking status: Current Some Day Smoker    Packs/day: 0.25    Years: 5.00    Types: Cigarettes  . Smokeless tobacco: Never Used  . Alcohol use No  . Drug use: No  . Sexual activity: Yes   Other Topics Concern  . Not on file   Social History Narrative  . No narrative on file    Review of Systems: Constitutional: negative for fatigue and night sweats Eyes: negative Ears, nose, mouth, throat, and face: negative Respiratory: negative Cardiovascular: negative Gastrointestinal: negative Genitourinary:negative Integument/breast: negative Hematologic/lymphatic: negative Musculoskeletal:positive for arthralgias and myalgias Neurological: negative Behavioral/Psych: negative Endocrine: negative  Physical Exam: There were no vitals taken for this visit. BP 118/62 (BP Location: Left Arm)   Pulse 70   Temp 98.4 F (36.9 C) (Oral)   Resp 18   Ht 5\' 7"  (1.702 Soto)   Wt 132 lb (59.9 kg)   SpO2 100%   BMI 20.67 kg/Soto   General Appearance:    Alert, cooperative, no distress, appears stated age  Head:    Normocephalic, without  obvious abnormality, atraumatic  Eyes:    PERRL, conjunctiva/corneas clear, EOM's intact, fundi    benign, both eyes       Ears:    Normal TM's and external ear canals, both ears  Nose:   Nares normal, septum midline, mucosa normal, no drainage    or sinus tenderness  Throat:   Lips, mucosa, and tongue normal; teeth and gums normal  Neck:   Supple, symmetrical, trachea midline, no adenopathy;       thyroid:  No enlargement/tenderness/nodules; no carotid   bruit or JVD  Back:     Symmetric, no curvature, ROM normal, no CVA tenderness  Lungs:     Clear to auscultation bilaterally, respirations  unlabored  Chest wall:    No tenderness or deformity  Heart:    Regular rate and rhythm, S1 and S2 normal, no murmur, rub   or gallop  Abdomen:     Soft, non-tender, bowel sounds active all four quadrants,    no masses, no organomegaly  Extremities:   Extremities normal, atraumatic, no cyanosis or edema  Pulses:   2+ and symmetric all extremities  Skin:   Skin color, texture, turgor normal, no rashes or lesions  Lymph nodes:   Cervical, supraclavicular, and axillary nodes normal  Neurologic:   CNII-XII intact. Normal strength, sensation and reflexes      throughout    Lab results: No results found for this or any previous visit (from the past 24 hour(s)).  Imaging results:  No results found.   Assessment & Plan:  Patient will be admitted to the day infusion center for extended observation  Start IV D5.45 for cellular rehydration at 200/hr  Start Toradol 30 mg IV every 6 hours for inflammation.  Start Dilaudid per weight based rapid re-dosing   Patient will be re-evaluated for pain intensity in the context of function and relationship to baseline as care progresses.  If no significant pain relief, will transfer patient to inpatient services for a higher level of care.   Will check CMP, reticulocytes, and CBC w/differential   Alejandro Soto 07/14/2016, 11:46 AM

## 2016-07-14 NOTE — Progress Notes (Signed)
Pt was received to the [facility] for treatment. Pt stated his pain was 7/10 and in his back and legs. Pt was treated with Dilaudid IVP times 3 and Toradol times one. He had IV fluids at 200/hr. Pt used a heating pad to his back for pain relief. Pt's pain was 4/10 at discharge. Pt was alert,oriented and ambulatory at discharge. D/C instructions given to him with verbal understanding.

## 2016-07-20 LAB — OPIATES CONFIRMATION, URINE
CODEINE GC/MS, URINE: 370 ng/mL
CODEINE: POSITIVE — AB
HYDROMORPHONE: POSITIVE — AB
Hydrocodone: NEGATIVE
Hydromorphone gc/ms, Urine: 970 ng/mL
MORPHINE: POSITIVE — AB
Morphine, Confirm: >3000 ng/mL
Opiates: POSITIVE — AB

## 2016-07-20 LAB — DRUG SCREEN (9) + CREATININE, UR
Amphetamine Screen, Urine: NEGATIVE ng/mL
BENZODIAZ UR QL: NEGATIVE ng/mL
Barbiturate Quant, Ur: NEGATIVE ng/mL
COCAINE (METAB.), URINE: NEGATIVE ng/mL
CREATININE, RANDOM U: 88.6 mg/dL (ref 20.0–300.0)
Methadone Screen, Urine: NEGATIVE ng/mL
Oxycodone+Oxymorphone Ur Ql Scn: NEGATIVE ng/mL
PCP, Urine: NEGATIVE ng/mL
PH OF URINE: 6 (ref 4.5–8.9)
Propoxyphene: NEGATIVE ng/mL

## 2016-07-21 ENCOUNTER — Telehealth: Payer: Self-pay

## 2016-07-21 DIAGNOSIS — D5700 Hb-SS disease with crisis, unspecified: Secondary | ICD-10-CM

## 2016-07-23 MED ORDER — OXYCODONE HCL 20 MG PO TABS: 1.0000 | ORAL_TABLET | Freq: Four times a day (QID) | ORAL | 0 refills | Status: DC | PRN

## 2016-07-23 NOTE — Telephone Encounter (Signed)
Reviewed [place] Substance Reporting system prior to prescribing opiate medications. 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 Meds ordered this encounter  Medications  . Oxycodone HCl 20 MG TABS    Sig: Take 1 tablet (20 mg total) by mouth every 6 (six) hours as needed.    Dispense:  90 tablet    Refill:  0    Order Specific Question:   Supervising Provider    Answer:   Tresa Garter [id number]    Dorena Dew, FNP

## 2016-07-24 MED FILL — oxyCODONE HCL 20 MG TABS: 20 | 22 days supply | Qty: 90 | Fill #0

## 2016-07-29 ENCOUNTER — Telehealth: Payer: Self-pay

## 2016-07-29 ENCOUNTER — Other Ambulatory Visit: Payer: Self-pay | Admitting: Internal Medicine

## 2016-07-29 DIAGNOSIS — D5700 Hb-SS disease with crisis, unspecified: Secondary | ICD-10-CM

## 2016-07-29 MED ORDER — HYDROXYUREA 500 MG PO CAPS: 1500.0000 mg | ORAL_CAPSULE | Freq: Every day | ORAL | 3 refills | Status: DC

## 2016-07-29 NOTE — Telephone Encounter (Signed)
Sent. Thanks.   

## 2016-08-11 ENCOUNTER — Telehealth: Payer: Self-pay

## 2016-08-13 ENCOUNTER — Other Ambulatory Visit: Payer: Self-pay | Admitting: Internal Medicine

## 2016-08-13 DIAGNOSIS — D5700 Hb-SS disease with crisis, unspecified: Secondary | ICD-10-CM

## 2016-08-13 MED ORDER — OXYCODONE HCL 20 MG PO TABS: 1.0000 | ORAL_TABLET | Freq: Four times a day (QID) | ORAL | 0 refills | Status: DC | PRN

## 2016-08-14 ENCOUNTER — Encounter: Payer: Self-pay | Admitting: Family Medicine

## 2016-08-14 ENCOUNTER — Ambulatory Visit (INDEPENDENT_AMBULATORY_CARE_PROVIDER_SITE_OTHER): Payer: Medicare Other | Admitting: Family Medicine

## 2016-08-14 VITALS — BP 129/79 | HR 64 | Temp 98.6°F | Resp 18 | Ht 67.0 in | Wt 137.2 lb

## 2016-08-14 DIAGNOSIS — R9431 Abnormal electrocardiogram [ECG] [EKG]: Secondary | ICD-10-CM

## 2016-08-14 DIAGNOSIS — Z79899 Other long term (current) drug therapy: Secondary | ICD-10-CM | POA: Diagnosis not present

## 2016-08-14 DIAGNOSIS — D5700 Hb-SS disease with crisis, unspecified: Secondary | ICD-10-CM | POA: Diagnosis not present

## 2016-08-14 DIAGNOSIS — Z23 Encounter for immunization: Secondary | ICD-10-CM

## 2016-08-14 DIAGNOSIS — Z7964 Long term (current) use of myelosuppressive agent: Secondary | ICD-10-CM

## 2016-08-14 LAB — RETICULOCYTES
ABS Retic: 272080 cells/uL — ABNORMAL HIGH (ref 25000–90000)
RBC.: 3.58 MIL/uL — AB (ref 4.20–5.80)
Retic Ct Pct: 7.6 %

## 2016-08-14 LAB — CBC WITH DIFFERENTIAL/PLATELET
Basophils Absolute: 0 cells/uL (ref 0–200)
Basophils Relative: 0 %
Eosinophils Absolute: 250 cells/uL (ref 15–500)
Eosinophils Relative: 2 %
HEMATOCRIT: 32 % — AB (ref 38.5–50.0)
Hemoglobin: 10.9 g/dL — ABNORMAL LOW (ref 13.2–17.1)
LYMPHS PCT: 29 %
Lymphs Abs: 3625 cells/uL (ref 850–3900)
MCH: 30.4 pg (ref 27.0–33.0)
MCHC: 34.1 g/dL (ref 32.0–36.0)
MCV: 89.4 fL (ref 80.0–100.0)
MONO ABS: 1000 {cells}/uL — AB (ref 200–950)
MONOS PCT: 8 %
MPV: 8.8 fL (ref 7.5–12.5)
NEUTROS PCT: 61 %
Neutro Abs: 7625 cells/uL (ref 1500–7800)
PLATELETS: 451 10*3/uL — AB (ref 140–400)
RBC: 3.58 MIL/uL — AB (ref 4.20–5.80)
RDW: 16 % — AB (ref 11.0–15.0)
WBC: 12.5 10*3/uL — ABNORMAL HIGH (ref 3.8–10.8)

## 2016-08-14 MED ORDER — HYDROXYUREA 500 MG PO CAPS: 1500.0000 mg | ORAL_CAPSULE | Freq: Every day | ORAL | 3 refills | Status: DC

## 2016-08-14 MED ORDER — FOLIC ACID 1 MG PO TABS: 1.0000 mg | ORAL_TABLET | Freq: Every day | ORAL | 11 refills | Status: DC

## 2016-08-14 MED ORDER — OXYCODONE HCL 20 MG PO TABS: 1.0000 | ORAL_TABLET | Freq: Four times a day (QID) | ORAL | 0 refills | Status: DC | PRN

## 2016-08-14 MED FILL — HYDROXYUREA 500 MG CAPSULE: 500 | 30 days supply | Qty: 90 | Fill #0

## 2016-08-14 MED FILL — oxyCODONE HCL 20 MG TABS: 20 | 22 days supply | Qty: 90 | Fill #0

## 2016-08-14 NOTE — Progress Notes (Signed)
Subjective:    Patient ID: Alejandro Soto, male    DOB: [birth year], 31 y.o.   MRN: [id number]  HPI Alejandro Soto, a 31 year old male with a history of sickle cell anemia, HbSS presents for follow up of sickle cell anemia and chronic pain. Alejandro Soto states that he currently has pain to his lower back. Patient maintains that current pain intensity is 4/10 described as constant and aching. He last had Oxycodone 20 mg this am with moderate relief. Patient is opiate tolerant.   Patient states that he is taking all other medications consistently. Alejandro Soto denies headache, chest pains, shortness of breath, fatigue, nausea, vomiting or diarrhea.  Past Medical History:  Diagnosis Date  . Pneumonia   . Sickle cell anemia (HCC)    Social History   Social History  . Marital status: Single    Spouse name: N/A  . Number of children: N/A  . Years of education: N/A   Occupational History  . Not on file.   Social History Main Topics  . Smoking status: Current Some Day Smoker    Packs/day: 0.25    Years: 5.00    Types: Cigarettes  . Smokeless tobacco: Never Used  . Alcohol use No  . Drug use: No  . Sexual activity: Yes   Other Topics Concern  . Not on file   Social History Narrative  . No narrative on file   Immunization History  Administered Date(s) Administered  . Influenza Whole 03/21/2012  . Influenza,inj,Quad PF,36+ Mos 03/21/2013, 03/01/2014  . Pneumococcal Conjugate-13 03/21/2012  . Pneumococcal Polysaccharide-23 07/15/2014   Review of Systems  Constitutional: Negative.  Negative for fatigue and fever.  HENT: Negative.   Eyes: Negative.  Negative for visual disturbance.  Respiratory: Negative.   Cardiovascular: Negative.   Endocrine: Negative.  Negative for polydipsia, polyphagia and polyuria.  Genitourinary: Negative.   Musculoskeletal: Positive for back pain and myalgias (low extremity pain).  Skin: Negative.   Allergic/Immunologic: Negative.   Neurological:  Negative.   Hematological: Negative.   Psychiatric/Behavioral: Negative.  Negative for suicidal ideas.      Objective:   Physical Exam  Constitutional: He is oriented to person, place, and time. He appears well-developed and well-nourished.  HENT:  Head: Normocephalic and atraumatic.  Right Ear: External ear normal.  Left Ear: External ear normal.  Nose: Nose normal.  Mouth/Throat: Oropharynx is clear and moist.  Eyes: Conjunctivae and EOM are normal. Pupils are equal, round, and reactive to light.  Neck: Normal range of motion. Neck supple.  Cardiovascular: Normal rate and intact distal pulses.  An irregular rhythm present.  Murmur heard. Pulmonary/Chest: Effort normal and breath sounds normal.  Abdominal: Soft. Bowel sounds are normal.  Musculoskeletal:       Lumbar back: He exhibits decreased range of motion and pain.  Neurological: He is alert and oriented to person, place, and time. He has normal reflexes.  Skin: Skin is warm and dry.  Psychiatric: He has a normal mood and affect. His behavior is normal. Judgment and thought content normal.     BP 129/79 (BP Location: Left Arm, Patient Position: Sitting, Cuff Size: Normal)   Pulse 64   Temp 98.6 F (37 C)   Resp 18   Ht 5\' 7"  (1.702 m)   Wt 137 lb 3.2 oz (62.2 kg)   SpO2 100%   BMI 21.49 kg/m  Assessment & Plan:  1. Sickle cell anemia with pain (HCC) Continue Hydrea 1500 mg. We discussed  the need for good hydration, monitoring of hydration status, avoidance of heat, cold, stress, and infection triggers. We discussed the risks and benefits of Hydrea, including bone marrow suppression, the possibility of GI upset, skin ulcers, hair thinning, and teratogenicity. The patient was reminded of the need to seek medical attention of any symptoms of bleeding, anemia, or infection. Continue folic acid 1 mg daily to prevent aplastic bone marrow crises.    Cardiac - Routine screening for pulmonary hypertension is not recommended.  Murmur present. Reviewed EKG, will send to cardiology for further work up and evaluation.   Eye - High risk of proliferative retinopathy. Annual eye exam with retinal exam recommended to patient. Patient is requesting an opthalmologist in [place], [place], will send referral   Immunization status - Will give TDap on today   Acute and chronic painful episodes - Will continue medication at current dosage.  We discussed that pt is to receive his Schedule II prescriptions only from [place]. Pt is also aware that the prescription history is available to [place] online through the [facility] CSRS. Controlled substance agreement signed previously.  We reminded Alejandro Soto that all patients receiving Schedule II narcotics must be seen for follow within one month of prescription being requested. We reviewed the terms of our pain agreement, including the need to keep medicines in a safe locked location away from children or pets, and the need to report excess sedation or constipation, measures to avoid constipation, and policies related to early refills and stolen prescriptions. According to the [place] Chronic Pain Initiative program, we have reviewed details related to analgesia, adverse effects, aberrant behaviors.    - hydroxyurea (HYDREA) 500 MG capsule; Take 3 capsules (1,500 mg total) by mouth daily. May take with food to minimize GI side effects.  Dispense: 90 capsule; Refill: 3 - folic acid (FOLVITE) 1 MG tablet; Take 1 tablet (1 mg total) by mouth daily.  Dispense: 30 tablet; Refill: 11 - hydroxyurea (HYDREA) 500 MG capsule; Take 3 capsules (1,500 mg total) by mouth daily. May take with food to minimize GI side effects.  Dispense: 90 capsule; Refill: 3 - CBC with Differential - Reticulocytes - EKG 12-Lead  2. Sickle cell pain crisis ([place]) Reviewed [place] Substance Reporting system prior to prescribing opiate medication, no inconsistencies noted.   - Oxycodone HCl 20 MG TABS; Take 1 tablet (20 mg total) by mouth every 6 (six) hours  as needed.  Dispense: 90 tablet; Refill: 0  3. Need for Tdap vaccination  - Tdap vaccine greater than or equal to 7yo IM  4. On hydroxyurea therapy We discussed in detail that hydroxyurea has been found to be effective in reducing VOC, transfusions, and admissions for pain. It also reduces the duration of pain episodes by at least 50%. It depends on compliance. Compliance can be assessed by a rise in [facility] and drop in WBC/retic/ANC, and platelet count. It typically causes a rise in hemoglobin F, which can be assessed via hemoglobinopathy. It takes 3-6 months to see optimal effects of hydroxyurea therapy.  - CBC with Differential - Reticulocytes The patient was given clear instructions to go to ER or return to medical center if symptoms do not improve, worsen or new problems develop. The patient verbalized understanding. Will notify patient with laboratory results.  Dorena Dew, FNP

## 2016-08-25 ENCOUNTER — Telehealth: Payer: Self-pay

## 2016-08-25 NOTE — Telephone Encounter (Signed)
Called and left message advised that [facility] is not accepting new Medicaid Patients at this time and if he would please call back and let me know where he would like to be sent to now. Also the cardiology appointment will be call and scheduled as soon as our fax machine is up and we can send them the referral. I did check to see if they take medicaid and they do. Thanks!

## 2016-08-26 ENCOUNTER — Telehealth: Payer: Self-pay

## 2016-08-26 NOTE — Telephone Encounter (Signed)
I have tried to call patient, no answer. Left a message for patient to call back. If patient wants to be seen in [place] I need to know so I can fax the cariology referral to [facility]. Their phone number is [phone]. Fax number is [phone]. He is already schedule with the heart center here. I just need to know where he prefers to go. Also the [facility] [place] (where he wanted to be seen for opthalmology) does not accept his insurance. Is there anywhere different he would like to try?

## 2016-09-01 ENCOUNTER — Other Ambulatory Visit: Payer: Self-pay | Admitting: Internal Medicine

## 2016-09-01 ENCOUNTER — Telehealth: Payer: Self-pay

## 2016-09-01 DIAGNOSIS — D5700 Hb-SS disease with crisis, unspecified: Secondary | ICD-10-CM

## 2016-09-01 MED ORDER — OXYCODONE HCL 20 MG PO TABS: 1.0000 | ORAL_TABLET | Freq: Four times a day (QID) | ORAL | 0 refills | Status: DC | PRN

## 2016-09-04 MED FILL — oxyCODONE HCL 20 MG TABS: 20 | 22 days supply | Qty: 90 | Fill #0

## 2016-09-11 ENCOUNTER — Encounter: Payer: Self-pay | Admitting: Family Medicine

## 2016-09-11 ENCOUNTER — Ambulatory Visit (INDEPENDENT_AMBULATORY_CARE_PROVIDER_SITE_OTHER): Payer: Medicare Other | Admitting: Family Medicine

## 2016-09-11 VITALS — BP 123/64 | HR 78 | Temp 98.5°F | Resp 16 | Ht 67.0 in | Wt 141.0 lb

## 2016-09-11 DIAGNOSIS — R221 Localized swelling, mass and lump, neck: Secondary | ICD-10-CM | POA: Diagnosis not present

## 2016-09-11 DIAGNOSIS — D571 Sickle-cell disease without crisis: Secondary | ICD-10-CM | POA: Diagnosis not present

## 2016-09-11 DIAGNOSIS — R634 Abnormal weight loss: Secondary | ICD-10-CM

## 2016-09-11 LAB — POCT URINALYSIS DIP (DEVICE)
Bilirubin Urine: NEGATIVE
Glucose, UA: NEGATIVE mg/dL
HGB URINE DIPSTICK: NEGATIVE
KETONES UR: NEGATIVE mg/dL
Leukocytes, UA: NEGATIVE
Nitrite: NEGATIVE
PROTEIN: NEGATIVE mg/dL
SPECIFIC GRAVITY, URINE: 1.01 (ref 1.005–1.030)
UROBILINOGEN UA: 2 mg/dL — AB (ref 0.0–1.0)
pH: 6.5 (ref 5.0–8.0)

## 2016-09-11 LAB — TSH: TSH: 0.78 mIU/L (ref 0.40–4.50)

## 2016-09-11 NOTE — Progress Notes (Signed)
Subjective:    Patient ID: Alejandro Soto, male    DOB: [birth year], 31 y.o.   MRN: [id number]  HPI  [name], a 31 year old male with a history of sickle cell anemia, HbSS presents for a follow up of sickle cell anemia and medication management. [name] says that he feels well and is without complaint. He continues to have chronic pain related to sickle cell anemia. Pain intensity is 5/10 and is described as intermittent and aching. He last had Oxycodone 20 mg this am with moderate relief. He is hydrating, eating a balanced diet, and taking all other medications consistently. [name] denies headache, shortness of breath, chest pain, dysuria, nausea, vomiting, or diarrhea.  Past Medical History:  Diagnosis Date  . Pneumonia   . Sickle cell anemia (HCC)    Social History   Social History  . Marital status: Single    Spouse name: N/A  . Number of children: N/A  . Years of education: N/A   Occupational History  . Not on file.   Social History Main Topics  . Smoking status: Current Some Day Smoker    Packs/day: 0.25    Years: 5.00    Types: Cigarettes  . Smokeless tobacco: Never Used  . Alcohol use No  . Drug use: No  . Sexual activity: Yes   Other Topics Concern  . Not on file   Social History Narrative  . No narrative on file   Immunization History  Administered Date(s) Administered  . Influenza Whole 03/21/2012  . Influenza,inj,Quad PF,36+ Mos 03/21/2013, 03/01/2014  . Pneumococcal Conjugate-13 03/21/2012  . Pneumococcal Polysaccharide-23 07/15/2014  . Tdap 08/14/2016   Immunization History  Administered Date(s) Administered  . Influenza Whole 03/21/2012  . Influenza,inj,Quad PF,36+ Mos 03/21/2013, 03/01/2014  . Pneumococcal Conjugate-13 03/21/2012  . Pneumococcal Polysaccharide-23 07/15/2014  . Tdap 08/14/2016    Review of Systems  Constitutional: Negative.   HENT: Negative.   Eyes: Negative.   Respiratory: Negative.   Cardiovascular: Negative.   Negative for chest pain, palpitations and leg swelling.  Gastrointestinal: Negative.   Endocrine: Negative.   Genitourinary: Negative.   Musculoskeletal: Positive for myalgias.  Skin: Negative.   Allergic/Immunologic: Negative.   Neurological: Negative.   Hematological: Negative.   Psychiatric/Behavioral: Negative.        Objective:   Physical Exam  Constitutional: He is oriented to person, place, and time. He appears well-developed and well-nourished.  HENT:  Head: Normocephalic and atraumatic.  Right Ear: External ear normal.  Left Ear: External ear normal.  Nose: Nose normal.  Mouth/Throat: Oropharynx is clear and moist.  Eyes: Conjunctivae and EOM are normal. Pupils are equal, round, and reactive to light.  Neck: Normal range of motion. Neck supple.  0.5 cm nodule to left neck, round, raised, movable  Cardiovascular: Normal rate, regular rhythm, normal heart sounds and intact distal pulses.   Pulmonary/Chest: Effort normal and breath sounds normal.  Abdominal: Soft. Bowel sounds are normal.  Musculoskeletal: Normal range of motion.  Neurological: He is alert and oriented to person, place, and time. He has normal reflexes.  Skin: Skin is warm.  Psychiatric: He has a normal mood and affect. His behavior is normal. Judgment and thought content normal.     BP 123/64 (BP Location: Left Arm, Patient Position: Sitting, Cuff Size: Normal)   Pulse 78   Temp 98.5 F (36.9 C) (Oral)   Resp 16   Ht 5\' 7"  (1.702 m)   Wt 141 lb (64  kg)   SpO2 98%   BMI 22.08 kg/m  Assessment & Plan:  1. Sickle cell disease, type SS (HCC)  Continue Hydrea at current dosage. Reviewed labs. We discussed the need for good hydration, monitoring of hydration status, avoidance of heat, cold, stress, and infection triggers. We discussed the risks and benefits of Hydrea, including bone marrow suppression, the possibility of GI upset, skin ulcers, hair thinning, and teratogenicity. The patient was reminded  of the need to seek medical attention of any symptoms of bleeding, anemia, or infection. Continue folic acid 1 mg daily to prevent aplastic bone marrow crises.  We discussed in detail that hydroxyurea has been found to be effective in reducing VOC, transfusions, and admissions for pain. It also reduces the duration of pain episodes by at least 50%. It depends on compliance. Compliance can be assessed by a rise in [facility] and drop in WBC/retic/ANC, and platelet count. It typically causes a rise in hemoglobin F, which can be assessed via hemoglobinopathy. It takes 3-6 months to see optimal effects of hydroxyurea therapy.   Cardiac - Routine screening for pulmonary hypertension is not recommended.  Eye - High risk of proliferative retinopathy. Annual eye exam scheduled in 1 week with Dr. Baldemar Lenis   Immunization status - Immunizations up to date Acute and chronic painful episodes -Will continue opiate medications as current dosage.  We discussed that pt is to receive his Schedule II prescriptions only from [place]. Pt is also aware that the prescription history is available to [place] online through the [facility] CSRS. Controlled substance agreement signed previously. We reminded Zelphia [place] that all patients receiving Schedule II narcotics must be seen for follow within one month of prescription being requested. We reviewed the terms of our pain agreement, including the need to keep medicines in a safe locked location away from children or pets, and the need to report excess sedation or constipation, measures to avoid constipation, and policies related to early refills and stolen prescriptions. According to the [place] Chronic Pain Initiative program, we have reviewed details related to analgesia, adverse effects, aberrant behaviors.  l2. Nodule of neck 0.5 cm nodule to left neck, round, raised, movable, non tender to palpation - US Soft Tissue Head/Neck; Future  3. Weight loss  - TSH  RTC: Will follow up by phone with  ultrasound results, return in 1 month for opiate medication management   Donia Pounds  MSN, FNP-C [facility] [place], [place] [address] [phone]

## 2016-09-17 ENCOUNTER — Telehealth: Payer: Self-pay

## 2016-09-18 ENCOUNTER — Ambulatory Visit: Payer: Medicare Other | Admitting: Cardiology

## 2016-09-21 ENCOUNTER — Other Ambulatory Visit: Payer: Self-pay | Admitting: Family Medicine

## 2016-09-21 DIAGNOSIS — D5700 Hb-SS disease with crisis, unspecified: Secondary | ICD-10-CM

## 2016-09-21 MED ORDER — OXYCODONE HCL 20 MG PO TABS: 1.0000 | ORAL_TABLET | Freq: Four times a day (QID) | ORAL | 0 refills | Status: DC | PRN

## 2016-09-21 NOTE — Progress Notes (Signed)
Reviewed [place] Substance Reporting system prior to prescribing opiate medications. No inconsistencies noted.    Meds ordered this encounter  Medications  . Oxycodone HCl 20 MG TABS    Sig: Take 1 tablet (20 mg total) by mouth every 6 (six) hours as needed.    Dispense:  90 tablet    Refill:  0    Rx not to be filled prior to 09/25/2016    Order Specific Question:   Supervising Provider    Answer:   Tresa Garter [[id number]]     Donia Pounds  MSN, FNP-C [facility] [address] [place], [place] [address] [phone]

## 2016-09-22 ENCOUNTER — Ambulatory Visit (HOSPITAL_COMMUNITY): Admission: RE | Admit: 2016-09-22 | Payer: Medicare Other | Source: Ambulatory Visit

## 2016-09-25 MED FILL — oxyCODONE HCL 20 MG TABS: 20 | 22 days supply | Qty: 90 | Fill #0

## 2016-09-29 DIAGNOSIS — R0602 Shortness of breath: Secondary | ICD-10-CM | POA: Diagnosis not present

## 2016-09-29 DIAGNOSIS — D5700 Hb-SS disease with crisis, unspecified: Secondary | ICD-10-CM | POA: Diagnosis not present

## 2016-09-29 DIAGNOSIS — Z79899 Other long term (current) drug therapy: Secondary | ICD-10-CM | POA: Diagnosis not present

## 2016-09-29 DIAGNOSIS — I517 Cardiomegaly: Secondary | ICD-10-CM | POA: Diagnosis not present

## 2016-09-29 DIAGNOSIS — Z87891 Personal history of nicotine dependence: Secondary | ICD-10-CM | POA: Diagnosis not present

## 2016-09-29 DIAGNOSIS — Z880 Allergy status to penicillin: Secondary | ICD-10-CM | POA: Diagnosis not present

## 2016-10-05 ENCOUNTER — Telehealth: Payer: Self-pay

## 2016-10-06 ENCOUNTER — Other Ambulatory Visit: Payer: Self-pay | Admitting: Internal Medicine

## 2016-10-06 DIAGNOSIS — D5700 Hb-SS disease with crisis, unspecified: Secondary | ICD-10-CM

## 2016-10-06 MED ORDER — HYDROXYUREA 500 MG PO CAPS: 1500.0000 mg | ORAL_CAPSULE | Freq: Every day | ORAL | 3 refills | Status: DC

## 2016-10-06 NOTE — Telephone Encounter (Signed)
Prescription sent to the pharmarcy

## 2016-10-09 ENCOUNTER — Telehealth: Payer: Self-pay

## 2016-10-12 ENCOUNTER — Other Ambulatory Visit: Payer: Self-pay | Admitting: Family Medicine

## 2016-10-12 DIAGNOSIS — D5700 Hb-SS disease with crisis, unspecified: Secondary | ICD-10-CM

## 2016-10-12 MED ORDER — OXYCODONE HCL 20 MG PO TABS: 1.0000 | ORAL_TABLET | Freq: Four times a day (QID) | ORAL | 0 refills | Status: DC | PRN

## 2016-10-12 NOTE — Progress Notes (Signed)
Reviewed [place] Substance Reporting system prior to prescribing opiate medications. No inconsistencies noted.    Meds ordered this encounter  Medications  . Oxycodone HCl 20 MG TABS    Sig: Take 1 tablet (20 mg total) by mouth every 6 (six) hours as needed.    Dispense:  90 tablet    Refill:  0    Rx not to be filled prior to 10/17/2016    Order Specific Question:   Supervising Provider    Answer:   Tresa Garter [[id number]]     Donia Pounds  MSN, FNP-C [facility] [address] [place], [place] [address] [phone]

## 2016-10-13 ENCOUNTER — Ambulatory Visit: Payer: Medicare Other | Admitting: Cardiology

## 2016-10-13 ENCOUNTER — Encounter: Payer: Self-pay | Admitting: Family Medicine

## 2016-10-13 ENCOUNTER — Ambulatory Visit (INDEPENDENT_AMBULATORY_CARE_PROVIDER_SITE_OTHER): Payer: Medicare Other | Admitting: Family Medicine

## 2016-10-13 DIAGNOSIS — D5700 Hb-SS disease with crisis, unspecified: Secondary | ICD-10-CM

## 2016-10-13 LAB — CBC WITH DIFFERENTIAL/PLATELET
BASOS ABS: 0 {cells}/uL (ref 0–200)
BASOS PCT: 0 %
EOS PCT: 3 %
Eosinophils Absolute: 330 cells/uL (ref 15–500)
HCT: 32.2 % — ABNORMAL LOW (ref 38.5–50.0)
Hemoglobin: 10.6 g/dL — ABNORMAL LOW (ref 13.2–17.1)
LYMPHS PCT: 35 %
Lymphs Abs: 3850 cells/uL (ref 850–3900)
MCH: 31.2 pg (ref 27.0–33.0)
MCHC: 32.9 g/dL (ref 32.0–36.0)
MCV: 94.7 fL (ref 80.0–100.0)
MONOS PCT: 8 %
MPV: 9.1 fL (ref 7.5–12.5)
Monocytes Absolute: 880 cells/uL (ref 200–950)
NEUTROS ABS: 5940 {cells}/uL (ref 1500–7800)
Neutrophils Relative %: 54 %
PLATELETS: 554 10*3/uL — AB (ref 140–400)
RBC: 3.4 MIL/uL — ABNORMAL LOW (ref 4.20–5.80)
RDW: 17.9 % — AB (ref 11.0–15.0)
WBC: 11 10*3/uL — ABNORMAL HIGH (ref 3.8–10.8)

## 2016-10-13 LAB — RETICULOCYTES
ABS Retic: 289000 cells/uL — ABNORMAL HIGH (ref 25000–90000)
RBC.: 3.4 MIL/uL — ABNORMAL LOW (ref 4.20–5.80)
RETIC CT PCT: 8.5 %

## 2016-10-13 MED ORDER — HYDROXYUREA 500 MG PO CAPS: 1500.0000 mg | ORAL_CAPSULE | Freq: Every day | ORAL | 3 refills | Status: DC

## 2016-10-13 MED ORDER — L-GLUTAMINE ORAL POWDER: 10.0000 g | PACK | Freq: Two times a day (BID) | ORAL | 11 refills | Status: DC

## 2016-10-13 NOTE — Patient Instructions (Addendum)
Sickle Cell Anemia, Adult °Sickle cell anemia is a condition where your red blood cells are shaped like sickles. Red blood cells carry oxygen through the body. Sickle-shaped red blood cells do not live as long as normal red blood cells. They also clump together and block blood from flowing through the blood vessels. These things prevent the body from getting enough oxygen. Sickle cell anemia causes organ damage and pain. It also increases the risk of infection. °Follow these instructions at home: °· Drink enough fluid to keep your pee (urine) clear or pale yellow. Drink more in hot weather and during exercise. °· Do not smoke. Smoking lowers oxygen levels in the blood. °· Only take over-the-counter or prescription medicines as told by your doctor. °· Take antibiotic medicines as told by your doctor. Make sure you finish them even if you start to feel better. °· Take supplements as told by your doctor. °· Consider wearing a medical alert bracelet. This tells anyone caring for you in an emergency of your condition. °· When traveling, keep your medical information, doctors' names, and the medicines you take with you at all times. °· If you have a fever, do not take fever medicines right away. This could cover up a problem. Tell your doctor. °· Keep all follow-up visits with your doctor. Sickle cell anemia requires regular medical care. °Contact a doctor if: °You have a fever. °Get help right away if: °· You feel dizzy or faint. °· You have new belly (abdominal) pain, especially on the left side near the stomach area. °· You have a lasting, often uncomfortable and painful erection of the penis (priapism). If it is not treated right away, you will become unable to have sex (impotence). °· You have numbness in your arms or legs or you have a hard time moving them. °· You have a hard time talking. °· You have a fever or lasting symptoms for more than 2-3 days. °· You have a fever and your symptoms suddenly get  worse. °· You have signs or symptoms of infection. These include: °? Chills. °? Being more tired than normal (lethargy). °? Irritability. °? Poor eating. °? Throwing up (vomiting). °· You have pain that is not helped with medicine. °· You have shortness of breath. °· You have pain in your chest. °· You are coughing up pus-like or bloody mucus. °· You have a stiff neck. °· Your feet or hands swell or have pain. °· Your belly looks bloated. °· Your joints hurt. °This information is not intended to replace advice given to you by your health care provider. Make sure you discuss any questions you have with your health care provider. °Document Released: 03/29/2013 Document Revised: 11/14/2015 Document Reviewed: 01/18/2013 °Elsevier Interactive Patient Education © 2017 Elsevier Inc. ° °

## 2016-10-13 NOTE — Progress Notes (Signed)
Subjective:    Patient ID: Alejandro Soto, male    DOB: 1986-05-01, 31 y.o.   MRN: [id number]  Medication Refill  Associated symptoms include myalgias. Pertinent negatives include no chest pain.    [name], a 31 year old male with a history of sickle cell anemia, HbSS presents for a follow up of sickle cell anemia and medication management. [name] says that he feels well and is without complaint. He continues to have chronic pain related to sickle cell anemia. Pain intensity is 6/10 and is described as intermittent and aching. He last had Oxycodone 20 mg this am with sustained relief. He is hydrating, eating a balanced diet, and taking all other medications consistently. [name] denies headache, shortness of breath, chest pain, dysuria, nausea, vomiting, or diarrhea.  Past Medical History:  Diagnosis Date  . Pneumonia   . Sickle cell anemia (HCC)    Social History   Social History  . Marital status: Single    Spouse name: N/A  . Number of children: N/A  . Years of education: N/A   Occupational History  . Not on file.   Social History Main Topics  . Smoking status: Current Some Day Smoker    Packs/day: 0.25    Years: 5.00    Types: Cigarettes  . Smokeless tobacco: Never Used  . Alcohol use No  . Drug use: No  . Sexual activity: Yes   Other Topics Concern  . Not on file   Social History Narrative  . No narrative on file   Immunization History  Administered Date(s) Administered  . Influenza Whole 03/21/2012  . Influenza,inj,Quad PF,36+ Mos 03/21/2013, 03/01/2014  . Pneumococcal Conjugate-13 03/21/2012  . Pneumococcal Polysaccharide-23 07/15/2014  . Tdap 08/14/2016   Immunization History  Administered Date(s) Administered  . Influenza Whole 03/21/2012  . Influenza,inj,Quad PF,36+ Mos 03/21/2013, 03/01/2014  . Pneumococcal Conjugate-13 03/21/2012  . Pneumococcal Polysaccharide-23 07/15/2014  . Tdap 08/14/2016    Review of Systems  Constitutional:  Negative.   HENT: Negative.   Eyes: Negative.   Respiratory: Negative.   Cardiovascular: Negative.  Negative for chest pain, palpitations and leg swelling.  Gastrointestinal: Negative.   Endocrine: Negative.   Genitourinary: Negative.   Musculoskeletal: Positive for myalgias.  Skin: Negative.   Allergic/Immunologic: Negative.   Neurological: Negative.   Hematological: Negative.   Psychiatric/Behavioral: Negative.        Objective:   Physical Exam  Constitutional: He is oriented to person, place, and time. He appears well-developed and well-nourished.  HENT:  Head: Normocephalic and atraumatic.  Right Ear: External ear normal.  Left Ear: External ear normal.  Nose: Nose normal.  Mouth/Throat: Oropharynx is clear and moist.  Eyes: Conjunctivae and EOM are normal. Pupils are equal, round, and reactive to light.  Neck: Normal range of motion. Neck supple.  0.5 cm nodule to left neck, round, raised, movable  Cardiovascular: Normal rate, regular rhythm, normal heart sounds and intact distal pulses.   Pulmonary/Chest: Effort normal and breath sounds normal.  Abdominal: Soft. Bowel sounds are normal.  Musculoskeletal: Normal range of motion.  Neurological: He is alert and oriented to person, place, and time. He has normal reflexes.  Skin: Skin is warm.  Psychiatric: He has a normal mood and affect. His behavior is normal. Judgment and thought content normal.     BP 112/83 (BP Location: Left Arm, Patient Position: Sitting, Cuff Size: Normal)   Pulse 66   Temp 98.9 F (37.2 C) (Oral)   Resp  16   Ht 5\' 7"  (1.702 m)   Wt 140 lb (63.5 kg)   SpO2 96%   BMI 21.93 kg/m  Assessment & Plan:  1. Sickle cell disease, type SS (HCC)  Continue Hydrea at current dosage. Reviewed labs. We discussed the need for good hydration, monitoring of hydration status, avoidance of heat, cold, stress, and infection triggers. We discussed the risks and benefits of Hydrea, including bone marrow  suppression, the possibility of GI upset, skin ulcers, hair thinning, and teratogenicity. The patient was reminded of the need to seek medical attention of any symptoms of bleeding, anemia, or infection. Continue folic acid 1 mg daily to prevent aplastic bone marrow crises.   Will start a trial of Endari, medication discussed at length.  The findings showed a 25% reduction in the frequency of sickle-cell crises, a 33% decrease in hospitalisation rates, fewer hospital visits due to sickle-cell pain and 60% fewer occurrences of acute chest syndrome in patients administered with Endari.   We discussed in detail that hydroxyurea has been found to be effective in reducing VOC, transfusions, and admissions for pain. It also reduces the duration of pain episodes by at least 50%. It depends on compliance. Compliance can be assessed by a rise in [facility] and drop in WBC/retic/ANC, and platelet count. It typically causes a rise in hemoglobin F, which can be assessed via hemoglobinopathy. It takes 3-6 months to see optimal effects of hydroxyurea therapy.   Cardiac - Routine screening for pulmonary hypertension is not recommended.  Eye - High risk of proliferative retinopathy. Annual eye exam scheduled in 1 week with Dr. Baldemar Lenis   Immunization status - Immunizations up to date Acute and chronic painful episodes -Will continue opiate medications as current dosage.  We discussed that pt is to receive his Schedule II prescriptions only from [place]. Pt is also aware that the prescription history is available to [place] online through the [facility] CSRS. Controlled substance agreement signed previously. We reminded Zelphia Cairo that all patients receiving Schedule II narcotics must be seen for follow within one month of prescription being requested. We reviewed the terms of our pain agreement, including the need to keep medicines in a safe locked location away from children or pets, and the need to report excess sedation or constipation,  measures to avoid constipation, and policies related to early refills and stolen prescriptions. According to the [place] Chronic Pain Initiative program, we have reviewed details related to analgesia, adverse effects, aberrant behaviors. Reviewed [place] Substance Reporting system prior to prescribing opiate medications. No inconsistencies noted.  - hydroxyurea (HYDREA) 500 MG capsule; Take 3 capsules (1,500 mg total) by mouth daily. May take with food to minimize GI side effects.  Dispense: 270 capsule; Refill: 3 - CBC with Differential - Reticulocytes - L-glutamine (ENDARI) 5 g PACK Powder Packet; Take 10 g by mouth 2 (two) times daily.  Dispense: 60 packet; Refill: 11 - Pain Mgmt, Profile 8 w/Conf, U   RTC: Will follow up by phone with ultrasound results, return in 2 months for opiate medication management   Donia Pounds  MSN, FNP-C [facility] [place], [place] [address] [phone]

## 2016-10-13 NOTE — Progress Notes (Deleted)
     Cardiology Consultation:   Patient ID: Bascom Biel; [id number]; Nov 06, 1985   Admit date: (Not on file) Date of Consult: 10/13/2016  Primary Care Provider: Dorena Dew, FNP    Patient Profile:   Jaray Boliver is a 31 y.o. male with a hx of *** who is being seen today for the evaluation of *** at the request of ***.  History of Present Illness:   [name] ***  Past Medical History:  Diagnosis Date  . Pneumonia   . Sickle cell anemia (HCC)     Past Surgical History:  Procedure Laterality Date  . NO PAST SURGERIES       Inpatient Medications: Scheduled Meds:  Continuous Infusions:  PRN Meds:   Allergies:    Allergies  Allergen Reactions  . Amoxicillin Diarrhea    Social History:   Social History   Social History  . Marital status: Single    Spouse name: N/A  . Number of children: N/A  . Years of education: N/A   Occupational History  . Not on file.   Social History Main Topics  . Smoking status: Current Some Day Smoker    Packs/day: 0.25    Years: 5.00    Types: Cigarettes  . Smokeless tobacco: Never Used  . Alcohol use No  . Drug use: No  . Sexual activity: Yes   Other Topics Concern  . Not on file   Social History Narrative  . No narrative on file    Family History:   The patient's family history includes Asthma in his brother; Cancer in his maternal grandmother; Cancer (age of onset: 51) in his father; Diabetes in his father.  ROS:  Please see the history of present illness.  ***All other ROS reviewed and negative.     Physical Exam/Data:  There were no vitals filed for this visit. @IOBRIEF @ There were no vitals filed for this visit. There is no height or weight on file to calculate BMI.  General:  Well nourished, well developed, in no acute distress*** HEENT: normal Lymph: no adenopathy Neck: no JVD Endocrine:  No thryomegaly Vascular: No carotid bruits; FA pulses 2+ bilaterally without bruits  Cardiac:  normal  S1, S2; RRR; no murmur *** Lungs:  clear to auscultation bilaterally, no wheezing, rhonchi or rales  Abd: soft, nontender, no hepatomegaly  Ext: no edema Musculoskeletal:  No deformities, BUE and BLE strength normal and equal Skin: warm and dry  Neuro:  CNs 2-12 intact, no focal abnormalities noted Psych:  Normal affect    EKG:  The EKG was personally reviewed and demonstrates ***  Relevant CV Studies: ***  Laboratory Data:  ChemistryNo results for input(s): NA, K, CL, CO2, GLUCOSE, BUN, CREATININE, CALCIUM, GFRNONAA, GFRAA, ANIONGAP in the last 168 hours.  No results for input(s): PROT, ALBUMIN, AST, ALT, ALKPHOS, BILITOT in the last 168 hours. HematologyNo results for input(s): WBC, RBC, HGB, HCT, MCV, MCH, MCHC, RDW, PLT in the last 168 hours. Cardiac EnzymesNo results for input(s): TROPONINI in the last 168 hours. No results for input(s): TROPIPOC in the last 168 hours.  BNPNo results for input(s): BNP, PROBNP in the last 168 hours.  DDimer No results for input(s): DDIMER in the last 168 hours.  Radiology/Studies:  No results found.  Assessment and Plan:   1. ***   Signed, Candee Furbish, MD  10/13/2016 8:43 AM

## 2016-10-16 ENCOUNTER — Encounter: Payer: Self-pay | Admitting: Cardiology

## 2016-10-19 LAB — PAIN MGMT, PROFILE 8 W/CONF, U
6 Acetylmorphine: 42 ng/mL — ABNORMAL HIGH (ref <10)
6 Acetylmorphine: POSITIVE ng/mL — AB (ref <10)
AMPHETAMINES: NEGATIVE ng/mL (ref <500)
Alcohol Metabolites: NEGATIVE ng/mL (ref <500)
BUPRENORPHINE: NEGATIVE ng/mL (ref <5)
Benzodiazepines: NEGATIVE ng/mL (ref <100)
CREATININE: 46.2 mg/dL (ref >=20.0)
Cocaine Metabolite: NEGATIVE ng/mL (ref <150)
Codeine: 481 ng/mL — ABNORMAL HIGH (ref <50)
HYDROCODONE: NEGATIVE ng/mL (ref <50)
Hydromorphone: 170 ng/mL — ABNORMAL HIGH (ref <50)
MDMA: NEGATIVE ng/mL (ref <500)
Marijuana Metabolite: 17 ng/mL — ABNORMAL HIGH (ref <5)
Marijuana Metabolite: POSITIVE ng/mL — AB (ref <20)
Morphine: 27609 ng/mL — ABNORMAL HIGH (ref <50)
NORHYDROCODONE: NEGATIVE ng/mL (ref <50)
OPIATES: POSITIVE ng/mL — AB (ref <100)
OXIDANT: NEGATIVE ug/mL (ref <200)
Oxycodone: NEGATIVE ng/mL (ref <100)
PH: 7.12 (ref 4.5–9.0)
Please note:: 0

## 2016-11-02 ENCOUNTER — Telehealth: Payer: Self-pay

## 2016-11-03 ENCOUNTER — Other Ambulatory Visit: Payer: Self-pay | Admitting: Internal Medicine

## 2016-11-03 DIAGNOSIS — D5700 Hb-SS disease with crisis, unspecified: Secondary | ICD-10-CM

## 2016-11-03 MED ORDER — OXYCODONE HCL 20 MG PO TABS: 1.0000 | ORAL_TABLET | Freq: Four times a day (QID) | ORAL | 0 refills | Status: DC | PRN

## 2016-11-03 NOTE — Telephone Encounter (Signed)
Refilled

## 2016-11-06 MED FILL — oxyCODONE HCL 20 MG TABS: 20 | 22 days supply | Qty: 90 | Fill #0

## 2016-11-24 ENCOUNTER — Telehealth: Payer: Self-pay

## 2016-11-24 ENCOUNTER — Other Ambulatory Visit: Payer: Self-pay | Admitting: Internal Medicine

## 2016-11-24 DIAGNOSIS — D5700 Hb-SS disease with crisis, unspecified: Secondary | ICD-10-CM

## 2016-11-24 MED ORDER — OXYCODONE HCL 20 MG PO TABS: 1.0000 | ORAL_TABLET | Freq: Four times a day (QID) | ORAL | 0 refills | Status: DC | PRN

## 2016-11-27 MED FILL — oxyCODONE HCL 20 MG TABS: 20 | 22 days supply | Qty: 90 | Fill #0

## 2016-12-14 ENCOUNTER — Telehealth: Payer: Self-pay

## 2016-12-15 ENCOUNTER — Other Ambulatory Visit: Payer: Self-pay | Admitting: Internal Medicine

## 2016-12-15 DIAGNOSIS — D5700 Hb-SS disease with crisis, unspecified: Secondary | ICD-10-CM

## 2016-12-15 MED ORDER — OXYCODONE HCL 20 MG PO TABS: 1.0000 | ORAL_TABLET | Freq: Four times a day (QID) | ORAL | 0 refills | Status: DC | PRN

## 2016-12-16 ENCOUNTER — Other Ambulatory Visit: Payer: Self-pay

## 2016-12-16 ENCOUNTER — Ambulatory Visit (INDEPENDENT_AMBULATORY_CARE_PROVIDER_SITE_OTHER): Payer: Medicare Other | Admitting: Family Medicine

## 2016-12-16 ENCOUNTER — Encounter: Payer: Self-pay | Admitting: Family Medicine

## 2016-12-16 VITALS — BP 90/55 | HR 66 | Temp 98.5°F | Resp 14 | Ht 67.0 in | Wt 135.0 lb

## 2016-12-16 DIAGNOSIS — M792 Neuralgia and neuritis, unspecified: Secondary | ICD-10-CM | POA: Diagnosis not present

## 2016-12-16 DIAGNOSIS — Z79891 Long term (current) use of opiate analgesic: Secondary | ICD-10-CM

## 2016-12-16 DIAGNOSIS — D5700 Hb-SS disease with crisis, unspecified: Secondary | ICD-10-CM

## 2016-12-16 DIAGNOSIS — D571 Sickle-cell disease without crisis: Secondary | ICD-10-CM | POA: Diagnosis not present

## 2016-12-16 LAB — POCT URINALYSIS DIP (DEVICE)
BILIRUBIN URINE: NEGATIVE
GLUCOSE, UA: NEGATIVE mg/dL
HGB URINE DIPSTICK: NEGATIVE
KETONES UR: NEGATIVE mg/dL
LEUKOCYTES UA: NEGATIVE
Nitrite: NEGATIVE
Protein, ur: NEGATIVE mg/dL
SPECIFIC GRAVITY, URINE: 1.015 (ref 1.005–1.030)
Urobilinogen, UA: 1 mg/dL (ref 0.0–1.0)
pH: 7 (ref 5.0–8.0)

## 2016-12-16 LAB — CBC WITH DIFFERENTIAL/PLATELET
BASOS ABS: 0 {cells}/uL (ref 0–200)
Basophils Relative: 0 %
EOS ABS: 160 {cells}/uL (ref 15–500)
EOS PCT: 2 %
HCT: 31.5 % — ABNORMAL LOW (ref 38.5–50.0)
Hemoglobin: 10.8 g/dL — ABNORMAL LOW (ref 13.2–17.1)
LYMPHS PCT: 30 %
Lymphs Abs: 2400 cells/uL (ref 850–3900)
MCH: 32.2 pg (ref 27.0–33.0)
MCHC: 34.3 g/dL (ref 32.0–36.0)
MCV: 94 fL (ref 80.0–100.0)
MONOS PCT: 7 %
MPV: 8.8 fL (ref 7.5–12.5)
Monocytes Absolute: 560 cells/uL (ref 200–950)
Neutro Abs: 4880 cells/uL (ref 1500–7800)
Neutrophils Relative %: 61 %
PLATELETS: 706 10*3/uL — AB (ref 140–400)
RBC: 3.35 MIL/uL — ABNORMAL LOW (ref 4.20–5.80)
RDW: 16.6 % — AB (ref 11.0–15.0)
WBC: 8 10*3/uL (ref 3.8–10.8)

## 2016-12-16 MED ORDER — GABAPENTIN 300 MG PO CAPS: 300.0000 mg | ORAL_CAPSULE | Freq: Two times a day (BID) | ORAL | 2 refills | Status: DC

## 2016-12-16 MED ORDER — L-GLUTAMINE ORAL POWDER: 10.0000 g | PACK | Freq: Two times a day (BID) | ORAL | 11 refills | Status: DC

## 2016-12-16 NOTE — Progress Notes (Signed)
Subjective:    Patient ID: Alejandro Soto, male    DOB: [birth year], 31 y.o.   MRN: [id number]  HPI  [name], a 31 year old male with a history of sickle cell anemia, HbSS presents for a 2 month follow up of sickle cell anemia and medication management. [name] says that he feels well and is without complaint. He continues to have chronic pain related to sickle cell anemia. Pain intensity is 5/10 and is described as intermittent and aching. Pain is primarily to lower abdomen.  He last had Oxycodone 20 mg this am with sustained relief. He is hydrating, eating a balanced diet, and taking all other medications consistently. [name] denies headache, shortness of breath, chest pain, dysuria, nausea, vomiting, or diarrhea.  Past Medical History:  Diagnosis Date  . Pneumonia   . Sickle cell anemia (HCC)    Social History   Social History  . Marital status: Single    Spouse name: N/A  . Number of children: N/A  . Years of education: N/A   Occupational History  . Not on file.   Social History Main Topics  . Smoking status: Current Some Day Smoker    Packs/day: 0.25    Years: 5.00    Types: Cigarettes  . Smokeless tobacco: Never Used  . Alcohol use No  . Drug use: No  . Sexual activity: Yes   Other Topics Concern  . Not on file   Social History Narrative  . No narrative on file   Immunization History  Administered Date(s) Administered  . Influenza Whole 03/21/2012  . Influenza,inj,Quad PF,36+ Mos 03/21/2013, 03/01/2014  . Pneumococcal Conjugate-13 03/21/2012  . Pneumococcal Polysaccharide-23 07/15/2014  . Tdap 08/14/2016   Immunization History  Administered Date(s) Administered  . Influenza Whole 03/21/2012  . Influenza,inj,Quad PF,36+ Mos 03/21/2013, 03/01/2014  . Pneumococcal Conjugate-13 03/21/2012  . Pneumococcal Polysaccharide-23 07/15/2014  . Tdap 08/14/2016    Review of Systems  Constitutional: Negative.   HENT: Negative.   Eyes: Negative.    Respiratory: Negative.   Cardiovascular: Negative.  Negative for chest pain, palpitations and leg swelling.  Gastrointestinal: Positive for abdominal pain. Negative for blood in stool, constipation, diarrhea and vomiting.  Endocrine: Negative.   Genitourinary: Negative.   Musculoskeletal: Positive for myalgias.  Skin: Negative.   Allergic/Immunologic: Negative.   Neurological: Negative.   Hematological: Negative.   Psychiatric/Behavioral: Negative.        Objective:   Physical Exam  Constitutional: He is oriented to person, place, and time. He appears well-developed and well-nourished.  HENT:  Head: Normocephalic and atraumatic.  Right Ear: External ear normal.  Left Ear: External ear normal.  Nose: Nose normal.  Mouth/Throat: Oropharynx is clear and moist.  Eyes: Conjunctivae and EOM are normal. Pupils are equal, round, and reactive to light.  Neck: Normal range of motion. Neck supple.  Cardiovascular: Normal rate, regular rhythm, normal heart sounds and intact distal pulses.   Pulmonary/Chest: Effort normal and breath sounds normal.  Abdominal: Soft. Bowel sounds are normal. There is generalized tenderness.  Musculoskeletal: Normal range of motion.  Neurological: He is alert and oriented to person, place, and time. He has normal reflexes.  Skin: Skin is warm.  Psychiatric: He has a normal mood and affect. His behavior is normal. Judgment and thought content normal.     BP (!) 90/55 (BP Location: Left Arm, Patient Position: Sitting, Cuff Size: Normal)   Pulse 66   Temp 98.5 F (36.9 C) (Oral)  Resp 14   Ht 5\' 7"  (1.702 m)   Wt 135 lb (61.2 kg)   SpO2 98%   BMI 21.14 kg/m  Assessment & Plan:  1. Sickle cell disease, type SS (HCC)  Continue Hydrea at current dosage. Reviewed labs. We discussed the need for good hydration, monitoring of hydration status, avoidance of heat, cold, stress, and infection triggers. We discussed the risks and benefits of Hydrea, including bone  marrow suppression, the possibility of GI upset, skin ulcers, hair thinning, and teratogenicity. The patient was reminded of the need to seek medical attention of any symptoms of bleeding, anemia, or infection. Continue folic acid 1 mg daily to prevent aplastic bone marrow crises.   Will start a trial of Endari, medication discussed at length.  The findings showed a 25% reduction in the frequency of sickle-cell crises, a 33% decrease in hospitalisation rates, fewer hospital visits due to sickle-cell pain and 60% fewer occurrences of acute chest syndrome in patients administered with Endari.   We discussed in detail that hydroxyurea has been found to be effective in reducing VOC, transfusions, and admissions for pain. It also reduces the duration of pain episodes by at least 50%. It depends on compliance. Compliance can be assessed by a rise in Alejandro Soto and drop in WBC/retic/ANC, and platelet count. It typically causes a rise in hemoglobin F, which can be assessed via hemoglobinopathy. It takes 3-6 months to see optimal effects of hydroxyurea therapy.   Cardiac - Routine screening for pulmonary hypertension is not recommended.  Eye - High risk of proliferative retinopathy. Annual eye exam scheduled in 1 week with Alejandro Soto   Immunization status - Immunizations up to date Acute and chronic painful episodes -Will continue opiate medications as current dosage.  We discussed that pt is to receive his Schedule II prescriptions only from [place]. Pt is also aware that the prescription history is available to [place] online through the [facility] CSRS. Controlled substance agreement signed previously. We reminded Alejandro Soto that all patients receiving Schedule II narcotics must be seen for follow within one month of prescription being requested. We reviewed the terms of our pain agreement, including the need to keep medicines in a safe locked location away from children or pets, and the need to report excess sedation or  constipation, measures to avoid constipation, and policies related to early refills and stolen prescriptions. According to the [place] Chronic Pain Initiative program, we have reviewed details related to analgesia, adverse effects, aberrant behaviors. Reviewed [place] Substance Reporting system prior to prescribing opiate medications. No inconsistencies noted.   - COMPLETE METABOLIC PANEL WITH GFR - CBC with Differential - POCT urinalysis dip (device)  2. Chronic prescription opiate use - Pain Mgmt, Profile 8 w/Conf, U     Opioid Risk Tool - 12/16/16 1622      Family History of Substance Abuse   Alcohol Negative   Illegal Drugs Negative   Rx Drugs Negative     Personal History of Substance Abuse   Alcohol Negative   Illegal Drugs Negative   Rx Drugs Negative     Age   Age between 43-45 years  Yes     Psychological Disease   Psychological Disease Negative   Depression Negative     Total Score   Opioid Risk Tool Scoring 1   Opioid Risk Interpretation Low Risk     3. Neuropathic pain - gabapentin (NEURONTIN) 300 MG capsule; Take 1 capsule (300 mg total) by mouth 2 (two) times daily.  Dispense: 60 capsule; Refill: 2    RTC: 2 months for sickle cell anemia   Donia Pounds  MSN, FNP-C [facility] [address] [place], [place] [phone]

## 2016-12-16 NOTE — Patient Instructions (Addendum)
Sickle Cell Anemia, Adult  Will start Endari as previously discussed. The findings showed a 25% reduction in the frequency of sickle-cell crises, a 33% decrease in hospitalisation rates, fewer hospital visits due to sickle-cell pain and 60% fewer occurrences of acute chest syndrome in patients administered with Endari.   Continue hydroxyurea as prescribed. We discussed in detail that hydroxyurea has been found to be effective in reducing VOC, transfusions, and admissions for pain. It also reduces the duration of pain episodes by at least 50%. It depends on compliance. Compliance can be assessed by a rise in [facility] and drop in WBC/retic/ANC, and platelet count. It typically causes a rise in hemoglobin F, which can be assessed via hemoglobinopathy. It takes 3-6 months to see optimal effects of hydroxyurea therapy.      Sickle cell anemia is a condition where your red blood cells are shaped like sickles. Red blood cells carry oxygen through the body. Sickle-shaped red blood cells do not live as long as normal red blood cells. They also clump together and block blood from flowing through the blood vessels. These things prevent the body from getting enough oxygen. Sickle cell anemia causes organ damage and pain. It also increases the risk of infection. Follow these instructions at home:  Drink enough fluid to keep your pee (urine) clear or pale yellow. Drink more in hot weather and during exercise.  Do not smoke. Smoking lowers oxygen levels in the blood.  Only take over-the-counter or prescription medicines as told by your doctor.  Take antibiotic medicines as told by your doctor. Make sure you finish them even if you start to feel better.  Take supplements as told by your doctor.  Consider wearing a medical alert bracelet. This tells anyone caring for you in an emergency of your condition.  When traveling, keep your medical information, doctors' names, and the medicines you take with you at  all times.  If you have a fever, do not take fever medicines right away. This could cover up a problem. Tell your doctor.  Keep all follow-up visits with your doctor. Sickle cell anemia requires regular medical care. Contact a doctor if: You have a fever. Get help right away if:  You feel dizzy or faint.  You have new belly (abdominal) pain, especially on the left side near the stomach area.  You have a lasting, often uncomfortable and painful erection of the penis (priapism). If it is not treated right away, you will become unable to have sex (impotence).  You have numbness in your arms or legs or you have a hard time moving them.  You have a hard time talking.  You have a fever or lasting symptoms for more than 2-3 days.  You have a fever and your symptoms suddenly get worse.  You have signs or symptoms of infection. These include: ? Chills. ? Being more tired than normal (lethargy). ? Irritability. ? Poor eating. ? Throwing up (vomiting).  You have pain that is not helped with medicine.  You have shortness of breath.  You have pain in your chest.  You are coughing up pus-like or bloody mucus.  You have a stiff neck.  Your feet or hands swell or have pain.  Your belly looks bloated.  Your joints hurt. This information is not intended to replace advice given to you by your health care provider. Make sure you discuss any questions you have with your health care provider. Document Released: 03/29/2013 Document Revised: 11/14/2015 Document Reviewed:  01/18/2013 Elsevier Interactive Patient Education  2017 [employer].

## 2016-12-16 NOTE — Telephone Encounter (Signed)
Refill for endari sent into pharmacy. Thanks!

## 2016-12-17 LAB — COMPLETE METABOLIC PANEL WITH GFR
ALBUMIN: 4.5 g/dL (ref 3.6–5.1)
ALK PHOS: 42 U/L (ref 40–115)
ALT: 14 U/L (ref 9–46)
AST: 23 U/L (ref 10–40)
BILIRUBIN TOTAL: 0.7 mg/dL (ref 0.2–1.2)
BUN: 8 mg/dL (ref 7–25)
CO2: 24 mmol/L (ref 20–31)
CREATININE: 0.72 mg/dL (ref 0.60–1.35)
Calcium: 9.2 mg/dL (ref 8.6–10.3)
Chloride: 108 mmol/L (ref 98–110)
GFR, Est Non African American: >89 mL/min (ref >=60)
GLUCOSE: 68 mg/dL (ref 65–99)
Potassium: 4.2 mmol/L (ref 3.5–5.3)
SODIUM: 142 mmol/L (ref 135–146)
TOTAL PROTEIN: 7.3 g/dL (ref 6.1–8.1)

## 2016-12-27 LAB — PAIN MGMT, PROFILE 8 W/CONF, U
6 ACETYLMORPHINE: POSITIVE ng/mL — AB (ref <10)
6 Acetylmorphine: 114 ng/mL — ABNORMAL HIGH (ref <10)
ALCOHOL METABOLITES: NEGATIVE ng/mL (ref <500)
AMPHETAMINES: NEGATIVE ng/mL (ref <500)
BUPRENORPHINE: NEGATIVE ng/mL (ref <5)
Benzodiazepines: NEGATIVE ng/mL (ref <100)
CODEINE: 1139 ng/mL — AB (ref <50)
Cocaine Metabolite: NEGATIVE ng/mL (ref <150)
Creatinine: 118.6 mg/dL (ref >=20.0)
HYDROCODONE: NEGATIVE ng/mL (ref <50)
Hydromorphone: 385 ng/mL — ABNORMAL HIGH (ref <50)
MARIJUANA METABOLITE: 84 ng/mL — AB (ref <5)
MDMA: NEGATIVE ng/mL (ref <500)
Marijuana Metabolite: POSITIVE ng/mL — AB (ref <20)
Morphine: 44917 ng/mL — ABNORMAL HIGH (ref <50)
NORHYDROCODONE: NEGATIVE ng/mL (ref <50)
Noroxycodone: 57 ng/mL — ABNORMAL HIGH (ref <50)
OPIATES: POSITIVE ng/mL — AB (ref <100)
OXIDANT: NEGATIVE ug/mL (ref <200)
OXYMORPHONE: 214 ng/mL — AB (ref <50)
Oxycodone: NEGATIVE ng/mL (ref <50)
Oxycodone: POSITIVE ng/mL — AB (ref <100)
PH: 5.8 (ref 4.5–9.0)
PLEASE NOTE: 0

## 2017-01-01 ENCOUNTER — Other Ambulatory Visit: Payer: Self-pay | Admitting: Internal Medicine

## 2017-01-01 ENCOUNTER — Telehealth: Payer: Self-pay

## 2017-01-01 DIAGNOSIS — D5700 Hb-SS disease with crisis, unspecified: Secondary | ICD-10-CM

## 2017-01-01 MED ORDER — OXYCODONE HCL 20 MG PO TABS: 1.0000 | ORAL_TABLET | Freq: Four times a day (QID) | ORAL | 0 refills | Status: DC | PRN

## 2017-01-05 MED FILL — oxyCODONE HCL 20 MG TABS: 20 | 22 days supply | Qty: 90 | Fill #0

## 2017-01-21 ENCOUNTER — Telehealth: Payer: Self-pay

## 2017-01-22 ENCOUNTER — Other Ambulatory Visit: Payer: Self-pay | Admitting: Family Medicine

## 2017-01-22 DIAGNOSIS — D5700 Hb-SS disease with crisis, unspecified: Secondary | ICD-10-CM

## 2017-01-22 MED ORDER — OXYCODONE HCL 20 MG PO TABS: 1.0000 | ORAL_TABLET | Freq: Four times a day (QID) | ORAL | 0 refills | Status: DC | PRN

## 2017-01-22 NOTE — Progress Notes (Signed)
Reviewed [place] Substance Reporting system prior to prescribing opiate medications. No inconsistencies noted.   Meds ordered this encounter  Medications  . Oxycodone HCl 20 MG TABS    Sig: Take 1 tablet (20 mg total) by mouth every 6 (six) hours as needed.    Dispense:  90 tablet    Refill:  0    Rx not to be filled prior to 01/28/2017    Order Specific Question:   Supervising Provider    Answer:   Tresa Garter [[id number]]     Donia Pounds  MSN, FNP-C [facility] [address] [place], [place] [address] [phone]

## 2017-01-26 MED FILL — oxyCODONE HCL 20 MG TABS: 20 | 23 days supply | Qty: 90 | Fill #0

## 2017-02-16 ENCOUNTER — Ambulatory Visit (INDEPENDENT_AMBULATORY_CARE_PROVIDER_SITE_OTHER): Payer: Medicare Other | Admitting: Family Medicine

## 2017-02-16 ENCOUNTER — Encounter: Payer: Self-pay | Admitting: Family Medicine

## 2017-02-16 VITALS — BP 119/68 | HR 81 | Temp 98.1°F | Resp 14 | Ht 67.0 in | Wt 149.0 lb

## 2017-02-16 DIAGNOSIS — G8929 Other chronic pain: Secondary | ICD-10-CM | POA: Diagnosis not present

## 2017-02-16 DIAGNOSIS — D571 Sickle-cell disease without crisis: Secondary | ICD-10-CM | POA: Diagnosis not present

## 2017-02-16 LAB — POCT URINALYSIS DIP (DEVICE)
Bilirubin Urine: NEGATIVE
Glucose, UA: NEGATIVE mg/dL
HGB URINE DIPSTICK: NEGATIVE
Ketones, ur: NEGATIVE mg/dL
Leukocytes, UA: NEGATIVE
NITRITE: NEGATIVE
PH: 7 (ref 5.0–8.0)
PROTEIN: NEGATIVE mg/dL
SPECIFIC GRAVITY, URINE: 1.01 (ref 1.005–1.030)
UROBILINOGEN UA: 0.2 mg/dL (ref 0.0–1.0)

## 2017-02-16 LAB — CBC WITH DIFFERENTIAL/PLATELET
BASOS PCT: 0 %
Basophils Absolute: 0 cells/uL (ref 0–200)
EOS PCT: 4 %
Eosinophils Absolute: 484 cells/uL (ref 15–500)
HCT: 30 % — ABNORMAL LOW (ref 38.5–50.0)
Hemoglobin: 10.2 g/dL — ABNORMAL LOW (ref 13.2–17.1)
LYMPHS PCT: 38 %
Lymphs Abs: 4598 cells/uL — ABNORMAL HIGH (ref 850–3900)
MCH: 30.6 pg (ref 27.0–33.0)
MCHC: 34 g/dL (ref 32.0–36.0)
MCV: 90.1 fL (ref 80.0–100.0)
MONOS PCT: 8 %
MPV: 8.8 fL (ref 7.5–12.5)
Monocytes Absolute: 968 cells/uL — ABNORMAL HIGH (ref 200–950)
NEUTROS ABS: 6050 {cells}/uL (ref 1500–7800)
Neutrophils Relative %: 50 %
PLATELETS: 677 10*3/uL — AB (ref 140–400)
RBC: 3.33 MIL/uL — ABNORMAL LOW (ref 4.20–5.80)
RDW: 15.9 % — AB (ref 11.0–15.0)
WBC: 12.1 10*3/uL — AB (ref 3.8–10.8)

## 2017-02-16 LAB — RETICULOCYTES
ABS RETIC: 259740 {cells}/uL — AB (ref 25000–90000)
RBC.: 3.33 MIL/uL — ABNORMAL LOW (ref 4.20–5.80)
RETIC CT PCT: 7.8 %

## 2017-02-16 MED ORDER — OXYCODONE HCL 20 MG PO TABS
1.0000 | ORAL_TABLET | Freq: Four times a day (QID) | ORAL | 0 refills | Status: DC | PRN
Start: 2017-02-16 — End: 2017-03-04

## 2017-02-16 MED FILL — oxyCODONE HCL 20 MG TABS: 20 | 23 days supply | Qty: 90 | Fill #0

## 2017-02-16 NOTE — Progress Notes (Signed)
Patient ID: Alejandro Soto, male    DOB: [birth year], 31 y.o.   MRN: [id number]  PCP: Dorena Dew, FNP  Chief Complaint  Patient presents with  . Follow-up    Medication management    Subjective:  HPI Alejandro Soto is a 31 y.o. male with Sickle Cell Anemia presents today for medication management. Alejandro Soto reports that his pain has been stable since his last office visit. He attempted to manage pain with ibuprofen before taking prescribed Oxycodone 20 mg. He reports occasional pain free days. Last hospital admission was in January 2018. Alejandro Soto reports making efforts to try and improve health and manage inflammation through diet and natural substances. He is a current every smoker. Denies chest pain, chronic cough, dysuria, headache, abdominal pain, nausea, or vomiting.  Social History   Social History  . Marital status: Single    Spouse name: N/A  . Number of children: N/A  . Years of education: N/A   Occupational History  . Not on file.   Social History Main Topics  . Smoking status: Current Some Day Smoker    Packs/day: 0.25    Years: 5.00    Types: Cigarettes  . Smokeless tobacco: Never Used  . Alcohol use No  . Drug use: No  . Sexual activity: Yes   Other Topics Concern  . Not on file   Social History Narrative  . No narrative on file    Family History  Problem Relation Age of Onset  . Cancer Maternal Grandmother        colon   . Diabetes Father   . Cancer Father 51       Prostate  . Asthma Brother    Review of Systems See HPI Patient Active Problem List   Diagnosis Date Noted  . 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 Chronic back pain greater than 3 months duration 04/09/2015  . Sickle cell pain crisis ([place]) 12/24/2014  . Abdominal pain, epigastric 12/24/2014  . Thrombocytosis ([place]) 07/14/2014  . Leukocytosis 07/14/2014  . Anemia 07/14/2014  . Sore throat 07/03/2014  . Sebaceous cyst 07/03/2014  . Atopic dermatitis 06/12/2014  . Tobacco dependence  04/25/2014  . Neck pain, bilateral 03/01/2014  . Hb-SS disease without crisis ([place]) 02/22/2014  . DJD (degenerative joint disease), lumbosacral 09/18/2013  . Vitamin D deficiency 09/18/2013  . Neuropathic pain 09/18/2013  . Back pain, acute 08/11/2013  . Opiate use 05/24/2013  . Chronic pain syndrome 05/24/2013  . Sickle cell disease, type SS ([place]) 05/24/2013  . Back pain 11/01/2012  . Pain in joint, shoulder region 11/01/2012  . Pain in joint, lower leg 11/01/2012    Allergies  Allergen Reactions  . Amoxicillin Diarrhea    Prior to Admission medications   Medication Sig Start Date End Date Taking? Authorizing Provider  folic acid (FOLVITE) 1 MG tablet Take 1 tablet (1 mg total) by mouth daily. 08/14/16  Yes Dorena Dew, FNP  gabapentin (NEURONTIN) 300 MG capsule Take 1 capsule (300 mg total) by mouth 2 (two) times daily. 12/16/16  Yes Dorena Dew, FNP  hydroxyurea (HYDREA) 500 MG capsule Take 3 capsules (1,500 mg total) by mouth daily. May take with food to minimize GI side effects. 10/13/16  Yes Dorena Dew, FNP  ibuprofen (ADVIL,MOTRIN) 600 MG tablet Take 1 tablet (600 mg total) by mouth every 8 (eight) hours as needed for mild pain or moderate pain. 10/30/14  Yes Dorena Dew, FNP  L-glutamine (ENDARI) 5 g PACK Powder  Packet Take 10 g by mouth 2 (two) times daily. 12/16/16  Yes Dorena Dew, FNP  Multiple Vitamin (MULTIVITAMIN) tablet Take 2 tablets by mouth daily.    Yes [provider]  Oxycodone HCl 20 MG TABS Take 1 tablet (20 mg total) by mouth every 6 (six) hours as needed. 01/22/17  Yes Dorena Dew, FNP   Past Medical, Surgical Family and Social History reviewed and updated.   Objective:   Today's Vitals   02/16/17 1125  BP: 119/68  Pulse: 81  Resp: 14  Temp: 98.1 F (36.7 C)  TempSrc: Oral  Weight: 149 lb (67.6 kg)  Height: 5\' 7"  (1.702 m)    Wt Readings from Last 3 Encounters:  02/16/17 149 lb (67.6 kg)  12/16/16 135 lb  (61.2 kg)  10/13/16 140 lb (63.5 kg)    Physical Exam  Constitutional: He is oriented to person, place, and time. He appears well-developed and well-nourished.  HENT:  Head: Normocephalic and atraumatic.  Eyes: Pupils are equal, round, and reactive to light. Conjunctivae and EOM are normal.  Neck: Normal range of motion. Neck supple.  Cardiovascular: Normal rate, normal heart sounds and intact distal pulses.   Pulmonary/Chest: Effort normal and breath sounds normal.  Abdominal: Soft. Bowel sounds are normal.  Neurological: He is alert and oriented to person, place, and time.  Skin: Skin is warm and dry.  Psychiatric: He has a normal mood and affect. His behavior is normal. Judgment and thought content normal.   Assessment & Plan:  1. Hb-SS disease without crisis ([place]) Continue Hydrea. We discussed the need for good hydration, monitoring of hydration status, avoidance of heat, cold, stress, and infection triggers. We discussed the risks and benefits of Hydrea, including bone marrow suppression, the possibility of GI upset, skin ulcers, hair thinning, and teratogenicity. The patient was reminded of the need to seek medical attention for any symptoms of bleeding, anemia, or infection. Continue folic acid 1 mg daily to prevent aplastic bone marrow crises.  Pulmonary evaluation - Patient denies severe recurrent wheezes, shortness of breath with exercise, or persistent cough. If these symptoms develop, pulmonary function tests with spirometry will be ordered, and if abnormal, plan on referral to Pulmonology for further evaluation.  Eye - High risk of proliferative retinopathy. Annual eye exam with retinal exam recommended to patient, the patient has had eye exam this year.  Immunization status - Yearly influenza vaccination is recommended, as well as being up to date with Meningococcal and Pneumococcal vaccines.   Acute and chronic painful episodes - We agreed on Opiate dose and amount of pills   per month. We discussed that pt is to receive Schedule II prescriptions only from our clinic. Pt is also aware that the prescription history is available to [place] online through the [facility] CSRS. Controlled substance agreement reviewed and signed. We reminded  .[name] that all patients receiving Schedule II narcotics must be seen for follow within one month of prescription being requested. We reviewed the terms of our pain agreement, including the need to keep medicines in a safe locked location away from children or pets, and the need to report excess sedation or constipation, measures to avoid constipation, and policies related to early refills and stolen prescriptions. According to the [place] Chronic Pain Initiative program, we have reviewed details related to analgesia, adverse effects and aberrant behaviors.    2. Chronic pain, other  -Oxycodone 20 mg every 6 hours as needed for pain, qty # 90  Return in about 4 weeks for Sickle Cell Disease/Pain.  The patient was given clear instructions to go to ER or return to medical center if symptoms don't improve, worsen or new problems develop. The patient verbalized understanding. The patient was told to call to get lab results if they haven't heard anything in the next week.     Carroll Sage. Kenton Kingfisher, MSN, FNP-C [facility]  [address] [place], [place] [address] [phone]

## 2017-02-17 LAB — COMPLETE METABOLIC PANEL WITH GFR
ALBUMIN: 4.3 g/dL (ref 3.6–5.1)
ALK PHOS: 57 U/L (ref 40–115)
ALT: 35 U/L (ref 9–46)
AST: 34 U/L (ref 10–40)
BILIRUBIN TOTAL: 0.7 mg/dL (ref 0.2–1.2)
BUN: 6 mg/dL — ABNORMAL LOW (ref 7–25)
CALCIUM: 9.2 mg/dL (ref 8.6–10.3)
CO2: 22 mmol/L (ref 20–32)
CREATININE: 0.73 mg/dL (ref 0.60–1.35)
Chloride: 105 mmol/L (ref 98–110)
Glucose, Bld: 62 mg/dL — ABNORMAL LOW (ref 65–99)
Potassium: 4.7 mmol/L (ref 3.5–5.3)
Sodium: 139 mmol/L (ref 135–146)
TOTAL PROTEIN: 6.7 g/dL (ref 6.1–8.1)

## 2017-03-04 ENCOUNTER — Telehealth: Payer: Self-pay

## 2017-03-04 ENCOUNTER — Other Ambulatory Visit: Payer: Self-pay | Admitting: Family Medicine

## 2017-03-04 DIAGNOSIS — D571 Sickle-cell disease without crisis: Secondary | ICD-10-CM

## 2017-03-04 MED ORDER — OXYCODONE HCL 20 MG PO TABS: 1.0000 | ORAL_TABLET | Freq: Four times a day (QID) | ORAL | 0 refills | Status: DC | PRN

## 2017-03-04 NOTE — Progress Notes (Signed)
Reviewed [place] Substance Reporting system prior to prescribing opiate medications. No inconsistencies noted.  Meds ordered this encounter  Medications  . Oxycodone HCl 20 MG TABS    Sig: Take 1 tablet (20 mg total) by mouth every 6 (six) hours as needed.    Dispense:  90 tablet    Refill:  0    Rx not to be filled prior to 03/11/2017    Order Specific Question:   Supervising Provider    Answer:   Tresa Garter [[id number]]     Donia Pounds  MSN, FNP-C Patient [facility] [address] [place], [place] [address] [phone]

## 2017-03-07 DIAGNOSIS — D5700 Hb-SS disease with crisis, unspecified: Secondary | ICD-10-CM | POA: Diagnosis not present

## 2017-03-07 DIAGNOSIS — Z87891 Personal history of nicotine dependence: Secondary | ICD-10-CM | POA: Diagnosis not present

## 2017-03-07 DIAGNOSIS — Z79899 Other long term (current) drug therapy: Secondary | ICD-10-CM | POA: Diagnosis not present

## 2017-03-07 DIAGNOSIS — Z880 Allergy status to penicillin: Secondary | ICD-10-CM | POA: Diagnosis not present

## 2017-03-07 DIAGNOSIS — J069 Acute upper respiratory infection, unspecified: Secondary | ICD-10-CM | POA: Diagnosis not present

## 2017-03-09 MED FILL — oxyCODONE HCL 20 MG TABS: 20 | 23 days supply | Qty: 90 | Fill #0

## 2017-03-09 MED FILL — AZITHROMYCIN 250 MG TAB: 250 | 5 days supply | Qty: 6 | Fill #0

## 2017-03-25 ENCOUNTER — Telehealth: Payer: Self-pay

## 2017-03-26 ENCOUNTER — Other Ambulatory Visit: Payer: Self-pay | Admitting: Family Medicine

## 2017-03-26 DIAGNOSIS — D571 Sickle-cell disease without crisis: Secondary | ICD-10-CM

## 2017-03-26 MED ORDER — OXYCODONE HCL 20 MG PO TABS: 1.0000 | ORAL_TABLET | Freq: Four times a day (QID) | ORAL | 0 refills | Status: DC | PRN

## 2017-03-26 NOTE — Progress Notes (Signed)
Reviewed [place] Substance Reporting system prior to prescribing opiate medications. No inconsistencies noted.   Meds ordered this encounter  Medications  . Oxycodone HCl 20 MG TABS    Sig: Take 1 tablet (20 mg total) by mouth every 6 (six) hours as needed.    Dispense:  90 tablet    Refill:  0    Order Specific Question:   Supervising Provider    Answer:   Tresa Garter [[id number]]     Alejandro Pounds  MSN, FNP-C Patient [facility] [address] [place], [place] [address] [phone]

## 2017-03-30 MED FILL — oxyCODONE HCL 20 MG TABS: 20 | 23 days supply | Qty: 90 | Fill #0

## 2017-04-15 ENCOUNTER — Telehealth: Payer: Self-pay

## 2017-04-16 ENCOUNTER — Other Ambulatory Visit: Payer: Self-pay | Admitting: Family Medicine

## 2017-04-16 DIAGNOSIS — Z79891 Long term (current) use of opiate analgesic: Secondary | ICD-10-CM

## 2017-04-16 DIAGNOSIS — D571 Sickle-cell disease without crisis: Secondary | ICD-10-CM

## 2017-04-16 MED ORDER — OXYCODONE HCL 20 MG PO TABS: 1.0000 | ORAL_TABLET | Freq: Four times a day (QID) | ORAL | 0 refills | Status: DC | PRN

## 2017-04-16 NOTE — Progress Notes (Signed)
Reviewed [place] Substance Reporting system prior to prescribing opiate medications. No inconsistencies noted.   Meds ordered this encounter  Medications  . Oxycodone HCl 20 MG TABS    Sig: Take 1 tablet (20 mg total) by mouth every 6 (six) hours as needed.    Dispense:  90 tablet    Refill:  0    Order Specific Question:   Supervising Provider    Answer:   Tresa Garter [[id number]]    Donia Pounds  MSN, FNP-C Patient [facility] [address] [place], [place] [address] [phone]

## 2017-04-19 ENCOUNTER — Ambulatory Visit (INDEPENDENT_AMBULATORY_CARE_PROVIDER_SITE_OTHER): Payer: Medicaid Other | Admitting: Family Medicine

## 2017-04-19 ENCOUNTER — Encounter: Payer: Self-pay | Admitting: Family Medicine

## 2017-04-19 VITALS — BP 123/70 | HR 71 | Temp 98.4°F | Resp 14 | Ht 67.0 in | Wt 154.0 lb

## 2017-04-19 DIAGNOSIS — G894 Chronic pain syndrome: Secondary | ICD-10-CM | POA: Diagnosis not present

## 2017-04-19 DIAGNOSIS — D571 Sickle-cell disease without crisis: Secondary | ICD-10-CM

## 2017-04-19 LAB — CBC WITH DIFFERENTIAL/PLATELET
BASOS ABS: 67 {cells}/uL (ref 0–200)
Basophils Relative: 0.5 %
EOS ABS: 692 {cells}/uL — AB (ref 15–500)
EOS PCT: 5.2 %
HEMATOCRIT: 31.5 % — AB (ref 38.5–50.0)
HEMOGLOBIN: 10.9 g/dL — AB (ref 13.2–17.1)
Lymphs Abs: 4495 cells/uL — ABNORMAL HIGH (ref 850–3900)
MCH: 30.5 pg (ref 27.0–33.0)
MCHC: 34.6 g/dL (ref 32.0–36.0)
MCV: 88.2 fL (ref 80.0–100.0)
MPV: 9.6 fL (ref 7.5–12.5)
Monocytes Relative: 8.8 %
NEUTROS PCT: 51.7 %
Neutro Abs: 6876 cells/uL (ref 1500–7800)
Platelets: 480 10*3/uL — ABNORMAL HIGH (ref 140–400)
RBC: 3.57 10*6/uL — AB (ref 4.20–5.80)
RDW: 17.5 % — AB (ref 11.0–15.0)
TOTAL LYMPHOCYTE: 33.8 %
WBC mixed population: 1170 cells/uL — ABNORMAL HIGH (ref 200–950)
WBC: 13.3 10*3/uL — ABNORMAL HIGH (ref 3.8–10.8)

## 2017-04-19 LAB — COMPLETE METABOLIC PANEL WITH GFR
AG RATIO: 1.6 (calc) (ref 1.0–2.5)
ALT: 40 U/L (ref 9–46)
AST: 43 U/L — ABNORMAL HIGH (ref 10–40)
Albumin: 4.5 g/dL (ref 3.6–5.1)
Alkaline phosphatase (APISO): 55 U/L (ref 40–115)
BILIRUBIN TOTAL: 1 mg/dL (ref 0.2–1.2)
BUN: 12 mg/dL (ref 7–25)
CALCIUM: 9.6 mg/dL (ref 8.6–10.3)
CHLORIDE: 104 mmol/L (ref 98–110)
CO2: 26 mmol/L (ref 20–32)
Creat: 0.74 mg/dL (ref 0.60–1.35)
GFR, EST AFRICAN AMERICAN: 142 mL/min/{1.73_m2} (ref >=60)
GFR, EST NON AFRICAN AMERICAN: 123 mL/min/{1.73_m2} (ref >=60)
GLOBULIN: 2.9 g/dL (ref 1.9–3.7)
Glucose, Bld: 85 mg/dL (ref 65–99)
POTASSIUM: 4.3 mmol/L (ref 3.5–5.3)
SODIUM: 139 mmol/L (ref 135–146)
TOTAL PROTEIN: 7.4 g/dL (ref 6.1–8.1)

## 2017-04-19 LAB — POCT URINALYSIS DIP (DEVICE)
Bilirubin Urine: NEGATIVE
GLUCOSE, UA: NEGATIVE mg/dL
Hgb urine dipstick: NEGATIVE
KETONES UR: NEGATIVE mg/dL
Leukocytes, UA: NEGATIVE
Nitrite: NEGATIVE
Protein, ur: NEGATIVE mg/dL
SPECIFIC GRAVITY, URINE: 1.015 (ref 1.005–1.030)
Urobilinogen, UA: 0.2 mg/dL (ref 0.0–1.0)
pH: 5.5 (ref 5.0–8.0)

## 2017-04-19 LAB — RETICULOCYTES
ABS RETIC: 267750 {cells}/uL — AB (ref 25000–9000)
Retic Ct Pct: 7.5 %

## 2017-04-19 MED FILL — oxyCODONE HCL 20 MG TABS: 20 | 23 days supply | Qty: 90 | Fill #0

## 2017-04-19 NOTE — Patient Instructions (Signed)
We will continue all medications as previously  prescribed no changes warranted on today   Discussed the importance of drinking 64 ounces of water daily. The Importance of Water. To help prevent pain crises, it is important to drink plenty of water throughout the day. This is because dehydration of red blood cells may lead to the sickling process.   We will follow-up by phone with any abnormal lab results. Sickle Cell Anemia, Adult Sickle cell anemia is a condition where your red blood cells are shaped like sickles. Red blood cells carry oxygen through the body. Sickle-shaped red blood cells do not live as long as normal red blood cells. They also clump together and block blood from flowing through the blood vessels. These things prevent the body from getting enough oxygen. Sickle cell anemia causes organ damage and pain. It also increases the risk of infection. Follow these instructions at home:  Drink enough fluid to keep your pee (urine) clear or pale yellow. Drink more in hot weather and during exercise.  Do not smoke. Smoking lowers oxygen levels in the blood.  Only take over-the-counter or prescription medicines as told by your doctor.  Take antibiotic medicines as told by your doctor. Make sure you finish them even if you start to feel better.  Take supplements as told by your doctor.  Consider wearing a medical alert bracelet. This tells anyone caring for you in an emergency of your condition.  When traveling, keep your medical information, doctors' names, and the medicines you take with you at all times.  If you have a fever, do not take fever medicines right away. This could cover up a problem. Tell your doctor.  Keep all follow-up visits with your doctor. Sickle cell anemia requires regular medical care. Contact a doctor if: You have a fever. Get help right away if:  You feel dizzy or faint.  You have new belly (abdominal) pain, especially on the left side near the stomach  area.  You have a lasting, often uncomfortable and painful erection of the penis (priapism). If it is not treated right away, you will become unable to have sex (impotence).  You have numbness in your arms or legs or you have a hard time moving them.  You have a hard time talking.  You have a fever or lasting symptoms for more than 2-3 days.  You have a fever and your symptoms suddenly get worse.  You have signs or symptoms of infection. These include: ? Chills. ? Being more tired than normal (lethargy). ? Irritability. ? Poor eating. ? Throwing up (vomiting).  You have pain that is not helped with medicine.  You have shortness of breath.  You have pain in your chest.  You are coughing up pus-like or bloody mucus.  You have a stiff neck.  Your feet or hands swell or have pain.  Your belly looks bloated.  Your joints hurt. This information is not intended to replace advice given to you by your health care provider. Make sure you discuss any questions you have with your health care provider. Document Released: 03/29/2013 Document Revised: 11/14/2015 Document Reviewed: 01/18/2013 Elsevier Interactive Patient Education  2017 [employer].

## 2017-04-19 NOTE — Progress Notes (Signed)
Subjective:    Patient ID: Alejandro Soto, male    DOB: 27-Apr-1986, 31 y.o.   MRN: [id number]  HPI  Mr. Caydn Justen, a 31 year old male with a history of sickle cell anemia, HbSS presents for a 2 month follow up of sickle cell anemia and medication management. Mr. Spooner says that he feels well and is without complaint. He continues to have chronic pain related to sickle cell anemia. Pain intensity is 4/10 and is described as intermittent and aching. Pain is primarily to lower abdomen and lower extremities.  He last had Oxycodone 20 mg this am with sustained relief. He is hydrating, eating a balanced diet, and taking all other medications consistently. Mr. Jablonsky denies headache, shortness of breath, chest pain, dysuria, nausea, vomiting, or diarrhea.  Past Medical History:  Diagnosis Date  . Pneumonia   . Sickle cell anemia (HCC)    Social History   Social History  . Marital status: Single    Spouse name: N/A  . Number of children: N/A  . Years of education: N/A   Occupational History  . Not on file.   Social History Main Topics  . Smoking status: Current Some Day Smoker    Packs/day: 0.25    Years: 5.00    Types: Cigarettes  . Smokeless tobacco: Never Used  . Alcohol use No  . Drug use: No  . Sexual activity: Yes   Other Topics Concern  . Not on file   Social History Narrative  . No narrative on file   Immunization History  Administered Date(s) Administered  . Influenza Whole 03/21/2012  . Influenza,inj,Quad PF,6+ Mos 03/21/2013, 03/01/2014  . Pneumococcal Conjugate-13 03/21/2012  . Pneumococcal Polysaccharide-23 07/15/2014  . Tdap 08/14/2016   Immunization History  Administered Date(s) Administered  . Influenza Whole 03/21/2012  . Influenza,inj,Quad PF,6+ Mos 03/21/2013, 03/01/2014  . Pneumococcal Conjugate-13 03/21/2012  . Pneumococcal Polysaccharide-23 07/15/2014  . Tdap 08/14/2016    Review of Systems  Constitutional: Negative.   HENT: Negative.    Eyes: Negative.   Respiratory: Negative.   Cardiovascular: Negative.  Negative for chest pain, palpitations and leg swelling.  Gastrointestinal: Positive for abdominal pain. Negative for blood in stool, constipation, diarrhea and vomiting.  Endocrine: Negative.   Genitourinary: Negative.   Musculoskeletal: Positive for myalgias.  Skin: Negative.   Allergic/Immunologic: Negative.   Neurological: Negative.   Hematological: Negative.   Psychiatric/Behavioral: Negative.        Objective:   Physical Exam  Constitutional: He is oriented to person, place, and time. He appears well-developed and well-nourished.  HENT:  Head: Normocephalic and atraumatic.  Right Ear: External ear normal.  Left Ear: External ear normal.  Nose: Nose normal.  Mouth/Throat: Oropharynx is clear and moist.  Eyes: Pupils are equal, round, and reactive to light. Conjunctivae and EOM are normal.  Neck: Normal range of motion. Neck supple.  Cardiovascular: Normal rate, regular rhythm, normal heart sounds and intact distal pulses.   Pulmonary/Chest: Effort normal and breath sounds normal.  Abdominal: Soft. Bowel sounds are normal. There is generalized tenderness.  Musculoskeletal: Normal range of motion.  Neurological: He is alert and oriented to person, place, and time. He has normal reflexes.  Skin: Skin is warm.  Psychiatric: He has a normal mood and affect. His behavior is normal. Judgment and thought content normal.     BP 123/70 (BP Location: Left Arm, Patient Position: Sitting, Cuff Size: Normal)   Pulse 71   Temp 98.4 F (36.9 C) (  Oral)   Resp 14   Ht 5\' 7"  (1.702 m)   Wt 154 lb (69.9 kg)   SpO2 100%   BMI 24.12 kg/m  Assessment & Plan:  1. Sickle cell disease, type SS (HCC)  Continue Hydrea at current dosage. Reviewed labs. We discussed the need for good hydration, monitoring of hydration status, avoidance of heat, cold, stress, and infection triggers. We discussed the risks and benefits of  Hydrea, including bone marrow suppression, the possibility of GI upset, skin ulcers, hair thinning, and teratogenicity. The patient was reminded of the need to seek medical attention of any symptoms of bleeding, anemia, or infection. Continue folic acid 1 mg daily to prevent aplastic bone marrow crises.  ssed in detail that hydroxyurea has been found to be effective in reducing VOC, transfusions, and admissions for pain. It also reduces the duration of pain episodes by at least 50%. It depends on compliance. Compliance can be assessed by a rise in [facility] and drop in WBC/retic/ANC, and platelet count. It typically causes a rise in hemoglobin F, which can be assessed via hemoglobinopathy. It takes 3-6 months to see optimal effects of hydroxyurea therapy.   Cardiac - Routine screening for pulmonary hypertension is not recommended.  Eye - High risk of proliferative retinopathy. Annual eye exam scheduled in 1 week with Dr. Baldemar Lenis   Immunization status - Immunizations up to date Acute and chronic painful episodes -Will continue opiate medications as current dosage.  We discussed that pt is to receive his Schedule II prescriptions only from [place]. Pt is also aware that the prescription history is available to [place] online through the [facility] CSRS. Controlled substance agreement signed previously. We reminded Zelphia Cairo that all patients receiving Schedule II narcotics must be seen for follow within one month of prescription being requested. We reviewed the terms of our pain agreement, including the need to keep medicines in a safe locked location away from children or pets, and the need to report excess sedation or constipation, measures to avoid constipation, and policies related to early refills and stolen prescriptions. According to the [place] Chronic Pain Initiative program, we have reviewed details related to analgesia, adverse effects, aberrant behaviors. Reviewed [place] Substance Reporting system prior to prescribing opiate  medications. No inconsistencies noted.   - COMPLETE METABOLIC PANEL WITH GFR - CBC with Differential - Reticulocytes  2. Chronic pain syndrome     Opioid Risk Tool - 04/19/17 1232      Family History of Substance Abuse   Alcohol Negative   Illegal Drugs Negative   Rx Drugs Negative     Personal History of Substance Abuse   Alcohol Negative   Illegal Drugs Negative   Rx Drugs Negative     Age   Age between 30-45 years  Yes     History of Preadolescent Sexual Abuse   History of Preadolescent Sexual Abuse Negative or Male     Psychological Disease   Psychological Disease Negative   Depression Negative     Total Score   Opioid Risk Tool Scoring 1   Opioid Risk Interpretation Low Risk     - Pain Mgmt, Profile 8 w/Conf, U  RTC: 2 months for sickle cell anemia   Donia Pounds  MSN, FNP-C [facility] [address] [place], [place] [address] [phone]

## 2017-04-25 LAB — PAIN MGMT, PROFILE 8 W/CONF, U
6 ACETYLMORPHINE: NEGATIVE ng/mL (ref <10)
ALCOHOL METABOLITES: NEGATIVE ng/mL (ref <500)
ALPHAHYDROXYTRIAZOLAM: NEGATIVE ng/mL (ref <50)
AMINOCLONAZEPAM: NEGATIVE ng/mL (ref <25)
AMPHETAMINES: NEGATIVE ng/mL (ref <500)
Alphahydroxyalprazolam: NEGATIVE ng/mL (ref <25)
Alphahydroxymidazolam: NEGATIVE ng/mL (ref <50)
Benzodiazepines: NEGATIVE ng/mL (ref <100)
Buprenorphine, Urine: NEGATIVE ng/mL (ref <5)
CODEINE: NEGATIVE ng/mL (ref <50)
CREATININE: 93.2 mg/dL
Cocaine Metabolite: NEGATIVE ng/mL (ref <150)
Hydrocodone: NEGATIVE ng/mL (ref <50)
Hydromorphone: NEGATIVE ng/mL (ref <50)
Hydroxyethylflurazepam: NEGATIVE ng/mL (ref <50)
Lorazepam: NEGATIVE ng/mL (ref <50)
MDMA: NEGATIVE ng/mL (ref <500)
MORPHINE: 1528 ng/mL — AB (ref <50)
Marijuana Metabolite: NEGATIVE ng/mL (ref <20)
NORDIAZEPAM: NEGATIVE ng/mL (ref <50)
NORHYDROCODONE: NEGATIVE ng/mL (ref <50)
OPIATES: POSITIVE ng/mL — AB (ref <100)
OXAZEPAM: NEGATIVE ng/mL (ref <50)
OXIDANT: NEGATIVE ug/mL (ref <200)
Oxycodone: NEGATIVE ng/mL (ref <100)
PH: 6.06 (ref 4.5–9.0)
TEMAZEPAM: NEGATIVE ng/mL (ref <50)

## 2017-05-06 ENCOUNTER — Telehealth: Payer: Self-pay

## 2017-05-07 ENCOUNTER — Other Ambulatory Visit: Payer: Self-pay | Admitting: Family Medicine

## 2017-05-07 DIAGNOSIS — D571 Sickle-cell disease without crisis: Secondary | ICD-10-CM

## 2017-05-07 MED ORDER — OXYCODONE HCL 20 MG PO TABS: 1.0000 | ORAL_TABLET | Freq: Four times a day (QID) | ORAL | 0 refills | Status: DC | PRN

## 2017-05-07 NOTE — Progress Notes (Signed)
Reviewed [place] Substance Reporting system prior to prescribing opiate medications. No inconsistencies noted.   Meds ordered this encounter  Medications  . Oxycodone HCl 20 MG TABS    Sig: Take 1 tablet (20 mg total) every 6 (six) hours as needed by mouth.    Dispense:  90 tablet    Refill:  0    Order Specific Question:   Supervising Provider    Answer:   Tresa Garter [[id number]]    Donia Pounds  MSN, FNP-C Patient [facility] [address] [place], [place] [address] [phone]

## 2017-05-11 MED FILL — oxyCODONE HCL 20 MG TABS: 20 | 23 days supply | Qty: 90 | Fill #0

## 2017-05-25 ENCOUNTER — Telehealth: Payer: Self-pay

## 2017-05-28 ENCOUNTER — Other Ambulatory Visit: Payer: Self-pay | Admitting: Family Medicine

## 2017-05-28 DIAGNOSIS — Z79891 Long term (current) use of opiate analgesic: Secondary | ICD-10-CM

## 2017-05-28 DIAGNOSIS — D571 Sickle-cell disease without crisis: Secondary | ICD-10-CM

## 2017-05-28 MED ORDER — OXYCODONE HCL 20 MG PO TABS: 1.0000 | ORAL_TABLET | Freq: Four times a day (QID) | ORAL | 0 refills | Status: DC | PRN

## 2017-05-28 NOTE — Progress Notes (Signed)
Reviewed [place] Substance Reporting system prior to prescribing opiate medications. No inconsistencies noted.    Meds ordered this encounter  Medications  . Oxycodone HCl 20 MG TABS    Sig: Take 1 tablet (20 mg total) by mouth every 6 (six) hours as needed.    Dispense:  90 tablet    Refill:  0    Rx not to be filled prior to 06/03/2017    Order Specific Question:   Supervising Provider    Answer:   Tresa Garter [[id number]]    Donia Pounds  MSN, FNP-C Patient [facility] [address] [place], [place] [address] [phone]

## 2017-06-23 ENCOUNTER — Encounter: Payer: Self-pay | Admitting: Family Medicine

## 2017-06-23 ENCOUNTER — Ambulatory Visit (INDEPENDENT_AMBULATORY_CARE_PROVIDER_SITE_OTHER): Payer: Medicare Other | Admitting: Family Medicine

## 2017-06-23 VITALS — BP 119/68 | HR 68 | Temp 98.3°F | Resp 16 | Ht 67.0 in | Wt 157.0 lb

## 2017-06-23 DIAGNOSIS — F17200 Nicotine dependence, unspecified, uncomplicated: Secondary | ICD-10-CM

## 2017-06-23 DIAGNOSIS — Z79891 Long term (current) use of opiate analgesic: Secondary | ICD-10-CM | POA: Diagnosis not present

## 2017-06-23 DIAGNOSIS — D571 Sickle-cell disease without crisis: Secondary | ICD-10-CM

## 2017-06-23 LAB — POCT URINALYSIS DIP (DEVICE)
BILIRUBIN URINE: NEGATIVE
Glucose, UA: NEGATIVE mg/dL
Hgb urine dipstick: NEGATIVE
KETONES UR: NEGATIVE mg/dL
Leukocytes, UA: NEGATIVE
Nitrite: NEGATIVE
PH: 7.5 (ref 5.0–8.0)
PROTEIN: NEGATIVE mg/dL
SPECIFIC GRAVITY, URINE: 1.015 (ref 1.005–1.030)
Urobilinogen, UA: 0.2 mg/dL (ref 0.0–1.0)

## 2017-06-23 MED ORDER — OXYCODONE HCL 20 MG PO TABS: 1.0000 | ORAL_TABLET | Freq: Four times a day (QID) | ORAL | 0 refills | Status: DC | PRN

## 2017-06-23 MED FILL — oxyCODONE HCL 20 MG TABS: 20 | 23 days supply | Qty: 90 | Fill #0

## 2017-06-23 NOTE — Progress Notes (Signed)
Subjective:    Patient ID: Alejandro Soto, male    DOB: 1986/05/24, 32 y.o.   MRN: [id number]  HPI   Alejandro Soto, a 32 year old male with a history of sickle cell anemia, HbSS presents for a 2 month follow up and medication management. Alejandro Soto says that he continues to have chronic pain related to sickle cell anemia. Pain intensity is 4/10 characterized as intermittent and aching.  He last had Oxycodone 20 mg this am without sustained relief. He is hydrating, eating a balanced diet, and taking all other medications consistently. Alejandro Soto denies headache, shortness of breath, chest pain, dysuria, nausea, vomiting, or diarrhea.  Past Medical History:  Diagnosis Date  . Pneumonia   . Sickle cell anemia (HCC)    Social History   Socioeconomic History  . Marital status: Single    Spouse name: Not on file  . Number of children: Not on file  . Years of education: Not on file  . Highest education level: Not on file  Social Needs  . Financial resource strain: Not on file  . Food insecurity - worry: Not on file  . Food insecurity - inability: Not on file  . Transportation needs - medical: Not on file  . Transportation needs - non-medical: Not on file  Occupational History  . Not on file  Tobacco Use  . Smoking status: Current Some Day Smoker    Packs/day: 0.25    Years: 5.00    Pack years: 1.25    Types: Cigarettes  . Smokeless tobacco: Never Used  Substance and Sexual Activity  . Alcohol use: No    Alcohol/week: 0.0 oz  . Drug use: No  . Sexual activity: Yes  Other Topics Concern  . Not on file  Social History Narrative  . Not on file   Immunization History  Administered Date(s) Administered  . Influenza Whole 03/21/2012  . Influenza,inj,Quad PF,6+ Mos 03/21/2013, 03/01/2014  . Pneumococcal Conjugate-13 03/21/2012  . Pneumococcal Polysaccharide-23 07/15/2014  . Tdap 08/14/2016   Immunization History  Administered Date(s) Administered  . Influenza Whole 03/21/2012   . Influenza,inj,Quad PF,6+ Mos 03/21/2013, 03/01/2014  . Pneumococcal Conjugate-13 03/21/2012  . Pneumococcal Polysaccharide-23 07/15/2014  . Tdap 08/14/2016    Review of Systems  Constitutional: Negative.   HENT: Negative.   Eyes: Negative.   Respiratory: Negative.   Cardiovascular: Negative.  Negative for chest pain, palpitations and leg swelling.  Gastrointestinal: Negative for blood in stool, constipation, diarrhea and vomiting.  Endocrine: Negative.   Genitourinary: Negative.   Musculoskeletal: Positive for back pain and myalgias.  Skin: Negative.   Allergic/Immunologic: Negative.   Neurological: Negative.   Hematological: Negative.   Psychiatric/Behavioral: Negative.        Objective:   Physical Exam  Constitutional: He is oriented to person, place, and time. He appears well-developed and well-nourished.  HENT:  Head: Normocephalic and atraumatic.  Right Ear: External ear normal.  Left Ear: External ear normal.  Nose: Nose normal.  Mouth/Throat: Oropharynx is clear and moist.  Eyes: Conjunctivae and EOM are normal. Pupils are equal, round, and reactive to light.  Neck: Normal range of motion. Neck supple.  Cardiovascular: Normal rate, regular rhythm, normal heart sounds and intact distal pulses.  Pulmonary/Chest: Effort normal and breath sounds normal.  Abdominal: Soft. Bowel sounds are normal. There is generalized tenderness.  Musculoskeletal: Normal range of motion.  Neurological: He is alert and oriented to person, place, and time. He has normal reflexes.  Skin: Skin  is warm.  Psychiatric: He has a normal mood and affect. His behavior is normal. Judgment and thought content normal.     BP 119/68 (BP Location: Right Arm, Patient Position: Sitting, Cuff Size: Normal)   Pulse 68   Temp 98.3 F (36.8 C) (Oral)   Resp 16   Ht 5\' 7"  (1.702 m)   Wt 157 lb (71.2 kg)   SpO2 99%   BMI 24.59 kg/m  Assessment & Plan:  1. Sickle cell disease, type SS (HCC)   Continue Hydrea at current dosage. Reviewed labs. We discussed the need for good hydration, monitoring of hydration status, avoidance of heat, cold, stress, and infection triggers. We discussed the risks and benefits of Hydrea, including bone marrow suppression, the possibility of GI upset, skin ulcers, hair thinning, and teratogenicity. The patient was reminded of the need to seek medical attention of any symptoms of bleeding, anemia, or infection. Continue folic acid 1 mg daily to prevent aplastic bone marrow crises.  Cardiac - Routine screening for pulmonary hypertension is not recommended.  Eye - High risk of proliferative retinopathy. Annual eye exam scheduled in 1 week with Dr. Baldemar Lenis   Immunization status - Immunizations up to date Acute and chronic painful episodes -Will continue opiate medications as current dosage.  We discussed that pt is to receive his Schedule II prescriptions only from [place]. Pt is also aware that the prescription history is available to [place] online through the [facility] CSRS. Controlled substance agreement signed previously. We reminded Alejandro Soto that all patients receiving Schedule II narcotics must be seen for follow within one month of prescription being requested. We reviewed the terms of our pain agreement, including the need to keep medicines in a safe locked location away from children or pets, and the need to report excess sedation or constipation, measures to avoid constipation, and policies related to early refills and stolen prescriptions. According to the [place] Chronic Pain Initiative program, we have reviewed details related to analgesia, adverse effects, aberrant behaviors. Reviewed [place] Substance Reporting system prior to prescribing opiate medications. No inconsistencies noted.   - CMP and Liver - CBC with Differential - Reticulocytes - Oxycodone HCl 20 MG TABS; Take 1 tablet (20 mg total) by mouth every 6 (six) hours as needed.  Dispense: 90 tablet; Refill: 0  2.  Chronic prescription opiate use - ToxASSURE Select 13 (MW), Urine  3. Tobacco dependence Smoking cessation instruction/counseling given:  counseled patient on the dangers of tobacco use, advised patient to stop smoking, and reviewed strategies to maximize success   RTC: 2 months for medication management. Will follow up by phone with any abnormal laboratory results    Donia Pounds  MSN, FNP-C [place], [place] [address] [phone]

## 2017-06-24 LAB — CMP AND LIVER
ALT: 15 IU/L (ref 0–44)
AST: 28 IU/L (ref 0–40)
Albumin: 4.6 g/dL (ref 3.5–5.5)
Alkaline Phosphatase: 55 IU/L (ref 39–117)
BUN: 7 mg/dL (ref 6–20)
Bilirubin Total: 0.7 mg/dL (ref 0.0–1.2)
Bilirubin, Direct: 0.23 mg/dL (ref 0.00–0.40)
CALCIUM: 9.2 mg/dL (ref 8.7–10.2)
CO2: 21 mmol/L (ref 20–29)
Chloride: 102 mmol/L (ref 96–106)
Creatinine, Ser: 0.63 mg/dL — ABNORMAL LOW (ref 0.76–1.27)
GFR, EST AFRICAN AMERICAN: 152 mL/min/{1.73_m2} (ref >59)
GFR, EST NON AFRICAN AMERICAN: 131 mL/min/{1.73_m2} (ref >59)
Glucose: 60 mg/dL — ABNORMAL LOW (ref 65–99)
Potassium: 4.4 mmol/L (ref 3.5–5.2)
Sodium: 139 mmol/L (ref 134–144)
Total Protein: 7.3 g/dL (ref 6.0–8.5)

## 2017-06-24 LAB — CBC WITH DIFFERENTIAL/PLATELET
BASOS: 0 %
Basophils Absolute: 0 10*3/uL (ref 0.0–0.2)
EOS (ABSOLUTE): 0.3 10*3/uL (ref 0.0–0.4)
Eos: 3 %
HEMATOCRIT: 32.9 % — AB (ref 37.5–51.0)
Hemoglobin: 11.3 g/dL — ABNORMAL LOW (ref 13.0–17.7)
IMMATURE GRANS (ABS): 0 10*3/uL (ref 0.0–0.1)
Immature Granulocytes: 0 %
LYMPHS: 38 %
Lymphocytes Absolute: 5.1 10*3/uL — ABNORMAL HIGH (ref 0.7–3.1)
MCH: 32 pg (ref 26.6–33.0)
MCHC: 34.3 g/dL (ref 31.5–35.7)
MCV: 93 fL (ref 79–97)
MONOCYTES: 6 %
Monocytes Absolute: 0.8 10*3/uL (ref 0.1–0.9)
NEUTROS ABS: 7.2 10*3/uL — AB (ref 1.4–7.0)
NRBC: 1 % — ABNORMAL HIGH (ref 0–0)
Neutrophils: 53 %
PLATELETS: 447 10*3/uL — AB (ref 150–379)
RBC: 3.53 x10E6/uL — ABNORMAL LOW (ref 4.14–5.80)
RDW: 18.1 % — ABNORMAL HIGH (ref 12.3–15.4)
WBC: 13.5 10*3/uL — ABNORMAL HIGH (ref 3.4–10.8)

## 2017-06-24 LAB — RETICULOCYTES: Retic Ct Pct: 6.6 % — ABNORMAL HIGH (ref 0.6–2.6)

## 2017-06-24 NOTE — Patient Instructions (Signed)
Sickle Cell Anemia, Adult °Sickle cell anemia is a condition where your red blood cells are shaped like sickles. Red blood cells carry oxygen through the body. Sickle-shaped red blood cells do not live as long as normal red blood cells. They also clump together and block blood from flowing through the blood vessels. These things prevent the body from getting enough oxygen. Sickle cell anemia causes organ damage and pain. It also increases the risk of infection. °Follow these instructions at home: °· Drink enough fluid to keep your pee (urine) clear or pale yellow. Drink more in hot weather and during exercise. °· Do not smoke. Smoking lowers oxygen levels in the blood. °· Only take over-the-counter or prescription medicines as told by your doctor. °· Take antibiotic medicines as told by your doctor. Make sure you finish them even if you start to feel better. °· Take supplements as told by your doctor. °· Consider wearing a medical alert bracelet. This tells anyone caring for you in an emergency of your condition. °· When traveling, keep your medical information, doctors' names, and the medicines you take with you at all times. °· If you have a fever, do not take fever medicines right away. This could cover up a problem. Tell your doctor. °· Keep all follow-up visits with your doctor. Sickle cell anemia requires regular medical care. °Contact a doctor if: °You have a fever. °Get help right away if: °· You feel dizzy or faint. °· You have new belly (abdominal) pain, especially on the left side near the stomach area. °· You have a lasting, often uncomfortable and painful erection of the penis (priapism). If it is not treated right away, you will become unable to have sex (impotence). °· You have numbness in your arms or legs or you have a hard time moving them. °· You have a hard time talking. °· You have a fever or lasting symptoms for more than 2-3 days. °· You have a fever and your symptoms suddenly get  worse. °· You have signs or symptoms of infection. These include: °? Chills. °? Being more tired than normal (lethargy). °? Irritability. °? Poor eating. °? Throwing up (vomiting). °· You have pain that is not helped with medicine. °· You have shortness of breath. °· You have pain in your chest. °· You are coughing up pus-like or bloody mucus. °· You have a stiff neck. °· Your feet or hands swell or have pain. °· Your belly looks bloated. °· Your joints hurt. °This information is not intended to replace advice given to you by your health care provider. Make sure you discuss any questions you have with your health care provider. °Document Released: 03/29/2013 Document Revised: 11/14/2015 Document Reviewed: 01/18/2013 °Elsevier Interactive Patient Education © 2017 [employer] Inc. ° °

## 2017-06-28 LAB — TOXASSURE SELECT 13 (MW), URINE

## 2017-07-09 ENCOUNTER — Telehealth: Payer: Self-pay

## 2017-07-09 ENCOUNTER — Other Ambulatory Visit: Payer: Self-pay | Admitting: Family Medicine

## 2017-07-09 DIAGNOSIS — D571 Sickle-cell disease without crisis: Secondary | ICD-10-CM

## 2017-07-09 MED ORDER — OXYCODONE HCL 20 MG PO TABS
1.0000 | ORAL_TABLET | Freq: Four times a day (QID) | ORAL | 0 refills | Status: DC | PRN
Start: 2017-07-09 — End: 2017-08-05

## 2017-07-09 NOTE — Progress Notes (Signed)
Reviewed [place] Substance Reporting system prior to prescribing opiate medications. No inconsistencies noted.   Meds ordered this encounter  Medications  . Oxycodone HCl 20 MG TABS    Sig: Take 1 tablet (20 mg total) by mouth every 6 (six) hours as needed.    Dispense:  90 tablet    Refill:  0    Rx not to be filled prior to 07/16/2017    Order Specific Question:   Supervising Provider    Answer:   Tresa Garter [[id number]]    Indication for chronic opioid: Medication and dose:  # pills per month: 90 Last UDS date: 06/23/2017  Date narcotic database last reviewed (include red flags): 07/09/2017

## 2017-08-04 ENCOUNTER — Telehealth: Payer: Self-pay

## 2017-08-05 ENCOUNTER — Other Ambulatory Visit: Payer: Self-pay | Admitting: Family Medicine

## 2017-08-05 DIAGNOSIS — D571 Sickle-cell disease without crisis: Secondary | ICD-10-CM

## 2017-08-05 MED ORDER — OXYCODONE HCL 20 MG PO TABS: 1.0000 | ORAL_TABLET | Freq: Four times a day (QID) | ORAL | 0 refills | Status: DC | PRN

## 2017-08-05 NOTE — Progress Notes (Signed)
Reviewed [place] Substance Reporting system prior to prescribing opiate medications. No inconsistencies noted.   Meds ordered this encounter  Medications  . Oxycodone HCl 20 MG TABS    Sig: Take 1 tablet (20 mg total) by mouth every 6 (six) hours as needed.    Dispense:  90 tablet    Refill:  0    Rx not to be filled prior to 08/09/2017    Order Specific Question:   Supervising Provider    Answer:   Tresa Garter [[id number]]    Donia Pounds  MSN, FNP-C [facility] [address] [place],  [address] [phone]

## 2017-08-23 ENCOUNTER — Ambulatory Visit: Payer: Medicaid Other | Admitting: Family Medicine

## 2017-08-30 ENCOUNTER — Ambulatory Visit (INDEPENDENT_AMBULATORY_CARE_PROVIDER_SITE_OTHER): Payer: Medicare Other | Admitting: Family Medicine

## 2017-08-30 VITALS — BP 118/68 | HR 72 | Temp 97.9°F | Resp 14 | Ht 67.0 in | Wt 162.0 lb

## 2017-08-30 DIAGNOSIS — H547 Unspecified visual loss: Secondary | ICD-10-CM | POA: Diagnosis not present

## 2017-08-30 DIAGNOSIS — D571 Sickle-cell disease without crisis: Secondary | ICD-10-CM | POA: Diagnosis not present

## 2017-08-30 DIAGNOSIS — F1190 Opioid use, unspecified, uncomplicated: Secondary | ICD-10-CM

## 2017-08-30 LAB — POCT URINALYSIS DIP (DEVICE)
Bilirubin Urine: NEGATIVE
GLUCOSE, UA: NEGATIVE mg/dL
Hgb urine dipstick: NEGATIVE
KETONES UR: NEGATIVE mg/dL
Leukocytes, UA: NEGATIVE
NITRITE: NEGATIVE
PH: 7 (ref 5.0–8.0)
PROTEIN: NEGATIVE mg/dL
Specific Gravity, Urine: 1.015 (ref 1.005–1.030)
Urobilinogen, UA: 4 mg/dL — ABNORMAL HIGH (ref 0.0–1.0)

## 2017-08-30 MED ORDER — OXYCODONE HCL 20 MG PO TABS: 1.0000 | ORAL_TABLET | Freq: Four times a day (QID) | ORAL | 0 refills | Status: DC | PRN

## 2017-08-30 NOTE — Progress Notes (Signed)
Patient ID: Alejandro Soto, male    DOB: 1985-11-17, 32 y.o.   MRN: [id number]  PCP: Dorena Dew, FNP  Chief Complaint  Patient presents with  . Follow-up    sickle cell pain    Subjective:  HPI Alejandro Soto is a 31 y.o. male with HB-SS sickle cell disease, presents for routine medication management. Charle reports overall stable health. No recent hospital admissions or ED. Reports daily chronic pain which is manageable with current prescribed pain medication. Typical sickle pain occurs in his back. Current pain intensity 6/10. Reports occasional headaches which have occurred chronically since childhood. He is unaware of events that precipitate headaches, often he doesn't take medication as they are relieved with rest. He is overdue for an eye exam and reports some decrease in visual acuity. He denies chest pain, SOB, dizziness, weakness, dysuria, abdominal pain, or cough. Social History   Socioeconomic History  . Marital status: Single    Spouse name: Not on file  . Number of children: Not on file  . Years of education: Not on file  . Highest education level: Not on file  Social Needs  . Financial resource strain: Not on file  . Food insecurity - worry: Not on file  . Food insecurity - inability: Not on file  . Transportation needs - medical: Not on file  . Transportation needs - non-medical: Not on file  Occupational History  . Not on file  Tobacco Use  . Smoking status: Current Some Day Smoker    Packs/day: 0.25    Years: 5.00    Pack years: 1.25    Types: Cigarettes  . Smokeless tobacco: Never Used  Substance and Sexual Activity  . Alcohol use: No    Alcohol/week: 0.0 oz  . Drug use: No  . Sexual activity: Yes  Other Topics Concern  . Not on file  Social History Narrative  . Not on file    Family History  Problem Relation Age of Onset  . Cancer Maternal Grandmother        colon   . Diabetes Father   . Cancer Father 52       Prostate  . Asthma Brother     Review of Systems Pertinent negatives listed in HPI Patient Active Problem List   Diagnosis Date Noted  . 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 Chronic back pain greater than 3 months duration 04/09/2015  . Sickle cell pain crisis ([place]) 12/24/2014  . Abdominal pain, epigastric 12/24/2014  . Thrombocytosis ([place]) 07/14/2014  . Leukocytosis 07/14/2014  . Anemia 07/14/2014  . Sore throat 07/03/2014  . Sebaceous cyst 07/03/2014  . Atopic dermatitis 06/12/2014  . Tobacco dependence 04/25/2014  . Neck pain, bilateral 03/01/2014  . Hb-SS disease without crisis ([place]) 02/22/2014  . DJD (degenerative joint disease), lumbosacral 09/18/2013  . Vitamin D deficiency 09/18/2013  . Neuropathic pain 09/18/2013  . Back pain, acute 08/11/2013  . Opiate use 05/24/2013  . Chronic pain syndrome 05/24/2013  . Sickle cell disease, type SS ([place]) 05/24/2013  . Back pain 11/01/2012  . Pain in joint, shoulder region 11/01/2012  . Pain in joint, lower leg 11/01/2012    Allergies  Allergen Reactions  . Amoxicillin Diarrhea    Prior to Admission medications   Medication Sig Start Date End Date Taking? Authorizing Provider  folic acid (FOLVITE) 1 MG tablet Take 1 tablet (1 mg total) by mouth daily. 08/14/16   Dorena Dew, FNP  gabapentin (NEURONTIN) 300 MG capsule Take  1 capsule (300 mg total) by mouth 2 (two) times daily. 12/16/16   Dorena Dew, FNP  hydroxyurea (HYDREA) 500 MG capsule Take 3 capsules (1,500 mg total) by mouth daily. May take with food to minimize GI side effects. 10/13/16   Dorena Dew, FNP  ibuprofen (ADVIL,MOTRIN) 600 MG tablet Take 1 tablet (600 mg total) by mouth every 8 (eight) hours as needed for mild pain or moderate pain. 10/30/14   Dorena Dew, FNP  L-glutamine (ENDARI) 5 g PACK Powder Packet Take 10 g by mouth 2 (two) times daily. Patient not taking: Reported on 04/19/2017 12/16/16   Dorena Dew, FNP  Multiple Vitamin (MULTIVITAMIN) tablet Take 2 tablets  by mouth daily.     [provider]  Oxycodone HCl 20 MG TABS Take 1 tablet (20 mg total) by mouth every 6 (six) hours as needed. 08/05/17   Dorena Dew, FNP    Past Medical, Surgical Family and Social History reviewed and updated.    Objective:   Today's Vitals   08/30/17 1118  BP: 118/68  Pulse: 72  Resp: 14  Temp: 97.9 F (36.6 C)  TempSrc: Oral  SpO2: 100%  Weight: 162 lb (73.5 kg)  Height: 5\' 7"  (1.702 m)    Wt Readings from Last 3 Encounters:  08/30/17 162 lb (73.5 kg)  06/23/17 157 lb (71.2 kg)  04/19/17 154 lb (69.9 kg)    Physical Exam Constitutional: Patient appears well-developed and well-nourished. No distress. HENT: Normocephalic, atraumatic, External right and left ear normal. Oropharynx is clear and moist.  Eyes: Conjunctivae and EOM are normal. PERRLA, positive scleral icterus. Neck: Normal ROM. Neck supple. No JVD. No tracheal deviation. No thyromegaly. CVS: RRR, S1/S2 +, no murmurs, no gallops, no carotid bruit.  Pulmonary: Effort and breath sounds normal, no stridor, rhonchi, wheezes, rales.  Abdominal: Soft. BS +, no distension, tenderness, rebound or guarding.  Musculoskeletal: Normal range of motion. No edema and no tenderness Neuro: Alert. Normal reflexes, muscle tone coordination. Skin: Skin is warm and dry. No rash noted. Not diaphoretic. No erythema. No pallor. Psychiatric: Normal mood and affect. Behavior, judgment, thought content normal.   Assessment & Plan:  1. Hb-SS disease without crisis ([place]) Continue Hydrea. We discussed the need for good hydration, monitoring of hydration status, avoidance of heat, cold, stress, and infection triggers. We discussed the risks and benefits of Hydrea, including bone marrow suppression, the possibility of GI upset, skin ulcers, hair thinning, and teratogenicity. The patient was reminded of the need to seek medical attention for any symptoms of bleeding, anemia, or infection. Continue folic acid 1  mg daily to prevent aplastic bone marrow crises.   Pulmonary evaluation - Patient denies severe recurrent wheezes, shortness of breath with exercise, or persistent cough. If these symptoms develop, pulmonary function tests with spirometry will be ordered, and if abnormal, plan on referral to Pulmonology for further evaluation.  Eye - High risk of proliferative retinopathy. Annual eye exam with retinal exam recommended to patient, the patient has had eye exam this year.  Immunization status - Yearly influenza vaccination is recommended, as well as being up to date with Meningococcal and Pneumococcal vaccines.   2. Chronic, continuous use of opioids UDS pending Acute and chronic painful episodes - We agreed on Opiate dose and amount of pills  per month. We discussed that pt is to receive Schedule II prescriptions only from our clinic. Pt is also aware that the prescription history is available to [place] online  through the [place]. Controlled substance agreement reviewed and signed. We reminded  .[name] that all patients receiving Schedule II narcotics must be seen for follow within one month of prescription being requested. We reviewed the terms of our pain agreement, including the need to keep medicines in a safe locked location away from children or pets, and the need to report excess sedation or constipation, measures to avoid constipation, and policies related to early refills and stolen prescriptions. According to the [place] Chronic Pain Initiative program, we have reviewed details related to analgesia, adverse effects and aberrant behaviors.    3. Reduced visual acuity, overdue for retinanopathy exam  - Ambulatory referral to Ophthalmology  Meds ordered this encounter  Medications  . Oxycodone HCl 20 MG TABS    Sig: Take 1 tablet (20 mg total) by mouth every 6 (six) hours as needed.    Dispense:  90 tablet    Refill:  0    Rx not to be filled prior to 09/05/2017    Order Specific Question:    Supervising Provider    Answer:   Tresa Garter [[id number]]   Orders Placed This Encounter  Procedures  . [id number] 11+Oxyco+Alc+Crt-Bund  . CBC with Differential  . Reticulocytes  . Comprehensive metabolic panel  . Cannabinoid Conf, Ur  . Ambulatory referral to Ophthalmology  . POCT urinalysis dip (device)     Return in about 6 weeks for Sickle Cell Disease/Pain.  The patient was given clear instructions to go to ER or return to medical center if symptoms don't improve, worsen or new problems develop. The patient verbalized understanding. The patient was told to call to get lab results if they haven't heard anything in the next week.        Carroll Sage. Kenton Kingfisher, MSN, FNP-C [facility]  [address] Barbara Cower [place], [place] [address] [phone]

## 2017-08-31 LAB — CBC WITH DIFFERENTIAL/PLATELET
BASOS ABS: 0 10*3/uL (ref 0.0–0.2)
Basos: 0 %
EOS (ABSOLUTE): 0.4 10*3/uL (ref 0.0–0.4)
Eos: 4 %
Hematocrit: 33.4 % — ABNORMAL LOW (ref 37.5–51.0)
Hemoglobin: 11.6 g/dL — ABNORMAL LOW (ref 13.0–17.7)
IMMATURE GRANS (ABS): 0 10*3/uL (ref 0.0–0.1)
Immature Granulocytes: 0 %
LYMPHS ABS: 4.2 10*3/uL — AB (ref 0.7–3.1)
LYMPHS: 34 %
MCH: 32 pg (ref 26.6–33.0)
MCHC: 34.7 g/dL (ref 31.5–35.7)
MCV: 92 fL (ref 79–97)
MONOCYTES: 10 %
Monocytes Absolute: 1.2 10*3/uL — ABNORMAL HIGH (ref 0.1–0.9)
NEUTROS ABS: 6.3 10*3/uL (ref 1.4–7.0)
Neutrophils: 52 %
PLATELETS: 229 10*3/uL (ref 150–379)
RBC: 3.63 x10E6/uL — AB (ref 4.14–5.80)
RDW: 16.5 % — ABNORMAL HIGH (ref 12.3–15.4)
WBC: 12.2 10*3/uL — AB (ref 3.4–10.8)

## 2017-08-31 LAB — COMPREHENSIVE METABOLIC PANEL
A/G RATIO: 1.7 (ref 1.2–2.2)
ALT: 20 IU/L (ref 0–44)
AST: 36 IU/L (ref 0–40)
Albumin: 4.9 g/dL (ref 3.5–5.5)
Alkaline Phosphatase: 64 IU/L (ref 39–117)
BILIRUBIN TOTAL: 1 mg/dL (ref 0.0–1.2)
BUN/Creatinine Ratio: 8 — ABNORMAL LOW (ref 9–20)
BUN: 6 mg/dL (ref 6–20)
CHLORIDE: 103 mmol/L (ref 96–106)
CO2: 23 mmol/L (ref 20–29)
Calcium: 9.2 mg/dL (ref 8.7–10.2)
Creatinine, Ser: 0.75 mg/dL — ABNORMAL LOW (ref 0.76–1.27)
GFR calc Af Amer: 141 mL/min/{1.73_m2} (ref >59)
GFR calc non Af Amer: 122 mL/min/{1.73_m2} (ref >59)
GLUCOSE: 70 mg/dL (ref 65–99)
Globulin, Total: 2.9 g/dL (ref 1.5–4.5)
POTASSIUM: 4.5 mmol/L (ref 3.5–5.2)
Sodium: 141 mmol/L (ref 134–144)
Total Protein: 7.8 g/dL (ref 6.0–8.5)

## 2017-08-31 LAB — RETICULOCYTES: RETIC CT PCT: 5 % — AB (ref 0.6–2.6)

## 2017-09-02 ENCOUNTER — Telehealth: Payer: Self-pay

## 2017-09-02 LAB — DRUG SCREEN 764883 11+OXYCO+ALC+CRT-BUND
AMPHETAMINES, URINE: NEGATIVE ng/mL
BENZODIAZ UR QL: NEGATIVE ng/mL
Barbiturate: NEGATIVE ng/mL
CREATININE: 111.6 mg/dL (ref 20.0–300.0)
Cocaine (Metabolite): NEGATIVE ng/mL
Ethanol: NEGATIVE %
METHADONE SCREEN, URINE: NEGATIVE ng/mL
Meperidine: NEGATIVE ng/mL
OPIATE SCREEN URINE: NEGATIVE ng/mL
OXYCODONE+OXYMORPHONE UR QL SCN: NEGATIVE ng/mL
PH OF URINE: 7 (ref 4.5–8.9)
PHENCYCLIDINE: NEGATIVE ng/mL
Propoxyphene: NEGATIVE ng/mL
TRAMADOL: NEGATIVE ng/mL

## 2017-09-02 LAB — CANNABINOID CONFIRMATION, UR
CANNABINOIDS: POSITIVE — AB
CARBOXY THC GC/MS CONF: 26 ng/mL

## 2017-09-04 ENCOUNTER — Encounter: Payer: Self-pay | Admitting: Family Medicine

## 2017-09-23 ENCOUNTER — Telehealth: Payer: Self-pay

## 2017-09-27 ENCOUNTER — Other Ambulatory Visit: Payer: Self-pay | Admitting: Family Medicine

## 2017-09-27 DIAGNOSIS — D571 Sickle-cell disease without crisis: Secondary | ICD-10-CM

## 2017-09-27 DIAGNOSIS — Z79891 Long term (current) use of opiate analgesic: Secondary | ICD-10-CM

## 2017-09-27 MED ORDER — OXYCODONE HCL 20 MG PO TABS: 1.0000 | ORAL_TABLET | Freq: Four times a day (QID) | ORAL | 0 refills | Status: DC | PRN

## 2017-09-27 NOTE — Progress Notes (Signed)
Reviewed [place] Substance Reporting system prior to prescribing opiate medications. No inconsistencies noted.   Meds ordered this encounter  Medications  . Oxycodone HCl 20 MG TABS    Sig: Take 1 tablet (20 mg total) by mouth every 6 (six) hours as needed.    Dispense:  90 tablet    Refill:  0    Order Specific Question:   Supervising Provider    Answer:   Tresa Garter [[id number]]    Donia Pounds  MSN, FNP-C [facility] [address] [place], [place] [address] [phone]

## 2017-09-28 MED FILL — oxyCODONE HCL 20 MG TABS: 20 | 23 days supply | Qty: 90 | Fill #0

## 2017-10-18 ENCOUNTER — Other Ambulatory Visit: Payer: Self-pay | Admitting: Family Medicine

## 2017-10-18 ENCOUNTER — Telehealth: Payer: Self-pay

## 2017-10-18 DIAGNOSIS — D571 Sickle-cell disease without crisis: Secondary | ICD-10-CM

## 2017-10-18 DIAGNOSIS — Z79891 Long term (current) use of opiate analgesic: Secondary | ICD-10-CM

## 2017-10-18 MED ORDER — OXYCODONE HCL 20 MG PO TABS: 1.0000 | ORAL_TABLET | Freq: Four times a day (QID) | ORAL | 0 refills | Status: DC | PRN

## 2017-10-18 NOTE — Progress Notes (Signed)
Reviewed [place] Substance Reporting system prior to prescribing opiate medications. No inconsistencies noted.   Meds ordered this encounter  Medications  . Oxycodone HCl 20 MG TABS    Sig: Take 1 tablet (20 mg total) by mouth every 6 (six) hours as needed.    Dispense:  90 tablet    Refill:  0    Rx not to be filled prior to 10/21/2017    Order Specific Question:   Supervising Provider    Answer:   Tresa Garter [[id number]]    Donia Pounds  MSN, FNP-C [facility] [address] [place], [place] [address] [phone]

## 2017-10-22 MED FILL — oxyCODONE HCL 20 MG TABS: 20 | 22 days supply | Qty: 90 | Fill #0

## 2017-11-01 ENCOUNTER — Encounter: Payer: Self-pay | Admitting: Family Medicine

## 2017-11-01 ENCOUNTER — Ambulatory Visit (INDEPENDENT_AMBULATORY_CARE_PROVIDER_SITE_OTHER): Payer: Medicare Other | Admitting: Family Medicine

## 2017-11-01 VITALS — BP 122/68 | HR 82 | Temp 99.6°F | Ht 67.0 in | Wt 157.0 lb

## 2017-11-01 DIAGNOSIS — F411 Generalized anxiety disorder: Secondary | ICD-10-CM | POA: Diagnosis not present

## 2017-11-01 DIAGNOSIS — D571 Sickle-cell disease without crisis: Secondary | ICD-10-CM

## 2017-11-01 LAB — POCT URINALYSIS DIP (MANUAL ENTRY)
BILIRUBIN UA: NEGATIVE mg/dL
Bilirubin, UA: NEGATIVE
Blood, UA: NEGATIVE
Glucose, UA: NEGATIVE mg/dL
LEUKOCYTES UA: NEGATIVE
NITRITE UA: NEGATIVE
Protein Ur, POC: NEGATIVE mg/dL
Spec Grav, UA: 1.015 (ref 1.010–1.025)
Urobilinogen, UA: 1 E.U./dL
pH, UA: 8.5 — AB (ref 5.0–8.0)

## 2017-11-01 MED ORDER — BUSPIRONE HCL 5 MG PO TABS: 5.0000 mg | ORAL_TABLET | Freq: Two times a day (BID) | ORAL | 1 refills | Status: DC

## 2017-11-01 MED FILL — busPIRone HCL 5 MG TABS: 5 | 15 days supply | Qty: 30 | Fill #0

## 2017-11-01 NOTE — Progress Notes (Signed)
Chief Complaint  Patient presents with  . Follow-up    sickle cell    Subjective:    Patient ID: Alejandro Soto, male    DOB: Apr 21, 1986, 32 y.o.   MRN: [id number]  HPI Brown Dunlap, a 32 year old male with a history of sickle cell anemia, HbSS presents for a follow up and medication management. Patient has chronic pain related to sickle cell anemia. Pain is mostly to lower back and lower extremities. Pain intensity is currently 6/10 characterized as constant and sharp. He last had oxycodone this am with moderate relief. Patient denies headache, chest pain, nausea, vomiting, or diarrhea.   Landon also complains of anxiety disorder.  He has the following symptoms: difficulty concentrating, feelings of losing control, irritable, racing thoughts. Onset of symptoms was approximately several years ago. He feels that feelings of anxiety have increased over the past several month due to previous living situation and having to move abruptly. He says that overall irritability has increased. He denies current suicidal and homicidal ideation.  Risk factors: previous episode of depression and personality disorder. Patient was diagnosed with a schizophrenia many years ago. He says that he has not been on medications or followed by psychiatry.  He denies visual or auditory hallucinations.   Past Medical History:  Diagnosis Date  . Pneumonia   . Sickle cell anemia (HCC)    Social History   Socioeconomic History  . Marital status: Single    Spouse name: Not on file  . Number of children: Not on file  . Years of education: Not on file  . Highest education level: Not on file  Occupational History  . Not on file  Social Needs  . Financial resource strain: Not on file  . Food insecurity:    Worry: Not on file    Inability: Not on file  . Transportation needs:    Medical: Not on file    Non-medical: Not on file  Tobacco Use  . Smoking status: Current Some Day Smoker    Packs/day: 0.25    Years: 5.00     Pack years: 1.25    Types: Cigarettes  . Smokeless tobacco: Never Used  Substance and Sexual Activity  . Alcohol use: No    Alcohol/week: 0.0 oz  . Drug use: No  . Sexual activity: Yes  Lifestyle  . Physical activity:    Days per week: Not on file    Minutes per session: Not on file  . Stress: Not on file  Relationships  . Social connections:    Talks on phone: Not on file    Gets together: Not on file    Attends religious service: Not on file    Active member of club or organization: Not on file    Attends meetings of clubs or organizations: Not on file    Relationship status: Not on file  . Intimate partner violence:    Fear of current or ex partner: Not on file    Emotionally abused: Not on file    Physically abused: Not on file    Forced sexual activity: Not on file  Other Topics Concern  . Not on file  Social History Narrative  . Not on file   Immunization History  Administered Date(s) Administered  . Influenza Whole 03/21/2012  . Influenza,inj,Quad PF,6+ Mos 03/21/2013, 03/01/2014  . Pneumococcal Conjugate-13 03/21/2012  . Pneumococcal Polysaccharide-23 07/15/2014  . Tdap 08/14/2016    Review of Systems  Constitutional: Negative.   HENT: Negative.  Eyes: Negative for photophobia, redness and visual disturbance.  Respiratory: Negative.   Cardiovascular: Negative.   Endocrine: Negative for polydipsia, polyphagia and polyuria.  Genitourinary: Negative.   Musculoskeletal: Positive for arthralgias and joint swelling.  Skin: Negative.   Allergic/Immunologic: Negative.   Neurological: Negative.   Psychiatric/Behavioral: Positive for agitation. The patient is nervous/anxious.       Objective:   Physical Exam  Constitutional: He appears well-developed and well-nourished.  HENT:  Head: Normocephalic and atraumatic.  Right Ear: External ear normal.  Left Ear: External ear normal.  Eyes: Pupils are equal, round, and reactive to light.  Cardiovascular:  Normal rate, regular rhythm and intact distal pulses.  Murmur heard. Pulmonary/Chest: Effort normal and breath sounds normal.  Abdominal: Soft. Bowel sounds are normal.  Musculoskeletal: Normal range of motion.  Neurological: He is alert.  Skin: Skin is warm.  Psychiatric: His behavior is normal. Judgment and thought content normal. His mood appears anxious.      BP 122/68 (BP Location: Left Arm, Patient Position: Sitting, Cuff Size: Small)   Pulse 82   Temp 99.6 F (37.6 C) (Oral)   Ht 5\' 7"  (1.702 m)   Wt 157 lb (71.2 kg)   SpO2 97%   BMI 24.59 kg/m  Assessment & Plan:  1. Hb-SS disease without crisis ([place]) Continue Hydrea at current dosage. We discussed the need for good hydration, monitoring of hydration status, avoidance of heat, cold, stress, and infection triggers. We discussed the risks and benefits of Hydrea, including bone marrow suppression, the possibility of GI upset, skin ulcers, hair thinning, and teratogenicity. The patient was reminded of the need to seek medical attention of any symptoms of bleeding, anemia, or infection. Continue folic acid 1 mg daily to prevent aplastic bone marrow crises.   Pulmonary evaluation - Patient denies severe recurrent wheezes, shortness of breath with exercise, or persistent cough. If these symptoms develop, pulmonary function tests with spirometry will be ordered, and if abnormal, plan on referral to Pulmonology for further evaluation.. Cardiac - Routine screening for pulmonary hypertension is not recommended.  Eye - High risk of proliferative retinopathy. Annual eye exam with retinal exam recommended to patient.  Immunization status - She declines vaccines today. Yearly influenza vaccination is recommended, as well as being up to date with Meningococcal and Pneumococcal vaccines.   Acute and chronic painful episodes - We agreed on current opiate medication regimen.  We discussed that pt is to receive his Schedule II prescriptions only  from [place]. Pt is also aware that the prescription history is available to [place] online through the [facility] CSRS. Controlled substance agreement signed previously.  We reminded Usiel that all patients receiving Schedule II narcotics must be seen for follow within one month of prescription being requested. We reviewed the terms of our pain agreement, including the need to keep medicines in a safe locked location away from children or pets, and the need to report excess sedation or constipation, measures to avoid constipation, and policies related to early refills and stolen prescriptions. According to the [place] Chronic Pain Initiative program, we have reviewed details related to analgesia, adverse effects, aberrant behaviors.  - POCT urinalysis dipstick - Comprehensive metabolic panel - CBC with Differential - Reticulocytes - 660630 9+OXYCODONE+CRT-UNBUND; Future - 160109 9+OXYCODONE+CRT-UNBUND  2. Generalized anxiety disorder GAD 7 : Generalized Anxiety Score 11/01/2017  Nervous, Anxious, on Edge 2  Control/stop worrying 3  Worry too much - different things 3  Trouble relaxing 1  Restless 0  Easily annoyed  or irritable 2  Afraid - awful might happen 1  Total GAD 7 Score 12  Anxiety Difficulty Somewhat difficult    - busPIRone (BUSPAR) 5 MG tablet; Take 1 tablet (5 mg total) by mouth 2 (two) times daily.  Dispense: 30 tablet; Refill: 1 - Ambulatory referral to Psychiatry   RTC: 1 month for medication management  Donia Pounds  MSN, FNP-C [facility] [address] [place], [place] [address] [phone]

## 2017-11-01 NOTE — Progress Notes (Signed)
poct

## 2017-11-01 NOTE — Patient Instructions (Addendum)
Will start a trial of Buspar 5 mg twice daily for generalized anxiety.  Sent a referral to psychiatry.      Generalized Anxiety Disorder, Adult Generalized anxiety disorder (GAD) is a mental health disorder. People with this condition constantly worry about everyday events. Unlike normal anxiety, worry related to GAD is not triggered by a specific event. These worries also do not fade or get better with time. GAD interferes with life functions, including relationships, work, and school. GAD can vary from mild to severe. People with severe GAD can have intense waves of anxiety with physical symptoms (panic attacks). What are the causes? The exact cause of GAD is not known. What increases the risk? This condition is more likely to develop in:  Women.  People who have a family history of anxiety disorders.  People who are very shy.  People who experience very stressful life events, such as the death of a loved one.  People who have a very stressful family environment.  What are the signs or symptoms? People with GAD often worry excessively about many things in their lives, such as their health and family. They may also be overly concerned about:  Doing well at work.  Being on time.  Natural disasters.  Friendships.  Physical symptoms of GAD include:  Fatigue.  Muscle tension or having muscle twitches.  Trembling or feeling shaky.  Being easily startled.  Feeling like your heart is pounding or racing.  Feeling out of breath or like you cannot take a deep breath.  Having trouble falling asleep or staying asleep.  Sweating.  Nausea, diarrhea, or irritable bowel syndrome (IBS).  Headaches.  Trouble concentrating or remembering facts.  Restlessness.  Irritability.  How is this diagnosed? Your health care provider can diagnose GAD based on your symptoms and medical history. You will also have a physical exam. The health care provider will ask specific questions  about your symptoms, including how severe they are, when they started, and if they come and go. Your health care provider may ask you about your use of alcohol or drugs, including prescription medicines. Your health care provider may refer you to a mental health specialist for further evaluation. Your health care provider will do a thorough examination and may perform additional tests to rule out other possible causes of your symptoms. To be diagnosed with GAD, a person must have anxiety that:  Is out of his or her control.  Affects several different aspects of his or her life, such as work and relationships.  Causes distress that makes him or her unable to take part in normal activities.  Includes at least three physical symptoms of GAD, such as restlessness, fatigue, trouble concentrating, irritability, muscle tension, or sleep problems.  Before your health care provider can confirm a diagnosis of GAD, these symptoms must be present more days than they are not, and they must last for six months or longer. How is this treated? The following therapies are usually used to treat GAD:  Medicine. Antidepressant medicine is usually prescribed for long-term daily control. Antianxiety medicines may be added in severe cases, especially when panic attacks occur.  Talk therapy (psychotherapy). Certain types of talk therapy can be helpful in treating GAD by providing support, education, and guidance. Options include: ? Cognitive behavioral therapy (CBT). People learn coping skills and techniques to ease their anxiety. They learn to identify unrealistic or negative thoughts and behaviors and to replace them with positive ones. ? Acceptance and commitment therapy (ACT). This  treatment teaches people how to be mindful as a way to cope with unwanted thoughts and feelings. ? Biofeedback. This process trains you to manage your body's response (physiological response) through breathing techniques and relaxation  methods. You will work with a therapist while machines are used to monitor your physical symptoms.  Stress management techniques. These include yoga, meditation, and exercise.  A mental health specialist can help determine which treatment is best for you. Some people see improvement with one type of therapy. However, other people require a combination of therapies. Follow these instructions at home:  Take over-the-counter and prescription medicines only as told by your health care provider.  Try to maintain a normal routine.  Try to anticipate stressful situations and allow extra time to manage them.  Practice any stress management or self-calming techniques as taught by your health care provider.  Do not punish yourself for setbacks or for not making progress.  Try to recognize your accomplishments, even if they are small.  Keep all follow-up visits as told by your health care provider. This is important. Contact a health care provider if:  Your symptoms do not get better.  Your symptoms get worse.  You have signs of depression, such as: ? A persistently sad, cranky, or irritable mood. ? Loss of enjoyment in activities that used to bring you joy. ? Change in weight or eating. ? Changes in sleeping habits. ? Avoiding friends or family members. ? Loss of energy for normal tasks. ? Feelings of guilt or worthlessness. Get help right away if:  You have serious thoughts about hurting yourself or others. If you ever feel like you may hurt yourself or others, or have thoughts about taking your own life, get help right away. You can go to your nearest emergency department or call:  Your local emergency services (911 in the [place]  A suicide crisis helpline, such as the [facility] at 9095706959. This is open 24 hours a day.  Summary  Generalized anxiety disorder (GAD) is a mental health disorder that involves worry that is not triggered by a specific  event.  People with GAD often worry excessively about many things in their lives, such as their health and family.  GAD may cause physical symptoms such as restlessness, trouble concentrating, sleep problems, frequent sweating, nausea, diarrhea, headaches, and trembling or muscle twitching.  A mental health specialist can help determine which treatment is best for you. Some people see improvement with one type of therapy. However, other people require a combination of therapies. This information is not intended to replace advice given to you by your health care provider. Make sure you discuss any questions you have with your health care provider. Document Released: 10/03/2012 Document Revised: 04/28/2016 Document Reviewed: 04/28/2016 Elsevier Interactive Patient Education  [employer].

## 2017-11-02 ENCOUNTER — Telehealth: Payer: Self-pay

## 2017-11-02 LAB — CBC WITH DIFFERENTIAL/PLATELET
BASOS: 0 %
Basophils Absolute: 0.1 10*3/uL (ref 0.0–0.2)
EOS (ABSOLUTE): 0.3 10*3/uL (ref 0.0–0.4)
Eos: 2 %
HEMATOCRIT: 31 % — AB (ref 37.5–51.0)
HEMOGLOBIN: 10.6 g/dL — AB (ref 13.0–17.7)
IMMATURE GRANULOCYTES: 0 %
Immature Grans (Abs): 0 10*3/uL (ref 0.0–0.1)
Lymphocytes Absolute: 5.3 10*3/uL — ABNORMAL HIGH (ref 0.7–3.1)
Lymphs: 35 %
MCH: 30.3 pg (ref 26.6–33.0)
MCHC: 34.2 g/dL (ref 31.5–35.7)
MCV: 89 fL (ref 79–97)
MONOCYTES: 8 %
MONOS ABS: 1.2 10*3/uL — AB (ref 0.1–0.9)
NEUTROS PCT: 55 %
NRBC: 2 % — AB (ref 0–0)
Neutrophils Absolute: 8.3 10*3/uL — ABNORMAL HIGH (ref 1.4–7.0)
PLATELETS: 352 10*3/uL (ref 150–379)
RBC: 3.5 x10E6/uL — ABNORMAL LOW (ref 4.14–5.80)
RDW: 18.1 % — AB (ref 12.3–15.4)
WBC: 15.2 10*3/uL — ABNORMAL HIGH (ref 3.4–10.8)

## 2017-11-02 LAB — COMPREHENSIVE METABOLIC PANEL
ALBUMIN: 4.9 g/dL (ref 3.5–5.5)
ALT: 18 IU/L (ref 0–44)
AST: 32 IU/L (ref 0–40)
Albumin/Globulin Ratio: 2.1 (ref 1.2–2.2)
Alkaline Phosphatase: 55 IU/L (ref 39–117)
BUN / CREAT RATIO: 7 — AB (ref 9–20)
BUN: 5 mg/dL — AB (ref 6–20)
Bilirubin Total: 0.9 mg/dL (ref 0.0–1.2)
CO2: 21 mmol/L (ref 20–29)
CREATININE: 0.75 mg/dL — AB (ref 0.76–1.27)
Calcium: 9.5 mg/dL (ref 8.7–10.2)
Chloride: 102 mmol/L (ref 96–106)
GFR calc Af Amer: 141 mL/min/{1.73_m2} (ref >59)
GFR calc non Af Amer: 122 mL/min/{1.73_m2} (ref >59)
GLOBULIN, TOTAL: 2.3 g/dL (ref 1.5–4.5)
Glucose: 86 mg/dL (ref 65–99)
Potassium: 3.9 mmol/L (ref 3.5–5.2)
SODIUM: 142 mmol/L (ref 134–144)
Total Protein: 7.2 g/dL (ref 6.0–8.5)

## 2017-11-02 LAB — RETICULOCYTES: RETIC CT PCT: 10 % — AB (ref 0.6–2.6)

## 2017-11-02 NOTE — Telephone Encounter (Signed)
Called, no answer. Left a message for patient that all labs are at baseline and no changes are needed at this time. Advised to call back if any questions and keep next scheduled appointment if not. Thanks!

## 2017-11-02 NOTE — Telephone Encounter (Signed)
-----   Message from Dorena Dew, [place] sent at 11/02/2017  8:58 AM EDT ----- Regarding: lab results Please inform patient that all labs are consistently with baseline. No medication adjustments warranted at this time.  Advise patient to follow up in office as scheduled.   Donia Pounds  MSN, FNP-C [facility] [address] [place], [place] [address] [phone]

## 2017-11-08 LAB — 737588 9+OXYCODONE+CRT-UNBUND
Amphetamine Scrn, Ur: NEGATIVE ng/mL
BARBITURATE SCREEN URINE: NEGATIVE ng/mL
BENZODIAZEPINE SCREEN, URINE: NEGATIVE ng/mL
Cocaine (Metab) Scrn, Ur: NEGATIVE ng/mL
Creatinine(Crt), U: 66 mg/dL (ref 20.0–300.0)
Methadone Screen, Urine: NEGATIVE ng/mL
OXYCODONE+OXYMORPHONE UR QL SCN: NEGATIVE ng/mL
Opiate Scrn, Ur: NEGATIVE ng/mL
Ph of Urine: 8.2 (ref 4.5–8.9)
Phencyclidine Qn, Ur: NEGATIVE ng/mL
Propoxyphene Scrn, Ur: NEGATIVE ng/mL

## 2017-11-08 LAB — CANNABINOID (GC/MS), URINE
Cannabinoid: POSITIVE — AB
Carboxy THC (GC/MS): 104 ng/mL

## 2017-11-09 ENCOUNTER — Telehealth: Payer: Self-pay

## 2017-11-10 ENCOUNTER — Other Ambulatory Visit: Payer: Self-pay | Admitting: Family Medicine

## 2017-11-10 DIAGNOSIS — D571 Sickle-cell disease without crisis: Secondary | ICD-10-CM

## 2017-11-10 DIAGNOSIS — Z79891 Long term (current) use of opiate analgesic: Secondary | ICD-10-CM

## 2017-11-10 MED ORDER — OXYCODONE HCL 20 MG PO TABS
1.0000 | ORAL_TABLET | Freq: Four times a day (QID) | ORAL | 0 refills | Status: DC | PRN
Start: 2017-11-10 — End: 2017-11-29

## 2017-11-10 NOTE — Progress Notes (Signed)
Reviewed [facility] Substance Reporting system prior to prescribing opiate medications. No inconsistencies noted.   Meds ordered this encounter  Medications  . Oxycodone HCl 20 MG TABS    Sig: Take 1 tablet (20 mg total) by mouth every 6 (six) hours as needed.    Dispense:  90 tablet    Refill:  0    Rx not to be filled prior to 11/12/2017    Order Specific Question:   Supervising Provider    Answer:   Tresa Garter [[id number]]    Donia Pounds  MSN, FNP-C Patient [facility] [address] [place], [place] [address] [phone]

## 2017-11-12 MED FILL — oxyCODONE HCL 20 MG TABS: 20 | 23 days supply | Qty: 90 | Fill #0

## 2017-11-29 ENCOUNTER — Ambulatory Visit (INDEPENDENT_AMBULATORY_CARE_PROVIDER_SITE_OTHER): Payer: Medicare Other | Admitting: Family Medicine

## 2017-11-29 DIAGNOSIS — F411 Generalized anxiety disorder: Secondary | ICD-10-CM

## 2017-11-29 DIAGNOSIS — Z79891 Long term (current) use of opiate analgesic: Secondary | ICD-10-CM

## 2017-11-29 DIAGNOSIS — D571 Sickle-cell disease without crisis: Secondary | ICD-10-CM | POA: Diagnosis not present

## 2017-11-29 MED ORDER — OXYCODONE HCL 20 MG PO TABS: 1.0000 | ORAL_TABLET | Freq: Four times a day (QID) | ORAL | 0 refills | Status: DC | PRN

## 2017-11-29 NOTE — Progress Notes (Signed)
Chief Complaint  Patient presents with  . Sickle Cell Anemia  . Headache    Subjective:    Patient ID: Alejandro Soto, male    DOB: [birth year], 32 y.o.   MRN: [id number]  HPI Alejandro Soto, a 32 year old male with a history of sickle cell anemia, HbSS presents for a follow up and medication management. Patient has chronic pain related to sickle cell anemia. Pain is mostly to lower back and lower extremities. Pain intensity is currently 5-6/10 characterized as constant and aching. He last had oxycodone this am with moderate relief. Patient denies headache, chest pain, nausea, vomiting, or diarrhea.   Rakan also following up on  anxiety disorder.  He has the following symptoms: difficulty concentrating, feelings of losing control, irritable, racing thoughts occasionally which have improved slightly. He denies current suicidal and homicidal ideation.  Risk factors: previous episode of depression and personality disorder. Patient was diagnosed with a schizophrenia many years ago. He has not established care with psychiatry.   He denies visual or auditory hallucinations.   Past Medical History:  Diagnosis Date  . Pneumonia   . Sickle cell anemia (HCC)    Social History   Socioeconomic History  . Marital status: Single    Spouse name: Not on file  . Number of children: Not on file  . Years of education: Not on file  . Highest education level: Not on file  Occupational History  . Not on file  Social Needs  . Financial resource strain: Not on file  . Food insecurity:    Worry: Not on file    Inability: Not on file  . Transportation needs:    Medical: Not on file    Non-medical: Not on file  Tobacco Use  . Smoking status: Current Some Day Smoker    Packs/day: 0.25    Years: 5.00    Pack years: 1.25    Types: Cigarettes  . Smokeless tobacco: Never Used  Substance and Sexual Activity  . Alcohol use: No    Alcohol/week: 0.0 oz  . Drug use: No  . Sexual activity: Yes  Lifestyle  .  Physical activity:    Days per week: Not on file    Minutes per session: Not on file  . Stress: Not on file  Relationships  . Social connections:    Talks on phone: Not on file    Gets together: Not on file    Attends religious service: Not on file    Active member of club or organization: Not on file    Attends meetings of clubs or organizations: Not on file    Relationship status: Not on file  . Intimate partner violence:    Fear of current or ex partner: Not on file    Emotionally abused: Not on file    Physically abused: Not on file    Forced sexual activity: Not on file  Other Topics Concern  . Not on file  Social History Narrative  . Not on file   Immunization History  Administered Date(s) Administered  . Influenza Whole 03/21/2012  . Influenza,inj,Quad PF,6+ Mos 03/21/2013, 03/01/2014  . Pneumococcal Conjugate-13 03/21/2012  . Pneumococcal Polysaccharide-23 07/15/2014  . Tdap 08/14/2016    Review of Systems  Constitutional: Negative.   HENT: Negative.   Eyes: Negative for photophobia, redness and visual disturbance.  Respiratory: Negative.   Cardiovascular: Negative.   Endocrine: Negative for polydipsia, polyphagia and polyuria.  Genitourinary: Negative.   Musculoskeletal: Positive for arthralgias and  joint swelling.  Skin: Negative.   Allergic/Immunologic: Negative.   Neurological: Positive for headaches.  Psychiatric/Behavioral: Negative for agitation. The patient is not nervous/anxious.       Objective:   Physical Exam  Constitutional: He appears well-developed and well-nourished.  HENT:  Head: Normocephalic and atraumatic.  Right Ear: External ear normal.  Left Ear: External ear normal.  Eyes: Pupils are equal, round, and reactive to light.  Cardiovascular: Normal rate, regular rhythm and intact distal pulses.  Murmur heard. Pulmonary/Chest: Effort normal and breath sounds normal.  Abdominal: Soft. Bowel sounds are normal.  Musculoskeletal: Normal  range of motion.  Neurological: He is alert.  Skin: Skin is warm.  Psychiatric: His behavior is normal. Judgment and thought content normal.      BP 133/78 (BP Location: Left Arm, Patient Position: Sitting, Cuff Size: Normal)   Pulse 88   Temp 98.6 F (37 C) (Oral)   Resp 16   Ht 5\' 7"  (1.702 m)   Wt 165 lb (74.8 kg)   SpO2 100%   BMI 25.84 kg/m  Assessment & Plan:  1. Hb-SS disease without crisis ([place]) Continue Hydrea at current dosage. We discussed the need for good hydration, monitoring of hydration status, avoidance of heat, cold, stress, and infection triggers. We discussed the risks and benefits of Hydrea, including bone marrow suppression, the possibility of GI upset, skin ulcers, hair thinning, and teratogenicity. The patient was reminded of the need to seek medical attention of any symptoms of bleeding, anemia, or infection. Continue folic acid 1 mg daily to prevent aplastic bone marrow crises.   Pulmonary evaluation - Patient denies severe recurrent wheezes, shortness of breath with exercise, or persistent cough. If these symptoms develop, pulmonary function tests with spirometry will be ordered, and if abnormal, plan on referral to Pulmonology for further evaluation.. Cardiac - Routine screening for pulmonary hypertension is not recommended.  Eye - High risk of proliferative retinopathy. Annual eye exam with retinal exam recommended to patient.  Immunization status - She declines vaccines today. Yearly influenza vaccination is recommended, as well as being up to date with Meningococcal and Pneumococcal vaccines.   Acute and chronic painful episodes - We agreed on current opiate medication regimen.  We discussed that pt is to receive his Schedule II prescriptions only from [place]. Pt is also aware that the prescription history is available to [place] online through the [facility] CSRS. Controlled substance agreement signed previously.  We reminded [place] that all patients receiving Schedule II  narcotics must be seen for follow within one month of prescription being requested. We reviewed the terms of our pain agreement, including the need to keep medicines in a safe locked location away from children or pets, and the need to report excess sedation or constipation, measures to avoid constipation, and policies related to early refills and stolen prescriptions. According to the [place] Chronic Pain Initiative program, we have reviewed details related to analgesia, adverse effects, aberrant behaviors. - Oxycodone HCl 20 MG TABS; Take 1 tablet (20 mg total) by mouth every 6 (six) hours as needed.  Dispense: 90 tablet; Refill: 0 - Comprehensive metabolic panel - CBC with Differential - 161096 9+OXYCODONE+CRT-UNBUND  2. Chronic prescription opiate use Reviewed  Substance Reporting system prior to prescribing opiate medications. No inconsistencies noted.   - Oxycodone HCl 20 MG TABS; Take 1 tablet (20 mg total) by mouth every 6 (six) hours as needed.  Dispense: 90 tablet; Refill: 0 - 045409 9+OXYCODONE+CRT-UNBUND  3. Generalized anxiety disorder  -  busPIRone (BUSPAR) 5 MG tablet; Take 1 tablet (5 mg total) by mouth 2 (two) times daily.  Dispense: 30 tablet; Refill: 1   RTC: 1 month for sickle cell anemia and medication management. Will recheck laboratory values in 3 months.    Donia Pounds  MSN, FNP-C [facility] [address] [place],  [address] [phone]

## 2017-11-30 LAB — COMPREHENSIVE METABOLIC PANEL
A/G RATIO: 1.7 (ref 1.2–2.2)
ALT: 28 IU/L (ref 0–44)
AST: 35 IU/L (ref 0–40)
Albumin: 4.6 g/dL (ref 3.5–5.5)
Alkaline Phosphatase: 64 IU/L (ref 39–117)
BUN/Creatinine Ratio: 8 — ABNORMAL LOW (ref 9–20)
BUN: 6 mg/dL (ref 6–20)
Bilirubin Total: 1 mg/dL (ref 0.0–1.2)
CO2: 23 mmol/L (ref 20–29)
Calcium: 9.4 mg/dL (ref 8.7–10.2)
Chloride: 102 mmol/L (ref 96–106)
Creatinine, Ser: 0.74 mg/dL — ABNORMAL LOW (ref 0.76–1.27)
GFR calc non Af Amer: 123 mL/min/{1.73_m2} (ref >59)
GFR, EST AFRICAN AMERICAN: 142 mL/min/{1.73_m2} (ref >59)
Globulin, Total: 2.7 g/dL (ref 1.5–4.5)
Glucose: 63 mg/dL — ABNORMAL LOW (ref 65–99)
Potassium: 4.4 mmol/L (ref 3.5–5.2)
Sodium: 141 mmol/L (ref 134–144)
TOTAL PROTEIN: 7.3 g/dL (ref 6.0–8.5)

## 2017-11-30 LAB — CBC WITH DIFFERENTIAL/PLATELET
Basophils Absolute: 0.1 10*3/uL (ref 0.0–0.2)
Basos: 0 %
EOS (ABSOLUTE): 0.5 10*3/uL — AB (ref 0.0–0.4)
Eos: 3 %
HEMATOCRIT: 32.2 % — AB (ref 37.5–51.0)
Hemoglobin: 10.7 g/dL — ABNORMAL LOW (ref 13.0–17.7)
IMMATURE GRANS (ABS): 0.1 10*3/uL (ref 0.0–0.1)
Immature Granulocytes: 1 %
LYMPHS ABS: 6.2 10*3/uL — AB (ref 0.7–3.1)
LYMPHS: 35 %
MCH: 30.5 pg (ref 26.6–33.0)
MCHC: 33.2 g/dL (ref 31.5–35.7)
MCV: 92 fL (ref 79–97)
Monocytes Absolute: 1.4 10*3/uL — ABNORMAL HIGH (ref 0.1–0.9)
Monocytes: 8 %
NEUTROS ABS: 9.5 10*3/uL — AB (ref 1.4–7.0)
NRBC: 1 % — ABNORMAL HIGH (ref 0–0)
Neutrophils: 53 %
Platelets: 331 10*3/uL (ref 150–450)
RBC: 3.51 x10E6/uL — ABNORMAL LOW (ref 4.14–5.80)
RDW: 18.9 % — AB (ref 12.3–15.4)
WBC: 17.8 10*3/uL — ABNORMAL HIGH (ref 3.4–10.8)

## 2017-12-01 ENCOUNTER — Encounter: Payer: Self-pay | Admitting: Family Medicine

## 2017-12-01 MED ORDER — BUSPIRONE HCL 5 MG PO TABS: 5.0000 mg | ORAL_TABLET | Freq: Two times a day (BID) | ORAL | 1 refills | Status: DC

## 2017-12-07 LAB — 737588 9+OXYCODONE+CRT-UNBUND
Amphetamine Scrn, Ur: NEGATIVE ng/mL
BARBITURATE SCREEN URINE: NEGATIVE ng/mL
COCAINE(METAB.)SCREEN, URINE: NEGATIVE ng/mL
Creatinine(Crt), U: 134 mg/dL (ref 20.0–300.0)
METHADONE SCREEN, URINE: NEGATIVE ng/mL
OXYCODONE+OXYMORPHONE UR QL SCN: NEGATIVE ng/mL
PHENCYCLIDINE QUANTITATIVE URINE: NEGATIVE ng/mL
Ph of Urine: 7.4 (ref 4.5–8.9)
Propoxyphene Scrn, Ur: NEGATIVE ng/mL

## 2017-12-07 LAB — BENZODIAZEPINES CONFIRM, URINE
ALPRAZOLAM: NEGATIVE
BENZODIAZEPINES: NEGATIVE ng/mL
CLONAZEPAM: NEGATIVE
FLURAZEPAM UR: NEGATIVE
LORAZEPAM: NEGATIVE
MIDAZOLAM: NEGATIVE
NORDIAZEPAM: NEGATIVE
OXAZEPAM UR: NEGATIVE
TEMAZEPAM: NEGATIVE
TRIAZOLAM: NEGATIVE

## 2017-12-07 LAB — CANNABINOID (GC/MS), URINE
Cannabinoid: POSITIVE — AB
Carboxy THC (GC/MS): 45 ng/mL

## 2017-12-07 LAB — OPIATES CONFIRMATION, URINE
CODEINE: NEGATIVE
HYDROCODONE: NEGATIVE
HYDROMORPHONE: NEGATIVE
MORPHINE CONFIRM: 500 ng/mL
MORPHINE: POSITIVE — AB
OPIATES: POSITIVE ng/mL — AB

## 2017-12-21 ENCOUNTER — Telehealth: Payer: Self-pay

## 2017-12-22 ENCOUNTER — Other Ambulatory Visit: Payer: Self-pay | Admitting: Family Medicine

## 2017-12-22 DIAGNOSIS — Z79891 Long term (current) use of opiate analgesic: Secondary | ICD-10-CM

## 2017-12-22 DIAGNOSIS — D571 Sickle-cell disease without crisis: Secondary | ICD-10-CM

## 2017-12-22 MED ORDER — OXYCODONE HCL 20 MG PO TABS: 1.0000 | ORAL_TABLET | Freq: Four times a day (QID) | ORAL | 0 refills | Status: DC | PRN

## 2017-12-22 NOTE — Progress Notes (Signed)
Reviewed [facility] Substance Reporting system prior to prescribing opiate medications. No inconsistencies noted.   Meds ordered this encounter  Medications  . Oxycodone HCl 20 MG TABS    Sig: Take 1 tablet (20 mg total) by mouth every 6 (six) hours as needed.    Dispense:  90 tablet    Refill:  0    Rx not to be filled prior to 12/26/2017    Order Specific Question:   Supervising Provider    Answer:   Tresa Garter [[id number]]     Donia Pounds  MSN, FNP-C [facility] [address] [place], [place] [address] [phone]

## 2018-01-05 ENCOUNTER — Ambulatory Visit: Payer: Medicare Other | Admitting: Family Medicine

## 2018-01-14 ENCOUNTER — Telehealth: Payer: Self-pay

## 2018-01-15 DIAGNOSIS — M79604 Pain in right leg: Secondary | ICD-10-CM | POA: Diagnosis not present

## 2018-01-15 DIAGNOSIS — D57412 Sickle-cell thalassemia with splenic sequestration: Secondary | ICD-10-CM | POA: Diagnosis not present

## 2018-01-15 DIAGNOSIS — Z79899 Other long term (current) drug therapy: Secondary | ICD-10-CM | POA: Diagnosis not present

## 2018-01-15 DIAGNOSIS — Z880 Allergy status to penicillin: Secondary | ICD-10-CM | POA: Diagnosis not present

## 2018-01-15 DIAGNOSIS — D5700 Hb-SS disease with crisis, unspecified: Secondary | ICD-10-CM | POA: Diagnosis not present

## 2018-01-15 DIAGNOSIS — R0981 Nasal congestion: Secondary | ICD-10-CM | POA: Diagnosis not present

## 2018-01-15 DIAGNOSIS — Z87891 Personal history of nicotine dependence: Secondary | ICD-10-CM | POA: Diagnosis not present

## 2018-01-15 DIAGNOSIS — D57411 Sickle-cell thalassemia with acute chest syndrome: Secondary | ICD-10-CM | POA: Diagnosis not present

## 2018-01-18 ENCOUNTER — Other Ambulatory Visit: Payer: Self-pay | Admitting: Internal Medicine

## 2018-01-18 DIAGNOSIS — D571 Sickle-cell disease without crisis: Secondary | ICD-10-CM

## 2018-01-18 DIAGNOSIS — Z79891 Long term (current) use of opiate analgesic: Secondary | ICD-10-CM

## 2018-01-18 MED ORDER — OXYCODONE HCL 20 MG PO TABS
1.0000 | ORAL_TABLET | Freq: Four times a day (QID) | ORAL | 0 refills | Status: DC | PRN
Start: 2018-01-18 — End: 2018-01-20

## 2018-01-18 NOTE — Telephone Encounter (Signed)
Refilled

## 2018-01-20 ENCOUNTER — Ambulatory Visit (INDEPENDENT_AMBULATORY_CARE_PROVIDER_SITE_OTHER): Payer: Medicare Other | Admitting: Family Medicine

## 2018-01-20 ENCOUNTER — Encounter: Payer: Self-pay | Admitting: Family Medicine

## 2018-01-20 DIAGNOSIS — D5700 Hb-SS disease with crisis, unspecified: Secondary | ICD-10-CM

## 2018-01-20 DIAGNOSIS — Z79891 Long term (current) use of opiate analgesic: Secondary | ICD-10-CM | POA: Diagnosis not present

## 2018-01-20 DIAGNOSIS — D571 Sickle-cell disease without crisis: Secondary | ICD-10-CM

## 2018-01-20 MED ORDER — OXYCODONE HCL 20 MG PO TABS: 1.0000 | ORAL_TABLET | Freq: Four times a day (QID) | ORAL | 0 refills | Status: DC | PRN

## 2018-01-20 MED ORDER — HYDROXYUREA 500 MG PO CAPS: 1500.0000 mg | ORAL_CAPSULE | Freq: Every day | ORAL | 3 refills | Status: DC

## 2018-01-20 MED FILL — oxyCODONE HCL 20 MG TABS: 20 | 23 days supply | Qty: 90 | Fill #0

## 2018-01-20 MED FILL — HYDROXYUREA 500 MG CAPSULE: 500 | 90 days supply | Qty: 270 | Fill #0

## 2018-01-20 NOTE — Progress Notes (Signed)
Chief Complaint  Patient presents with  . Sickle Cell Anemia  . Medication Refill    hydrea and pain meds     Subjective:    Patient ID: Alejandro Soto, male    DOB: 1985/10/24, 32 y.o.   MRN: [id number]  Medication Refill  Associated symptoms include arthralgias, headaches and joint swelling.   Alejandro Soto, a 32 year old male with a history of sickle cell anemia, HbSS presents for a follow up and medication management. Patient has chronic pain related to sickle cell anemia. Pain is mostly to lower back and lower extremities. Pain intensity is currently 5-6/10 characterized as constant and aching. He last had oxycodone this am with moderate relief. Patient denies headache, chest pain, nausea, vomiting, or diarrhea.   Alejandro Soto also following up on  anxiety disorder.  He has the following symptoms: difficulty concentrating, feelings of losing control, irritable, racing thoughts occasionally which have improved slightly. He denies current suicidal and homicidal ideation.  Risk factors: previous episode of depression and personality disorder. Patient was diagnosed with a schizophrenia many years ago. He has not established care with psychiatry.   He denies visual or auditory hallucinations.   Past Medical History:  Diagnosis Date  . Pneumonia   . Sickle cell anemia (HCC)    Social History   Socioeconomic History  . Marital status: Single    Spouse name: Not on file  . Number of children: Not on file  . Years of education: Not on file  . Highest education level: Not on file  Occupational History  . Not on file  Social Needs  . Financial resource strain: Not on file  . Food insecurity:    Worry: Not on file    Inability: Not on file  . Transportation needs:    Medical: Not on file    Non-medical: Not on file  Tobacco Use  . Smoking status: Current Some Day Smoker    Packs/day: 0.25    Years: 5.00    Pack years: 1.25    Types: Cigarettes  . Smokeless tobacco: Never Used  Substance  and Sexual Activity  . Alcohol use: No    Alcohol/week: 0.0 oz  . Drug use: No  . Sexual activity: Yes  Lifestyle  . Physical activity:    Days per week: Not on file    Minutes per session: Not on file  . Stress: Not on file  Relationships  . Social connections:    Talks on phone: Not on file    Gets together: Not on file    Attends religious service: Not on file    Active member of club or organization: Not on file    Attends meetings of clubs or organizations: Not on file    Relationship status: Not on file  . Intimate partner violence:    Fear of current or ex partner: Not on file    Emotionally abused: Not on file    Physically abused: Not on file    Forced sexual activity: Not on file  Other Topics Concern  . Not on file  Social History Narrative  . Not on file   Immunization History  Administered Date(s) Administered  . Influenza Whole 03/21/2012  . Influenza,inj,Quad PF,6+ Mos 03/21/2013, 03/01/2014  . Pneumococcal Conjugate-13 03/21/2012  . Pneumococcal Polysaccharide-23 07/15/2014  . Tdap 08/14/2016    Review of Systems  Constitutional: Negative.   HENT: Negative.   Eyes: Negative for photophobia, redness and visual disturbance.  Respiratory: Negative.  Cardiovascular: Negative.   Endocrine: Negative for polydipsia, polyphagia and polyuria.  Genitourinary: Negative.   Musculoskeletal: Positive for arthralgias and joint swelling.  Skin: Negative.   Allergic/Immunologic: Negative.   Neurological: Positive for headaches.  Psychiatric/Behavioral: Negative for agitation. The patient is not nervous/anxious.       Objective:   Physical Exam  Constitutional: He appears well-developed and well-nourished.  HENT:  Head: Normocephalic and atraumatic.  Right Ear: External ear normal.  Left Ear: External ear normal.  Eyes: Pupils are equal, round, and reactive to light.  Cardiovascular: Normal rate, regular rhythm and intact distal pulses.  Murmur  heard. Pulmonary/Chest: Effort normal and breath sounds normal.  Abdominal: Soft. Bowel sounds are normal.  Musculoskeletal: Normal range of motion.  Neurological: He is alert.  Skin: Skin is warm.  Psychiatric: His behavior is normal. Judgment and thought content normal.      BP 130/77 (BP Location: Left Arm, Patient Position: Sitting, Cuff Size: Normal)   Pulse 85   Temp 98.7 F (37.1 C) (Oral)   Resp 16   Ht 5\' 7"  (1.702 m)   Wt 164 lb (74.4 kg)   SpO2 97%   BMI 25.69 kg/m  Assessment & Plan:  1. Hb-SS disease without crisis ([place]) Continue Hydrea at current dosage. We discussed the need for good hydration, monitoring of hydration status, avoidance of heat, cold, stress, and infection triggers. We discussed the risks and benefits of Hydrea, including bone marrow suppression, the possibility of GI upset, skin ulcers, hair thinning, and teratogenicity. The patient was reminded of the need to seek medical attention of any symptoms of bleeding, anemia, or infection. Continue folic acid 1 mg daily to prevent aplastic bone marrow crises.   Pulmonary evaluation - Patient denies severe recurrent wheezes, shortness of breath with exercise, or persistent cough. If these symptoms develop, pulmonary function tests with spirometry will be ordered, and if abnormal, plan on referral to Pulmonology for further evaluation..  Cardiac - Routine screening for pulmonary hypertension is not recommended.  Eye - High risk of proliferative retinopathy. Annual eye exam with retinal exam recommended to patient.  Immunization status -  Yearly influenza vaccination is recommended, as well as being up to date with Meningococcal and Pneumococcal vaccines.   Acute and chronic painful episodes - We agreed on current opiate medication regimen.  We discussed that pt is to receive his Schedule II prescriptions only from [place]. Pt is also aware that the prescription history is available to [place] online through the [facility] CSRS.  Controlled substance agreement signed previously.  We reminded Pratyush that all patients receiving Schedule II narcotics must be seen for follow within one month of prescription being requested. We reviewed the terms of our pain agreement, including the need to keep medicines in a safe locked location away from children or pets, and the need to report excess sedation or constipation, measures to avoid constipation, and policies related to early refills and stolen prescriptions. According to the [place] Chronic Pain Initiative program, we have reviewed details related to analgesia, adverse effects, aberrant behaviors. - Oxycodone HCl 20 MG TABS; Take 1 tablet (20 mg total) by mouth every 6 (six) hours as needed.  Dispense: 90 tablet; Refill: 0    [name], FNP-BC Patient [facility] [address] [place], [place] [address] [phone]

## 2018-01-20 NOTE — Patient Instructions (Signed)

## 2018-02-07 ENCOUNTER — Telehealth: Payer: Self-pay

## 2018-02-08 ENCOUNTER — Other Ambulatory Visit: Payer: Self-pay | Admitting: Internal Medicine

## 2018-02-08 DIAGNOSIS — D571 Sickle-cell disease without crisis: Secondary | ICD-10-CM

## 2018-02-08 DIAGNOSIS — Z79891 Long term (current) use of opiate analgesic: Secondary | ICD-10-CM

## 2018-02-08 MED ORDER — OXYCODONE HCL 20 MG PO TABS: 1.0000 | ORAL_TABLET | ORAL | 0 refills | Status: DC | PRN

## 2018-02-08 NOTE — Telephone Encounter (Signed)
Refileed

## 2018-02-09 DIAGNOSIS — M549 Dorsalgia, unspecified: Secondary | ICD-10-CM | POA: Diagnosis not present

## 2018-02-09 DIAGNOSIS — M79605 Pain in left leg: Secondary | ICD-10-CM | POA: Diagnosis not present

## 2018-02-09 DIAGNOSIS — F17210 Nicotine dependence, cigarettes, uncomplicated: Secondary | ICD-10-CM | POA: Diagnosis not present

## 2018-02-09 DIAGNOSIS — D5700 Hb-SS disease with crisis, unspecified: Secondary | ICD-10-CM | POA: Diagnosis not present

## 2018-02-09 DIAGNOSIS — Z79899 Other long term (current) drug therapy: Secondary | ICD-10-CM | POA: Diagnosis not present

## 2018-02-09 DIAGNOSIS — Z862 Personal history of diseases of the blood and blood-forming organs and certain disorders involving the immune mechanism: Secondary | ICD-10-CM | POA: Diagnosis not present

## 2018-02-09 DIAGNOSIS — M79604 Pain in right leg: Secondary | ICD-10-CM | POA: Diagnosis not present

## 2018-02-09 DIAGNOSIS — Z880 Allergy status to penicillin: Secondary | ICD-10-CM | POA: Diagnosis not present

## 2018-02-09 DIAGNOSIS — R51 Headache: Secondary | ICD-10-CM | POA: Diagnosis not present

## 2018-02-10 DIAGNOSIS — Z87891 Personal history of nicotine dependence: Secondary | ICD-10-CM | POA: Diagnosis not present

## 2018-02-10 DIAGNOSIS — Z79899 Other long term (current) drug therapy: Secondary | ICD-10-CM | POA: Diagnosis not present

## 2018-02-10 DIAGNOSIS — D72829 Elevated white blood cell count, unspecified: Secondary | ICD-10-CM | POA: Diagnosis not present

## 2018-02-10 DIAGNOSIS — Z881 Allergy status to other antibiotic agents status: Secondary | ICD-10-CM | POA: Diagnosis not present

## 2018-02-10 DIAGNOSIS — L0231 Cutaneous abscess of buttock: Secondary | ICD-10-CM | POA: Diagnosis not present

## 2018-02-10 DIAGNOSIS — D5700 Hb-SS disease with crisis, unspecified: Secondary | ICD-10-CM | POA: Diagnosis not present

## 2018-02-10 DIAGNOSIS — J189 Pneumonia, unspecified organism: Secondary | ICD-10-CM | POA: Diagnosis not present

## 2018-02-11 DIAGNOSIS — L0231 Cutaneous abscess of buttock: Secondary | ICD-10-CM | POA: Diagnosis not present

## 2018-02-11 DIAGNOSIS — D72829 Elevated white blood cell count, unspecified: Secondary | ICD-10-CM | POA: Diagnosis not present

## 2018-02-11 DIAGNOSIS — D5700 Hb-SS disease with crisis, unspecified: Secondary | ICD-10-CM | POA: Diagnosis not present

## 2018-02-23 ENCOUNTER — Ambulatory Visit: Payer: Medicare Other | Admitting: Family Medicine

## 2018-02-28 ENCOUNTER — Telehealth: Payer: Self-pay

## 2018-02-28 DIAGNOSIS — Z79891 Long term (current) use of opiate analgesic: Secondary | ICD-10-CM

## 2018-02-28 DIAGNOSIS — D571 Sickle-cell disease without crisis: Secondary | ICD-10-CM

## 2018-02-28 MED ORDER — OXYCODONE HCL 20 MG PO TABS
1.0000 | ORAL_TABLET | ORAL | 0 refills | Status: DC | PRN
Start: 2018-02-28 — End: 2018-03-16

## 2018-02-28 NOTE — Telephone Encounter (Signed)
Reviewed [place] Substance Reporting system prior to prescribing opiate medications. No inconsistencies noted.   

## 2018-03-10 ENCOUNTER — Ambulatory Visit: Payer: Medicare Other | Admitting: Family Medicine

## 2018-03-16 ENCOUNTER — Telehealth: Payer: Self-pay

## 2018-03-16 ENCOUNTER — Other Ambulatory Visit: Payer: Self-pay

## 2018-03-16 ENCOUNTER — Ambulatory Visit (INDEPENDENT_AMBULATORY_CARE_PROVIDER_SITE_OTHER): Payer: Medicare Other | Admitting: Family Medicine

## 2018-03-16 ENCOUNTER — Encounter: Payer: Self-pay | Admitting: Family Medicine

## 2018-03-16 VITALS — BP 106/66 | HR 72 | Temp 99.0°F | Resp 18 | Ht 68.0 in | Wt 168.4 lb

## 2018-03-16 DIAGNOSIS — E559 Vitamin D deficiency, unspecified: Secondary | ICD-10-CM

## 2018-03-16 DIAGNOSIS — Z79891 Long term (current) use of opiate analgesic: Secondary | ICD-10-CM

## 2018-03-16 DIAGNOSIS — D571 Sickle-cell disease without crisis: Secondary | ICD-10-CM

## 2018-03-16 LAB — POCT URINALYSIS DIPSTICK
Blood, UA: NEGATIVE
Glucose, UA: NEGATIVE
Ketones, UA: NEGATIVE
Leukocytes, UA: NEGATIVE
Nitrite, UA: NEGATIVE
Protein, UA: NEGATIVE
Spec Grav, UA: >=1.03 — AB (ref 1.010–1.025)
Urobilinogen, UA: 1 E.U./dL
pH, UA: 6 (ref 5.0–8.0)

## 2018-03-16 MED ORDER — OXYCODONE HCL 20 MG PO TABS: 1.0000 | ORAL_TABLET | ORAL | 0 refills | Status: DC | PRN

## 2018-03-16 MED FILL — oxyCODONE HCL 20 MG TABS: 20 | 15 days supply | Qty: 90 | Fill #0

## 2018-03-16 NOTE — Telephone Encounter (Signed)
Patient seen in the office today and meds filled.

## 2018-03-16 NOTE — Progress Notes (Signed)
[facility] INTERNAL MEDICINE AND SICKLE CELL CARE  SICKLE CELL ANEMIA FOLLOW UP VISIT PROVIDER: Lanae Boast, FNP    Subjective:   Alejandro Soto  is a 32 y.o.  male who  has a past medical history of Pneumonia and Sickle cell anemia ([place]). presents for a follow up for Sickle Cell Anemia. his last hospitalization was 02/10/2018. he has had  Less than 6 hospitalizations in the past 12 months.  Pain regimen includes: Ibuprofen and oxycodone 20mg   Hydrea Therapy: Yes Medication compliance: Yes  Pain today is 8/10 Patient reports adequate hydration daily.   Review of Systems  Constitutional: Negative.   HENT: Negative.   Eyes: Negative.   Respiratory: Negative.   Cardiovascular: Negative.   Gastrointestinal: Negative.   Genitourinary: Negative.   Musculoskeletal: Positive for back pain.  Skin: Negative.   Neurological: Negative.   Psychiatric/Behavioral: Negative.     Objective:   Objective  BP 106/66 (BP Location: Left Arm, Patient Position: Sitting, Cuff Size: Normal)   Pulse 72   Temp 99 F (37.2 C) (Oral)   Resp 18   Ht 5\' 8"  (1.727 m)   Wt 168 lb 6.4 oz (76.4 kg)   SpO2 98%   BMI 25.61 kg/m    Physical Exam  Constitutional: He is oriented to person, place, and time. He appears well-developed and well-nourished. No distress.  HENT:  Head: Normocephalic and atraumatic.  Eyes: Pupils are equal, round, and reactive to light. Conjunctivae and EOM are normal.  Neck: Normal range of motion.  Cardiovascular: Normal rate, regular rhythm and intact distal pulses.  Murmur heard. Pulmonary/Chest: Effort normal and breath sounds normal. No respiratory distress.  Abdominal: Soft. Bowel sounds are normal. He exhibits no distension.  Musculoskeletal: Normal range of motion. He exhibits tenderness (back pain ).  Neurological: He is alert and oriented to person, place, and time.  Skin: Skin is warm and dry.  Psychiatric: He has a normal mood and affect. His  behavior is normal. Thought content normal.  Nursing note and vitals reviewed.    Assessment/Plan:   Assessment   Encounter Diagnoses  Name Primary?  . Sickle cell disease, type SS ([place]) Yes  . Hb-SS disease without crisis ([place])   . Chronic prescription opiate use   . Vitamin D deficiency      Plan  1. Sickle cell disease, type SS (HCC)  - CBC with Differential - Comprehensive metabolic panel - Urinalysis Dipstick - 220254 [id number]  2. Hb-SS disease without crisis (HCC) - Oxycodone HCl 20 MG TABS; Take 1 tablet (20 mg total) by mouth every 4 (four) hours as needed for up to 15 days.  Dispense: 90 tablet; Refill: 0  3. Chronic prescription opiate use - Oxycodone HCl 20 MG TABS; Take 1 tablet (20 mg total) by mouth every 4 (four) hours as needed for up to 15 days.  Dispense: 90 tablet; Refill: 0  4. Vitamin D deficiency - Vitamin D, 25-hydroxy   Return to care as scheduled and prn. Patient verbalized understanding and agreed with plan of care.   1. Sickle cell disease - Continue Hydrea  We discussed the need for good hydration, monitoring of hydration status, avoidance of heat, cold, stress, and infection triggers. We discussed the risks and benefits of Hydrea, including bone marrow suppression, the possibility of GI upset, skin ulcers, hair thinning, and teratogenicity. The patient was reminded of the need to seek medical attention of any symptoms of bleeding, anemia, or infection. Continue folic acid 1  mg daily to prevent aplastic bone marrow crises.   2. Pulmonary evaluation - Patient denies severe recurrent wheezes, shortness of breath with exercise, or persistent cough. If these symptoms develop, pulmonary function tests with spirometry will be ordered, and if abnormal, plan on referral to Pulmonology for further evaluation.  3. Cardiac - Routine screening for pulmonary hypertension is not recommended.  4. Eye - High risk of proliferative retinopathy. Annual  eye exam with retinal exam recommended to patient.  5. Immunization status -  Yearly influenza vaccination is recommended, as well as being up to date with Meningococcal and Pneumococcal vaccines.   6. Acute and chronic painful episodes - We discussed that pt is to receive Schedule II prescriptions only from [place]. Pt is also aware that the prescription history is available to [place] online through the [facility] CSRS. Controlled substance agreement signed. We reminded Alejandro Soto that all patients receiving Schedule II narcotics must be seen for follow within one month of prescription being requested. We reviewed the terms of our pain agreement, including the need to keep medicines in a safe locked location away from children or pets, and the need to report excess sedation or constipation, measures to avoid constipation, and policies related to early refills and stolen prescriptions. According to the [place] Chronic Pain Initiative program, we have reviewed details related to analgesia, adverse effects, aberrant behaviors.  7. Iron overload from chronic transfusion.  Not applicable at this time.  If this occurs will use Exjade for management.   8. Vitamin D deficiency - Drisdol 50,000 units weekly. Patient encouraged to take as prescribed.   The above recommendations are taken from the NIH Evidence-Based Management of Sickle Cell Disease: Expert Panel Report, 20149.   [name], FNP-BC Patient [facility] [address] [place],  [address] [phone]  This note has been created with Dragon speech recognition software and smart phrase technology. Any transcriptional errors are unintentional.

## 2018-03-16 NOTE — Patient Instructions (Signed)

## 2018-03-17 ENCOUNTER — Other Ambulatory Visit: Payer: Self-pay | Admitting: Family Medicine

## 2018-03-17 DIAGNOSIS — F411 Generalized anxiety disorder: Secondary | ICD-10-CM

## 2018-03-17 LAB — COMPREHENSIVE METABOLIC PANEL
ALT: 29 IU/L (ref 0–44)
AST: 38 IU/L (ref 0–40)
Albumin/Globulin Ratio: 1.6 (ref 1.2–2.2)
Albumin: 4.7 g/dL (ref 3.5–5.5)
Alkaline Phosphatase: 80 IU/L (ref 39–117)
BUN/Creatinine Ratio: 12 (ref 9–20)
BUN: 9 mg/dL (ref 6–20)
Bilirubin Total: 1.3 mg/dL — ABNORMAL HIGH (ref 0.0–1.2)
CO2: 24 mmol/L (ref 20–29)
Calcium: 9.6 mg/dL (ref 8.7–10.2)
Chloride: 99 mmol/L (ref 96–106)
Creatinine, Ser: 0.78 mg/dL (ref 0.76–1.27)
GFR calc Af Amer: 138 mL/min/{1.73_m2} (ref >59)
GFR calc non Af Amer: 119 mL/min/{1.73_m2} (ref >59)
Globulin, Total: 3 g/dL (ref 1.5–4.5)
Glucose: 94 mg/dL (ref 65–99)
Potassium: 4.7 mmol/L (ref 3.5–5.2)
Sodium: 138 mmol/L (ref 134–144)
Total Protein: 7.7 g/dL (ref 6.0–8.5)

## 2018-03-17 LAB — CBC WITH DIFFERENTIAL/PLATELET
Basophils Absolute: 0.1 10*3/uL (ref 0.0–0.2)
Basos: 1 %
EOS (ABSOLUTE): 0.7 10*3/uL — ABNORMAL HIGH (ref 0.0–0.4)
Eos: 3 %
Hematocrit: 28.1 % — ABNORMAL LOW (ref 37.5–51.0)
Hemoglobin: 9.5 g/dL — ABNORMAL LOW (ref 13.0–17.7)
Immature Grans (Abs): 0.3 10*3/uL — ABNORMAL HIGH (ref 0.0–0.1)
Immature Granulocytes: 2 %
Lymphocytes Absolute: 6.3 10*3/uL — ABNORMAL HIGH (ref 0.7–3.1)
Lymphs: 29 %
MCH: 30.1 pg (ref 26.6–33.0)
MCHC: 33.8 g/dL (ref 31.5–35.7)
MCV: 89 fL (ref 79–97)
Monocytes Absolute: 1.8 10*3/uL — ABNORMAL HIGH (ref 0.1–0.9)
Monocytes: 9 %
NRBC: 6 % — ABNORMAL HIGH (ref 0–0)
Neutrophils Absolute: 12.2 10*3/uL — ABNORMAL HIGH (ref 1.4–7.0)
Neutrophils: 56 %
Platelets: 374 10*3/uL (ref 150–450)
RBC: 3.16 x10E6/uL — ABNORMAL LOW (ref 4.14–5.80)
RDW: 18.3 % — ABNORMAL HIGH (ref 12.3–15.4)
WBC: 21.4 10*3/uL (ref 3.4–10.8)

## 2018-03-17 LAB — VITAMIN D 25 HYDROXY (VIT D DEFICIENCY, FRACTURES): Vit D, 25-Hydroxy: 14.5 ng/mL — ABNORMAL LOW (ref 30.0–100.0)

## 2018-03-17 MED ORDER — VITAMIN D (ERGOCALCIFEROL) 1.25 MG (50000 UNIT) PO CAPS: 50000.0000 [IU] | ORAL_CAPSULE | ORAL | 3 refills | Status: AC

## 2018-03-17 NOTE — Progress Notes (Signed)
Low Vitamin D. All other labs are stable. I will send Vit D to the pharmacy.

## 2018-03-21 LAB — BENZODIAZEPINES CONFIRM, URINE
ALPRAZOLAM CONFIRM: 436 ng/mL
ALPRAZOLAM: POSITIVE — AB
BENZODIAZEPINES: POSITIVE ng/mL — AB
CLONAZEPAM: NEGATIVE
Flurazepam: NEGATIVE
LORAZEPAM: NEGATIVE
MIDAZOLAM: NEGATIVE
NORDIAZEPAM: NEGATIVE
Oxazepam: NEGATIVE
TEMAZEPAM: NEGATIVE
TRIAZOLAM: NEGATIVE

## 2018-03-21 LAB — 737588 9+OXYCODONE+CRT-UNBUND
Amphetamine Scrn, Ur: NEGATIVE ng/mL
BARBITURATE SCREEN URINE: NEGATIVE ng/mL
CANNABINOIDS UR QL SCN: NEGATIVE ng/mL
Cocaine (Metab) Scrn, Ur: NEGATIVE ng/mL
Creatinine(Crt), U: 192 mg/dL (ref 20.0–300.0)
Methadone Screen, Urine: NEGATIVE ng/mL
OXYCODONE+OXYMORPHONE UR QL SCN: NEGATIVE ng/mL
Opiate Scrn, Ur: NEGATIVE ng/mL
Ph of Urine: 5.9 (ref 4.5–8.9)
Phencyclidine Qn, Ur: NEGATIVE ng/mL
Propoxyphene Scrn, Ur: NEGATIVE ng/mL

## 2018-04-01 ENCOUNTER — Telehealth: Payer: Self-pay

## 2018-04-01 DIAGNOSIS — D571 Sickle-cell disease without crisis: Secondary | ICD-10-CM

## 2018-04-01 DIAGNOSIS — Z79891 Long term (current) use of opiate analgesic: Secondary | ICD-10-CM

## 2018-04-04 MED ORDER — OXYCODONE HCL 20 MG PO TABS: 1.0000 | ORAL_TABLET | ORAL | 0 refills | Status: DC | PRN

## 2018-04-04 MED FILL — oxyCODONE HCL 20 MG TABS: 20 | 15 days supply | Qty: 90 | Fill #0

## 2018-04-04 NOTE — Telephone Encounter (Signed)
Reviewed [place] Substance Reporting system prior to prescribing opiate medications. No inconsistencies noted.  Refilled

## 2018-04-12 ENCOUNTER — Telehealth: Payer: Self-pay

## 2018-04-12 NOTE — Telephone Encounter (Signed)
Called, phone did not ring and said "call rejected". Thanks!

## 2018-04-14 ENCOUNTER — Ambulatory Visit (INDEPENDENT_AMBULATORY_CARE_PROVIDER_SITE_OTHER): Payer: Medicare Other | Admitting: Family Medicine

## 2018-04-14 ENCOUNTER — Encounter: Payer: Self-pay | Admitting: Family Medicine

## 2018-04-14 VITALS — BP 148/84 | HR 99 | Temp 98.8°F | Resp 16 | Ht 68.0 in | Wt 166.0 lb

## 2018-04-14 DIAGNOSIS — L209 Atopic dermatitis, unspecified: Secondary | ICD-10-CM | POA: Diagnosis not present

## 2018-04-14 DIAGNOSIS — E559 Vitamin D deficiency, unspecified: Secondary | ICD-10-CM

## 2018-04-14 DIAGNOSIS — D571 Sickle-cell disease without crisis: Secondary | ICD-10-CM | POA: Diagnosis not present

## 2018-04-14 DIAGNOSIS — M792 Neuralgia and neuritis, unspecified: Secondary | ICD-10-CM

## 2018-04-14 LAB — POCT URINALYSIS DIPSTICK
Bilirubin, UA: NEGATIVE
Blood, UA: NEGATIVE
Glucose, UA: NEGATIVE
Leukocytes, UA: NEGATIVE
Nitrite, UA: NEGATIVE
Protein, UA: POSITIVE — AB
Spec Grav, UA: 1.015 (ref 1.010–1.025)
Urobilinogen, UA: 4 E.U./dL — AB
pH, UA: 6 (ref 5.0–8.0)

## 2018-04-14 MED ORDER — IBUPROFEN 600 MG PO TABS: 600.0000 mg | ORAL_TABLET | Freq: Three times a day (TID) | ORAL | 1 refills | Status: DC | PRN

## 2018-04-14 MED ORDER — TRIAMCINOLONE ACETONIDE 0.025 % EX OINT: 1.0000 "application " | TOPICAL_OINTMENT | Freq: Two times a day (BID) | CUTANEOUS | 2 refills | Status: DC

## 2018-04-14 MED ORDER — GABAPENTIN 300 MG PO CAPS: 300.0000 mg | ORAL_CAPSULE | Freq: Two times a day (BID) | ORAL | 2 refills | Status: DC

## 2018-04-14 NOTE — Progress Notes (Signed)
PATIENT [facility]ARE CENTER INTERNAL MEDICINE AND SICKLE CELL CARE  SICKLE CELL ANEMIA FOLLOW UP VISIT PROVIDER: Lanae Boast, FNP    Subjective:   Alejandro Soto  is a 32 y.o.  male who  has a past medical history of Pneumonia and Sickle cell anemia ([place]). presents for a follow up for Sickle Cell Anemia. his last hospitalization was 02/10/2018 Hydrea Therapy: Yes Medication compliance: Yes  Pain today is 7/10 and is generalized.  Patient states that he had swelling and pain in bilateral feet a week ago.  The pain and swelling have now resolved.  Patient states that he believes that it is from him wearing his sandals. Patient reports adequate daily hydration.   Review of Systems  Constitutional: Negative.   HENT: Negative.   Eyes: Negative.   Respiratory: Negative.   Cardiovascular: Negative.   Gastrointestinal: Negative.   Genitourinary: Negative.   Musculoskeletal: Positive for myalgias.  Skin: Negative.   Neurological: Negative.   Psychiatric/Behavioral: Negative.     Objective:   Objective  BP (!) 148/84 (BP Location: Left Arm, Patient Position: Sitting, Cuff Size: Normal)   Pulse 99   Temp 98.8 F (37.1 C) (Oral)   Resp 16   Ht 5\' 8"  (1.727 m)   Wt 166 lb (75.3 kg)   SpO2 99%   BMI 25.24 kg/m    Physical Exam  Constitutional: He is oriented to person, place, and time. He appears well-developed and well-nourished. No distress.  HENT:  Head: Normocephalic and atraumatic.  Eyes: Pupils are equal, round, and reactive to light. Conjunctivae and EOM are normal.  Neck: Normal range of motion.  Cardiovascular: Normal rate, regular rhythm, normal heart sounds and intact distal pulses.  Pulmonary/Chest: Effort normal and breath sounds normal. No respiratory distress.  Abdominal: Soft. Bowel sounds are normal. He exhibits no distension.  Musculoskeletal: Normal range of motion.  Neurological: He is alert and oriented to person, place, and time.  Skin: Skin is warm and dry.   Psychiatric: He has a normal mood and affect. His behavior is normal. Thought content normal.  Nursing note and vitals reviewed.    Assessment/Plan:   Assessment   Encounter Diagnosis  Name Primary?  . Sickle cell disease, type SS ([place]) Yes     Plan  1. Sickle cell disease, type SS ([place]) Continue with current medications.  Labs are pending.  Will adjust medications according to lab results.- Urinalysis Dipstick - ibuprofen (ADVIL,MOTRIN) 600 MG tablet; Take 1 tablet (600 mg total) by mouth every 8 (eight) hours as needed for mild pain or moderate pain.  Dispense: 30 tablet; Refill: 1 - CBC with Differential - Comprehensive metabolic panel - VITAMIN D 25 Hydroxy (Vit-D Deficiency, Fractures) - Reticulocytes  2. Atopic dermatitis Patient requesting refill on steroid ointment.  No rash present today on examination - triamcinolone (KENALOG) 0.025 % ointment; Apply 1 application topically 2 (two) times daily.  Dispense: 30 g; Refill: 2  3. Neuropathic pain Refill medication - gabapentin (NEURONTIN) 300 MG capsule; Take 1 capsule (300 mg total) by mouth 2 (two) times daily.  Dispense: 60 capsule; Refill: 2  4. Vitamin D deficiency Labs pending.  Will adjust meds accordingly. - VITAMIN D 25 Hydroxy (Vit-D Deficiency, Fractures)    Return to care as scheduled and prn. Patient verbalized understanding and agreed with plan of care.   1. Sickle cell disease - Continue Hydrea  We discussed the need for good hydration, monitoring of hydration status, avoidance of heat, cold, stress, and infection  triggers. We discussed the risks and benefits of Hydrea, including bone marrow suppression, the possibility of GI upset, skin ulcers, hair thinning, and teratogenicity. The patient was reminded of the need to seek medical attention of any symptoms of bleeding, anemia, or infection. Continue folic acid 1 mg daily to prevent aplastic bone marrow crises.   2. Pulmonary evaluation - Patient denies  severe recurrent wheezes, shortness of breath with exercise, or persistent cough. If these symptoms develop, pulmonary function tests with spirometry will be ordered, and if abnormal, plan on referral to Pulmonology for further evaluation.  3. Cardiac - Routine screening for pulmonary hypertension is not recommended.  4. Eye - High risk of proliferative retinopathy. Annual eye exam with retinal exam recommended to patient.  5. Immunization status -  Yearly influenza vaccination is recommended, as well as being up to date with Meningococcal and Pneumococcal vaccines.   6. Acute and chronic painful episodes - We discussed that pt is to receive Schedule II prescriptions only from [place]. Pt is also aware that the prescription history is available to [place] online through the [facility] CSRS. Controlled substance agreement signed. We reminded Alejandro Soto that all patients receiving Schedule II narcotics must be seen for follow within one month of prescription being requested. We reviewed the terms of our pain agreement, including the need to keep medicines in a safe locked location away from children or pets, and the need to report excess sedation or constipation, measures to avoid constipation, and policies related to early refills and stolen prescriptions. According to the [place] Chronic Pain Initiative program, we have reviewed details related to analgesia, adverse effects, aberrant behaviors.  7. Iron overload from chronic transfusion.  Not applicable at this time.  If this occurs will use Exjade for management.   8. Vitamin D deficiency - Drisdol 50,000 units weekly. Patient encouraged to take as prescribed.   The above recommendations are taken from the NIH Evidence-Based Management of Sickle Cell Disease: Expert Panel Report, 20149.   [name], FNP-BC Patient [facility] [address] [place], [place] [address] [phone]  This note has been created with Dragon  speech recognition software and smart phrase technology. Any transcriptional errors are unintentional.

## 2018-04-14 NOTE — Patient Instructions (Signed)
Sickle Cell Anemia, Adult °Sickle cell anemia is a condition where your red blood cells are shaped like sickles. Red blood cells carry oxygen through the body. Sickle-shaped red blood cells do not live as long as normal red blood cells. They also clump together and block blood from flowing through the blood vessels. These things prevent the body from getting enough oxygen. Sickle cell anemia causes organ damage and pain. It also increases the risk of infection. °Follow these instructions at home: °· Drink enough fluid to keep your pee (urine) clear or pale yellow. Drink more in hot weather and during exercise. °· Do not smoke. Smoking lowers oxygen levels in the blood. °· Only take over-the-counter or prescription medicines as told by your doctor. °· Take antibiotic medicines as told by your doctor. Make sure you finish them even if you start to feel better. °· Take supplements as told by your doctor. °· Consider wearing a medical alert bracelet. This tells anyone caring for you in an emergency of your condition. °· When traveling, keep your medical information, doctors' names, and the medicines you take with you at all times. °· If you have a fever, do not take fever medicines right away. This could cover up a problem. Tell your doctor. °· Keep all follow-up visits with your doctor. Sickle cell anemia requires regular medical care. °Contact a doctor if: °You have a fever. °Get help right away if: °· You feel dizzy or faint. °· You have new belly (abdominal) pain, especially on the left side near the stomach area. °· You have a lasting, often uncomfortable and painful erection of the penis (priapism). If it is not treated right away, you will become unable to have sex (impotence). °· You have numbness in your arms or legs or you have a hard time moving them. °· You have a hard time talking. °· You have a fever or lasting symptoms for more than 2-3 days. °· You have a fever and your symptoms suddenly get  worse. °· You have signs or symptoms of infection. These include: °? Chills. °? Being more tired than normal (lethargy). °? Irritability. °? Poor eating. °? Throwing up (vomiting). °· You have pain that is not helped with medicine. °· You have shortness of breath. °· You have pain in your chest. °· You are coughing up pus-like or bloody mucus. °· You have a stiff neck. °· Your feet or hands swell or have pain. °· Your belly looks bloated. °· Your joints hurt. °This information is not intended to replace advice given to you by your health care provider. Make sure you discuss any questions you have with your health care provider. °Document Released: 03/29/2013 Document Revised: 11/14/2015 Document Reviewed: 01/18/2013 °Elsevier Interactive Patient Education © 2017 [employer] Inc. ° °

## 2018-04-15 ENCOUNTER — Telehealth: Payer: Self-pay

## 2018-04-15 DIAGNOSIS — D571 Sickle-cell disease without crisis: Secondary | ICD-10-CM

## 2018-04-15 DIAGNOSIS — Z79891 Long term (current) use of opiate analgesic: Secondary | ICD-10-CM

## 2018-04-15 LAB — CBC WITH DIFFERENTIAL/PLATELET
Basophils Absolute: 0.1 10*3/uL (ref 0.0–0.2)
Basos: 1 %
EOS (ABSOLUTE): 0.7 10*3/uL — ABNORMAL HIGH (ref 0.0–0.4)
Eos: 3 %
Hematocrit: 28.6 % — ABNORMAL LOW (ref 37.5–51.0)
Hemoglobin: 9.8 g/dL — ABNORMAL LOW (ref 13.0–17.7)
Immature Grans (Abs): 0.2 10*3/uL — ABNORMAL HIGH (ref 0.0–0.1)
Immature Granulocytes: 1 %
Lymphocytes Absolute: 6.9 10*3/uL — ABNORMAL HIGH (ref 0.7–3.1)
Lymphs: 32 %
MCH: 31.3 pg (ref 26.6–33.0)
MCHC: 34.3 g/dL (ref 31.5–35.7)
MCV: 91 fL (ref 79–97)
Monocytes Absolute: 1.8 10*3/uL — ABNORMAL HIGH (ref 0.1–0.9)
Monocytes: 8 %
Neutrophils Absolute: 11.8 10*3/uL — ABNORMAL HIGH (ref 1.4–7.0)
Neutrophils: 55 %
Platelets: 436 10*3/uL (ref 150–450)
RBC: 3.13 x10E6/uL — ABNORMAL LOW (ref 4.14–5.80)
RDW: 18.6 % — ABNORMAL HIGH (ref 12.3–15.4)
WBC: 21.5 10*3/uL (ref 3.4–10.8)

## 2018-04-15 LAB — COMPREHENSIVE METABOLIC PANEL
ALT: 30 IU/L (ref 0–44)
AST: 40 IU/L (ref 0–40)
Albumin/Globulin Ratio: 1.6 (ref 1.2–2.2)
Albumin: 4.7 g/dL (ref 3.5–5.5)
Alkaline Phosphatase: 65 IU/L (ref 39–117)
BUN/Creatinine Ratio: 14 (ref 9–20)
BUN: 11 mg/dL (ref 6–20)
Bilirubin Total: 1.3 mg/dL — ABNORMAL HIGH (ref 0.0–1.2)
CO2: 24 mmol/L (ref 20–29)
Calcium: 9.6 mg/dL (ref 8.7–10.2)
Chloride: 101 mmol/L (ref 96–106)
Creatinine, Ser: 0.8 mg/dL (ref 0.76–1.27)
GFR calc Af Amer: 137 mL/min/{1.73_m2} (ref >59)
GFR calc non Af Amer: 118 mL/min/{1.73_m2} (ref >59)
Globulin, Total: 2.9 g/dL (ref 1.5–4.5)
Glucose: 90 mg/dL (ref 65–99)
Potassium: 4.3 mmol/L (ref 3.5–5.2)
Sodium: 142 mmol/L (ref 134–144)
Total Protein: 7.6 g/dL (ref 6.0–8.5)

## 2018-04-15 LAB — RETICULOCYTES: Retic Ct Pct: 9.6 % — ABNORMAL HIGH (ref 0.6–2.6)

## 2018-04-15 LAB — VITAMIN D 25 HYDROXY (VIT D DEFICIENCY, FRACTURES): Vit D, 25-Hydroxy: 17.4 ng/mL — ABNORMAL LOW (ref 30.0–100.0)

## 2018-04-15 MED ORDER — OXYCODONE HCL 20 MG PO TABS: 1.0000 | ORAL_TABLET | Freq: Four times a day (QID) | ORAL | 0 refills | Status: DC | PRN

## 2018-04-15 NOTE — Telephone Encounter (Signed)
Patient reports taking 4 pills per day on most days. Will decrease to 75 pills q 2 weeks.

## 2018-04-18 NOTE — Progress Notes (Signed)
Letter mailed. Thanks!

## 2018-05-03 ENCOUNTER — Telehealth: Payer: Self-pay

## 2018-05-03 DIAGNOSIS — Z79891 Long term (current) use of opiate analgesic: Secondary | ICD-10-CM

## 2018-05-03 DIAGNOSIS — D571 Sickle-cell disease without crisis: Secondary | ICD-10-CM

## 2018-05-04 MED ORDER — OXYCODONE HCL 20 MG PO TABS: 1.0000 | ORAL_TABLET | Freq: Four times a day (QID) | ORAL | 0 refills | Status: DC | PRN

## 2018-05-04 NOTE — Telephone Encounter (Signed)
Sent to pharm on file

## 2018-05-16 ENCOUNTER — Ambulatory Visit (INDEPENDENT_AMBULATORY_CARE_PROVIDER_SITE_OTHER): Payer: Medicare Other | Admitting: Family Medicine

## 2018-05-16 VITALS — BP 127/67 | HR 104 | Temp 98.0°F | Resp 16 | Ht 68.0 in | Wt 167.0 lb

## 2018-05-16 DIAGNOSIS — M792 Neuralgia and neuritis, unspecified: Secondary | ICD-10-CM

## 2018-05-16 DIAGNOSIS — D571 Sickle-cell disease without crisis: Secondary | ICD-10-CM | POA: Diagnosis not present

## 2018-05-16 LAB — POCT URINALYSIS DIPSTICK
Bilirubin, UA: NEGATIVE
Blood, UA: NEGATIVE
Glucose, UA: NEGATIVE
Ketones, UA: NEGATIVE
Leukocytes, UA: NEGATIVE
Nitrite, UA: NEGATIVE
Protein, UA: NEGATIVE
Spec Grav, UA: <=1.005 — AB (ref 1.010–1.025)
Urobilinogen, UA: 0.2 E.U./dL
pH, UA: 5.5 (ref 5.0–8.0)

## 2018-05-16 MED ORDER — IBUPROFEN 800 MG PO TABS: 800.0000 mg | ORAL_TABLET | Freq: Three times a day (TID) | ORAL | 0 refills | Status: DC | PRN

## 2018-05-16 MED ORDER — GABAPENTIN 600 MG PO TABS: 600.0000 mg | ORAL_TABLET | Freq: Two times a day (BID) | ORAL | 0 refills | Status: DC

## 2018-05-16 MED ORDER — ACETAMINOPHEN-CODEINE #3 300-30 MG PO TABS: 1.0000 | ORAL_TABLET | Freq: Three times a day (TID) | ORAL | 0 refills | Status: AC | PRN

## 2018-05-16 MED FILL — ACETAMINOPHEN/COD #3 TABLET: 300-30 | 12 days supply | Qty: 75 | Fill #0

## 2018-05-16 MED FILL — GABAPENTIN 600 MG TABLET: 600 | 30 days supply | Qty: 60 | Fill #0

## 2018-05-16 MED FILL — IBUPROFEN 800 MG TAB: 800 | 30 days supply | Qty: 90 | Fill #0

## 2018-05-16 NOTE — Progress Notes (Signed)
PATIENT CARE CENTER INTERNAL MEDICINE AND SICKLE CELL CARE  SICKLE CELL ANEMIA FOLLOW UP VISIT PROVIDER: Lanae Boast, FNP    Subjective:   Alejandro Soto  is a 32 y.o.  male who  has a past medical history of Pneumonia and Sickle cell anemia ([place]). presents for a follow up for Sickle Cell Anemia. The patient has had 1 ED  admissions in the past 6 months.  Pain regimen includes: Ibuprofen and oxycodone 20 mg Q6H Hydrea Therapy: Yes Medication compliance: No Patient without oxycodone in his urine drug screen at the last visit.  Pain today is 5/10  The patient reports adequate daily hydration.      Review of Systems  Constitutional: Negative.   HENT: Negative.   Eyes: Negative.   Respiratory: Negative.   Cardiovascular: Negative.   Gastrointestinal: Negative.   Genitourinary: Negative.   Musculoskeletal: Positive for back pain.  Skin: Negative.   Neurological: Negative.   Psychiatric/Behavioral: Negative.     Objective:   Objective  BP 127/67 (BP Location: Right Arm, Patient Position: Sitting, Cuff Size: Normal)   Pulse (!) 104   Temp 98 F (36.7 C) (Oral)   Resp 16   Ht 5\' 8"  (1.727 m)   Wt 167 lb (75.8 kg)   SpO2 98%   BMI 25.39 kg/m   Wt Readings from Last 3 Encounters:  05/16/18 167 lb (75.8 kg)  04/14/18 166 lb (75.3 kg)  03/16/18 168 lb 6.4 oz (76.4 kg)     Physical Exam  Constitutional: He is oriented to person, place, and time. He appears well-developed and well-nourished. No distress.  HENT:  Head: Normocephalic and atraumatic.  Eyes: Pupils are equal, round, and reactive to light. Conjunctivae and EOM are normal.  Neck: Normal range of motion.  Cardiovascular: Normal rate, regular rhythm, normal heart sounds and intact distal pulses.  Pulmonary/Chest: Effort normal and breath sounds normal. No respiratory distress.  Musculoskeletal: Normal range of motion.  Neurological: He is alert and oriented to person, place, and time.  Skin: Skin is warm  and dry.  Psychiatric: He has a normal mood and affect. His behavior is normal. Judgment and thought content normal.  Nursing note and vitals reviewed.    Assessment/Plan:   Assessment   Encounter Diagnoses  Name Primary?  Marland Kitchen Hb-SS disease without crisis ([place]) Yes  . Sickle cell disease, type SS ([place])   . Neuropathic pain      Plan  1. Hb-SS disease without crisis [facility]) Discussed lack of medication in his urine and + xanax. He reports that he runs out of medication a few days prior to his visit. Explained that this medication should still be in his system for a week after taking. He states that he is not sure of the last time he took his pain medication. He reports taking a "pill for a headache" that was given to him by his friend. He thinks that this may have been xanax. Explained to patient the dangers in combining benzos and opiates. UDS taken today.  Discussed the types of pain- vaso-occlusive versus withdrawal versus neuropathic. Will send tylenol 3 to the pharmacy and re-evaluate when UDS returns.  - Urinalysis Dipstick - ibuprofen (ADVIL,MOTRIN) 800 MG tablet; Take 1 tablet (800 mg total) by mouth every 8 (eight) hours as needed for mild pain.  Dispense: 90 tablet; Refill: 0 - gabapentin (NEURONTIN) 600 MG tablet; Take 1 tablet (600 mg total) by mouth 2 (two) times daily.  Dispense: 60 tablet; Refill: 0 -  acetaminophen-codeine (TYLENOL #3) 300-30 MG tablet; Take 1-2 tablets by mouth every 8 (eight) hours as needed for up to 15 days for moderate pain.  Dispense: 75 tablet; Refill: 0 - CBC with Differential - Comprehensive metabolic panel - [id number]   Neuropathic pain Increased neurontin to 600 mg BID. Will re-evaluate at next visit.  - gabapentin (NEURONTIN) 600 MG tablet; Take 1 tablet (600 mg total) by mouth 2 (two) times daily.  Dispense: 60 tablet; Refill: 0   Return to care as scheduled and prn. Patient verbalized understanding and agreed with plan of  care.   1. Sickle cell disease - Continue Hydrea   We discussed the need for good hydration, monitoring of hydration status, avoidance of heat, cold, stress, and infection triggers. We discussed the risks and benefits of Hydrea, including bone marrow suppression, the possibility of GI upset, skin ulcers, hair thinning, and teratogenicity. The patient was reminded of the need to seek medical attention of any symptoms of bleeding, anemia, or infection. Continue folic acid 1 mg daily to prevent aplastic bone marrow crises.   2. Pulmonary evaluation - Patient denies severe recurrent wheezes, shortness of breath with exercise, or persistent cough. If these symptoms develop, pulmonary function tests with spirometry will be ordered, and if abnormal, plan on referral to Pulmonology for further evaluation.  3. Cardiac - Routine screening for pulmonary hypertension is not recommended.  4. Eye - High risk of proliferative retinopathy. Annual eye exam with retinal exam recommended to patient.  5. Immunization status -  Yearly influenza vaccination is recommended, as well as being up to date with Meningococcal and Pneumococcal vaccines.   6. Acute and chronic painful episodes - We discussed that pt is to receive Schedule II prescriptions only from [place]. Pt is also aware that the prescription history is available to [place] online through the [facility] CSRS. Controlled substance agreement signed. We reminded Aadi Bordner that all patients receiving Schedule II narcotics must be seen for follow within one month of prescription being requested. We reviewed the terms of our pain agreement, including the need to keep medicines in a safe locked location away from children or pets, and the need to report excess sedation or constipation, measures to avoid constipation, and policies related to early refills and stolen prescriptions. According to the [place] Chronic Pain Initiative program, we have reviewed details related to analgesia, adverse  effects, aberrant behaviors.  7. Iron overload from chronic transfusion.  Not applicable at this time.  If this occurs will use Exjade for management.   8. Vitamin D deficiency - Drisdol 50,000 units weekly. Patient encouraged to take as prescribed.   The above recommendations are taken from the NIH Evidence-Based Management of Sickle Cell Disease: Expert Panel Report, 20149.   [name], FNP-BC Patient [facility] [address] [place], [place] [address] [phone]  This note has been created with Dragon speech recognition software and smart phrase technology. Any transcriptional errors are unintentional.

## 2018-05-16 NOTE — Patient Instructions (Signed)
Sickle Cell Anemia, Adult °Sickle cell anemia is a condition where your red blood cells are shaped like sickles. Red blood cells carry oxygen through the body. Sickle-shaped red blood cells do not live as long as normal red blood cells. They also clump together and block blood from flowing through the blood vessels. These things prevent the body from getting enough oxygen. Sickle cell anemia causes organ damage and pain. It also increases the risk of infection. °Follow these instructions at home: °· Drink enough fluid to keep your pee (urine) clear or pale yellow. Drink more in hot weather and during exercise. °· Do not smoke. Smoking lowers oxygen levels in the blood. °· Only take over-the-counter or prescription medicines as told by your doctor. °· Take antibiotic medicines as told by your doctor. Make sure you finish them even if you start to feel better. °· Take supplements as told by your doctor. °· Consider wearing a medical alert bracelet. This tells anyone caring for you in an emergency of your condition. °· When traveling, keep your medical information, doctors' names, and the medicines you take with you at all times. °· If you have a fever, do not take fever medicines right away. This could cover up a problem. Tell your doctor. °· Keep all follow-up visits with your doctor. Sickle cell anemia requires regular medical care. °Contact a doctor if: °You have a fever. °Get help right away if: °· You feel dizzy or faint. °· You have new belly (abdominal) pain, especially on the left side near the stomach area. °· You have a lasting, often uncomfortable and painful erection of the penis (priapism). If it is not treated right away, you will become unable to have sex (impotence). °· You have numbness in your arms or legs or you have a hard time moving them. °· You have a hard time talking. °· You have a fever or lasting symptoms for more than 2-3 days. °· You have a fever and your symptoms suddenly get  worse. °· You have signs or symptoms of infection. These include: °? Chills. °? Being more tired than normal (lethargy). °? Irritability. °? Poor eating. °? Throwing up (vomiting). °· You have pain that is not helped with medicine. °· You have shortness of breath. °· You have pain in your chest. °· You are coughing up pus-like or bloody mucus. °· You have a stiff neck. °· Your feet or hands swell or have pain. °· Your belly looks bloated. °· Your joints hurt. °This information is not intended to replace advice given to you by your health care provider. Make sure you discuss any questions you have with your health care provider. °Document Released: 03/29/2013 Document Revised: 11/14/2015 Document Reviewed: 01/18/2013 °Elsevier Interactive Patient Education © 2017 [employer] Inc. ° °

## 2018-05-17 LAB — COMPREHENSIVE METABOLIC PANEL
ALT: 42 IU/L (ref 0–44)
AST: 51 IU/L — ABNORMAL HIGH (ref 0–40)
Albumin/Globulin Ratio: 1.7 (ref 1.2–2.2)
Albumin: 4.7 g/dL (ref 3.5–5.5)
Alkaline Phosphatase: 67 IU/L (ref 39–117)
BUN/Creatinine Ratio: 17 (ref 9–20)
BUN: 14 mg/dL (ref 6–20)
Bilirubin Total: 0.9 mg/dL (ref 0.0–1.2)
CO2: 23 mmol/L (ref 20–29)
Calcium: 9.6 mg/dL (ref 8.7–10.2)
Chloride: 101 mmol/L (ref 96–106)
Creatinine, Ser: 0.83 mg/dL (ref 0.76–1.27)
GFR calc Af Amer: 135 mL/min/{1.73_m2} (ref >59)
GFR calc non Af Amer: 116 mL/min/{1.73_m2} (ref >59)
Globulin, Total: 2.8 g/dL (ref 1.5–4.5)
Glucose: 97 mg/dL (ref 65–99)
Potassium: 3.9 mmol/L (ref 3.5–5.2)
Sodium: 139 mmol/L (ref 134–144)
Total Protein: 7.5 g/dL (ref 6.0–8.5)

## 2018-05-17 LAB — CBC WITH DIFFERENTIAL/PLATELET
Basophils Absolute: 0.1 10*3/uL (ref 0.0–0.2)
Basos: 0 %
EOS (ABSOLUTE): 0.3 10*3/uL (ref 0.0–0.4)
Eos: 2 %
Hematocrit: 30.8 % — ABNORMAL LOW (ref 37.5–51.0)
Hemoglobin: 10.5 g/dL — ABNORMAL LOW (ref 13.0–17.7)
Immature Grans (Abs): 0.1 10*3/uL (ref 0.0–0.1)
Immature Granulocytes: 1 %
Lymphocytes Absolute: 6.5 10*3/uL — ABNORMAL HIGH (ref 0.7–3.1)
Lymphs: 32 %
MCH: 31.7 pg (ref 26.6–33.0)
MCHC: 34.1 g/dL (ref 31.5–35.7)
MCV: 93 fL (ref 79–97)
Monocytes Absolute: 1.4 10*3/uL — ABNORMAL HIGH (ref 0.1–0.9)
Monocytes: 7 %
NRBC: 1 % — ABNORMAL HIGH (ref 0–0)
Neutrophils Absolute: 11.9 10*3/uL — ABNORMAL HIGH (ref 1.4–7.0)
Neutrophils: 58 %
Platelets: 615 10*3/uL — ABNORMAL HIGH (ref 150–450)
RBC: 3.31 x10E6/uL — ABNORMAL LOW (ref 4.14–5.80)
RDW: 17.6 % — ABNORMAL HIGH (ref 12.3–15.4)
WBC: 20.3 10*3/uL (ref 3.4–10.8)

## 2018-05-18 ENCOUNTER — Telehealth: Payer: Self-pay

## 2018-05-19 LAB — DRUG SCREEN 764883 11+OXYCO+ALC+CRT-BUND
Amphetamines, Urine: NEGATIVE ng/mL
BENZODIAZ UR QL: NEGATIVE ng/mL
Barbiturate: NEGATIVE ng/mL
Cannabinoid Quant, Ur: NEGATIVE ng/mL
Creatinine: 61.2 mg/dL (ref 20.0–300.0)
Ethanol: NEGATIVE %
Meperidine: NEGATIVE ng/mL
Methadone Screen, Urine: NEGATIVE ng/mL
Oxycodone/Oxymorphone, Urine: NEGATIVE ng/mL
Phencyclidine: NEGATIVE ng/mL
Propoxyphene: NEGATIVE ng/mL
Tramadol: NEGATIVE ng/mL
pH, Urine: 5.7 (ref 4.5–8.9)

## 2018-05-19 LAB — COCAINE CONF, UR
Benzoylecgonine GC/MS Conf: 462 ng/mL
Cocaine Metab Quant, Ur: POSITIVE — AB

## 2018-05-19 LAB — OPIATES CONFIRMATION, URINE
Codeine: NEGATIVE
Hydrocodone: NEGATIVE
Hydromorphone: NEGATIVE
Morphine Confirm: >3000 ng/mL
Morphine: POSITIVE — AB
Opiates: POSITIVE ng/mL — AB

## 2018-05-23 NOTE — Telephone Encounter (Signed)
Patient given medication at last visit. He is positive for cocaine. Will not provider oxycodone until 2 negative UDS.

## 2018-05-30 ENCOUNTER — Other Ambulatory Visit: Payer: Medicaid Other

## 2018-05-30 DIAGNOSIS — G894 Chronic pain syndrome: Secondary | ICD-10-CM

## 2018-06-01 LAB — DRUG SCREEN 764883 11+OXYCO+ALC+CRT-BUND
Amphetamines, Urine: NEGATIVE ng/mL
BENZODIAZ UR QL: NEGATIVE ng/mL
Barbiturate: NEGATIVE ng/mL
Cannabinoid Quant, Ur: NEGATIVE ng/mL
Cocaine (Metabolite): NEGATIVE ng/mL
Creatinine: 70.6 mg/dL (ref 20.0–300.0)
Ethanol: NEGATIVE %
Meperidine: NEGATIVE ng/mL
Methadone Screen, Urine: NEGATIVE ng/mL
OPIATE SCREEN URINE: NEGATIVE ng/mL
Oxycodone/Oxymorphone, Urine: NEGATIVE ng/mL
Phencyclidine: NEGATIVE ng/mL
Propoxyphene: NEGATIVE ng/mL
Tramadol: NEGATIVE ng/mL
pH, Urine: 5.9 (ref 4.5–8.9)

## 2018-06-07 ENCOUNTER — Other Ambulatory Visit: Payer: Medicare Other

## 2018-06-07 DIAGNOSIS — D571 Sickle-cell disease without crisis: Secondary | ICD-10-CM | POA: Diagnosis not present

## 2018-06-09 LAB — DRUG SCREEN 764883 11+OXYCO+ALC+CRT-BUND
Amphetamines, Urine: NEGATIVE ng/mL
BENZODIAZ UR QL: NEGATIVE ng/mL
Barbiturate: NEGATIVE ng/mL
Cannabinoid Quant, Ur: NEGATIVE ng/mL
Cocaine (Metabolite): NEGATIVE ng/mL
Creatinine: 11.2 mg/dL — ABNORMAL LOW (ref 20.0–300.0)
Ethanol: NEGATIVE %
Meperidine: NEGATIVE ng/mL
Methadone Screen, Urine: NEGATIVE ng/mL
OPIATE SCREEN URINE: NEGATIVE ng/mL
Oxycodone/Oxymorphone, Urine: NEGATIVE ng/mL
Phencyclidine: NEGATIVE ng/mL
Propoxyphene: NEGATIVE ng/mL
Tramadol: NEGATIVE ng/mL
pH, Urine: 6.7 (ref 4.5–8.9)

## 2018-06-09 LAB — SPECIFIC GRAVITY: Specific Gravity: 1.0021

## 2018-06-13 ENCOUNTER — Ambulatory Visit (INDEPENDENT_AMBULATORY_CARE_PROVIDER_SITE_OTHER): Payer: Medicare Other | Admitting: Family Medicine

## 2018-06-13 ENCOUNTER — Encounter: Payer: Self-pay | Admitting: Family Medicine

## 2018-06-13 VITALS — BP 118/70 | HR 75 | Temp 98.4°F | Resp 14 | Ht 68.0 in | Wt 174.0 lb

## 2018-06-13 DIAGNOSIS — G894 Chronic pain syndrome: Secondary | ICD-10-CM

## 2018-06-13 DIAGNOSIS — D571 Sickle-cell disease without crisis: Secondary | ICD-10-CM

## 2018-06-13 LAB — POCT URINALYSIS DIPSTICK
Bilirubin, UA: NEGATIVE
Blood, UA: NEGATIVE
Glucose, UA: NEGATIVE
Ketones, UA: NEGATIVE
Leukocytes, UA: NEGATIVE
Nitrite, UA: NEGATIVE
Protein, UA: NEGATIVE
Spec Grav, UA: 1.01 (ref 1.010–1.025)
Urobilinogen, UA: 1 E.U./dL
pH, UA: 6 (ref 5.0–8.0)

## 2018-06-13 MED ORDER — OXYCODONE HCL 20 MG PO TABS: 20.0000 mg | ORAL_TABLET | Freq: Four times a day (QID) | ORAL | 0 refills | Status: DC | PRN

## 2018-06-13 MED FILL — oxyCODONE HCL 20 MG TABS: 20 | 15 days supply | Qty: 60 | Fill #0

## 2018-06-13 NOTE — Patient Instructions (Signed)
Sickle Cell Anemia, Adult °Sickle cell anemia is a condition where your red blood cells are shaped like sickles. Red blood cells carry oxygen through the body. Sickle-shaped cells do not live as long as normal red blood cells. They also clump together and block blood from flowing through the blood vessels. This prevents the body from getting enough oxygen. Sickle cell anemia causes organ damage and pain. It also increases the risk of infection. °Follow these instructions at home: °Medicines °· Take over-the-counter and prescription medicines only as told by your doctor. °· If you were prescribed an antibiotic medicine, take it as told by your doctor. Do not stop taking the antibiotic even if you start to feel better. °· If you develop a fever, do not take medicines to lower the fever right away. Tell your doctor about the fever. °Managing pain, stiffness, and swelling °· Try these methods to help with pain: °? Use a heating pad. °? Take a warm bath. °? Distract yourself, such as by watching TV. °Eating and drinking °· Drink enough fluid to keep your pee (urine) clear or pale yellow. Drink more in hot weather and during exercise. °· Limit or avoid alcohol. °· Eat a healthy diet. Eat plenty of fruits, vegetables, whole grains, and lean protein. °· Take vitamins and supplements as told by your doctor. °Traveling °· When traveling, keep these with you: °? Your medical information. °? The names of your doctors. °? Your medicines. °· If you need to take an airplane, talk to your doctor first. °Activity °· Rest often. °· Avoid exercises that make your heart beat much faster, such as jogging. °General instructions °· Do not use products that have nicotine or tobacco, such as cigarettes and e-cigarettes. If you need help quitting, ask your doctor. °· Consider wearing a medical alert bracelet. °· Avoid being in high places (high altitudes), such as mountains. °· Avoid very hot or cold temperatures. °· Avoid places where the  temperature changes a lot. °· Keep all follow-up visits as told by your doctor. This is important. °Contact a doctor if: °· A joint hurts. °· Your feet or hands hurt or swell. °· You feel tired (fatigued). °Get help right away if: °· You have symptoms of infection. These include: °? Fever. °? Chills. °? Being very tired. °? Irritability. °? Poor eating. °? Throwing up (vomiting). °· You feel dizzy or faint. °· You have new stomach pain, especially on the left side. °· You have a an erection (priapism) that lasts more than 4 hours. °· You have numbness in your arms or legs. °· You have a hard time moving your arms or legs. °· You have trouble talking. °· You have pain that does not go away when you take medicine. °· You are short of breath. °· You are breathing fast. °· You have a long-term cough. °· You have pain in your chest. °· You have a bad headache. °· You have a stiff neck. °· Your stomach looks bloated even though you did not eat much. °· Your skin is pale. °· You suddenly cannot see well. °Summary °· Sickle cell anemia is a condition where your red blood cells are shaped like sickles. °· Follow your doctor's advice on ways to manage pain, food to eat, activities to do, and steps to take for safe travel. °· Get medical help right away if you have any signs of infection, such as a fever. °This information is not intended to replace advice given to you by your   health care provider. Make sure you discuss any questions you have with your health care provider. °Document Released: 03/29/2013 Document Revised: 07/14/2016 Document Reviewed: 07/14/2016 °Elsevier Interactive Patient Education © 2019 [employer] Inc. ° °

## 2018-06-13 NOTE — Progress Notes (Signed)
PATIENT CARE CENTER INTERNAL MEDICINE AND SICKLE CELL CARE  SICKLE CELL ANEMIA FOLLOW UP VISIT PROVIDER: Lanae Boast, FNP    Subjective:   Alejandro Soto  is a 32 y.o.  male who  has a past medical history of Pneumonia and Sickle cell anemia ([place]). presents for a follow up for Sickle Cell Anemia. The patient has had 0 admissions in the past 6 months.  Pain regimen includes: Ibuprofen and oxycodone 20 mg po Q6h PRN. Has been on hold due to positive drug screen- cocaine. Was given tylenol 3 at last visit.  Hydrea Therapy: Yes Medication compliance: Yes  Pain today is 5/10.  The patient reports adequate daily hydration.    Review of Systems  Constitutional: Negative.   HENT: Negative.   Eyes: Negative.   Respiratory: Negative.   Cardiovascular: Negative.   Gastrointestinal: Negative.   Genitourinary: Negative.   Musculoskeletal: Negative.   Skin: Negative.   Neurological: Negative.   Psychiatric/Behavioral: Negative.     Objective:   Objective  BP 118/70 (BP Location: Left Arm, Patient Position: Sitting, Cuff Size: Normal)   Pulse 75   Temp 98.4 F (36.9 C) (Oral)   Resp 14   Ht 5\' 8"  (1.727 m)   Wt 174 lb (78.9 kg)   SpO2 100%   BMI 26.46 kg/m   Wt Readings from Last 3 Encounters:  06/13/18 174 lb (78.9 kg)  05/16/18 167 lb (75.8 kg)  04/14/18 166 lb (75.3 kg)     Physical Exam Vitals signs and nursing note reviewed.  Constitutional:      General: He is not in acute distress.    Appearance: He is well-developed.  HENT:     Head: Normocephalic and atraumatic.  Eyes:     Conjunctiva/sclera: Conjunctivae normal.     Pupils: Pupils are equal, round, and reactive to light.  Neck:     Musculoskeletal: Normal range of motion.  Cardiovascular:     Rate and Rhythm: Normal rate and regular rhythm.     Heart sounds: Murmur present.  Pulmonary:     Effort: Pulmonary effort is normal. No respiratory distress.     Breath sounds: Normal breath sounds.    Abdominal:     General: Bowel sounds are normal. There is no distension.     Palpations: Abdomen is soft.  Musculoskeletal: Normal range of motion.  Skin:    General: Skin is warm and dry.  Neurological:     Mental Status: He is alert and oriented to person, place, and time.  Psychiatric:        Mood and Affect: Mood normal.        Behavior: Behavior normal.        Thought Content: Thought content normal.        Judgment: Judgment normal.      Assessment/Plan:   Assessment   Encounter Diagnosis  Name Primary?  . Sickle cell disease, type SS ([place]) Yes     Plan  1. Sickle cell disease, type SS ([place]) Will do trial of restarting medication due to hx of positive cocaine on UDS. Last 2 UDS have been negative.  - Urinalysis Dipstick - Oxycodone HCl 20 MG TABS; Take 1 tablet (20 mg total) by mouth every 6 (six) hours as needed for up to 15 days.  Dispense: 60 tablet; Refill: 0 - Basic Metabolic Panel  2. Chronic pain syndrome - Oxycodone HCl 20 MG TABS; Take 1 tablet (20 mg total) by mouth every 6 (six) hours as  needed for up to 15 days.  Dispense: 60 tablet; Refill: 0   Return to care as scheduled and prn. Patient verbalized understanding and agreed with plan of care.   1. Sickle cell disease - Continue Hydrea   We discussed the need for good hydration, monitoring of hydration status, avoidance of heat, cold, stress, and infection triggers. We discussed the risks and benefits of Hydrea, including bone marrow suppression, the possibility of GI upset, skin ulcers, hair thinning, and teratogenicity. The patient was reminded of the need to seek medical attention of any symptoms of bleeding, anemia, or infection. Continue folic acid 1 mg daily to prevent aplastic bone marrow crises.   2. Pulmonary evaluation - Patient denies severe recurrent wheezes, shortness of breath with exercise, or persistent cough. If these symptoms develop, pulmonary function tests with spirometry will be  ordered, and if abnormal, plan on referral to Pulmonology for further evaluation.  3. Cardiac - Routine screening for pulmonary hypertension is not recommended.  4. Eye - High risk of proliferative retinopathy. Annual eye exam with retinal exam recommended to patient.  5. Immunization status -  Yearly influenza vaccination is recommended, as well as being up to date with Meningococcal and Pneumococcal vaccines.   6. Acute and chronic painful episodes - We discussed that pt is to receive Schedule II prescriptions only from [place]. Pt is also aware that the prescription history is available to [place] online through the [facility] CSRS. Controlled substance agreement signed. We reminded Tennyson Wacha that all patients receiving Schedule II narcotics must be seen for follow within one month of prescription being requested. We reviewed the terms of our pain agreement, including the need to keep medicines in a safe locked location away from children or pets, and the need to report excess sedation or constipation, measures to avoid constipation, and policies related to early refills and stolen prescriptions. According to the [place] Chronic Pain Initiative program, we have reviewed details related to analgesia, adverse effects, aberrant behaviors.  7. Iron overload from chronic transfusion.  Not applicable at this time.  If this occurs will use Exjade for management.   8. Vitamin D deficiency - Drisdol 50,000 units weekly. Patient encouraged to take as prescribed.   The above recommendations are taken from the NIH Evidence-Based Management of Sickle Cell Disease: Expert Panel Report, 20149.   [name], FNP-BC Patient [facility] [address] [place], [place] [address] [phone]  This note has been created with Dragon speech recognition software and smart phrase technology. Any transcriptional errors are unintentional.

## 2018-06-14 LAB — BASIC METABOLIC PANEL
BUN/Creatinine Ratio: 17 (ref 9–20)
BUN: 13 mg/dL (ref 6–20)
CO2: 20 mmol/L (ref 20–29)
Calcium: 9.8 mg/dL (ref 8.7–10.2)
Chloride: 101 mmol/L (ref 96–106)
Creatinine, Ser: 0.78 mg/dL (ref 0.76–1.27)
GFR calc Af Amer: 138 mL/min/{1.73_m2} (ref >59)
GFR calc non Af Amer: 119 mL/min/{1.73_m2} (ref >59)
Glucose: 87 mg/dL (ref 65–99)
Potassium: 4.6 mmol/L (ref 3.5–5.2)
Sodium: 138 mmol/L (ref 134–144)

## 2018-06-23 ENCOUNTER — Telehealth: Payer: Self-pay

## 2018-06-23 DIAGNOSIS — G894 Chronic pain syndrome: Secondary | ICD-10-CM

## 2018-06-23 DIAGNOSIS — D571 Sickle-cell disease without crisis: Secondary | ICD-10-CM

## 2018-06-23 MED ORDER — OXYCODONE HCL 20 MG PO TABS: 20.0000 mg | ORAL_TABLET | Freq: Four times a day (QID) | ORAL | 0 refills | Status: AC | PRN

## 2018-06-23 NOTE — Telephone Encounter (Signed)
Sent to [facility] in [place]. 06/23/2018 cannot be filled prior to 06/28/2018

## 2018-07-14 ENCOUNTER — Ambulatory Visit (INDEPENDENT_AMBULATORY_CARE_PROVIDER_SITE_OTHER): Payer: Medicare Other | Admitting: Family Medicine

## 2018-07-14 ENCOUNTER — Encounter: Payer: Self-pay | Admitting: Family Medicine

## 2018-07-14 VITALS — BP 118/63 | HR 86 | Temp 98.7°F | Resp 14 | Ht 68.0 in | Wt 178.0 lb

## 2018-07-14 DIAGNOSIS — T402X5A Adverse effect of other opioids, initial encounter: Secondary | ICD-10-CM | POA: Diagnosis not present

## 2018-07-14 DIAGNOSIS — M792 Neuralgia and neuritis, unspecified: Secondary | ICD-10-CM | POA: Diagnosis not present

## 2018-07-14 DIAGNOSIS — Z79891 Long term (current) use of opiate analgesic: Secondary | ICD-10-CM | POA: Diagnosis not present

## 2018-07-14 DIAGNOSIS — D571 Sickle-cell disease without crisis: Secondary | ICD-10-CM | POA: Diagnosis not present

## 2018-07-14 DIAGNOSIS — K5903 Drug induced constipation: Secondary | ICD-10-CM

## 2018-07-14 LAB — POCT URINALYSIS DIPSTICK
Bilirubin, UA: NEGATIVE
Blood, UA: NEGATIVE
Glucose, UA: NEGATIVE
Ketones, UA: NEGATIVE
Leukocytes, UA: NEGATIVE
Nitrite, UA: NEGATIVE
Protein, UA: NEGATIVE
Spec Grav, UA: 1.015 (ref 1.010–1.025)
Urobilinogen, UA: 0.2 E.U./dL
pH, UA: 6.5 (ref 5.0–8.0)

## 2018-07-14 MED ORDER — NALOXEGOL OXALATE 25 MG PO TABS: 25.0000 mg | ORAL_TABLET | Freq: Every day | ORAL | 2 refills | Status: DC

## 2018-07-14 MED ORDER — GABAPENTIN 600 MG PO TABS: 600.0000 mg | ORAL_TABLET | Freq: Two times a day (BID) | ORAL | 0 refills | Status: DC

## 2018-07-14 MED ORDER — OXYCODONE HCL 20 MG PO TABS: 1.0000 | ORAL_TABLET | Freq: Four times a day (QID) | ORAL | 0 refills | Status: DC | PRN

## 2018-07-14 MED ORDER — HYDROXYUREA 500 MG PO CAPS: 1500.0000 mg | ORAL_CAPSULE | Freq: Every day | ORAL | 3 refills | Status: DC

## 2018-07-14 MED FILL — GABAPENTIN 600 MG TABLET: 600 | 30 days supply | Qty: 60 | Fill #0

## 2018-07-14 MED FILL — HYDROXYUREA 500 MG CAPSULE: 500 | 90 days supply | Qty: 270 | Fill #0

## 2018-07-14 MED FILL — MOVANTIK 25 MG TABLET: 25 | 30 days supply | Qty: 30 | Fill #0

## 2018-07-14 MED FILL — oxyCODONE HCL 20 MG TABS: 20 | 15 days supply | Qty: 60 | Fill #0

## 2018-07-14 NOTE — Progress Notes (Signed)
PATIENT CARE CENTER INTERNAL MEDICINE AND SICKLE CELL CARE  SICKLE CELL ANEMIA FOLLOW UP VISIT PROVIDER: Lanae Boast, FNP    Subjective:   Alejandro Soto  is a 33 y.o.  male who  has a past medical history of Pneumonia and Sickle cell anemia ([place]). presents for a follow up for Sickle Cell Anemia. The patient has had 0 admissions in the past 6 months.  He states that he has chronic constipation due to opioid use. He states that he drinks adequate amounts of water, eats considerably healthy and uses herbal supplements. Patient states that his renal function was altered in the past due to taking otc creatine supplement for weight gain.  Pain regimen includes: Ibuprofen and oxycodone  Hydrea Therapy: Yes Medication compliance: Yes  Pain today is 0/10.     Review of Systems  Constitutional: Negative.   HENT: Negative.   Eyes: Negative.   Respiratory: Negative.   Cardiovascular: Negative.   Gastrointestinal: Negative.   Genitourinary: Negative.   Musculoskeletal: Negative.   Skin: Negative.   Neurological: Negative.   Psychiatric/Behavioral: Negative.     Objective:   Objective  BP 118/63 (BP Location: Left Arm, Patient Position: Sitting, Cuff Size: Normal)   Pulse 86   Temp 98.7 F (37.1 C) (Oral)   Resp 14   Ht 5\' 8"  (1.727 m)   Wt 178 lb (80.7 kg)   SpO2 100%   BMI 27.06 kg/m   Wt Readings from Last 3 Encounters:  07/14/18 178 lb (80.7 kg)  06/13/18 174 lb (78.9 kg)  05/16/18 167 lb (75.8 kg)     Physical Exam Vitals signs and nursing note reviewed.  Constitutional:      General: He is not in acute distress.    Appearance: He is well-developed.  HENT:     Head: Normocephalic and atraumatic.  Eyes:     Conjunctiva/sclera: Conjunctivae normal.     Pupils: Pupils are equal, round, and reactive to light.  Neck:     Musculoskeletal: Normal range of motion.  Cardiovascular:     Rate and Rhythm: Normal rate and regular rhythm.     Pulses: Normal pulses.   Heart sounds: Murmur present.  Pulmonary:     Effort: Pulmonary effort is normal. No respiratory distress.     Breath sounds: Normal breath sounds.  Abdominal:     General: Bowel sounds are normal. There is no distension.     Palpations: Abdomen is soft.  Musculoskeletal: Normal range of motion.  Skin:    General: Skin is warm and dry.  Neurological:     Mental Status: He is alert and oriented to person, place, and time. Mental status is at baseline.  Psychiatric:        Attention and Perception: Attention and perception normal.        Mood and Affect: Mood normal. Affect is flat.        Speech: Speech normal.        Behavior: Behavior normal. Behavior is cooperative.        Thought Content: Thought content normal.        Cognition and Memory: Cognition and memory normal.      Assessment/Plan:   Assessment   Encounter Diagnoses  Name Primary?  . Sickle cell disease, type SS ([place]) Yes  . Neuropathic pain   . Constipation due to opioid therapy   . Chronic prescription opiate use      Plan   1. Sickle cell disease, type SS ([place])  No medication changes warranted today.  - Urinalysis Dipstick - hydroxyurea (HYDREA) 500 MG capsule; Take 3 capsules (1,500 mg total) by mouth daily. May take with food to minimize GI side effects.  Dispense: 270 capsule; Refill: 3 - Oxycodone HCl 20 MG TABS; Take 1 tablet (20 mg total) by mouth every 6 (six) hours as needed for up to 15 days.  Dispense: 60 tablet; Refill: 0  2. Neuropathic pain Discussed different types of pain. Discussed eating well and getting rest.  - gabapentin (NEURONTIN) 600 MG tablet; Take 1 tablet (600 mg total) by mouth 2 (two) times daily for 30 days.  Dispense: 60 tablet; Refill: 0  3. Constipation due to opioid therapy Start movantik.  - naloxegol oxalate (MOVANTIK) 25 MG TABS tablet; Take 1 tablet (25 mg total) by mouth daily.  Dispense: 30 tablet; Refill: 2  4. Chronic prescription opiate use Patient with negative  UDS at last visit. Will continue to monitor.  - Oxycodone HCl 20 MG TABS; Take 1 tablet (20 mg total) by mouth every 6 (six) hours as needed for up to 15 days.  Dispense: 60 tablet; Refill: 0    Return to care as scheduled and prn. Patient verbalized understanding and agreed with plan of care.   1. Sickle cell disease - Continue Hydrea  We discussed the need for good hydration, monitoring of hydration status, avoidance of heat, cold, stress, and infection triggers. We discussed the risks and benefits of Hydrea, including bone marrow suppression, the possibility of GI upset, skin ulcers, hair thinning, and teratogenicity. The patient was reminded of the need to seek medical attention of any symptoms of bleeding, anemia, or infection. Continue folic acid 1 mg daily to prevent aplastic bone marrow crises.   2. Pulmonary evaluation - Patient denies severe recurrent wheezes, shortness of breath with exercise, or persistent cough. If these symptoms develop, pulmonary function tests with spirometry will be ordered, and if abnormal, plan on referral to Pulmonology for further evaluation.  3. Cardiac - Routine screening for pulmonary hypertension is not recommended.  4. Eye - High risk of proliferative retinopathy. Annual eye exam with retinal exam recommended to patient.  5. Immunization status -  Yearly influenza vaccination is recommended, as well as being up to date with Meningococcal and Pneumococcal vaccines.   6. Acute and chronic painful episodes - We discussed that pt is to receive Schedule II prescriptions only from [place]. Pt is also aware that the prescription history is available to [place] online through the [facility] CSRS. Controlled substance agreement signed. We reminded [name] that all patients receiving Schedule II narcotics must be seen for follow within one month of prescription being requested. We reviewed the terms of our pain agreement, including the need to keep medicines in a safe locked  location away from children or pets, and the need to report excess sedation or constipation, measures to avoid constipation, and policies related to early refills and stolen prescriptions. According to the [place] Chronic Pain Initiative program, we have reviewed details related to analgesia, adverse effects, aberrant behaviors.  7. Iron overload from chronic transfusion.  Not applicable at this time.  If this occurs will use Exjade for management.   8. Vitamin D deficiency - Drisdol 50,000 units weekly. Patient encouraged to take as prescribed.   The above recommendations are taken from the NIH Evidence-Based Management of Sickle Cell Disease: Expert Panel Report, 20149.   [name], FNP-BC Patient [facility]  [address], [place] [address] [phone]  This note has been created with Dragon speech recognition software and smart phrase technology. Any transcriptional errors are unintentional.

## 2018-07-14 NOTE — Patient Instructions (Signed)
Sickle Cell Anemia, Adult °Sickle cell anemia is a condition where your red blood cells are shaped like sickles. Red blood cells carry oxygen through the body. Sickle-shaped cells do not live as long as normal red blood cells. They also clump together and block blood from flowing through the blood vessels. This prevents the body from getting enough oxygen. Sickle cell anemia causes organ damage and pain. It also increases the risk of infection. °Follow these instructions at home: °Medicines °· Take over-the-counter and prescription medicines only as told by your doctor. °· If you were prescribed an antibiotic medicine, take it as told by your doctor. Do not stop taking the antibiotic even if you start to feel better. °· If you develop a fever, do not take medicines to lower the fever right away. Tell your doctor about the fever. °Managing pain, stiffness, and swelling °· Try these methods to help with pain: °? Use a heating pad. °? Take a warm bath. °? Distract yourself, such as by watching TV. °Eating and drinking °· Drink enough fluid to keep your pee (urine) clear or pale yellow. Drink more in hot weather and during exercise. °· Limit or avoid alcohol. °· Eat a healthy diet. Eat plenty of fruits, vegetables, whole grains, and lean protein. °· Take vitamins and supplements as told by your doctor. °Traveling °· When traveling, keep these with you: °? Your medical information. °? The names of your doctors. °? Your medicines. °· If you need to take an airplane, talk to your doctor first. °Activity °· Rest often. °· Avoid exercises that make your heart beat much faster, such as jogging. °General instructions °· Do not use products that have nicotine or tobacco, such as cigarettes and e-cigarettes. If you need help quitting, ask your doctor. °· Consider wearing a medical alert bracelet. °· Avoid being in high places (high altitudes), such as mountains. °· Avoid very hot or cold temperatures. °· Avoid places where the  temperature changes a lot. °· Keep all follow-up visits as told by your doctor. This is important. °Contact a doctor if: °· A joint hurts. °· Your feet or hands hurt or swell. °· You feel tired (fatigued). °Get help right away if: °· You have symptoms of infection. These include: °? Fever. °? Chills. °? Being very tired. °? Irritability. °? Poor eating. °? Throwing up (vomiting). °· You feel dizzy or faint. °· You have new stomach pain, especially on the left side. °· You have a an erection (priapism) that lasts more than 4 hours. °· You have numbness in your arms or legs. °· You have a hard time moving your arms or legs. °· You have trouble talking. °· You have pain that does not go away when you take medicine. °· You are short of breath. °· You are breathing fast. °· You have a long-term cough. °· You have pain in your chest. °· You have a bad headache. °· You have a stiff neck. °· Your stomach looks bloated even though you did not eat much. °· Your skin is pale. °· You suddenly cannot see well. °Summary °· Sickle cell anemia is a condition where your red blood cells are shaped like sickles. °· Follow your doctor's advice on ways to manage pain, food to eat, activities to do, and steps to take for safe travel. °· Get medical help right away if you have any signs of infection, such as a fever. °This information is not intended to replace advice given to you by your   health care provider. Make sure you discuss any questions you have with your health care provider. °Document Released: 03/29/2013 Document Revised: 07/14/2016 Document Reviewed: 07/14/2016 °Elsevier Interactive Patient Education © 2019 [employer] Inc. ° °

## 2018-07-25 ENCOUNTER — Telehealth: Payer: Self-pay

## 2018-07-25 NOTE — Telephone Encounter (Signed)
Close for admin purpose

## 2018-07-28 ENCOUNTER — Telehealth: Payer: Self-pay

## 2018-07-28 DIAGNOSIS — D571 Sickle-cell disease without crisis: Secondary | ICD-10-CM

## 2018-07-28 DIAGNOSIS — Z79891 Long term (current) use of opiate analgesic: Secondary | ICD-10-CM

## 2018-07-28 MED ORDER — OXYCODONE HCL 20 MG PO TABS: 1.0000 | ORAL_TABLET | Freq: Four times a day (QID) | ORAL | 0 refills | Status: DC | PRN

## 2018-07-28 NOTE — Telephone Encounter (Signed)
refilled 

## 2018-08-01 ENCOUNTER — Encounter (HOSPITAL_COMMUNITY): Payer: Self-pay

## 2018-08-01 ENCOUNTER — Emergency Department (HOSPITAL_COMMUNITY): Payer: Medicare Other

## 2018-08-01 ENCOUNTER — Emergency Department (HOSPITAL_COMMUNITY)
Admission: EM | Admit: 2018-08-01 | Discharge: 2018-08-02 | Discharge status: Against Medical Advice | Payer: Medicare Other | Attending: Emergency Medicine | Admitting: Emergency Medicine

## 2018-08-01 DIAGNOSIS — R52 Pain, unspecified: Secondary | ICD-10-CM | POA: Diagnosis not present

## 2018-08-01 DIAGNOSIS — Z79899 Other long term (current) drug therapy: Secondary | ICD-10-CM | POA: Diagnosis not present

## 2018-08-01 DIAGNOSIS — R05 Cough: Secondary | ICD-10-CM | POA: Diagnosis not present

## 2018-08-01 DIAGNOSIS — F17210 Nicotine dependence, cigarettes, uncomplicated: Secondary | ICD-10-CM | POA: Diagnosis not present

## 2018-08-01 DIAGNOSIS — D5700 Hb-SS disease with crisis, unspecified: Secondary | ICD-10-CM | POA: Diagnosis not present

## 2018-08-01 LAB — RETICULOCYTES
Immature Retic Fract: 46.9 % — ABNORMAL HIGH (ref 2.3–15.9)
RBC.: 2.76 MIL/uL — AB (ref 4.22–5.81)
RETIC COUNT ABSOLUTE: 359.6 10*3/uL — AB (ref 19.0–186.0)
Retic Ct Pct: 13 % — ABNORMAL HIGH (ref 0.4–3.1)

## 2018-08-01 LAB — CBC WITH DIFFERENTIAL/PLATELET
Abs Immature Granulocytes: 0.57 10*3/uL — ABNORMAL HIGH (ref 0.00–0.07)
Basophils Absolute: 0.1 10*3/uL (ref 0.0–0.1)
Basophils Relative: 1 %
Eosinophils Absolute: 0.6 10*3/uL — ABNORMAL HIGH (ref 0.0–0.5)
Eosinophils Relative: 3 %
HCT: 27.7 % — ABNORMAL LOW (ref 39.0–52.0)
HEMOGLOBIN: 9.1 g/dL — AB (ref 13.0–17.0)
Immature Granulocytes: 2 %
Lymphocytes Relative: 29 %
Lymphs Abs: 7.1 10*3/uL — ABNORMAL HIGH (ref 0.7–4.0)
MCH: 33 pg (ref 26.0–34.0)
MCHC: 32.9 g/dL (ref 30.0–36.0)
MCV: 100.4 fL — ABNORMAL HIGH (ref 80.0–100.0)
Monocytes Absolute: 2.6 10*3/uL — ABNORMAL HIGH (ref 0.1–1.0)
Monocytes Relative: 11 %
NEUTROS PCT: 54 %
Neutro Abs: 13.4 10*3/uL — ABNORMAL HIGH (ref 1.7–7.7)
Platelets: 252 10*3/uL (ref 150–400)
RBC: 2.76 MIL/uL — AB (ref 4.22–5.81)
RDW: 19.1 % — ABNORMAL HIGH (ref 11.5–15.5)
WBC: 24.3 10*3/uL — ABNORMAL HIGH (ref 4.0–10.5)
nRBC: 5.4 % — ABNORMAL HIGH (ref 0.0–0.2)

## 2018-08-01 LAB — COMPREHENSIVE METABOLIC PANEL
ALT: 38 U/L (ref 0–44)
ANION GAP: 9 (ref 5–15)
AST: 46 U/L — ABNORMAL HIGH (ref 15–41)
Albumin: 4.1 g/dL (ref 3.5–5.0)
Alkaline Phosphatase: 60 U/L (ref 38–126)
BUN: 8 mg/dL (ref 6–20)
CO2: 26 mmol/L (ref 22–32)
Calcium: 8.7 mg/dL — ABNORMAL LOW (ref 8.9–10.3)
Chloride: 104 mmol/L (ref 98–111)
Creatinine, Ser: 0.63 mg/dL (ref 0.61–1.24)
GFR calc non Af Amer: >60 mL/min (ref >60)
Glucose, Bld: 120 mg/dL — ABNORMAL HIGH (ref 70–99)
Potassium: 3.8 mmol/L (ref 3.5–5.1)
Sodium: 139 mmol/L (ref 135–145)
Total Bilirubin: 1.4 mg/dL — ABNORMAL HIGH (ref 0.3–1.2)
Total Protein: 7.3 g/dL (ref 6.5–8.1)

## 2018-08-01 MED ORDER — ONDANSETRON HCL 4 MG/2ML IJ SOLN: 4.0000 mg | INTRAMUSCULAR | Status: DC | PRN

## 2018-08-01 MED ORDER — DIPHENHYDRAMINE HCL 25 MG PO CAPS: 25.0000 mg | ORAL_CAPSULE | Freq: Four times a day (QID) | ORAL | Status: DC | PRN

## 2018-08-01 MED ORDER — DEXTROSE-NACL 5-0.45 % IV SOLN
INTRAVENOUS | Status: DC
  Administered 2018-08-01: 23:00:00 via INTRAVENOUS

## 2018-08-01 MED ORDER — HYDROMORPHONE HCL 1 MG/ML IJ SOLN
1.0000 mg | Freq: Once | INTRAMUSCULAR | Status: DC
  Filled 2018-08-01: qty 1

## 2018-08-01 MED ORDER — KETOROLAC TROMETHAMINE 30 MG/ML IJ SOLN
30.0000 mg | INTRAMUSCULAR | Status: AC
  Administered 2018-08-01: 30 mg via INTRAVENOUS
  Filled 2018-08-01: qty 1

## 2018-08-01 MED ORDER — SODIUM CHLORIDE 0.9% FLUSH: 3.0000 mL | Freq: Once | INTRAVENOUS | Status: DC

## 2018-08-01 NOTE — ED Triage Notes (Signed)
Pt complains of joint pain al over for three days and a nonproductive dry cough that is making his back hurt in the center

## 2018-08-01 NOTE — ED Provider Notes (Signed)
[facility] DEPT Provider Note   CSN: [id number] Arrival date & time: 08/01/18  1955     History   Chief Complaint Chief Complaint  Patient presents with  . Sickle Cell Pain Crisis    HPI Alejandro Soto is a 33 y.o. male.   33 year old male with a history of sickle cell SS anemia presents to the emergency department for evaluation of joint pain.  He states that he has been experiencing pain in all of his joints as well as his legs and back for the past 3 days.  He has been using his Neurontin as well as ibuprofen and oxycodone for management of his pain without relief.  He has had some sporadic vomiting associated with his crisis.  Triage note references a dry, nonproductive cough.  He states that this cough has been sporadic, but he denies any pleuritic type pain as noted in the triage note.  Specifically, denies fever, chest pain, and shortness of breath.  The history is provided by the patient. No language interpreter was used.  Sickle Cell Pain Crisis    Past Medical History:  Diagnosis Date  . Pneumonia   . Sickle cell anemia [facility])     Patient Active Problem List   Diagnosis Date Noted  . 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 Chronic back pain greater than 3 months duration 04/09/2015  . Sickle cell pain crisis ([place]) 12/24/2014  . Abdominal pain, epigastric 12/24/2014  . Thrombocytosis ([place]) 07/14/2014  . Leukocytosis 07/14/2014  . Anemia 07/14/2014  . Sore throat 07/03/2014  . Sebaceous cyst 07/03/2014  . Atopic dermatitis 06/12/2014  . Tobacco dependence 04/25/2014  . Neck pain, bilateral 03/01/2014  . Hb-SS disease without crisis ([place]) 02/22/2014  . DJD (degenerative joint disease), lumbosacral 09/18/2013  . Vitamin D deficiency 09/18/2013  . Neuropathic pain 09/18/2013  . Back pain, acute 08/11/2013  . Opiate use 05/24/2013  . Chronic pain syndrome 05/24/2013  . Sickle cell disease, type SS ([place]) 05/24/2013  . Back pain 11/01/2012   . Pain in joint, shoulder region 11/01/2012  . Pain in joint, lower leg 11/01/2012    Past Surgical History:  Procedure Laterality Date  . NO PAST SURGERIES          Home Medications    Prior to Admission medications   Medication Sig Start Date End Date Taking? Authorizing Provider  folic acid (FOLVITE) 1 MG tablet Take 1 tablet (1 mg total) by mouth daily. 08/14/16  Yes Dorena Dew, FNP  gabapentin (NEURONTIN) 600 MG tablet Take 1 tablet (600 mg total) by mouth 2 (two) times daily for 30 days. 07/14/18 08/13/18 Yes Lanae Boast, FNP  hydroxyurea (HYDREA) 500 MG capsule Take 3 capsules (1,500 mg total) by mouth daily. May take with food to minimize GI side effects. 07/14/18  Yes Lanae Boast, FNP  ibuprofen (ADVIL,MOTRIN) 800 MG tablet Take 1 tablet (800 mg total) by mouth every 8 (eight) hours as needed for mild pain. 05/16/18  Yes Lanae Boast, FNP  Multiple Vitamin (MULTIVITAMIN) tablet Take 2 tablets by mouth daily.    Yes [provider]  naloxegol oxalate (MOVANTIK) 25 MG TABS tablet Take 1 tablet (25 mg total) by mouth daily. 07/14/18  Yes Lanae Boast, FNP  Oxycodone HCl 20 MG TABS Take 1 tablet (20 mg total) by mouth every 6 (six) hours as needed for up to 15 days. 07/28/18 08/12/18 Yes Lanae Boast, FNP  triamcinolone (KENALOG) 0.025 % ointment Apply 1 application topically 2 (  two) times daily. 04/14/18  Yes Lanae Boast, FNP  busPIRone (BUSPAR) 5 MG tablet Take 1 tablet (5 mg total) by mouth 2 (two) times daily. Patient not taking: Reported on 01/20/2018 12/01/17   Dorena Dew, FNP  L-glutamine (ENDARI) 5 g PACK Powder Packet Take 10 g by mouth 2 (two) times daily. Patient not taking: Reported on 08/01/2018 12/16/16   Dorena Dew, FNP    Family History Family History  Problem Relation Age of Onset  . Cancer Maternal Grandmother        colon   . Diabetes Father   . Cancer Father 32       Prostate  . Asthma Brother     Social  History Social History   Tobacco Use  . Smoking status: Current Some Day Smoker    Packs/day: 0.25    Years: 5.00    Pack years: 1.25    Types: Cigarettes  . Smokeless tobacco: Never Used  Substance Use Topics  . Alcohol use: No    Alcohol/week: 0.0 standard drinks  . Drug use: No     Allergies   Amoxicillin   Review of Systems Review of Systems Ten systems reviewed and are negative for acute change, except as noted in the HPI.    Physical Exam Updated Vital Signs BP 111/89   Pulse (!) 116   Temp 99.2 F (37.3 C) (Oral)   Resp 19   Ht 5\' 8"  (1.727 m)   Wt 80.7 kg   SpO2 90%   BMI 27.06 kg/m   Physical Exam Vitals signs and nursing note reviewed.  Constitutional:      General: He is not in acute distress.    Appearance: He is well-developed. He is not diaphoretic.     Comments: Nontoxic appearing and in NAD  HENT:     Head: Normocephalic and atraumatic.  Eyes:     General: No scleral icterus.    Conjunctiva/sclera: Conjunctivae normal.  Neck:     Musculoskeletal: Normal range of motion.  Cardiovascular:     Rate and Rhythm: Normal rate and regular rhythm.     Pulses: Normal pulses.     Comments: Mild tachycardia Pulmonary:     Effort: Pulmonary effort is normal. No respiratory distress.     Breath sounds: No stridor. No wheezing, rhonchi or rales.     Comments: Lungs CTAB. Respirations even and unlabored. No cough noted. Musculoskeletal: Normal range of motion.  Skin:    General: Skin is warm and dry.     Coloration: Skin is not pale.     Findings: No erythema or rash.  Neurological:     General: No focal deficit present.     Mental Status: He is alert and oriented to person, place, and time.     Coordination: Coordination normal.     Comments: Ambulatory with steady gait.  Psychiatric:        Behavior: Behavior normal.      ED Treatments / Results  Labs (all labs ordered are listed, but only abnormal results are displayed) Labs Reviewed   COMPREHENSIVE METABOLIC PANEL - Abnormal; Notable for the following components:      Result Value   Glucose, Bld 120 (*)    Calcium 8.7 (*)    AST 46 (*)    Total Bilirubin 1.4 (*)    All other components within normal limits  CBC WITH DIFFERENTIAL/PLATELET - Abnormal; Notable for the following components:   WBC 24.3 (*)  RBC 2.76 (*)    Hemoglobin 9.1 (*)    HCT 27.7 (*)    MCV 100.4 (*)    RDW 19.1 (*)    nRBC 5.4 (*)    Neutro Abs 13.4 (*)    Lymphs Abs 7.1 (*)    Monocytes Absolute 2.6 (*)    Eosinophils Absolute 0.6 (*)    Abs Immature Granulocytes 0.57 (*)    All other components within normal limits  RETICULOCYTES - Abnormal; Notable for the following components:   Retic Ct Pct 13.0 (*)    RBC. 2.76 (*)    Retic Count, Absolute 359.6 (*)    Immature Retic Fract 46.9 (*)    All other components within normal limits    EKG None  Radiology No results found.  Procedures Procedures (including critical care time)  Medications Ordered in ED Medications  ketorolac (TORADOL) 30 MG/ML injection 30 mg (30 mg Intravenous Given 08/01/18 2311)     Initial Impression / Assessment and Plan / ED Course  I have reviewed the triage vital signs and the nursing notes.  Pertinent labs & imaging results that were available during my care of the patient were reviewed by me and considered in my medical decision making (see chart for details).     10:30 PM Patient with hx of sickle cell SS disease presenting for total body pain which he reports is similar to past sickle cell crisis. States he last took his 20mg  oxycodone 6 hours PTA. Has had a recent cough, but no CP, SOB, fevers. Will obtain labs, CXR. Orders placed for pain medications and IVF.  11:00 PM Approached by Cheryll Cockayne, RN about patient state. Reports he appears drowsy in the bed, sleeping, difficulty to arouse. Vitals currently stable. Will proceed with Toradol, but opt to hold Dilaudid at this time.  11:30  PM Patient placed on supplemental oxygen for hypoxia of 88% on room air, per RN. Placed on 2L supplemental O2.  12:15 AM Patient reassessed. He is sleeping and in NAD. Upon waking the patient, he appears drowsy. I inquired to the patient about his pain level and how he was feeling. Eyes appear glazed as patient lifts his hand to make a "thumbs up" sign. I subsequently asked the patient if he took any medication before coming to the ED given that he has been so sleepy. Patient mumbles slightly and references having Fentanyl, next saying "you know, the one that you can get in the patch".   Patient slowly starts to wake up a bit more. Now reporting no use of any narcotics prior to arrival. He is now stating that his pain is unchanged from arrival. Patient accusing staff of not doing anything to help his symptoms. I attempted to explain to the patient our hesitancy giving him high dose narcotics given abnormal drowsiness with hypoxia. I also reiterated to the patient that he had reported significant improvement to his symptoms only moments ago. Will continue to observe.  12:45 AM Notified by nurse that patient now requesting to leave [facility].  I went to see the patient who is increasingly agitated.  He is satting above 90% on room air at this time.  Continues to complain that his pain is no better.  Is not willing to remain in the department any longer for management.  I have explained to the patient that his low oxygen saturations are concerning.  He has been told that if these continue to decline, that this could result in worsening  clinical state or even death.  I have encouraged the patient to remain in the department for this reason.  He acknowledges risks of leaving, stating that he wishes to proceed with AMA discharge.   Final Clinical Impressions(s) / ED Diagnoses   Final diagnoses:  Sickle cell anemia with pain [facility])    ED Discharge Orders    None       Antonietta Breach,  PA-C 08/04/18 1224    Julianne Rice, MD 08/05/18 2127

## 2018-08-02 NOTE — ED Notes (Signed)
Pt left AMA without signing proper form

## 2018-08-02 NOTE — ED Notes (Addendum)
Pt yelling at staff saying "yall won't even tell me what the fuck is wrong with me". Explained to pt that he came in for sickle cell pain therefore there was nothing for [place] to tell him concerning his condition. Also explained to patient that up until the last 30-45 minutes he has been asleep and that he was first given Toradol for pain.

## 2018-08-02 NOTE — ED Notes (Signed)
Patient called this Probation officer into the room. Patient asked to be "removed from these machines so I can get the fuck out of here, y'all ain't done nothin' for me." Explained to patient that blood work was completed and that he was started on fluids and given toradol for pain due to his inability to stay awake and keep his oxygen saturation above a normal level. Patient became agitated and demanded blood work results and a Clinical cytogeneticist. Explained to patient that we do not provide cab vouchers. Patient states, "You just look like you don't even care." Explained to patient that we do care, but this writer was stating facts. Patient more agitated calling a friend on the phone. Patient yelling on phone at friend when this writer exited the room.

## 2018-08-02 NOTE — ED Notes (Signed)
When returning to unit, overheard provider explaining to pt that his O2 sat was dropping and that it would not be a safe decision to give him narcotic pain medications and that other alternatives may be necessary. Pt became increasingly agitated and accusing staff of not taking care of him.

## 2018-08-02 NOTE — ED Notes (Signed)
Patient somnolent, noted to desat to 86% room air. Placed on 2L [place] and made EDPA [facility] aware.

## 2018-08-02 NOTE — ED Notes (Signed)
Pt hitting call button multiple times yelling and demanding to speak to the nurse in charge and demanding a cab voucher because his ride left him. Pt then began yelling at staff as they walk by his room. Explained to pt that staff is very busy due to high pt census and he would have to wait a few more minutes. Pt demanding to leave AMA. Told pt the provider is on her way to speak with him.

## 2018-08-02 NOTE — ED Notes (Signed)
Provider explained to pt again that it was not safe to give him narcotics due to risk of his O2 sats dropping more. Pt had mentioned possibly receiving Fentanyl patch and taking his normal pain medications prior to arrival . Provider also explained the risk of possible accidental overdose if he has taken such medications before arriving at the hospital. Pt still insisted to leave AMA.

## 2018-08-08 ENCOUNTER — Telehealth: Payer: Self-pay

## 2018-08-08 DIAGNOSIS — K5903 Drug induced constipation: Secondary | ICD-10-CM

## 2018-08-08 DIAGNOSIS — Z79891 Long term (current) use of opiate analgesic: Secondary | ICD-10-CM

## 2018-08-08 DIAGNOSIS — T402X5A Adverse effect of other opioids, initial encounter: Secondary | ICD-10-CM

## 2018-08-08 DIAGNOSIS — D571 Sickle-cell disease without crisis: Secondary | ICD-10-CM

## 2018-08-10 MED ORDER — HYDROXYUREA 500 MG PO CAPS: 1500.0000 mg | ORAL_CAPSULE | Freq: Every day | ORAL | 3 refills | Status: DC

## 2018-08-10 MED ORDER — OXYCODONE HCL 20 MG PO TABS: 1.0000 | ORAL_TABLET | Freq: Four times a day (QID) | ORAL | 0 refills | Status: DC | PRN

## 2018-08-10 MED ORDER — NALOXEGOL OXALATE 25 MG PO TABS: 25.0000 mg | ORAL_TABLET | Freq: Every day | ORAL | 2 refills | Status: DC

## 2018-08-10 NOTE — Telephone Encounter (Signed)
refilled 

## 2018-08-11 ENCOUNTER — Ambulatory Visit: Payer: Medicare Other | Admitting: Family Medicine

## 2018-08-22 DIAGNOSIS — F209 Schizophrenia, unspecified: Secondary | ICD-10-CM | POA: Diagnosis not present

## 2018-08-23 ENCOUNTER — Telehealth: Payer: Self-pay

## 2018-08-23 DIAGNOSIS — Z79891 Long term (current) use of opiate analgesic: Secondary | ICD-10-CM

## 2018-08-23 DIAGNOSIS — D571 Sickle-cell disease without crisis: Secondary | ICD-10-CM

## 2018-08-25 MED ORDER — OXYCODONE HCL 20 MG PO TABS: 1.0000 | ORAL_TABLET | Freq: Four times a day (QID) | ORAL | 0 refills | Status: DC | PRN

## 2018-08-25 NOTE — Telephone Encounter (Signed)
Refilled

## 2018-08-30 DIAGNOSIS — F209 Schizophrenia, unspecified: Secondary | ICD-10-CM | POA: Diagnosis not present

## 2018-09-09 ENCOUNTER — Ambulatory Visit (INDEPENDENT_AMBULATORY_CARE_PROVIDER_SITE_OTHER): Payer: Medicare Other | Admitting: Family Medicine

## 2018-09-09 ENCOUNTER — Encounter: Payer: Self-pay | Admitting: Family Medicine

## 2018-09-09 ENCOUNTER — Other Ambulatory Visit: Payer: Self-pay

## 2018-09-09 VITALS — BP 128/68 | HR 79 | Temp 98.9°F | Resp 16 | Ht 68.0 in | Wt 173.2 lb

## 2018-09-09 DIAGNOSIS — D571 Sickle-cell disease without crisis: Secondary | ICD-10-CM

## 2018-09-09 DIAGNOSIS — Z79891 Long term (current) use of opiate analgesic: Secondary | ICD-10-CM

## 2018-09-09 DIAGNOSIS — M792 Neuralgia and neuritis, unspecified: Secondary | ICD-10-CM | POA: Diagnosis not present

## 2018-09-09 DIAGNOSIS — R809 Proteinuria, unspecified: Secondary | ICD-10-CM

## 2018-09-09 LAB — POCT URINALYSIS DIP (CLINITEK)
Bilirubin, UA: NEGATIVE
Glucose, UA: NEGATIVE mg/dL
Ketones, POC UA: NEGATIVE mg/dL
Leukocytes, UA: NEGATIVE
Nitrite, UA: NEGATIVE
POC PROTEIN,UA: 30 — AB
Spec Grav, UA: 1.01 (ref 1.010–1.025)
Urobilinogen, UA: 0.2 E.U./dL
pH, UA: 6 (ref 5.0–8.0)

## 2018-09-09 MED ORDER — IBUPROFEN 800 MG PO TABS: 800.0000 mg | ORAL_TABLET | Freq: Three times a day (TID) | ORAL | 0 refills | Status: DC | PRN

## 2018-09-09 MED ORDER — OXYCODONE HCL 20 MG PO TABS: 1.0000 | ORAL_TABLET | Freq: Four times a day (QID) | ORAL | 0 refills | Status: DC | PRN

## 2018-09-09 MED ORDER — L-GLUTAMINE ORAL POWDER: 10.0000 g | PACK | Freq: Two times a day (BID) | ORAL | 11 refills | Status: DC

## 2018-09-09 MED ORDER — GABAPENTIN 600 MG PO TABS: 600.0000 mg | ORAL_TABLET | Freq: Two times a day (BID) | ORAL | 0 refills | Status: DC

## 2018-09-09 NOTE — Patient Instructions (Signed)
Sickle Cell Anemia, Adult Sickle cell anemia is a condition where your red blood cells are shaped like sickles. Red blood cells carry oxygen through the body. Sickle-shaped cells do not live as long as normal red blood cells. They also clump together and block blood from flowing through the blood vessels. This prevents the body from getting enough oxygen. Sickle cell anemia causes organ damage and pain. It also increases the risk of infection. Follow these instructions at home: Medicines  Take over-the-counter and prescription medicines only as told by your doctor.  If you were prescribed an antibiotic medicine, take it as told by your doctor. Do not stop taking the antibiotic even if you start to feel better.  If you develop a fever, do not take medicines to lower the fever right away. Tell your doctor about the fever. Managing pain, stiffness, and swelling  Try these methods to help with pain: ? Use a heating pad. ? Take a warm bath. ? Distract yourself, such as by watching TV. Eating and drinking  Drink enough fluid to keep your pee (urine) clear or pale yellow. Drink more in hot weather and during exercise.  Limit or avoid alcohol.  Eat a healthy diet. Eat plenty of fruits, vegetables, whole grains, and lean protein.  Take vitamins and supplements as told by your doctor. Traveling  When traveling, keep these with you: ? Your medical information. ? The names of your doctors. ? Your medicines.  If you need to take an airplane, talk to your doctor first. Activity  Rest often.  Avoid exercises that make your heart beat much faster, such as jogging. General instructions  Do not use products that have nicotine or tobacco, such as cigarettes and e-cigarettes. If you need help quitting, ask your doctor.  Consider wearing a medical alert bracelet.  Avoid being in high places (high altitudes), such as mountains.  Avoid very hot or cold temperatures.  Avoid places where the  temperature changes a lot.  Keep all follow-up visits as told by your doctor. This is important. Contact a doctor if:  A joint hurts.  Your feet or hands hurt or swell.  You feel tired (fatigued). Get help right away if:  You have symptoms of infection. These include: ? Fever. ? Chills. ? Being very tired. ? Irritability. ? Poor eating. ? Throwing up (vomiting).  You feel dizzy or faint.  You have new stomach pain, especially on the left side.  You have a an erection (priapism) that lasts more than 4 hours.  You have numbness in your arms or legs.  You have a hard time moving your arms or legs.  You have trouble talking.  You have pain that does not go away when you take medicine.  You are short of breath.  You are breathing fast.  You have a long-term cough.  You have pain in your chest.  You have a bad headache.  You have a stiff neck.  Your stomach looks bloated even though you did not eat much.  Your skin is pale.  You suddenly cannot see well. Summary  Sickle cell anemia is a condition where your red blood cells are shaped like sickles.  Follow your doctor's advice on ways to manage pain, food to eat, activities to do, and steps to take for safe travel.  Get medical help right away if you have any signs of infection, such as a fever. This information is not intended to replace advice given to you by your  health care provider. Make sure you discuss any questions you have with your health care provider. Document Released: 03/29/2013 Document Revised: 07/14/2016 Document Reviewed: 07/14/2016 Elsevier Interactive Patient Education  2019 [place] such as bacteria, viruses, and parasites are found everywhere. They can be in the air and water. They can also be on surfaces like food, door handles, and your skin. Every day, your hands touch germs. Many of these germs can make you and your family sick. Washing your hands is one of the  best ways to lower your risk of getting and sharing germs. When should I wash my hands? You should wash your hands whenever you think they are dirty. You should also wash your hands:  Before: ? Visiting a baby or anyone with a weakened disease-fighting system (immunesystem). ? Putting in and taking out contact lenses.  After: ? Using the bathroom or helping someone else use the bathroom. ? Working or playing outside. ? Touching or taking out the garbage. ? Touching anything dirty around your home. ? Sneezing, coughing, or blowing your nose. ? Using a phone, including your mobile phone. ? Touching an animal, animal food, animal poop, or its toys or leash. ? Touching money. ? Using household cleaners or poisonous chemicals. ? Handling dirty clothes, bedding, or rags. ? Using public transportation. ? Going shopping, especially if you use a shopping cart or basket. ? Shaking hands. ? Handling livestock, such as cows or sheep.  Before and after: ? Preparing food. ? Eating. ? Visiting or taking care of someone who is sick. This includes touching used tissues, toys, and clothes. ? Changing a bandage (dressing). ? Taking care of an injury or wound. ? Giving or taking medicine. ? Preparing a bottle for a baby. ? Feeding a baby or young child. ? Changing a diaper. What is the right way to wash my hands?  1. Wet your hands with clean, running water. Turn off the water or move your hands out of the running water. 2. Apply liquid soap or bar soap to your hands. 3. Rub your hands together quickly to create lather. 4. Keep rubbing your hands together for at least 20 seconds. Thoroughly scrub all parts of your hands. This includes scrubbing under your fingernails and between your fingers. 5. Rinse your hands with clean, running water. Do this until all the soap is gone. 6. Dry your hands using an air dryer or a clean paper or cloth towel, or let your hands air-dry. Do not use your clothing  or a dirty towel to dry your hands. If you are in a public restroom, use your towel:  To turn off the water faucet.  To open the bathroom door. How can I clean my hands if I do not have soap and water?  If soap and clean water are not available, use a hand-washing wipe, spray, or gel (hand sanitizer). Use one that contains at least 60% alcohol. If you are handling food, gels are not recommended as a replacement for hand washing with soap and water. To use these products, follow the directions on the product, and:  Apply enough product to cover your hands.  Make sure you wipe, rub, or spray the product so that it reaches every part of your hands and wrists. Include the backs of your hands, between your fingers, and under your fingernails.  Rub the product onto your hands until it dries. Summary  Every day, your hands touch germs. Many of these germs can  make you and your family sick.  Washing your hands is one of the best ways to lower your risk of getting and sharing germs.  If soap and clean water are not available, use a hand-washing wipe, spray, or gel. This information is not intended to replace advice given to you by your health care provider. Make sure you discuss any questions you have with your health care provider. Document Released: 05/21/2008 Document Revised: 03/17/2017 Document Reviewed: 03/17/2017 Elsevier Interactive Patient Education  2019 [employer].

## 2018-09-09 NOTE — Addendum Note (Signed)
Addended by: Genelle Bal on: 09/09/2018 02:54 PM   Modules accepted: Orders

## 2018-09-09 NOTE — Progress Notes (Signed)
PATIENT CARE CENTER INTERNAL MEDICINE AND SICKLE CELL CARE  SICKLE CELL ANEMIA FOLLOW UP VISIT PROVIDER: Lanae Boast, FNP    Subjective:   Alejandro Soto  is a 33 y.o.  male who  has a past medical history of Pneumonia and Sickle cell anemia ([place]). presents for a follow up for Sickle Cell Anemia. The patient has had 0 admissions in the past 6 months.  He states that he has chronic constipation due to opioid use. He states that he drinks adequate amounts of water, eats considerably healthy and uses herbal supplements. Patient states that his renal function was altered in the past due to taking otc creatine supplement for weight gain.  Pain regimen includes: Ibuprofen and oxycodone  Hydrea Therapy: Yes Medication compliance: Yes  Pain today is 0/10.     Review of Systems  Constitutional: Negative.   HENT: Negative.   Eyes: Negative.   Respiratory: Negative.   Cardiovascular: Negative.   Gastrointestinal: Negative.   Genitourinary: Negative.   Musculoskeletal: Negative.   Skin: Negative.   Neurological: Negative.   Psychiatric/Behavioral: Negative.     Objective:   Objective  BP 128/68   Pulse 79   Temp 98.9 F (37.2 C)   Resp 16   Ht 5\' 8"  (1.727 m)   Wt 173 lb 3.2 oz (78.6 kg)   SpO2 98%   BMI 26.33 kg/m   Wt Readings from Last 3 Encounters:  09/09/18 173 lb 3.2 oz (78.6 kg)  08/01/18 178 lb (80.7 kg)  07/14/18 178 lb (80.7 kg)     Physical Exam Vitals signs and nursing note reviewed.  Constitutional:      General: He is not in acute distress.    Appearance: He is well-developed.  HENT:     Head: Normocephalic and atraumatic.  Eyes:     Conjunctiva/sclera: Conjunctivae normal.     Pupils: Pupils are equal, round, and reactive to light.  Neck:     Musculoskeletal: Normal range of motion.  Cardiovascular:     Rate and Rhythm: Normal rate and regular rhythm.     Pulses: Normal pulses.     Heart sounds: Murmur present.  Pulmonary:     Effort:  Pulmonary effort is normal. No respiratory distress.     Breath sounds: Normal breath sounds.  Abdominal:     General: Bowel sounds are normal. There is no distension.     Palpations: Abdomen is soft.  Musculoskeletal: Normal range of motion.  Skin:    General: Skin is warm and dry.  Neurological:     Mental Status: He is alert and oriented to person, place, and time. Mental status is at baseline.  Psychiatric:        Attention and Perception: Attention and perception normal.        Mood and Affect: Mood normal. Affect is flat.        Speech: Speech normal.        Behavior: Behavior normal. Behavior is cooperative.        Thought Content: Thought content normal.        Cognition and Memory: Cognition and memory normal.      Assessment/Plan:  1. Sickle cell disease, type SS ([facility]) - POCT URINALYSIS DIP (CLINITEK) - Oxycodone HCl 20 MG TABS; Take 1 tablet (20 mg total) by mouth every 6 (six) hours as needed for up to 15 days.  Dispense: 60 tablet; Refill: 0 - gabapentin (NEURONTIN) 600 MG tablet; Take 1 tablet (600 mg total) by  mouth 2 (two) times daily for 30 days.  Dispense: 60 tablet; Refill: 0 - ibuprofen (ADVIL,MOTRIN) 800 MG tablet; Take 1 tablet (800 mg total) by mouth every 8 (eight) hours as needed for mild pain.  Dispense: 90 tablet; Refill: 0 - L-glutamine (ENDARI) 5 g PACK Powder Packet; Take 10 g by mouth 2 (two) times daily.  Dispense: 60 packet; Refill: 11 - Microalbumin/Creatinine Ratio, Urine  2. Chronic prescription opiate use Medication refilled.  - Oxycodone HCl 20 MG TABS; Take 1 tablet (20 mg total) by mouth every 6 (six) hours as needed for up to 15 days.  Dispense: 60 tablet; Refill: 0 - gabapentin (NEURONTIN) 600 MG tablet; Take 1 tablet (600 mg total) by mouth 2 (two) times daily for 30 days.  Dispense: 60 tablet; Refill: 0  3. Neuropathic pain   Return to care as scheduled and prn. Patient verbalized understanding and agreed with plan of care.   1.  Sickle cell disease - Continue Hydrea  We discussed the need for good hydration, monitoring of hydration status, avoidance of heat, cold, stress, and infection triggers. We discussed the risks and benefits of Hydrea, including bone marrow suppression, the possibility of GI upset, skin ulcers, hair thinning, and teratogenicity. The patient was reminded of the need to seek medical attention of any symptoms of bleeding, anemia, or infection. Continue folic acid 1 mg daily to prevent aplastic bone marrow crises.   2. Pulmonary evaluation - Patient denies severe recurrent wheezes, shortness of breath with exercise, or persistent cough. If these symptoms develop, pulmonary function tests with spirometry will be ordered, and if abnormal, plan on referral to Pulmonology for further evaluation.  3. Cardiac - Routine screening for pulmonary hypertension is not recommended.  4. Eye - High risk of proliferative retinopathy. Annual eye exam with retinal exam recommended to patient.  5. Immunization status -  Yearly influenza vaccination is recommended, as well as being up to date with Meningococcal and Pneumococcal vaccines.   6. Acute and chronic painful episodes - We discussed that pt is to receive Schedule II prescriptions only from [place]. Pt is also aware that the prescription history is available to [place] online through the [facility] CSRS. Controlled substance agreement signed. We reminded Alejandro Soto that all patients receiving Schedule II narcotics must be seen for follow within one month of prescription being requested. We reviewed the terms of our pain agreement, including the need to keep medicines in a safe locked location away from children or pets, and the need to report excess sedation or constipation, measures to avoid constipation, and policies related to early refills and stolen prescriptions. According to the [place] Chronic Pain Initiative program, we have reviewed details related to analgesia, adverse effects,  aberrant behaviors.  7. Iron overload from chronic transfusion.  Not applicable at this time.  If this occurs will use Exjade for management.   8. Vitamin D deficiency - Drisdol 50,000 units weekly. Patient encouraged to take as prescribed.   The above recommendations are taken from the NIH Evidence-Based Management of Sickle Cell Disease: Expert Panel Report, 20149.   [name], FNP-BC Patient [facility] [address] [place], [place] [address] [phone]  This note has been created with Dragon speech recognition software and smart phrase technology. Any transcriptional errors are unintentional.

## 2018-09-10 LAB — MICROALBUMIN, URINE: Microalbumin, Urine: 173.5 ug/mL

## 2018-09-16 ENCOUNTER — Telehealth: Payer: Self-pay

## 2018-09-16 ENCOUNTER — Other Ambulatory Visit: Payer: Self-pay | Admitting: Family Medicine

## 2018-09-16 DIAGNOSIS — D571 Sickle-cell disease without crisis: Secondary | ICD-10-CM

## 2018-09-16 MED ORDER — L-GLUTAMINE ORAL POWDER: 10.0000 g | PACK | Freq: Two times a day (BID) | ORAL | 11 refills | Status: DC

## 2018-09-16 MED ORDER — L-GLUTAMINE ORAL POWDER: 10.0000 g | PACK | Freq: Two times a day (BID) | ORAL | 11 refills | Status: AC

## 2018-09-16 NOTE — Telephone Encounter (Signed)
Medication has to be sent to [facility]

## 2018-09-22 ENCOUNTER — Telehealth: Payer: Self-pay

## 2018-09-22 DIAGNOSIS — Z79891 Long term (current) use of opiate analgesic: Secondary | ICD-10-CM

## 2018-09-22 DIAGNOSIS — D571 Sickle-cell disease without crisis: Secondary | ICD-10-CM

## 2018-09-22 MED ORDER — OXYCODONE HCL 20 MG PO TABS: 1.0000 | ORAL_TABLET | Freq: Four times a day (QID) | ORAL | 0 refills | Status: DC | PRN

## 2018-09-22 NOTE — Telephone Encounter (Signed)
Reviewed [place] Substance Reporting system prior to prescribing opiate medications. No inconsistencies noted.   

## 2018-10-06 ENCOUNTER — Telehealth: Payer: Self-pay

## 2018-10-06 DIAGNOSIS — D571 Sickle-cell disease without crisis: Secondary | ICD-10-CM

## 2018-10-06 DIAGNOSIS — Z79891 Long term (current) use of opiate analgesic: Secondary | ICD-10-CM

## 2018-10-06 MED ORDER — OXYCODONE HCL 20 MG PO TABS: 1.0000 | ORAL_TABLET | Freq: Four times a day (QID) | ORAL | 0 refills | Status: DC | PRN

## 2018-10-06 MED ORDER — GABAPENTIN 600 MG PO TABS: 600.0000 mg | ORAL_TABLET | Freq: Two times a day (BID) | ORAL | 0 refills | Status: DC

## 2018-10-06 NOTE — Telephone Encounter (Signed)
Refilled

## 2018-10-10 DIAGNOSIS — F209 Schizophrenia, unspecified: Secondary | ICD-10-CM | POA: Diagnosis not present

## 2018-10-18 NOTE — Telephone Encounter (Signed)
Message sent to provider 

## 2018-10-19 ENCOUNTER — Telehealth: Payer: Self-pay

## 2018-10-19 DIAGNOSIS — Z79891 Long term (current) use of opiate analgesic: Secondary | ICD-10-CM

## 2018-10-19 DIAGNOSIS — D571 Sickle-cell disease without crisis: Secondary | ICD-10-CM

## 2018-10-19 NOTE — Telephone Encounter (Signed)
Message sent to provider 

## 2018-10-20 NOTE — Telephone Encounter (Signed)
Message sent to provider 

## 2018-10-21 MED ORDER — OXYCODONE HCL 20 MG PO TABS: 1.0000 | ORAL_TABLET | Freq: Four times a day (QID) | ORAL | 0 refills | Status: DC | PRN

## 2018-10-21 NOTE — Telephone Encounter (Signed)
Message sent to provider 

## 2018-10-21 NOTE — Telephone Encounter (Signed)
sent 

## 2018-10-27 NOTE — Telephone Encounter (Signed)
Message sent to provider 

## 2018-11-01 ENCOUNTER — Telehealth: Payer: Self-pay

## 2018-11-01 DIAGNOSIS — Z79891 Long term (current) use of opiate analgesic: Secondary | ICD-10-CM

## 2018-11-01 DIAGNOSIS — D571 Sickle-cell disease without crisis: Secondary | ICD-10-CM

## 2018-11-02 ENCOUNTER — Other Ambulatory Visit: Payer: Self-pay | Admitting: Family Medicine

## 2018-11-02 DIAGNOSIS — D571 Sickle-cell disease without crisis: Secondary | ICD-10-CM

## 2018-11-02 DIAGNOSIS — Z79891 Long term (current) use of opiate analgesic: Secondary | ICD-10-CM

## 2018-11-02 NOTE — Telephone Encounter (Signed)
Message sent to provider 

## 2018-11-04 MED ORDER — OXYCODONE HCL 20 MG PO TABS: 1.0000 | ORAL_TABLET | Freq: Four times a day (QID) | ORAL | 0 refills | Status: DC | PRN

## 2018-11-04 NOTE — Addendum Note (Signed)
Addended by: Genelle Bal on: 11/04/2018 10:47 AM   Modules accepted: Orders

## 2018-11-15 ENCOUNTER — Telehealth: Payer: Self-pay

## 2018-11-15 DIAGNOSIS — D571 Sickle-cell disease without crisis: Secondary | ICD-10-CM

## 2018-11-15 DIAGNOSIS — Z79891 Long term (current) use of opiate analgesic: Secondary | ICD-10-CM

## 2018-11-15 MED ORDER — OXYCODONE HCL 20 MG PO TABS: 1.0000 | ORAL_TABLET | Freq: Four times a day (QID) | ORAL | 0 refills | Status: DC | PRN

## 2018-11-15 NOTE — Telephone Encounter (Signed)
refilled 

## 2018-11-30 ENCOUNTER — Telehealth: Payer: Self-pay

## 2018-11-30 DIAGNOSIS — D571 Sickle-cell disease without crisis: Secondary | ICD-10-CM

## 2018-11-30 DIAGNOSIS — Z79891 Long term (current) use of opiate analgesic: Secondary | ICD-10-CM

## 2018-12-01 ENCOUNTER — Other Ambulatory Visit: Payer: Self-pay | Admitting: Family Medicine

## 2018-12-01 DIAGNOSIS — Z79891 Long term (current) use of opiate analgesic: Secondary | ICD-10-CM

## 2018-12-01 DIAGNOSIS — D571 Sickle-cell disease without crisis: Secondary | ICD-10-CM

## 2018-12-02 MED ORDER — OXYCODONE HCL 20 MG PO TABS: 1.0000 | ORAL_TABLET | Freq: Four times a day (QID) | ORAL | 0 refills | Status: DC | PRN

## 2018-12-02 NOTE — Telephone Encounter (Signed)
refilled 

## 2018-12-12 ENCOUNTER — Ambulatory Visit: Payer: Medicare Other | Admitting: Family Medicine

## 2018-12-15 ENCOUNTER — Telehealth: Payer: Self-pay

## 2018-12-15 DIAGNOSIS — D571 Sickle-cell disease without crisis: Secondary | ICD-10-CM

## 2018-12-15 DIAGNOSIS — Z79891 Long term (current) use of opiate analgesic: Secondary | ICD-10-CM

## 2018-12-15 MED ORDER — OXYCODONE HCL 20 MG PO TABS: 1.0000 | ORAL_TABLET | Freq: Four times a day (QID) | ORAL | 0 refills | Status: DC | PRN

## 2018-12-15 NOTE — Telephone Encounter (Signed)
refilled 

## 2018-12-16 ENCOUNTER — Telehealth: Payer: Self-pay

## 2018-12-16 NOTE — Telephone Encounter (Signed)
Called to do COVID Screening for appointment tomorrow. No answer. Left a message to call back. Thanks! 

## 2018-12-19 ENCOUNTER — Ambulatory Visit (INDEPENDENT_AMBULATORY_CARE_PROVIDER_SITE_OTHER): Payer: Medicare Other | Admitting: Family Medicine

## 2018-12-19 ENCOUNTER — Other Ambulatory Visit: Payer: Self-pay

## 2018-12-19 ENCOUNTER — Encounter: Payer: Self-pay | Admitting: Family Medicine

## 2018-12-19 VITALS — BP 128/78 | HR 81 | Temp 98.4°F | Resp 14 | Wt 192.0 lb

## 2018-12-19 DIAGNOSIS — D571 Sickle-cell disease without crisis: Secondary | ICD-10-CM | POA: Diagnosis not present

## 2018-12-19 LAB — POCT URINALYSIS DIPSTICK
Bilirubin, UA: NEGATIVE
Blood, UA: NEGATIVE
Glucose, UA: NEGATIVE
Ketones, UA: NEGATIVE
Leukocytes, UA: NEGATIVE
Nitrite, UA: NEGATIVE
Protein, UA: NEGATIVE
Spec Grav, UA: 1.02 (ref 1.010–1.025)
Urobilinogen, UA: 1 E.U./dL
pH, UA: 6.5 (ref 5.0–8.0)

## 2018-12-19 NOTE — Patient Instructions (Signed)
Sickle Cell Anemia, Adult ° °Sickle cell anemia is a condition where your red blood cells are shaped like sickles. Red blood cells carry oxygen through the body. Sickle-shaped cells do not live as long as normal red blood cells. They also clump together and block blood from flowing through the blood vessels. This prevents the body from getting enough oxygen. Sickle cell anemia causes organ damage and pain. It also increases the risk of infection. °Follow these instructions at home: °Medicines °· Take over-the-counter and prescription medicines only as told by your doctor. °· If you were prescribed an antibiotic medicine, take it as told by your doctor. Do not stop taking the antibiotic even if you start to feel better. °· If you develop a fever, do not take medicines to lower the fever right away. Tell your doctor about the fever. °Managing pain, stiffness, and swelling °· Try these methods to help with pain: °? Use a heating pad. °? Take a warm bath. °? Distract yourself, such as by watching TV. °Eating and drinking °· Drink enough fluid to keep your pee (urine) clear or pale yellow. Drink more in hot weather and during exercise. °· Limit or avoid alcohol. °· Eat a healthy diet. Eat plenty of fruits, vegetables, whole grains, and lean protein. °· Take vitamins and supplements as told by your doctor. °Traveling °· When traveling, keep these with you: °? Your medical information. °? The names of your doctors. °? Your medicines. °· If you need to take an airplane, talk to your doctor first. °Activity °· Rest often. °· Avoid exercises that make your heart beat much faster, such as jogging. °General instructions °· Do not use products that have nicotine or tobacco, such as cigarettes and e-cigarettes. If you need help quitting, ask your doctor. °· Consider wearing a medical alert bracelet. °· Avoid being in high places (high altitudes), such as mountains. °· Avoid very hot or cold temperatures. °· Avoid places where the  temperature changes a lot. °· Keep all follow-up visits as told by your doctor. This is important. °Contact a doctor if: °· A joint hurts. °· Your feet or hands hurt or swell. °· You feel tired (fatigued). °Get help right away if: °· You have symptoms of infection. These include: °? Fever. °? Chills. °? Being very tired. °? Irritability. °? Poor eating. °? Throwing up (vomiting). °· You feel dizzy or faint. °· You have new stomach pain, especially on the left side. °· You have a an erection (priapism) that lasts more than 4 hours. °· You have numbness in your arms or legs. °· You have a hard time moving your arms or legs. °· You have trouble talking. °· You have pain that does not go away when you take medicine. °· You are short of breath. °· You are breathing fast. °· You have a long-term cough. °· You have pain in your chest. °· You have a bad headache. °· You have a stiff neck. °· Your stomach looks bloated even though you did not eat much. °· Your skin is pale. °· You suddenly cannot see well. °Summary °· Sickle cell anemia is a condition where your red blood cells are shaped like sickles. °· Follow your doctor's advice on ways to manage pain, food to eat, activities to do, and steps to take for safe travel. °· Get medical help right away if you have any signs of infection, such as a fever. °This information is not intended to replace advice given to you by   your health care provider. Make sure you discuss any questions you have with your health care provider. °Document Released: 03/29/2013 Document Revised: 09/30/2018 Document Reviewed: 07/14/2016 °Elsevier Patient Education © 2020 [employer] ° °

## 2018-12-19 NOTE — Progress Notes (Signed)
PATIENT CARE CENTER INTERNAL MEDICINE AND SICKLE CELL CARE  SICKLE CELL ANEMIA FOLLOW UP VISIT PROVIDER: Lanae Boast, FNP    Subjective:   Alejandro Soto  is a 33 y.o.  male who  has a past medical history of Pneumonia and Sickle cell anemia ([place]). presents for a follow up for Sickle Cell Anemia. The patient has had 0 admissions in the past 6 months.  Pain regimen includes: Ibuprofen and oxycodone Hydrea Therapy: Yes Medication compliance: Yes He is not currently in pain and reports adequate hydration. He states that his home pain medication is working well and would like to continue.    ROS  Objective:   Objective  BP 128/78 (BP Location: Left Arm, Patient Position: Sitting, Cuff Size: Normal)   Pulse 81   Temp 98.4 F (36.9 C) (Oral)   Resp 14   Wt 192 lb (87.1 kg)   SpO2 98%   BMI 29.19 kg/m   Wt Readings from Last 3 Encounters:  12/19/18 192 lb (87.1 kg)  09/09/18 173 lb 3.2 oz (78.6 kg)  08/01/18 178 lb (80.7 kg)     Physical Exam   Assessment/Plan:   Assessment   Encounter Diagnosis  Name Primary?  . Sickle cell disease, type SS ([place]) Yes     Plan  1. Sickle cell disease, type SS ([place]) No medication changes warranted at the present time.   - Urinalysis Dipstick - CBC with Differential - Comprehensive metabolic panel   Return to care as scheduled and prn. Patient verbalized understanding and agreed with plan of care.   1. Sickle cell disease - Continue Hydrea   We discussed the need for good hydration, monitoring of hydration status, avoidance of heat, cold, stress, and infection triggers. We discussed the risks and benefits of Hydrea, including bone marrow suppression, the possibility of GI upset, skin ulcers, hair thinning, and teratogenicity. The patient was reminded of the need to seek medical attention of any symptoms of bleeding, anemia, or infection. Continue folic acid 1 mg daily to prevent aplastic bone marrow crises.   2. Pulmonary  evaluation - Patient denies severe recurrent wheezes, shortness of breath with exercise, or persistent cough. If these symptoms develop, pulmonary function tests with spirometry will be ordered, and if abnormal, plan on referral to Pulmonology for further evaluation.  3. Cardiac - Routine screening for pulmonary hypertension is not recommended.  4. Eye - High risk of proliferative retinopathy. Annual eye exam with retinal exam recommended to patient.  5. Immunization status -  Yearly influenza vaccination is recommended, as well as being up to date with Meningococcal and Pneumococcal vaccines.   6. Acute and chronic painful episodes - We discussed that pt is to receive Schedule II prescriptions only from [place]. Pt is also aware that the prescription history is available to [place] online through the [facility] CSRS. Controlled substance agreement signed. We reminded [place] Law that all patients receiving Schedule II narcotics must be seen for follow within one month of prescription being requested. We reviewed the terms of our pain agreement, including the need to keep medicines in a safe locked location away from children or pets, and the need to report excess sedation or constipation, measures to avoid constipation, and policies related to early refills and stolen prescriptions. According to the [place] Chronic Pain Initiative program, we have reviewed details related to analgesia, adverse effects, aberrant behaviors.  7. Iron overload from chronic transfusion.  Not applicable at this time.  If this occurs will use Exjade  for management.   8. Vitamin D deficiency - Drisdol 50,000 units weekly. Patient encouraged to take as prescribed.   The above recommendations are taken from the NIH Evidence-Based Management of Sickle Cell Disease: Expert Panel Report, 20149.   [name], FNP-BC Patient [facility] [address] [place], [place] [address] [phone]  This note has  been created with Dragon speech recognition software and smart phrase technology. Any transcriptional errors are unintentional.

## 2018-12-20 LAB — CBC WITH DIFFERENTIAL/PLATELET
Basophils Absolute: 0.1 10*3/uL (ref 0.0–0.2)
Basos: 0 %
EOS (ABSOLUTE): 0.5 10*3/uL — ABNORMAL HIGH (ref 0.0–0.4)
Eos: 2 %
Hematocrit: 32.2 % — ABNORMAL LOW (ref 37.5–51.0)
Hemoglobin: 11 g/dL — ABNORMAL LOW (ref 13.0–17.7)
Immature Grans (Abs): 0.2 10*3/uL — ABNORMAL HIGH (ref 0.0–0.1)
Immature Granulocytes: 1 %
Lymphocytes Absolute: 5.7 10*3/uL — ABNORMAL HIGH (ref 0.7–3.1)
Lymphs: 29 %
MCH: 32.9 pg (ref 26.6–33.0)
MCHC: 34.2 g/dL (ref 31.5–35.7)
MCV: 96 fL (ref 79–97)
Monocytes Absolute: 1.3 10*3/uL — ABNORMAL HIGH (ref 0.1–0.9)
Monocytes: 6 %
NRBC: 4 % — ABNORMAL HIGH (ref 0–0)
Neutrophils Absolute: 12.1 10*3/uL — ABNORMAL HIGH (ref 1.4–7.0)
Neutrophils: 62 %
Platelets: 610 10*3/uL — ABNORMAL HIGH (ref 150–450)
RBC: 3.34 x10E6/uL — ABNORMAL LOW (ref 4.14–5.80)
RDW: 16.8 % — ABNORMAL HIGH (ref 11.6–15.4)
WBC: 19.8 10*3/uL — ABNORMAL HIGH (ref 3.4–10.8)

## 2018-12-20 LAB — COMPREHENSIVE METABOLIC PANEL
ALT: 18 IU/L (ref 0–44)
AST: 28 IU/L (ref 0–40)
Albumin/Globulin Ratio: 1.7 (ref 1.2–2.2)
Albumin: 4.5 g/dL (ref 4.0–5.0)
Alkaline Phosphatase: 62 IU/L (ref 39–117)
BUN/Creatinine Ratio: 14 (ref 9–20)
BUN: 11 mg/dL (ref 6–20)
Bilirubin Total: 1.1 mg/dL (ref 0.0–1.2)
CO2: 23 mmol/L (ref 20–29)
Calcium: 9.4 mg/dL (ref 8.7–10.2)
Chloride: 104 mmol/L (ref 96–106)
Creatinine, Ser: 0.79 mg/dL (ref 0.76–1.27)
GFR calc Af Amer: 137 mL/min/{1.73_m2} (ref >59)
GFR calc non Af Amer: 119 mL/min/{1.73_m2} (ref >59)
Globulin, Total: 2.6 g/dL (ref 1.5–4.5)
Glucose: 82 mg/dL (ref 65–99)
Potassium: 4.8 mmol/L (ref 3.5–5.2)
Sodium: 140 mmol/L (ref 134–144)
Total Protein: 7.1 g/dL (ref 6.0–8.5)

## 2018-12-21 NOTE — Progress Notes (Signed)
The  labs are stable without significant clinical change.  All other results are normal or within acceptable limits. No Medication changes   

## 2018-12-22 ENCOUNTER — Telehealth: Payer: Self-pay

## 2018-12-22 NOTE — Telephone Encounter (Signed)
-----   Message from Lanae Boast, [place] sent at 12/21/2018  4:51 PM EDT ----- The  labs are stable without significant clinical change.  All other results are normal or within acceptable limits. No Medication changes

## 2018-12-22 NOTE — Telephone Encounter (Signed)
Called and spoke with patient advised that labs are stable and no changes to be made at this time. Thanks!

## 2018-12-28 ENCOUNTER — Telehealth: Payer: Self-pay

## 2018-12-28 DIAGNOSIS — D571 Sickle-cell disease without crisis: Secondary | ICD-10-CM

## 2018-12-28 DIAGNOSIS — Z79891 Long term (current) use of opiate analgesic: Secondary | ICD-10-CM

## 2018-12-30 ENCOUNTER — Telehealth: Payer: Self-pay | Admitting: Family Medicine

## 2018-12-30 DIAGNOSIS — D571 Sickle-cell disease without crisis: Secondary | ICD-10-CM

## 2018-12-30 DIAGNOSIS — Z79891 Long term (current) use of opiate analgesic: Secondary | ICD-10-CM

## 2018-12-30 MED ORDER — GABAPENTIN 600 MG PO TABS: ORAL_TABLET | ORAL | 0 refills | Status: DC

## 2018-12-30 MED ORDER — OXYCODONE HCL 20 MG PO TABS: 1.0000 | ORAL_TABLET | Freq: Four times a day (QID) | ORAL | 0 refills | Status: DC | PRN

## 2018-12-30 NOTE — Telephone Encounter (Signed)
Reviewed [place] Substance Reporting system prior to prescribing opiate medications. No inconsistencies noted.   

## 2018-12-30 NOTE — Telephone Encounter (Signed)
Refill request for oxycodone.  

## 2019-01-10 ENCOUNTER — Telehealth: Payer: Self-pay

## 2019-01-11 ENCOUNTER — Other Ambulatory Visit: Payer: Self-pay | Admitting: Internal Medicine

## 2019-01-13 ENCOUNTER — Telehealth: Payer: Self-pay | Admitting: Family Medicine

## 2019-01-13 ENCOUNTER — Other Ambulatory Visit: Payer: Self-pay | Admitting: Internal Medicine

## 2019-01-13 DIAGNOSIS — Z79891 Long term (current) use of opiate analgesic: Secondary | ICD-10-CM

## 2019-01-13 DIAGNOSIS — D571 Sickle-cell disease without crisis: Secondary | ICD-10-CM

## 2019-01-13 MED ORDER — OXYCODONE HCL 20 MG PO TABS: 1.0000 | ORAL_TABLET | Freq: Four times a day (QID) | ORAL | 0 refills | Status: DC | PRN

## 2019-01-13 NOTE — Telephone Encounter (Signed)
Called and informed patient that it has been sent into pharmacy. Thanks!

## 2019-01-13 NOTE — Telephone Encounter (Signed)
Refilled

## 2019-01-16 DIAGNOSIS — H04123 Dry eye syndrome of bilateral lacrimal glands: Secondary | ICD-10-CM | POA: Diagnosis not present

## 2019-01-16 DIAGNOSIS — H53023 Refractive amblyopia, bilateral: Secondary | ICD-10-CM | POA: Diagnosis not present

## 2019-01-16 DIAGNOSIS — D573 Sickle-cell trait: Secondary | ICD-10-CM | POA: Diagnosis not present

## 2019-01-16 DIAGNOSIS — H5213 Myopia, bilateral: Secondary | ICD-10-CM | POA: Diagnosis not present

## 2019-01-20 ENCOUNTER — Encounter (HOSPITAL_COMMUNITY): Payer: Self-pay

## 2019-01-25 ENCOUNTER — Telehealth: Payer: Self-pay

## 2019-01-25 ENCOUNTER — Other Ambulatory Visit: Payer: Self-pay | Admitting: Family Medicine

## 2019-01-25 DIAGNOSIS — Z79891 Long term (current) use of opiate analgesic: Secondary | ICD-10-CM

## 2019-01-25 DIAGNOSIS — D571 Sickle-cell disease without crisis: Secondary | ICD-10-CM

## 2019-01-25 MED ORDER — OXYCODONE HCL 20 MG PO TABS: 1.0000 | ORAL_TABLET | Freq: Four times a day (QID) | ORAL | 0 refills | Status: DC | PRN

## 2019-01-25 NOTE — Progress Notes (Signed)
Reviewed [place] Substance Reporting system prior to prescribing opiate medications. No inconsistencies noted.   

## 2019-02-07 ENCOUNTER — Telehealth: Payer: Self-pay

## 2019-02-07 DIAGNOSIS — D571 Sickle-cell disease without crisis: Secondary | ICD-10-CM

## 2019-02-07 MED ORDER — HYDROXYUREA 500 MG PO CAPS: 1500.0000 mg | ORAL_CAPSULE | Freq: Every day | ORAL | 3 refills | Status: DC

## 2019-02-07 NOTE — Telephone Encounter (Signed)
Refill for hydrea sent to pharmacy. Thanks!

## 2019-02-09 ENCOUNTER — Other Ambulatory Visit: Payer: Self-pay | Admitting: Family Medicine

## 2019-02-09 ENCOUNTER — Telehealth: Payer: Self-pay

## 2019-02-09 DIAGNOSIS — D571 Sickle-cell disease without crisis: Secondary | ICD-10-CM

## 2019-02-09 DIAGNOSIS — Z79891 Long term (current) use of opiate analgesic: Secondary | ICD-10-CM

## 2019-02-09 MED ORDER — OXYCODONE HCL 20 MG PO TABS: 1.0000 | ORAL_TABLET | Freq: Four times a day (QID) | ORAL | 0 refills | Status: DC | PRN

## 2019-02-09 NOTE — Progress Notes (Signed)
completed

## 2019-02-21 ENCOUNTER — Telehealth: Payer: Self-pay

## 2019-02-21 DIAGNOSIS — D571 Sickle-cell disease without crisis: Secondary | ICD-10-CM

## 2019-02-21 DIAGNOSIS — Z79891 Long term (current) use of opiate analgesic: Secondary | ICD-10-CM

## 2019-02-21 MED ORDER — GABAPENTIN 600 MG PO TABS: ORAL_TABLET | ORAL | 0 refills | Status: DC

## 2019-02-21 NOTE — Telephone Encounter (Signed)
Refill sent into pharmacy. Thanks!  

## 2019-02-22 ENCOUNTER — Telehealth: Payer: Self-pay

## 2019-02-22 ENCOUNTER — Other Ambulatory Visit: Payer: Self-pay | Admitting: Family Medicine

## 2019-02-22 DIAGNOSIS — Z79891 Long term (current) use of opiate analgesic: Secondary | ICD-10-CM

## 2019-02-22 DIAGNOSIS — D571 Sickle-cell disease without crisis: Secondary | ICD-10-CM

## 2019-02-22 MED ORDER — OXYCODONE HCL 20 MG PO TABS: 1.0000 | ORAL_TABLET | Freq: Four times a day (QID) | ORAL | 0 refills | Status: DC | PRN

## 2019-02-22 NOTE — Progress Notes (Signed)
refilled 

## 2019-03-01 ENCOUNTER — Encounter (HOSPITAL_COMMUNITY): Payer: Self-pay

## 2019-03-01 ENCOUNTER — Encounter (HOSPITAL_COMMUNITY): Payer: Self-pay | Admitting: *Deleted

## 2019-03-09 ENCOUNTER — Telehealth: Payer: Self-pay | Admitting: Family Medicine

## 2019-03-09 DIAGNOSIS — Z79891 Long term (current) use of opiate analgesic: Secondary | ICD-10-CM

## 2019-03-09 DIAGNOSIS — D571 Sickle-cell disease without crisis: Secondary | ICD-10-CM

## 2019-03-09 MED ORDER — OXYCODONE HCL 20 MG PO TABS: 1.0000 | ORAL_TABLET | Freq: Four times a day (QID) | ORAL | 0 refills | Status: DC | PRN

## 2019-03-09 NOTE — Telephone Encounter (Signed)
Refill request for oxycodone.  

## 2019-03-22 ENCOUNTER — Ambulatory Visit (INDEPENDENT_AMBULATORY_CARE_PROVIDER_SITE_OTHER): Payer: Medicare Other | Admitting: Family Medicine

## 2019-03-22 ENCOUNTER — Other Ambulatory Visit: Payer: Self-pay

## 2019-03-22 ENCOUNTER — Ambulatory Visit: Payer: Medicare Other | Admitting: Family Medicine

## 2019-03-22 ENCOUNTER — Encounter: Payer: Self-pay | Admitting: Family Medicine

## 2019-03-22 VITALS — BP 116/77 | HR 70 | Temp 99.4°F | Ht 68.0 in | Wt 188.2 lb

## 2019-03-22 DIAGNOSIS — G4700 Insomnia, unspecified: Secondary | ICD-10-CM | POA: Diagnosis not present

## 2019-03-22 DIAGNOSIS — Z79891 Long term (current) use of opiate analgesic: Secondary | ICD-10-CM | POA: Diagnosis not present

## 2019-03-22 DIAGNOSIS — K5903 Drug induced constipation: Secondary | ICD-10-CM

## 2019-03-22 DIAGNOSIS — T402X5A Adverse effect of other opioids, initial encounter: Secondary | ICD-10-CM | POA: Diagnosis not present

## 2019-03-22 DIAGNOSIS — Z09 Encounter for follow-up examination after completed treatment for conditions other than malignant neoplasm: Secondary | ICD-10-CM

## 2019-03-22 DIAGNOSIS — R8290 Unspecified abnormal findings in urine: Secondary | ICD-10-CM

## 2019-03-22 DIAGNOSIS — D571 Sickle-cell disease without crisis: Secondary | ICD-10-CM | POA: Diagnosis not present

## 2019-03-22 LAB — POCT URINALYSIS DIPSTICK
Blood, UA: NEGATIVE
Glucose, UA: NEGATIVE
Leukocytes, UA: NEGATIVE
Nitrite, UA: NEGATIVE
Protein, UA: POSITIVE — AB
Spec Grav, UA: 1.025 (ref 1.010–1.025)
Urobilinogen, UA: 1 E.U./dL
pH, UA: 5.5 (ref 5.0–8.0)

## 2019-03-22 MED ORDER — TRAZODONE HCL 100 MG PO TABS: 100.0000 mg | ORAL_TABLET | Freq: Every day | ORAL | 3 refills | Status: DC

## 2019-03-22 MED ORDER — GABAPENTIN 600 MG PO TABS: ORAL_TABLET | ORAL | 3 refills | Status: DC

## 2019-03-22 NOTE — Progress Notes (Signed)
Patient [facility]   Established Patient Office Visit  Subjective:  Patient ID: Alejandro Soto, male    DOB: 03-Jan-1986  Age: 33 y.o. MRN: [id number]  CC:  Chief Complaint  Patient presents with  . Follow-up    3 month follow up , pt has no other  concerns     HPI Alejandro Soto is a 33 year old male who presents for Follow Up today.   Past Medical History:  Diagnosis Date  . Pneumonia   . Sickle cell anemia (HCC)    Current Status: This will be Alejandro Soto's initial office visit with me. He was previously seeing Lanae Boast, NP for his PCP needs. Since his last office visit, he is doing well with no complaints. He states that he has pain in his back and abdomen. He rates his pain today at 4/10. He has not had a hospital visit for Sickle Cell Crisis since 01/15/2018 where he was treated and discharged the same day. He is currently taking all medications as prescribed and staying well hydrated. He reports occasional nausea, constipation, dizziness and headaches. He denies fevers, chills, fatigue, recent infections, weight loss, and night sweats. He has not had any headaches, visual changes, dizziness, and falls. No chest pain, heart palpitations, cough and shortness of breath reported. No reports of GI problems such as nausea, vomiting, diarrhea, and constipation. He has no reports of blood in stools, dysuria and hematuria. No depression or anxiety reported.    Past Surgical History:  Procedure Laterality Date  . NO PAST SURGERIES      Family History  Problem Relation Age of Onset  . Cancer Maternal Grandmother        colon   . Diabetes Father   . Cancer Father 72       Prostate  . Asthma Brother     Social History   Socioeconomic History  . Marital status: Single    Spouse name: Not on file  . Number of children: Not on file  . Years of education: Not on file  . Highest education level: Not on file  Occupational History  . Not on  file  Social Needs  . Financial resource strain: Not on file  . Food insecurity    Worry: Not on file    Inability: Not on file  . Transportation needs    Medical: Not on file    Non-medical: Not on file  Tobacco Use  . Smoking status: Current Some Day Smoker    Packs/day: 0.25    Years: 5.00    Pack years: 1.25    Types: Cigarettes  . Smokeless tobacco: Never Used  Substance and Sexual Activity  . Alcohol use: No    Alcohol/week: 0.0 standard drinks  . Drug use: No  . Sexual activity: Yes  Lifestyle  . Physical activity    Days per week: Not on file    Minutes per session: Not on file  . Stress: Not on file  Relationships  . Social Herbalist on phone: Not on file    Gets together: Not on file    Attends religious service: Not on file    Active member of club or organization: Not on file    Attends meetings of clubs or organizations: Not on file    Relationship status: Not on file  . Intimate partner violence    Fear of current or ex partner: Not on file  Emotionally abused: Not on file    Physically abused: Not on file    Forced sexual activity: Not on file  Other Topics Concern  . Not on file  Social History Narrative  . Not on file    Outpatient Medications Prior to Visit  Medication Sig Dispense Refill  . folic acid (FOLVITE) 1 MG tablet Take 1 tablet (1 mg total) by mouth daily. 30 tablet 11  . hydroxyurea (HYDREA) 500 MG capsule Take 3 capsules (1,500 mg total) by mouth daily. May take with food to minimize GI side effects. 270 capsule 3  . ibuprofen (ADVIL,MOTRIN) 800 MG tablet Take 1 tablet (800 mg total) by mouth every 8 (eight) hours as needed for mild pain. 90 tablet 0  . Multiple Vitamin (MULTIVITAMIN) tablet Take 2 tablets by mouth daily.     . naloxegol oxalate (MOVANTIK) 25 MG TABS tablet Take 1 tablet (25 mg total) by mouth daily. 30 tablet 2  . Oxycodone HCl 20 MG TABS Take 1 tablet (20 mg total) by mouth every 6 (six) hours as needed  for up to 15 days. 60 tablet 0  . REXULTI 1 MG TABS     . triamcinolone (KENALOG) 0.025 % ointment Apply 1 application topically 2 (two) times daily. 30 g 2  . gabapentin (NEURONTIN) 600 MG tablet TAKE 1 TABLET(600 MG) BY MOUTH TWICE DAILY 60 tablet 0  . busPIRone (BUSPAR) 5 MG tablet Take 1 tablet (5 mg total) by mouth 2 (two) times daily. (Patient not taking: Reported on 01/20/2018) 30 tablet 1  . traZODone (DESYREL) 100 MG tablet      No facility-administered medications prior to visit.     Allergies  Allergen Reactions  . Amoxicillin Diarrhea    Did it involve swelling of the face/tongue/throat, SOB, or low BP? No Did it involve sudden or severe rash/hives, skin peeling, or any reaction on the inside of your mouth or nose? No Did you need to seek medical attention at a hospital or doctor's office? No When did it last happen?childhood If all above answers are "NO", may proceed with cephalosporin use.     ROS Review of Systems  Constitutional: Negative.   HENT: Negative.   Eyes: Negative.   Respiratory: Negative.   Cardiovascular: Negative.   Gastrointestinal: Negative.   Endocrine: Negative.   Genitourinary: Negative.   Musculoskeletal: Negative.   Skin: Negative.   Allergic/Immunologic: Negative.   Neurological: Positive for dizziness (occasional ) and headaches (occasional).  Hematological: Negative.   Psychiatric/Behavioral: Negative.       Objective:    Physical Exam  Constitutional: He is oriented to person, place, and time. He appears well-developed and well-nourished.  HENT:  Head: Normocephalic and atraumatic.  Eyes: Conjunctivae are normal.  Neck: Normal range of motion. Neck supple.  Cardiovascular: Normal rate, regular rhythm, normal heart sounds and intact distal pulses.  Pulmonary/Chest: Effort normal and breath sounds normal.  Abdominal: Soft. Bowel sounds are normal.  Musculoskeletal: Normal range of motion.  Neurological: He is oriented to  person, place, and time. He has normal reflexes.  Skin: Skin is warm and dry.  Psychiatric: He has a normal mood and affect. Judgment and thought content normal.  Nursing note and vitals reviewed.   BP 116/77 (BP Location: Right Arm, Patient Position: Sitting, Cuff Size: Normal)   Pulse 70   Temp 99.4 F (37.4 C) (Oral)   Ht 5\' 8"  (1.727 m)   Wt 188 lb 3.2 oz (85.4 kg)   SpO2  98%   BMI 28.62 kg/m  Wt Readings from Last 3 Encounters:  03/22/19 188 lb 3.2 oz (85.4 kg)  12/19/18 192 lb (87.1 kg)  09/09/18 173 lb 3.2 oz (78.6 kg)     There are no preventive care reminders to display for this patient.  There are no preventive care reminders to display for this patient.  Lab Results  Component Value Date   TSH 0.78 09/11/2016   Lab Results  Component Value Date   WBC 19.8 (H) 12/19/2018   HGB 11.0 (L) 12/19/2018   HCT 32.2 (L) 12/19/2018   MCV 96 12/19/2018   PLT 610 (H) 12/19/2018   Lab Results  Component Value Date   NA 140 12/19/2018   K 4.8 12/19/2018   CO2 23 12/19/2018   GLUCOSE 82 12/19/2018   BUN 11 12/19/2018   CREATININE 0.79 12/19/2018   BILITOT 1.1 12/19/2018   ALKPHOS 62 12/19/2018   AST 28 12/19/2018   ALT 18 12/19/2018   PROT 7.1 12/19/2018   ALBUMIN 4.5 12/19/2018   CALCIUM 9.4 12/19/2018   ANIONGAP 9 08/01/2018   No results found for: CHOL No results found for: HDL No results found for: LDLCALC No results found for: TRIG No results found for: [facility] Lab Results  Component Value Date   HGBA1C <4.0 09/07/2013   Assessment & Plan:   1. Abnormal urine - POCT Urinalysis Dipstick  2. Hb-SS disease without crisis [facility]) He is doing well today. He will continue to take pain medications as prescribed; will continue to avoid extreme heat and cold; will continue to eat a healthy diet and drink at least 64 ounces of water daily; continue stool softener as needed; will avoid colds and flu; will continue to get plenty of sleep and rest; will continue  to avoid high stressful situations and remain infection free; will continue Folic Acid 1 mg daily to avoid sickle cell crisis.  - Hemoglobinopathy evaluation  3. Sickle cell disease, type SS (HCC) - gabapentin (NEURONTIN) 600 MG tablet; TAKE 1 TABLET(600 MG) BY MOUTH TWICE DAILY  Dispense: 60 tablet; Refill: 3  4. Chronic prescription opiate use We will refill Gabapentin today.  - gabapentin (NEURONTIN) 600 MG tablet; TAKE 1 TABLET(600 MG) BY MOUTH TWICE DAILY  Dispense: 60 tablet; Refill: 3  5. Insomnia, unspecified type - traZODone (DESYREL) 100 MG tablet; Take 1 tablet (100 mg total) by mouth at bedtime.  Dispense: 30 tablet; Refill: 3  6. Constipation due to opioid therapy Stable today.   7. Follow up He will follow up in 3 months.   Meds ordered this encounter  Medications  . gabapentin (NEURONTIN) 600 MG tablet    Sig: TAKE 1 TABLET(600 MG) BY MOUTH TWICE DAILY    Dispense:  60 tablet    Refill:  3  . traZODone (DESYREL) 100 MG tablet    Sig: Take 1 tablet (100 mg total) by mouth at bedtime.    Dispense:  30 tablet    Refill:  3   Orders Placed This Encounter  Procedures  . Hemoglobinopathy evaluation  . POCT Urinalysis Dipstick    Referral Orders  No referral(s) requested today    Kathe Becton,  MSN, FNP-BC [place] [address] [place], [place] [address] [phone] fax   Problem List Items Addressed This Visit      Other   Hb-SS disease without crisis [facility])   Relevant Orders   Hemoglobinopathy  evaluation   Sickle cell disease, type SS (HCC)   Relevant Medications   gabapentin (NEURONTIN) 600 MG tablet    Other Visit Diagnoses    Abnormal urine    -  Primary   Relevant Orders   POCT Urinalysis Dipstick (Completed)   Chronic prescription opiate use       Relevant Medications   gabapentin (NEURONTIN) 600 MG tablet   Insomnia, unspecified type       Relevant  Medications   traZODone (DESYREL) 100 MG tablet   Constipation due to opioid therapy       Follow up          Meds ordered this encounter  Medications  . gabapentin (NEURONTIN) 600 MG tablet    Sig: TAKE 1 TABLET(600 MG) BY MOUTH TWICE DAILY    Dispense:  60 tablet    Refill:  3  . traZODone (DESYREL) 100 MG tablet    Sig: Take 1 tablet (100 mg total) by mouth at bedtime.    Dispense:  30 tablet    Refill:  3    Follow-up: Return in about 3 months (around 06/21/2019).    Azzie Glatter, FNP

## 2019-03-23 ENCOUNTER — Other Ambulatory Visit: Payer: Self-pay | Admitting: Family Medicine

## 2019-03-23 DIAGNOSIS — Z79891 Long term (current) use of opiate analgesic: Secondary | ICD-10-CM | POA: Insufficient documentation

## 2019-03-23 DIAGNOSIS — G4700 Insomnia, unspecified: Secondary | ICD-10-CM | POA: Insufficient documentation

## 2019-03-23 DIAGNOSIS — D571 Sickle-cell disease without crisis: Secondary | ICD-10-CM

## 2019-03-24 ENCOUNTER — Telehealth: Payer: Self-pay | Admitting: Family Medicine

## 2019-03-24 NOTE — Telephone Encounter (Signed)
Pt is requesting a refill please advise  ?

## 2019-03-25 ENCOUNTER — Other Ambulatory Visit: Payer: Self-pay | Admitting: Family Medicine

## 2019-03-25 DIAGNOSIS — D571 Sickle-cell disease without crisis: Secondary | ICD-10-CM

## 2019-03-25 DIAGNOSIS — Z79891 Long term (current) use of opiate analgesic: Secondary | ICD-10-CM

## 2019-03-25 MED ORDER — OXYCODONE HCL 20 MG PO TABS: 1.0000 | ORAL_TABLET | Freq: Four times a day (QID) | ORAL | 0 refills | Status: DC | PRN

## 2019-03-27 LAB — HEMOGLOBINOPATHY EVALUATION
HGB C: 0 %
HGB S: 78.7 % — ABNORMAL HIGH
HGB VARIANT: 0 %
Hemoglobin A2 Quantitation: 4.6 % — ABNORMAL HIGH (ref 1.8–3.2)
Hemoglobin F Quantitation: 16.7 % — ABNORMAL HIGH (ref 0.0–2.0)
Hgb A: 0 % — ABNORMAL LOW (ref 96.4–98.8)

## 2019-03-27 NOTE — Telephone Encounter (Signed)
Forwarding to [facility] [employer]

## 2019-03-27 NOTE — Telephone Encounter (Signed)
Patient informed of Rx availability

## 2019-04-06 ENCOUNTER — Telehealth: Payer: Self-pay | Admitting: Family Medicine

## 2019-04-06 ENCOUNTER — Other Ambulatory Visit: Payer: Self-pay | Admitting: Family Medicine

## 2019-04-06 DIAGNOSIS — D571 Sickle-cell disease without crisis: Secondary | ICD-10-CM

## 2019-04-06 DIAGNOSIS — Z79891 Long term (current) use of opiate analgesic: Secondary | ICD-10-CM

## 2019-04-06 MED ORDER — OXYCODONE HCL 20 MG PO TABS: 1.0000 | ORAL_TABLET | Freq: Four times a day (QID) | ORAL | 0 refills | Status: DC | PRN

## 2019-04-06 NOTE — Telephone Encounter (Signed)
Refill request for oxycodone. Please advise.  

## 2019-04-21 ENCOUNTER — Telehealth: Payer: Self-pay | Admitting: Internal Medicine

## 2019-04-21 ENCOUNTER — Other Ambulatory Visit: Payer: Self-pay | Admitting: Family Medicine

## 2019-04-21 DIAGNOSIS — D571 Sickle-cell disease without crisis: Secondary | ICD-10-CM

## 2019-04-21 DIAGNOSIS — Z79891 Long term (current) use of opiate analgesic: Secondary | ICD-10-CM

## 2019-04-21 MED ORDER — OXYCODONE HCL 20 MG PO TABS: 1.0000 | ORAL_TABLET | Freq: Four times a day (QID) | ORAL | 0 refills | Status: DC | PRN

## 2019-04-21 NOTE — Telephone Encounter (Signed)
Refill request for oxycodone and trazodone. Please advise. Thanks!

## 2019-04-27 DIAGNOSIS — H5213 Myopia, bilateral: Secondary | ICD-10-CM | POA: Diagnosis not present

## 2019-05-05 ENCOUNTER — Telehealth: Payer: Self-pay | Admitting: Internal Medicine

## 2019-05-05 NOTE — Telephone Encounter (Signed)
Refill request for oxycodone. Please advise.  

## 2019-05-07 ENCOUNTER — Other Ambulatory Visit: Payer: Self-pay | Admitting: Internal Medicine

## 2019-05-07 DIAGNOSIS — D571 Sickle-cell disease without crisis: Secondary | ICD-10-CM

## 2019-05-07 DIAGNOSIS — Z79891 Long term (current) use of opiate analgesic: Secondary | ICD-10-CM

## 2019-05-07 MED ORDER — OXYCODONE HCL 20 MG PO TABS: 1.0000 | ORAL_TABLET | Freq: Four times a day (QID) | ORAL | 0 refills | Status: DC | PRN

## 2019-05-07 NOTE — Telephone Encounter (Signed)
Refilled

## 2019-05-16 DIAGNOSIS — H5213 Myopia, bilateral: Secondary | ICD-10-CM | POA: Diagnosis not present

## 2019-05-22 ENCOUNTER — Telehealth: Payer: Self-pay

## 2019-05-22 ENCOUNTER — Other Ambulatory Visit: Payer: Self-pay | Admitting: Internal Medicine

## 2019-05-22 DIAGNOSIS — D571 Sickle-cell disease without crisis: Secondary | ICD-10-CM

## 2019-05-22 DIAGNOSIS — Z79891 Long term (current) use of opiate analgesic: Secondary | ICD-10-CM

## 2019-05-22 MED ORDER — OXYCODONE HCL 20 MG PO TABS: 1.0000 | ORAL_TABLET | Freq: Four times a day (QID) | ORAL | 0 refills | Status: DC | PRN

## 2019-05-22 NOTE — Telephone Encounter (Signed)
Called, no answer. Left a message med requested has been sent to pharmacy. Thanks!

## 2019-05-22 NOTE — Telephone Encounter (Signed)
Alejandro Soto,  Can you refill this for patient?

## 2019-05-22 NOTE — Telephone Encounter (Signed)
Refill request for oxycodone 20mg . Please advise. Due today.

## 2019-05-22 NOTE — Telephone Encounter (Signed)
Refilled

## 2019-06-05 ENCOUNTER — Other Ambulatory Visit: Payer: Self-pay | Admitting: Family Medicine

## 2019-06-05 ENCOUNTER — Telehealth: Payer: Self-pay | Admitting: Internal Medicine

## 2019-06-05 DIAGNOSIS — D571 Sickle-cell disease without crisis: Secondary | ICD-10-CM

## 2019-06-05 DIAGNOSIS — Z79891 Long term (current) use of opiate analgesic: Secondary | ICD-10-CM

## 2019-06-05 NOTE — Telephone Encounter (Signed)
Refill request for oxycodone. Please advise.  

## 2019-06-06 ENCOUNTER — Other Ambulatory Visit: Payer: Medicaid Other

## 2019-06-06 ENCOUNTER — Other Ambulatory Visit: Payer: Self-pay | Admitting: Family Medicine

## 2019-06-06 ENCOUNTER — Other Ambulatory Visit: Payer: Self-pay

## 2019-06-06 DIAGNOSIS — D571 Sickle-cell disease without crisis: Secondary | ICD-10-CM | POA: Diagnosis not present

## 2019-06-06 DIAGNOSIS — Z79891 Long term (current) use of opiate analgesic: Secondary | ICD-10-CM | POA: Diagnosis not present

## 2019-06-06 MED ORDER — OXYCODONE HCL 20 MG PO TABS: 1.0000 | ORAL_TABLET | Freq: Four times a day (QID) | ORAL | 0 refills | Status: DC | PRN

## 2019-06-06 NOTE — Telephone Encounter (Signed)
Patient came in and provided urine sample today.

## 2019-06-06 NOTE — Telephone Encounter (Signed)
Called, no answer. Left a message that rx was sent to pharmacy. Thanks!

## 2019-06-09 LAB — DRUG SCREEN 764883 11+OXYCO+ALC+CRT-BUND
Amphetamines, Urine: NEGATIVE ng/mL
BENZODIAZ UR QL: NEGATIVE ng/mL
Barbiturate: NEGATIVE ng/mL
Cannabinoid Quant, Ur: NEGATIVE ng/mL
Cocaine (Metabolite): NEGATIVE ng/mL
Creatinine: 17 mg/dL — ABNORMAL LOW (ref 20.0–300.0)
Ethanol: NEGATIVE %
Meperidine: NEGATIVE ng/mL
Methadone Screen, Urine: NEGATIVE ng/mL
OPIATE SCREEN URINE: NEGATIVE ng/mL
Phencyclidine: NEGATIVE ng/mL
Propoxyphene: NEGATIVE ng/mL
Tramadol: NEGATIVE ng/mL
pH, Urine: 7.1 (ref 4.5–8.9)

## 2019-06-09 LAB — OXYCODONE/OXYMORPHONE, CONFIRM
OXYCODONE/OXYMORPH: POSITIVE — AB
OXYCODONE: 1029 ng/mL
OXYCODONE: POSITIVE — AB
OXYMORPHONE (GC/MS): 698 ng/mL
OXYMORPHONE: POSITIVE — AB

## 2019-06-09 LAB — SPECIFIC GRAVITY: Specific Gravity: 1.0029

## 2019-06-21 ENCOUNTER — Other Ambulatory Visit: Payer: Self-pay

## 2019-06-21 ENCOUNTER — Encounter: Payer: Self-pay | Admitting: Family Medicine

## 2019-06-21 ENCOUNTER — Ambulatory Visit (INDEPENDENT_AMBULATORY_CARE_PROVIDER_SITE_OTHER): Payer: Medicaid Other | Admitting: Family Medicine

## 2019-06-21 VITALS — BP 112/63 | HR 91 | Temp 98.4°F | Ht 68.0 in | Wt 190.8 lb

## 2019-06-21 DIAGNOSIS — D571 Sickle-cell disease without crisis: Secondary | ICD-10-CM

## 2019-06-21 DIAGNOSIS — G4700 Insomnia, unspecified: Secondary | ICD-10-CM | POA: Diagnosis not present

## 2019-06-21 DIAGNOSIS — Z79891 Long term (current) use of opiate analgesic: Secondary | ICD-10-CM | POA: Diagnosis not present

## 2019-06-21 DIAGNOSIS — Z09 Encounter for follow-up examination after completed treatment for conditions other than malignant neoplasm: Secondary | ICD-10-CM | POA: Diagnosis not present

## 2019-06-21 DIAGNOSIS — G894 Chronic pain syndrome: Secondary | ICD-10-CM | POA: Diagnosis not present

## 2019-06-21 DIAGNOSIS — T402X5A Adverse effect of other opioids, initial encounter: Secondary | ICD-10-CM

## 2019-06-21 DIAGNOSIS — Z87898 Personal history of other specified conditions: Secondary | ICD-10-CM

## 2019-06-21 DIAGNOSIS — K5903 Drug induced constipation: Secondary | ICD-10-CM | POA: Diagnosis not present

## 2019-06-21 DIAGNOSIS — F1491 Cocaine use, unspecified, in remission: Secondary | ICD-10-CM

## 2019-06-21 LAB — POCT URINALYSIS DIPSTICK
Bilirubin, UA: NEGATIVE
Blood, UA: NEGATIVE
Glucose, UA: NEGATIVE
Leukocytes, UA: NEGATIVE
Nitrite, UA: NEGATIVE
Protein, UA: POSITIVE — AB
Spec Grav, UA: 1.02 (ref 1.010–1.025)
Urobilinogen, UA: 0.2 E.U./dL
pH, UA: 5.5 (ref 5.0–8.0)

## 2019-06-21 MED ORDER — OXYCODONE HCL 20 MG PO TABS: 1.0000 | ORAL_TABLET | Freq: Four times a day (QID) | ORAL | 0 refills | Status: DC | PRN

## 2019-06-21 NOTE — Progress Notes (Signed)
Patient [facility]   Established Patient Office Visit  Subjective:  Patient ID: Alejandro Soto, male    DOB: Mar 04, 1986  Age: 33 y.o. MRN: [id number]  CC:  Chief Complaint  Patient presents with  . Follow-up    Sickle Cell     HPI Alejandro Soto is a 33 year old male who presents for Follow Up today.   Past Medical History:  Diagnosis Date  . Pneumonia   . Sickle cell anemia (HCC)    Current Status: Since his last office visit, he is doing well with no complaints. He states that he has pain in his back and joints. He rates his pain today at 6/10. He has not had a hospital visit for Sickle Cell Crisis since 01/15/2018 where he was treated and discharged the same day. He is currently taking all medications as prescribed and staying well hydrated. He reports occasional nausea, constipation, dizziness and headaches. He has been having problems with getting his medications, because Disability was discontinued 02/2019. He has previously been on disability since 2009. His anxiety is mild today. He denies suicidal ideations, homicidal ideations, or auditory hallucinations. He was recently seeing a Teacher, music at [facility] in [place], [place], so he has not picked up [place] as of yet. He states that he no longer sees Psychiatry in [place]. He denies fevers, chills, fatigue, recent infections, weight loss, and night sweats. He has not had any visual changes, and falls. No chest pain, heart palpitations, cough and shortness of breath reported. No reports of GI problems such as vomiting, and diarrhea. He has no reports of blood in stools, dysuria and hematuria.   Past Surgical History:  Procedure Laterality Date  . NO PAST SURGERIES      Family History  Problem Relation Age of Onset  . Cancer Maternal Grandmother        colon   . Diabetes Father   . Cancer Father 23       Prostate  . Asthma Brother     Social History   Socioeconomic History  .  Marital status: Single    Spouse name: Not on file  . Number of children: Not on file  . Years of education: Not on file  . Highest education level: Not on file  Occupational History  . Not on file  Tobacco Use  . Smoking status: Current Some Day Smoker    Packs/day: 0.25    Years: 5.00    Pack years: 1.25    Types: Cigarettes  . Smokeless tobacco: Never Used  Substance and Sexual Activity  . Alcohol use: No    Alcohol/week: 0.0 standard drinks  . Drug use: No  . Sexual activity: Yes  Other Topics Concern  . Not on file  Social History Narrative  . Not on file   Social Determinants of Health   Financial Resource Strain:   . Difficulty of Paying Living Expenses: Not on file  Food Insecurity:   . Worried About Charity fundraiser in the Last Year: Not on file  . Ran Out of Food in the Last Year: Not on file  Transportation Needs:   . Lack of Transportation (Medical): Not on file  . Lack of Transportation (Non-Medical): Not on file  Physical Activity:   . Days of Exercise per Week: Not on file  . Minutes of Exercise per Session: Not on file  Stress:   . Feeling of Stress : Not on file  Social  Connections:   . Frequency of Communication with Friends and Family: Not on file  . Frequency of Social Gatherings with Friends and Family: Not on file  . Attends Religious Services: Not on file  . Active Member of Clubs or Organizations: Not on file  . Attends Archivist Meetings: Not on file  . Marital Status: Not on file  Intimate Partner Violence:   . Fear of Current or Ex-Partner: Not on file  . Emotionally Abused: Not on file  . Physically Abused: Not on file  . Sexually Abused: Not on file    Outpatient Medications Prior to Visit  Medication Sig Dispense Refill  . folic acid (FOLVITE) 1 MG tablet Take 1 tablet (1 mg total) by mouth daily. 30 tablet 11  . gabapentin (NEURONTIN) 600 MG tablet TAKE 1 TABLET(600 MG) BY MOUTH TWICE DAILY 60 tablet 3  .  hydroxyurea (HYDREA) 500 MG capsule Take 3 capsules (1,500 mg total) by mouth daily. May take with food to minimize GI side effects. 270 capsule 3  . ibuprofen (ADVIL,MOTRIN) 800 MG tablet Take 1 tablet (800 mg total) by mouth every 8 (eight) hours as needed for mild pain. 90 tablet 0  . Multiple Vitamin (MULTIVITAMIN) tablet Take 2 tablets by mouth daily.     . naloxegol oxalate (MOVANTIK) 25 MG TABS tablet Take 1 tablet (25 mg total) by mouth daily. 30 tablet 2  . traZODone (DESYREL) 100 MG tablet Take 1 tablet (100 mg total) by mouth at bedtime. 30 tablet 3  . Oxycodone HCl 20 MG TABS Take 1 tablet (20 mg total) by mouth every 6 (six) hours as needed for up to 15 days. 60 tablet 0  . busPIRone (BUSPAR) 5 MG tablet Take 1 tablet (5 mg total) by mouth 2 (two) times daily. (Patient not taking: Reported on 01/20/2018) 30 tablet 1  . REXULTI 1 MG TABS     . triamcinolone (KENALOG) 0.025 % ointment Apply 1 application topically 2 (two) times daily. 30 g 2   No facility-administered medications prior to visit.    Allergies  Allergen Reactions  . Amoxicillin Diarrhea    Did it involve swelling of the face/tongue/throat, SOB, or low BP? No Did it involve sudden or severe rash/hives, skin peeling, or any reaction on the inside of your mouth or nose? No Did you need to seek medical attention at a hospital or doctor's office? No When did it last happen?childhood If all above answers are "NO", may proceed with cephalosporin use.     ROS Review of Systems  Constitutional: Negative.   HENT: Negative.   Eyes: Negative.   Respiratory: Negative.   Cardiovascular: Negative.   Gastrointestinal: Positive for abdominal distention, constipation (occasional ) and nausea (occasional).  Endocrine: Negative.   Genitourinary: Negative.   Musculoskeletal: Positive for arthralgias (generalized).  Skin: Negative.   Allergic/Immunologic: Negative.   Neurological: Positive for dizziness (occasional ) and  headaches (occasional ).  Hematological: Negative.   Psychiatric/Behavioral: Negative.       Objective:    Physical Exam  Constitutional: He is oriented to person, place, and time. He appears well-developed and well-nourished.  HENT:  Head: Normocephalic and atraumatic.  Eyes: Conjunctivae are normal.  Cardiovascular: Normal rate, regular rhythm, normal heart sounds and intact distal pulses.  Pulmonary/Chest: Effort normal and breath sounds normal.  Abdominal: Soft. He exhibits distension (obese).  Musculoskeletal:        General: Normal range of motion.     Cervical back: Normal  range of motion and neck supple.  Neurological: He is alert and oriented to person, place, and time. He has normal reflexes.  Skin: Skin is warm and dry.  Psychiatric: He has a normal mood and affect. His behavior is normal. Judgment and thought content normal.  Nursing note and vitals reviewed.   BP 112/63   Pulse 91   Temp 98.4 F (36.9 C) (Oral)   Ht 5\' 8"  (1.727 m)   Wt 190 lb 12.8 oz (86.5 kg)   SpO2 95%   BMI 29.01 kg/m  Wt Readings from Last 3 Encounters:  06/21/19 190 lb 12.8 oz (86.5 kg)  03/22/19 188 lb 3.2 oz (85.4 kg)  12/19/18 192 lb (87.1 kg)     There are no preventive care reminders to display for this patient.  There are no preventive care reminders to display for this patient.  Lab Results  Component Value Date   TSH 0.78 09/11/2016   Lab Results  Component Value Date   WBC 19.8 (H) 12/19/2018   HGB 11.0 (L) 12/19/2018   HCT 32.2 (L) 12/19/2018   MCV 96 12/19/2018   PLT 610 (H) 12/19/2018   Lab Results  Component Value Date   NA 140 12/19/2018   K 4.8 12/19/2018   CO2 23 12/19/2018   GLUCOSE 82 12/19/2018   BUN 11 12/19/2018   CREATININE 0.79 12/19/2018   BILITOT 1.1 12/19/2018   ALKPHOS 62 12/19/2018   AST 28 12/19/2018   ALT 18 12/19/2018   PROT 7.1 12/19/2018   ALBUMIN 4.5 12/19/2018   CALCIUM 9.4 12/19/2018   ANIONGAP 9 08/01/2018   No results  found for: CHOL No results found for: HDL No results found for: LDLCALC No results found for: TRIG No results found for: [facility] Lab Results  Component Value Date   HGBA1C <4.0 09/07/2013   Assessment & Plan:   1. Sickle cell disease, type SS (HCC) - Oxycodone HCl 20 MG TABS; Take 1 tablet (20 mg total) by mouth every 6 (six) hours as needed for up to 15 days.  Dispense: 60 tablet; Refill: 0  2. Hb-SS disease without crisis [facility]) He is doing well today. He will continue to take pain medications as prescribed; will continue to avoid extreme heat and cold; will continue to eat a healthy diet and drink at least 64 ounces of water daily; continue stool softener as needed; will avoid colds and flu; will continue to get plenty of sleep and rest; will continue to avoid high stressful situations and remain infection free; will continue Folic Acid 1 mg daily to avoid sickle cell crisis. Continue to follow up with Hematologist as needed.   3. Chronic prescription opiate use - Oxycodone HCl 20 MG TABS; Take 1 tablet (20 mg total) by mouth every 6 (six) hours as needed for up to 15 days.  Dispense: 60 tablet; Refill: 0  4. Chronic pain syndrome  5. History of cocaine use  6. Constipation due to opioid therapy  7. Insomnia, unspecified type  8. Follow up He will follow up in 2-3 months.   Meds ordered this encounter  Medications  . Oxycodone HCl 20 MG TABS    Sig: Take 1 tablet (20 mg total) by mouth every 6 (six) hours as needed for up to 15 days.    Dispense:  60 tablet    Refill:  0    Order Specific Question:   Supervising Provider    Answer:   Tresa Garter [id number]  No orders of the defined types were placed in this encounter.   Referral Orders  No referral(s) requested today    Kathe Becton,  MSN, FNP-BC [place], [place] [address] [phone]-  fax  Problem List Items Addressed This Visit      Other   Chronic pain syndrome   Relevant Medications   Oxycodone HCl 20 MG TABS   Chronic prescription opiate use   Relevant Medications   Oxycodone HCl 20 MG TABS   Hb-SS disease without crisis ([place])   Insomnia   Sickle cell disease, type SS ([place]) - Primary   Relevant Medications   Oxycodone HCl 20 MG TABS    Other Visit Diagnoses    History of cocaine use       Constipation due to opioid therapy       Follow up          Meds ordered this encounter  Medications  . Oxycodone HCl 20 MG TABS    Sig: Take 1 tablet (20 mg total) by mouth every 6 (six) hours as needed for up to 15 days.    Dispense:  60 tablet    Refill:  0    Order Specific Question:   Supervising Provider    Answer:   Tresa Garter [id number]    Follow-up: No follow-ups on file.    Azzie Glatter, FNP

## 2019-07-03 ENCOUNTER — Telehealth: Payer: Self-pay | Admitting: Nurse Practitioner

## 2019-07-03 ENCOUNTER — Other Ambulatory Visit: Payer: Self-pay | Admitting: Family Medicine

## 2019-07-03 DIAGNOSIS — Z79891 Long term (current) use of opiate analgesic: Secondary | ICD-10-CM

## 2019-07-03 DIAGNOSIS — D571 Sickle-cell disease without crisis: Secondary | ICD-10-CM

## 2019-07-03 MED ORDER — OXYCODONE HCL 20 MG PO TABS: 1.0000 | ORAL_TABLET | Freq: Four times a day (QID) | ORAL | 0 refills | Status: DC | PRN

## 2019-07-05 NOTE — Telephone Encounter (Signed)
done

## 2019-07-19 ENCOUNTER — Telehealth: Payer: Self-pay | Admitting: Family Medicine

## 2019-07-19 ENCOUNTER — Encounter: Payer: Self-pay | Admitting: Nurse Practitioner

## 2019-07-19 ENCOUNTER — Other Ambulatory Visit: Payer: Self-pay | Admitting: Nurse Practitioner

## 2019-07-19 DIAGNOSIS — D571 Sickle-cell disease without crisis: Secondary | ICD-10-CM

## 2019-07-19 DIAGNOSIS — Z79891 Long term (current) use of opiate analgesic: Secondary | ICD-10-CM

## 2019-07-19 MED ORDER — OXYCODONE HCL 20 MG PO TABS: 1.0000 | ORAL_TABLET | Freq: Four times a day (QID) | ORAL | 0 refills | Status: DC | PRN

## 2019-07-20 NOTE — Telephone Encounter (Signed)
done

## 2019-08-01 ENCOUNTER — Other Ambulatory Visit: Payer: Self-pay | Admitting: Family Medicine

## 2019-08-01 ENCOUNTER — Telehealth: Payer: Self-pay | Admitting: Family Medicine

## 2019-08-01 ENCOUNTER — Ambulatory Visit: Payer: Medicaid Other | Admitting: Clinical

## 2019-08-01 DIAGNOSIS — D571 Sickle-cell disease without crisis: Secondary | ICD-10-CM

## 2019-08-01 DIAGNOSIS — Z79891 Long term (current) use of opiate analgesic: Secondary | ICD-10-CM

## 2019-08-01 MED ORDER — OXYCODONE HCL 20 MG PO TABS: 1.0000 | ORAL_TABLET | Freq: Four times a day (QID) | ORAL | 0 refills | Status: DC | PRN

## 2019-08-01 MED ORDER — HYDROXYUREA 500 MG PO CAPS: 1500.0000 mg | ORAL_CAPSULE | Freq: Every day | ORAL | 3 refills | Status: DC

## 2019-08-01 NOTE — Telephone Encounter (Signed)
done

## 2019-08-01 NOTE — Telephone Encounter (Signed)
error 

## 2019-08-02 ENCOUNTER — Ambulatory Visit: Payer: Medicaid Other | Admitting: Clinical

## 2019-08-02 ENCOUNTER — Other Ambulatory Visit: Payer: Self-pay

## 2019-08-02 DIAGNOSIS — D571 Sickle-cell disease without crisis: Secondary | ICD-10-CM

## 2019-08-02 NOTE — Progress Notes (Signed)
[facility] Referral Note  Reason for Referral: Alejandro Soto is a 34 y.o. male  Pt was referred by receptionist for: social security disability issues   Pt reports the following concerns: disability income was stopped  Assessment: Patient reported his social security disability was stopped in September last year and he received a letter that he would need to go to court regarding the matter. Patient reported he is working on obtaining a Chief Executive Officer. He has a Product/process development scientist at [facility] [employer] who is aware of the issue and is also providing support.  Plan: 1. Addressed today: Obtained patient's consent to coordinate with [facility] regarding the matter. Spoke to Education officer, museum there who indicated she would follow up with patient and support with resolving this issue. CSW will remain available to assist if needed.   2. Referral: [facility] and [facility])  3. Follow up: as needed  Estanislado Emms, [facility] [phone]

## 2019-08-15 ENCOUNTER — Telehealth: Payer: Self-pay | Admitting: Family Medicine

## 2019-08-16 ENCOUNTER — Other Ambulatory Visit: Payer: Self-pay | Admitting: Nurse Practitioner

## 2019-08-16 DIAGNOSIS — D571 Sickle-cell disease without crisis: Secondary | ICD-10-CM

## 2019-08-16 DIAGNOSIS — Z79891 Long term (current) use of opiate analgesic: Secondary | ICD-10-CM

## 2019-08-16 MED ORDER — OXYCODONE HCL 20 MG PO TABS: 1.0000 | ORAL_TABLET | Freq: Four times a day (QID) | ORAL | 0 refills | Status: DC | PRN

## 2019-08-16 NOTE — Telephone Encounter (Signed)
[facility] in [place] is the pharmacy he uses.

## 2019-08-16 NOTE — Telephone Encounter (Signed)
Please verify the patients pharmacy  Thanks

## 2019-08-21 DIAGNOSIS — G4700 Insomnia, unspecified: Secondary | ICD-10-CM

## 2019-08-21 HISTORY — DX: Insomnia, unspecified: G47.00

## 2019-08-29 ENCOUNTER — Telehealth: Payer: Self-pay | Admitting: Family Medicine

## 2019-08-30 ENCOUNTER — Other Ambulatory Visit: Payer: Self-pay | Admitting: Nurse Practitioner

## 2019-08-30 DIAGNOSIS — Z79891 Long term (current) use of opiate analgesic: Secondary | ICD-10-CM

## 2019-08-30 DIAGNOSIS — D571 Sickle-cell disease without crisis: Secondary | ICD-10-CM

## 2019-08-30 MED ORDER — OXYCODONE HCL 20 MG PO TABS: 1.0000 | ORAL_TABLET | Freq: Four times a day (QID) | ORAL | 0 refills | Status: DC | PRN

## 2019-08-30 NOTE — Telephone Encounter (Signed)
Sent fill date 09/02/19

## 2019-09-13 ENCOUNTER — Telehealth: Payer: Self-pay | Admitting: Nurse Practitioner

## 2019-09-13 NOTE — Telephone Encounter (Signed)
sent 

## 2019-09-15 ENCOUNTER — Other Ambulatory Visit: Payer: Self-pay | Admitting: Nurse Practitioner

## 2019-09-15 DIAGNOSIS — D571 Sickle-cell disease without crisis: Secondary | ICD-10-CM

## 2019-09-15 DIAGNOSIS — Z79891 Long term (current) use of opiate analgesic: Secondary | ICD-10-CM

## 2019-09-15 MED ORDER — OXYCODONE HCL 20 MG PO TABS: 1.0000 | ORAL_TABLET | Freq: Four times a day (QID) | ORAL | 0 refills | Status: DC | PRN

## 2019-09-19 ENCOUNTER — Ambulatory Visit (INDEPENDENT_AMBULATORY_CARE_PROVIDER_SITE_OTHER): Payer: Medicaid Other | Admitting: Family Medicine

## 2019-09-19 ENCOUNTER — Encounter: Payer: Self-pay | Admitting: Family Medicine

## 2019-09-19 ENCOUNTER — Other Ambulatory Visit: Payer: Self-pay

## 2019-09-19 VITALS — BP 127/74 | HR 98 | Temp 98.1°F | Ht 68.0 in | Wt 196.0 lb

## 2019-09-19 DIAGNOSIS — D571 Sickle-cell disease without crisis: Secondary | ICD-10-CM | POA: Diagnosis not present

## 2019-09-19 DIAGNOSIS — Z09 Encounter for follow-up examination after completed treatment for conditions other than malignant neoplasm: Secondary | ICD-10-CM

## 2019-09-19 DIAGNOSIS — G894 Chronic pain syndrome: Secondary | ICD-10-CM | POA: Diagnosis not present

## 2019-09-19 DIAGNOSIS — G4700 Insomnia, unspecified: Secondary | ICD-10-CM

## 2019-09-19 DIAGNOSIS — T402X5A Adverse effect of other opioids, initial encounter: Secondary | ICD-10-CM

## 2019-09-19 DIAGNOSIS — Z79891 Long term (current) use of opiate analgesic: Secondary | ICD-10-CM

## 2019-09-19 DIAGNOSIS — K5903 Drug induced constipation: Secondary | ICD-10-CM

## 2019-09-19 LAB — POCT URINALYSIS DIPSTICK
Bilirubin, UA: NEGATIVE
Blood, UA: NEGATIVE
Glucose, UA: NEGATIVE
Ketones, UA: NEGATIVE
Leukocytes, UA: NEGATIVE
Nitrite, UA: NEGATIVE
Protein, UA: NEGATIVE
Spec Grav, UA: <=1.005 — AB (ref 1.010–1.025)
Urobilinogen, UA: 0.2 E.U./dL
pH, UA: 5.5 (ref 5.0–8.0)

## 2019-09-19 MED ORDER — GABAPENTIN 600 MG PO TABS: ORAL_TABLET | ORAL | 6 refills | Status: DC

## 2019-09-19 MED ORDER — TRAZODONE HCL 150 MG PO TABS: 150.0000 mg | ORAL_TABLET | Freq: Every day | ORAL | 6 refills | Status: DC

## 2019-09-19 MED FILL — GABAPENTIN 600 MG TABLET: 600 | 30 days supply | Qty: 60 | Fill #0

## 2019-09-19 MED FILL — traZODone HCL 150 MG TABS: 150 | 30 days supply | Qty: 30 | Fill #0

## 2019-09-19 NOTE — Progress Notes (Signed)
Patient [facility]   Established Patient Office Visit  Subjective:  Patient ID: Alejandro Soto, male    DOB: 07-Aug-1985  Age: 34 y.o. MRN: [id number]  CC:  Chief Complaint  Patient presents with  . Sickle Cell Anemia    HPI Alejandro Soto is a 34 year old male who presents for Follow Up today.   Past Medical History:  Diagnosis Date  . Insomnia 08/2019  . Pneumonia   . Sickle cell anemia (HCC)    Current Status: Since his last office visit, he is doing well with no complaints. He states that he has pain in his back, legs, and other joints. He rates his pain today at 4/10. He has not had a hospital visit for Sickle Cell Crisis since 12/2017 where her was treated and discharged the same day. He is currently taking all medications as prescribed and staying well hydrated. He reports occasional nausea, constipation, dizziness and headaches. He not longer follows up with Psychiatry in [place], [place] since last year. He denies fevers, chills, fatigue, recent infections, weight loss, and night sweats. He has not had any visual changes, and falls. No chest pain, heart palpitations, cough and shortness of breath reported. No reports of GI problems such as vomiting and diarrhea. He has no reports of blood in stools, dysuria and hematuria. His anxiety is moderate today, as he has not been sleeping lately, which he states that Trazodone is not effective.  He denies suicidal ideations, homicidal ideations, or auditory hallucinations. He is taking all medications as prescribed.      Past Surgical History:  Procedure Laterality Date  . NO PAST SURGERIES      Family History  Problem Relation Age of Onset  . Cancer Maternal Grandmother        colon   . Diabetes Father   . Cancer Father 109       Prostate  . Asthma Brother     Social History   Socioeconomic History  . Marital status: Single    Spouse name: Not on file  . Number of children: Not on  file  . Years of education: Not on file  . Highest education level: Not on file  Occupational History  . Not on file  Tobacco Use  . Smoking status: Current Some Day Smoker    Packs/day: 0.25    Years: 5.00    Pack years: 1.25    Types: Cigarettes  . Smokeless tobacco: Never Used  Substance and Sexual Activity  . Alcohol use: No    Alcohol/week: 0.0 standard drinks  . Drug use: No  . Sexual activity: Yes  Other Topics Concern  . Not on file  Social History Narrative  . Not on file   Social Determinants of Health   Financial Resource Strain:   . Difficulty of Paying Living Expenses:   Food Insecurity:   . Worried About Charity fundraiser in the Last Year:   . Arboriculturist in the Last Year:   Transportation Needs:   . Film/video editor (Medical):   Marland Kitchen Lack of Transportation (Non-Medical):   Physical Activity:   . Days of Exercise per Week:   . Minutes of Exercise per Session:   Stress:   . Feeling of Stress :   Social Connections:   . Frequency of Communication with Friends and Family:   . Frequency of Social Gatherings with Friends and Family:   . Attends Religious  Services:   . Active Member of Clubs or Organizations:   . Attends Archivist Meetings:   Marland Kitchen Marital Status:   Intimate Partner Violence:   . Fear of Current or Ex-Partner:   . Emotionally Abused:   Marland Kitchen Physically Abused:   . Sexually Abused:     Outpatient Medications Prior to Visit  Medication Sig Dispense Refill  . busPIRone (BUSPAR) 5 MG tablet Take 1 tablet (5 mg total) by mouth 2 (two) times daily. (Patient not taking: Reported on 01/20/2018) 30 tablet 1  . folic acid (FOLVITE) 1 MG tablet Take 1 tablet (1 mg total) by mouth daily. 30 tablet 11  . hydroxyurea (HYDREA) 500 MG capsule Take 3 capsules (1,500 mg total) by mouth daily. May take with food to minimize GI side effects. 270 capsule 3  . ibuprofen (ADVIL,MOTRIN) 800 MG tablet Take 1 tablet (800 mg total) by mouth every 8  (eight) hours as needed for mild pain. 90 tablet 0  . Multiple Vitamin (MULTIVITAMIN) tablet Take 2 tablets by mouth daily.     . naloxegol oxalate (MOVANTIK) 25 MG TABS tablet Take 1 tablet (25 mg total) by mouth daily. 30 tablet 2  . Oxycodone HCl 20 MG TABS Take 1 tablet (20 mg total) by mouth every 6 (six) hours as needed for up to 15 days. 60 tablet 0  . REXULTI 1 MG TABS     . triamcinolone (KENALOG) 0.025 % ointment Apply 1 application topically 2 (two) times daily. 30 g 2  . gabapentin (NEURONTIN) 600 MG tablet TAKE 1 TABLET(600 MG) BY MOUTH TWICE DAILY 60 tablet 3  . traZODone (DESYREL) 100 MG tablet Take 1 tablet (100 mg total) by mouth at bedtime. 30 tablet 3   No facility-administered medications prior to visit.    Allergies  Allergen Reactions  . Amoxicillin Diarrhea    Did it involve swelling of the face/tongue/throat, SOB, or low BP? No Did it involve sudden or severe rash/hives, skin peeling, or any reaction on the inside of your mouth or nose? No Did you need to seek medical attention at a hospital or doctor's office? No When did it last happen?childhood If all above answers are "NO", may proceed with cephalosporin use.     ROS Review of Systems  Constitutional: Negative.   HENT: Negative.   Eyes: Negative.   Respiratory: Negative.   Cardiovascular: Negative.   Gastrointestinal: Positive for constipation (occasional ) and nausea (occasional ).  Endocrine: Negative.   Genitourinary: Negative.   Musculoskeletal: Negative.   Skin: Negative.   Allergic/Immunologic: Negative.   Neurological: Positive for dizziness (occasional) and headaches (occasional ).  Hematological: Negative.   Psychiatric/Behavioral: Negative.       Objective:    Physical Exam  Constitutional: He is oriented to person, place, and time. He appears well-developed and well-nourished.  HENT:  Head: Atraumatic.  Eyes: Conjunctivae are normal.  Cardiovascular: Normal rate, regular  rhythm, normal heart sounds and intact distal pulses.  Pulmonary/Chest: Effort normal and breath sounds normal.  Abdominal: Soft. Bowel sounds are normal.  Musculoskeletal:        General: Normal range of motion.     Cervical back: Normal range of motion and neck supple.  Neurological: He is alert and oriented to person, place, and time. He has normal reflexes.  Skin: Skin is warm.  Psychiatric: He has a normal mood and affect. His behavior is normal. Judgment and thought content normal.  Nursing note and vitals reviewed.  BP 127/74   Pulse 98   Temp 98.1 F (36.7 C) (Oral)   Ht 5\' 8"  (1.727 m)   Wt 196 lb (88.9 kg)   SpO2 98%   BMI 29.80 kg/m  Wt Readings from Last 3 Encounters:  09/19/19 196 lb (88.9 kg)  06/21/19 190 lb 12.8 oz (86.5 kg)  03/22/19 188 lb 3.2 oz (85.4 kg)     There are no preventive care reminders to display for this patient.  There are no preventive care reminders to display for this patient.  Lab Results  Component Value Date   TSH 0.78 09/11/2016   Lab Results  Component Value Date   WBC 19.8 (H) 12/19/2018   HGB 11.0 (L) 12/19/2018   HCT 32.2 (L) 12/19/2018   MCV 96 12/19/2018   PLT 610 (H) 12/19/2018   Lab Results  Component Value Date   NA 140 12/19/2018   K 4.8 12/19/2018   CO2 23 12/19/2018   GLUCOSE 82 12/19/2018   BUN 11 12/19/2018   CREATININE 0.79 12/19/2018   BILITOT 1.1 12/19/2018   ALKPHOS 62 12/19/2018   AST 28 12/19/2018   ALT 18 12/19/2018   PROT 7.1 12/19/2018   ALBUMIN 4.5 12/19/2018   CALCIUM 9.4 12/19/2018   ANIONGAP 9 08/01/2018   No results found for: CHOL No results found for: HDL No results found for: LDLCALC No results found for: TRIG No results found for: [facility] Lab Results  Component Value Date   HGBA1C <4.0 09/07/2013      Assessment & Plan:   1. Hb-SS disease without crisis [facility]) He is doing well today r/t his chronic pain management. He will continue to take pain medications as prescribed;  will continue to avoid extreme heat and cold; will continue to eat a healthy diet and drink at least 64 ounces of water daily; continue stool softener as needed; will avoid colds and flu; will continue to get plenty of sleep and rest; will continue to avoid high stressful situations and remain infection free; will continue Folic Acid 1 mg daily to avoid sickle cell crisis. Continue to follow up with Hematologist as needed.  - POCT urinalysis dipstick - gabapentin (NEURONTIN) 600 MG tablet; Take 1 capsule, by mouth, 2 times a day.  Dispense: 60 tablet; Refill: 6  2. Chronic pain syndrome - gabapentin (NEURONTIN) 600 MG tablet; Take 1 capsule, by mouth, 2 times a day.  Dispense: 60 tablet; Refill: 6  3. Chronic prescription opiate use  4. Constipation due to opioid therapy  5. Insomnia, unspecified type We will increase Trazodone to 150 mg today.  - traZODone (DESYREL) 150 MG tablet; Take 1 tablet (150 mg total) by mouth at bedtime.  Dispense: 30 tablet; Refill: 6  6. Follow up He will follow up in 3 months.   Meds ordered this encounter  Medications  . traZODone (DESYREL) 150 MG tablet    Sig: Take 1 tablet (150 mg total) by mouth at bedtime.    Dispense:  30 tablet    Refill:  6  . gabapentin (NEURONTIN) 600 MG tablet    Sig: Take 1 capsule, by mouth, 2 times a day.    Dispense:  60 tablet    Refill:  6    Orders Placed This Encounter  Procedures  . POCT urinalysis dipstick    Referral Orders  No referral(s) requested today    Kathe Becton,  MSN, FNP-BC [address]  [place], [place] [address] [phone] fax   Problem List Items Addressed This Visit      Other   Chronic prescription opiate use   Hb-SS disease without crisis ([place]) - Primary   Relevant Medications   gabapentin (NEURONTIN) 600 MG tablet   Other Relevant Orders   POCT urinalysis dipstick (Completed)    Insomnia   Relevant Medications   traZODone (DESYREL) 150 MG tablet    Other Visit Diagnoses    Chronic pain syndrome       Relevant Medications   traZODone (DESYREL) 150 MG tablet   gabapentin (NEURONTIN) 600 MG tablet   Constipation due to opioid therapy       Follow up          Meds ordered this encounter  Medications  . traZODone (DESYREL) 150 MG tablet    Sig: Take 1 tablet (150 mg total) by mouth at bedtime.    Dispense:  30 tablet    Refill:  6  . gabapentin (NEURONTIN) 600 MG tablet    Sig: Take 1 capsule, by mouth, 2 times a day.    Dispense:  60 tablet    Refill:  6    Follow-up: Return in about 3 months (around 12/20/2019).    Azzie Glatter, FNP

## 2019-09-28 ENCOUNTER — Telehealth: Payer: Self-pay | Admitting: Nurse Practitioner

## 2019-09-28 ENCOUNTER — Other Ambulatory Visit: Payer: Self-pay | Admitting: Nurse Practitioner

## 2019-09-28 DIAGNOSIS — D571 Sickle-cell disease without crisis: Secondary | ICD-10-CM

## 2019-09-28 DIAGNOSIS — Z79891 Long term (current) use of opiate analgesic: Secondary | ICD-10-CM

## 2019-09-28 MED ORDER — OXYCODONE HCL 20 MG PO TABS: 1.0000 | ORAL_TABLET | Freq: Four times a day (QID) | ORAL | 0 refills | Status: DC | PRN

## 2019-10-04 NOTE — Telephone Encounter (Signed)
done

## 2019-10-16 ENCOUNTER — Other Ambulatory Visit: Payer: Self-pay | Admitting: Nurse Practitioner

## 2019-10-16 ENCOUNTER — Telehealth: Payer: Self-pay | Admitting: Nurse Practitioner

## 2019-10-16 DIAGNOSIS — Z79891 Long term (current) use of opiate analgesic: Secondary | ICD-10-CM

## 2019-10-16 DIAGNOSIS — D571 Sickle-cell disease without crisis: Secondary | ICD-10-CM

## 2019-10-16 MED ORDER — OXYCODONE HCL 20 MG PO TABS: 1.0000 | ORAL_TABLET | Freq: Four times a day (QID) | ORAL | 0 refills | Status: DC | PRN

## 2019-10-16 NOTE — Telephone Encounter (Signed)
sent 

## 2019-10-30 ENCOUNTER — Telehealth: Payer: Self-pay | Admitting: Nurse Practitioner

## 2019-11-01 ENCOUNTER — Other Ambulatory Visit: Payer: Self-pay | Admitting: Nurse Practitioner

## 2019-11-01 ENCOUNTER — Telehealth: Payer: Self-pay | Admitting: Nurse Practitioner

## 2019-11-01 DIAGNOSIS — Z79891 Long term (current) use of opiate analgesic: Secondary | ICD-10-CM

## 2019-11-01 DIAGNOSIS — D571 Sickle-cell disease without crisis: Secondary | ICD-10-CM

## 2019-11-01 MED ORDER — OXYCODONE HCL 20 MG PO TABS: 1.0000 | ORAL_TABLET | Freq: Four times a day (QID) | ORAL | 0 refills | Status: DC | PRN

## 2019-11-01 NOTE — Telephone Encounter (Signed)
Done

## 2019-11-01 NOTE — Telephone Encounter (Signed)
Sent!

## 2019-11-15 ENCOUNTER — Other Ambulatory Visit: Payer: Self-pay | Admitting: Nurse Practitioner

## 2019-11-15 ENCOUNTER — Telehealth: Payer: Self-pay | Admitting: Family Medicine

## 2019-11-15 DIAGNOSIS — Z79891 Long term (current) use of opiate analgesic: Secondary | ICD-10-CM

## 2019-11-15 DIAGNOSIS — D571 Sickle-cell disease without crisis: Secondary | ICD-10-CM

## 2019-11-15 MED ORDER — OXYCODONE HCL 20 MG PO TABS: 1.0000 | ORAL_TABLET | Freq: Four times a day (QID) | ORAL | 0 refills | Status: DC | PRN

## 2019-11-15 NOTE — Progress Notes (Signed)
Indication for chronic opioid: Sickle cell disease Medication and dose: Oxycodone 20 mg # pills per month: 120 Last UDS date: 06/06/2019 Opioid Treatment Agreement signed (Y/N): Yes Opioid Treatment Agreement last reviewed with patient:  Unknown.  To be signed at next visit [place] reviewed this encounter (include red flags): Yes

## 2019-11-15 NOTE — Telephone Encounter (Signed)
May refill on 11/17/2019. Refill sent.

## 2019-11-30 ENCOUNTER — Telehealth: Payer: Self-pay | Admitting: Nurse Practitioner

## 2019-11-30 ENCOUNTER — Other Ambulatory Visit: Payer: Self-pay | Admitting: Nurse Practitioner

## 2019-11-30 DIAGNOSIS — D571 Sickle-cell disease without crisis: Secondary | ICD-10-CM

## 2019-11-30 DIAGNOSIS — Z79891 Long term (current) use of opiate analgesic: Secondary | ICD-10-CM

## 2019-11-30 MED ORDER — OXYCODONE HCL 20 MG PO TABS: 1.0000 | ORAL_TABLET | Freq: Four times a day (QID) | ORAL | 0 refills | Status: DC | PRN

## 2019-11-30 NOTE — Telephone Encounter (Signed)
Pt called in medication refill for oxycodone 20mg 

## 2019-11-30 NOTE — Telephone Encounter (Signed)
Sent!

## 2019-12-14 ENCOUNTER — Other Ambulatory Visit: Payer: Self-pay | Admitting: Nurse Practitioner

## 2019-12-14 ENCOUNTER — Telehealth: Payer: Self-pay | Admitting: Nurse Practitioner

## 2019-12-14 DIAGNOSIS — Z79891 Long term (current) use of opiate analgesic: Secondary | ICD-10-CM

## 2019-12-14 DIAGNOSIS — D571 Sickle-cell disease without crisis: Secondary | ICD-10-CM

## 2019-12-14 MED ORDER — OXYCODONE HCL 20 MG PO TABS: 1.0000 | ORAL_TABLET | Freq: Four times a day (QID) | ORAL | 0 refills | Status: DC | PRN

## 2019-12-14 NOTE — Telephone Encounter (Signed)
Pt called in oxycodone 20mg 

## 2019-12-14 NOTE — Telephone Encounter (Signed)
Refill sent for 12/17/2019

## 2019-12-27 ENCOUNTER — Ambulatory Visit (INDEPENDENT_AMBULATORY_CARE_PROVIDER_SITE_OTHER): Payer: Medicaid Other | Admitting: Nurse Practitioner

## 2019-12-27 ENCOUNTER — Other Ambulatory Visit: Payer: Self-pay

## 2019-12-27 ENCOUNTER — Encounter: Payer: Self-pay | Admitting: Nurse Practitioner

## 2019-12-27 VITALS — BP 110/62 | HR 66 | Temp 97.7°F | Ht 68.0 in | Wt 192.0 lb

## 2019-12-27 DIAGNOSIS — D571 Sickle-cell disease without crisis: Secondary | ICD-10-CM

## 2019-12-27 DIAGNOSIS — Z79891 Long term (current) use of opiate analgesic: Secondary | ICD-10-CM

## 2019-12-27 DIAGNOSIS — G894 Chronic pain syndrome: Secondary | ICD-10-CM

## 2019-12-27 DIAGNOSIS — F419 Anxiety disorder, unspecified: Secondary | ICD-10-CM | POA: Diagnosis not present

## 2019-12-27 DIAGNOSIS — R600 Localized edema: Secondary | ICD-10-CM | POA: Diagnosis not present

## 2019-12-27 DIAGNOSIS — G4700 Insomnia, unspecified: Secondary | ICD-10-CM

## 2019-12-27 LAB — POCT URINALYSIS DIPSTICK
Bilirubin, UA: NEGATIVE
Blood, UA: NEGATIVE
Glucose, UA: NEGATIVE
Leukocytes, UA: NEGATIVE
Nitrite, UA: NEGATIVE
Protein, UA: NEGATIVE
Spec Grav, UA: 1.015 (ref 1.010–1.025)
Urobilinogen, UA: 0.2 E.U./dL
pH, UA: 7.5 (ref 5.0–8.0)

## 2019-12-27 MED ORDER — HYDROXYUREA 500 MG PO CAPS: 1500.0000 mg | ORAL_CAPSULE | Freq: Every day | ORAL | 3 refills | Status: DC

## 2019-12-27 MED ORDER — GABAPENTIN 600 MG PO TABS: ORAL_TABLET | ORAL | 6 refills | Status: DC

## 2019-12-27 MED ORDER — TRAZODONE HCL 150 MG PO TABS: 150.0000 mg | ORAL_TABLET | Freq: Every day | ORAL | 6 refills | Status: DC

## 2019-12-27 MED ORDER — OXYCODONE HCL 20 MG PO TABS: 1.0000 | ORAL_TABLET | Freq: Four times a day (QID) | ORAL | 0 refills | Status: DC | PRN

## 2019-12-27 NOTE — Progress Notes (Signed)
[place], [place]  [address] Phone:  [phone]   Fax:  [phone]   Established Patient Office Visit  Subjective:  Patient ID: Alejandro Soto, male    DOB: 1986-02-05  Age: 34 y.o. MRN: [id number]  CC:  Chief Complaint  Patient presents with  . Follow-up    no concerns    HPI Alejandro Soto presents for follow-up.  He  has a past medical history of Insomnia (08/2019), Pneumonia, and Sickle cell anemia ([place]).  He is seen today for follow-up for his sickle cell.  He has 5 out of 10 aching pain in his lower back and legs.  He admits that this is a part of his normal pain areas.  He admits that he had ankle swelling on the left. he denies any injury.  He admits that he slept sitting up while wearing flip-flops for several nights.  He denies any ankle pain or discomfort.  Denies fever, headache, cough, wheezing, shortness of breath, chest pains, abdominal pain or hip pain. Denies any open wounds, skin irritation. He is suppose to wear glasses.  Anxiety Patient is here for evaluation of anxiety.  He has the following anxiety symptoms: difficulty concentrating, fatigue, insomnia. Onset of symptoms was approximately several months ago.  Symptoms have been gradually worsening since that time. He denies current suicidal and homicidal ideation.Possible organic causes contributing are: endocrine/metabolic. Risk factors: negative life event Disability case Previous treatment includes medication BuSpar and Trazodone.   He complains of the following medication side effects: none.    Past Medical History:  Diagnosis Date  . Insomnia 08/2019  . Pneumonia   . Sickle cell anemia (HCC)     Past Surgical History:  Procedure Laterality Date  . NO PAST SURGERIES      Family History  Problem Relation Age of Onset  . Cancer Maternal Grandmother        colon   . Diabetes Father   . Cancer Father 46       Prostate  . Asthma Brother     Social  History   Socioeconomic History  . Marital status: Single    Spouse name: Not on file  . Number of children: Not on file  . Years of education: Not on file  . Highest education level: Not on file  Occupational History  . Not on file  Tobacco Use  . Smoking status: Current Some Day Smoker    Packs/day: 0.25    Years: 5.00    Pack years: 1.25    Types: Cigarettes  . Smokeless tobacco: Never Used  Vaping Use  . Vaping Use: Never used  Substance and Sexual Activity  . Alcohol use: No    Alcohol/week: 0.0 standard drinks  . Drug use: No  . Sexual activity: Yes    Birth control/protection: Condom  Other Topics Concern  . Not on file  Social History Narrative  . Not on file   Social Determinants of Health   Financial Resource Strain:   . Difficulty of Paying Living Expenses:   Food Insecurity:   . Worried About Charity fundraiser in the Last Year:   . Arboriculturist in the Last Year:   Transportation Needs:   . Film/video editor (Medical):   Marland Kitchen Lack of Transportation (Non-Medical):   Physical Activity:   . Days of Exercise per Week:   . Minutes of Exercise per Session:   Stress:   .  Feeling of Stress :   Social Connections:   . Frequency of Communication with Friends and Family:   . Frequency of Social Gatherings with Friends and Family:   . Attends Religious Services:   . Active Member of Clubs or Organizations:   . Attends Archivist Meetings:   Marland Kitchen Marital Status:   Intimate Partner Violence:   . Fear of Current or Ex-Partner:   . Emotionally Abused:   Marland Kitchen Physically Abused:   . Sexually Abused:     Outpatient Medications Prior to Visit  Medication Sig Dispense Refill  . folic acid (FOLVITE) 1 MG tablet Take 1 tablet (1 mg total) by mouth daily. 30 tablet 11  . ibuprofen (ADVIL,MOTRIN) 800 MG tablet Take 1 tablet (800 mg total) by mouth every 8 (eight) hours as needed for mild pain. 90 tablet 0  . Multiple Vitamin (MULTIVITAMIN) tablet Take 2  tablets by mouth daily.     . naloxegol oxalate (MOVANTIK) 25 MG TABS tablet Take 1 tablet (25 mg total) by mouth daily. 30 tablet 2  . triamcinolone (KENALOG) 0.025 % ointment Apply 1 application topically 2 (two) times daily. 30 g 2  . gabapentin (NEURONTIN) 600 MG tablet Take 1 capsule, by mouth, 2 times a day. 60 tablet 6  . hydroxyurea (HYDREA) 500 MG capsule Take 3 capsules (1,500 mg total) by mouth daily. May take with food to minimize GI side effects. 270 capsule 3  . Oxycodone HCl 20 MG TABS Take 1 tablet (20 mg total) by mouth every 6 (six) hours as needed for up to 15 days. 60 tablet 0  . traZODone (DESYREL) 150 MG tablet Take 1 tablet (150 mg total) by mouth at bedtime. 30 tablet 6  . busPIRone (BUSPAR) 5 MG tablet Take 1 tablet (5 mg total) by mouth 2 (two) times daily. (Patient not taking: Reported on 01/20/2018) 30 tablet 1  . REXULTI 1 MG TABS  (Patient not taking: Reported on 12/27/2019)     No facility-administered medications prior to visit.    Allergies  Allergen Reactions  . Amoxicillin Diarrhea    Did it involve swelling of the face/tongue/throat, SOB, or low BP? No Did it involve sudden or severe rash/hives, skin peeling, or any reaction on the inside of your mouth or nose? No Did you need to seek medical attention at a hospital or doctor's office? No When did it last happen?childhood If all above answers are "NO", may proceed with cephalosporin use.     ROS Review of Systems  All other systems reviewed and are negative.     Objective:    Physical Exam Vitals reviewed.  Constitutional:      General: He is not in acute distress.    Appearance: He is not ill-appearing or toxic-appearing.  HENT:     Head: Normocephalic and atraumatic.     Nose: Nose normal.     Mouth/Throat:     Mouth: Mucous membranes are moist.     Pharynx: Oropharynx is clear.  Cardiovascular:     Rate and Rhythm: Normal rate and regular rhythm.     Pulses: Normal pulses.      Heart sounds: Normal heart sounds.  Pulmonary:     Effort: Pulmonary effort is normal.     Breath sounds: Normal breath sounds.  Abdominal:     General: Bowel sounds are normal.     Palpations: Abdomen is soft.  Musculoskeletal:        General: Normal range of motion.  Cervical back: Normal range of motion.     Comments: 1+ pitting edema bilateral lower extremities non tender Homans' sign negative  Skin:    General: Skin is warm and dry.  Neurological:     General: No focal deficit present.     Mental Status: He is alert and oriented to person, place, and time.  Psychiatric:        Mood and Affect: Mood normal.        Behavior: Behavior normal.        Thought Content: Thought content normal.        Judgment: Judgment normal.     BP 110/62   Pulse 66   Temp 97.7 F (36.5 C)   Ht 5\' 8"  (1.727 m)   Wt 192 lb 0.2 oz (87.1 kg)   SpO2 100%   BMI 29.20 kg/m  Wt Readings from Last 3 Encounters:  12/27/19 192 lb 0.2 oz (87.1 kg)  09/19/19 196 lb (88.9 kg)  06/21/19 190 lb 12.8 oz (86.5 kg)     There are no preventive care reminders to display for this patient.  There are no preventive care reminders to display for this patient.  Lab Results  Component Value Date   TSH 0.78 09/11/2016   Lab Results  Component Value Date   WBC 11.8 (H) 12/27/2019   HGB 10.4 (L) 12/27/2019   HCT 31.6 (L) 12/27/2019   MCV 97 12/27/2019   PLT 731 (H) 12/27/2019   Lab Results  Component Value Date   NA 138 12/27/2019   K 4.8 12/27/2019   CO2 25 12/27/2019   GLUCOSE 82 12/27/2019   BUN 7 12/27/2019   CREATININE 0.71 (L) 12/27/2019   BILITOT 0.9 12/27/2019   ALKPHOS 69 12/27/2019   AST 24 12/27/2019   ALT 17 12/27/2019   PROT 7.4 12/27/2019   ALBUMIN 4.6 12/27/2019   CALCIUM 9.8 12/27/2019   ANIONGAP 9 08/01/2018   No results found for: CHOL No results found for: HDL No results found for: LDLCALC No results found for: TRIG No results found for: CHOLHDL Lab Results    Component Value Date   HGBA1C <4.0 09/07/2013      Assessment & Plan:   Problem List Items Addressed This Visit      Other   Chronic prescription opiate use   Relevant Medications   Oxycodone HCl 20 MG TABS (Start on 01/01/2020)   Insomnia   Relevant Medications   traZODone (DESYREL) 150 MG tablet   Hb-SS 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  Declined local pharmacy vaccination    Other Visit Diagnoses    Chronic pain syndrome       Relevant Medications   gabapentin (NEURONTIN) 600 MG tablet   Oxycodone HCl 20 MG TABS (Start on 01/01/2020)   traZODone (DESYREL) 150 MG tablet   Other Relevant Orders   [id number] 11+Oxyco+Alc+Crt-Bund (Completed)   Anxiety       Relevant Medications   traZODone (DESYREL) 150 MG tablet Buspar Continue with current regimen and behavior modifying techniques.   Lower extremity edema Encourage  patient to avoid lower legs to be dependent for several hours.  Encourage compression socks and elevation      Meds ordered this encounter  Medications  . gabapentin (NEURONTIN) 600 MG tablet    Sig: Take 1 capsule, by mouth, 2 times a day.    Dispense:  60 tablet    Refill:  6    Order Specific Question:   Supervising Provider    Answer:   Tresa Garter [id number]  . hydroxyurea (HYDREA) 500 MG capsule    Sig: Take 3 capsules (1,500 mg total) by mouth daily. May take with food to minimize GI side effects.    Dispense:  270 capsule    Refill:  3    Order Specific Question:   Supervising Provider    Answer:   Tresa Garter [id number]  .  Oxycodone HCl 20 MG TABS    Sig: Take 1 tablet (20 mg total) by mouth every 6 (six) hours as needed for up to 15 days.    Dispense:  60 tablet    Refill:  0    May fill on 01/01/2020.  Thanks    Order Specific Question:   Supervising Provider    Answer:   Tresa Garter [id number]  . traZODone (DESYREL) 150 MG tablet    Sig: Take 1 tablet (150 mg total) by mouth at bedtime.    Dispense:  30 tablet    Refill:  6    Order Specific Question:   Supervising Provider    Answer:   Tresa Garter [[id number]]    Follow-up: Return in about 3 months (around 03/28/2020).    Vevelyn Francois, NP

## 2019-12-27 NOTE — Patient Instructions (Signed)
Edema  Edema is when you have too much fluid in your body or under your skin. Edema may make your legs, feet, and ankles swell up. Swelling is also common in looser tissues, like around your eyes. This is a common condition. It gets more common as you get older. There are many possible causes of edema. Eating too much salt (sodium) and being on your feet or sitting for a long time can cause edema in your legs, feet, and ankles. Hot weather may make edema worse. Edema is usually painless. Your skin may look swollen or shiny. Follow these instructions at home:  Keep the swollen body part raised (elevated) above the level of your heart when you are sitting or lying down.  Do not sit still or stand for a long time.  Do not wear tight clothes. Do not wear garters on your upper legs.  Exercise your legs. This can help the swelling go down.  Wear elastic bandages or support stockings as told by your doctor.  Eat a low-salt (low-sodium) diet to reduce fluid as told by your doctor.  Depending on the cause of your swelling, you may need to limit how much fluid you drink (fluid restriction).  Take over-the-counter and prescription medicines only as told by your doctor. Contact a doctor if:  Treatment is not working.  You have heart, liver, or kidney disease and have symptoms of edema.  You have sudden and unexplained weight gain. Get help right away if:  You have shortness of breath or chest pain.  You cannot breathe when you lie down.  You have pain, redness, or warmth in the swollen areas.  You have heart, liver, or kidney disease and get edema all of a sudden.  You have a fever and your symptoms get worse all of a sudden. Summary  Edema is when you have too much fluid in your body or under your skin.  Edema may make your legs, feet, and ankles swell up. Swelling is also common in looser tissues, like around your eyes.  Raise (elevate) the swollen body part above the level of your  heart when you are sitting or lying down.  Follow your doctor's instructions about diet and how much fluid you can drink (fluid restriction). This information is not intended to replace advice given to you by your health care provider. Make sure you discuss any questions you have with your health care provider. Document Revised: 06/11/2017 Document Reviewed: 06/26/2016 Elsevier Patient Education  2020 [employer]  

## 2019-12-28 LAB — CMP14+CBC/D/PLT+FER+RETIC+V...
ALT: 17 IU/L (ref 0–44)
AST: 24 IU/L (ref 0–40)
Albumin/Globulin Ratio: 1.6 (ref 1.2–2.2)
Albumin: 4.6 g/dL (ref 4.0–5.0)
Alkaline Phosphatase: 69 IU/L (ref 48–121)
BUN/Creatinine Ratio: 10 (ref 9–20)
BUN: 7 mg/dL (ref 6–20)
Basophils Absolute: 0.1 10*3/uL (ref 0.0–0.2)
Basos: 1 %
Bilirubin Total: 0.9 mg/dL (ref 0.0–1.2)
CO2: 25 mmol/L (ref 20–29)
Calcium: 9.8 mg/dL (ref 8.7–10.2)
Chloride: 102 mmol/L (ref 96–106)
Creatinine, Ser: 0.71 mg/dL — ABNORMAL LOW (ref 0.76–1.27)
EOS (ABSOLUTE): 0.2 10*3/uL (ref 0.0–0.4)
Eos: 2 %
Ferritin: 550 ng/mL — ABNORMAL HIGH (ref 30–400)
GFR calc Af Amer: 143 mL/min/{1.73_m2} (ref >59)
GFR calc non Af Amer: 123 mL/min/{1.73_m2} (ref >59)
Globulin, Total: 2.8 g/dL (ref 1.5–4.5)
Glucose: 82 mg/dL (ref 65–99)
Hematocrit: 31.6 % — ABNORMAL LOW (ref 37.5–51.0)
Hemoglobin: 10.4 g/dL — ABNORMAL LOW (ref 13.0–17.7)
Immature Grans (Abs): 0.1 10*3/uL (ref 0.0–0.1)
Immature Granulocytes: 1 %
Lymphocytes Absolute: 3.7 10*3/uL — ABNORMAL HIGH (ref 0.7–3.1)
Lymphs: 31 %
MCH: 32 pg (ref 26.6–33.0)
MCHC: 32.9 g/dL (ref 31.5–35.7)
MCV: 97 fL (ref 79–97)
Monocytes Absolute: 0.8 10*3/uL (ref 0.1–0.9)
Monocytes: 7 %
NRBC: 4 % — ABNORMAL HIGH (ref 0–0)
Neutrophils Absolute: 6.9 10*3/uL (ref 1.4–7.0)
Neutrophils: 58 %
Platelets: 731 10*3/uL — ABNORMAL HIGH (ref 150–450)
Potassium: 4.8 mmol/L (ref 3.5–5.2)
RBC: 3.25 x10E6/uL — ABNORMAL LOW (ref 4.14–5.80)
RDW: 17 % — ABNORMAL HIGH (ref 11.6–15.4)
Retic Ct Pct: 6.2 % — ABNORMAL HIGH (ref 0.6–2.6)
Sodium: 138 mmol/L (ref 134–144)
Total Protein: 7.4 g/dL (ref 6.0–8.5)
Vit D, 25-Hydroxy: 22.8 ng/mL — ABNORMAL LOW (ref 30.0–100.0)
WBC: 11.8 10*3/uL — ABNORMAL HIGH (ref 3.4–10.8)

## 2019-12-28 NOTE — Progress Notes (Signed)
Overall labs are stable  No change in treatment

## 2020-01-03 LAB — DRUG SCREEN 764883 11+OXYCO+ALC+CRT-BUND
Amphetamines, Urine: NEGATIVE ng/mL
BENZODIAZ UR QL: NEGATIVE ng/mL
Barbiturate: NEGATIVE ng/mL
Cannabinoid Quant, Ur: NEGATIVE ng/mL
Cocaine (Metabolite): NEGATIVE ng/mL
Creatinine: 102.8 mg/dL (ref 20.0–300.0)
Ethanol: NEGATIVE %
Meperidine: NEGATIVE ng/mL
Methadone Screen, Urine: NEGATIVE ng/mL
Phencyclidine: NEGATIVE ng/mL
Propoxyphene: NEGATIVE ng/mL
Tramadol: NEGATIVE ng/mL
pH, Urine: 8 (ref 4.5–8.9)

## 2020-01-03 LAB — OXYCODONE/OXYMORPHONE, CONFIRM
OXYCODONE/OXYMORPH: POSITIVE — AB
OXYCODONE: 1549 ng/mL
OXYCODONE: POSITIVE — AB
OXYMORPHONE (GC/MS): 2343 ng/mL
OXYMORPHONE: POSITIVE — AB

## 2020-01-03 LAB — OPIATES CONFIRMATION, URINE: Opiates: NEGATIVE ng/mL

## 2020-01-12 ENCOUNTER — Telehealth: Payer: Self-pay | Admitting: Nurse Practitioner

## 2020-01-12 NOTE — Telephone Encounter (Signed)
Refill in 4 days resend this on Monday

## 2020-01-15 ENCOUNTER — Other Ambulatory Visit: Payer: Self-pay | Admitting: Nurse Practitioner

## 2020-01-15 DIAGNOSIS — Z79891 Long term (current) use of opiate analgesic: Secondary | ICD-10-CM

## 2020-01-15 DIAGNOSIS — D571 Sickle-cell disease without crisis: Secondary | ICD-10-CM

## 2020-01-15 MED ORDER — OXYCODONE HCL 20 MG PO TABS: 1.0000 | ORAL_TABLET | Freq: Four times a day (QID) | ORAL | 0 refills | Status: DC | PRN

## 2020-01-15 NOTE — Telephone Encounter (Signed)
Sent. thanks

## 2020-01-15 NOTE — Progress Notes (Signed)
° °  [place], [place]  [address] Phone:  [phone]   Fax:  [phone] Subjective:    Alejandro Soto calls for refill of his oxycodone. He is satisfied with the degree of analgesic relief.    Objective:   He has sickle cell disease type SS.  He is currently taking oxycodone 20 mg twice daily.  His last drug screen 2 weeks ago indicated that he is using his medications as directed without any undisclosed or illegal substances.   Assessment:    analgesic for chronic pain   Plan:  Will refill oxycodone, refill date 01/16/2020   Return for follow-up appointment as scheduled.

## 2020-01-17 ENCOUNTER — Telehealth: Payer: Self-pay

## 2020-01-17 NOTE — Telephone Encounter (Signed)
Faxed a referral over to [facility] today 01/17/2020. Thanks!

## 2020-01-29 ENCOUNTER — Telehealth: Payer: Self-pay | Admitting: Nurse Practitioner

## 2020-01-31 ENCOUNTER — Telehealth: Payer: Self-pay

## 2020-01-31 ENCOUNTER — Other Ambulatory Visit: Payer: Self-pay | Admitting: Family Medicine

## 2020-01-31 DIAGNOSIS — D571 Sickle-cell disease without crisis: Secondary | ICD-10-CM

## 2020-01-31 DIAGNOSIS — Z79891 Long term (current) use of opiate analgesic: Secondary | ICD-10-CM

## 2020-01-31 MED ORDER — OXYCODONE HCL 20 MG PO TABS: 1.0000 | ORAL_TABLET | Freq: Four times a day (QID) | ORAL | 0 refills | Status: DC | PRN

## 2020-01-31 NOTE — Progress Notes (Unsigned)
Reviewed PDMP substance reporting system prior to prescribing opiate medications. No inconsistencies noted.  °Meds ordered this encounter  °Medications  ° Oxycodone HCl 20 MG TABS  °  Sig: Take 1 tablet (20 mg total) by mouth every 6 (six) hours as needed for up to 15 days.  °  Dispense:  60 tablet  °  Refill:  0  °  Order Specific Question:   Supervising Provider  °  Answer:   JEGEDE, OLUGBEMIGA E [[id number]]  ° Axel Meas Moore Liah Morr  APRN, MSN, FNP-C °Patient Care Center °[facility] °[address]  °[place], [place] [address] [phone] ° °

## 2020-01-31 NOTE — Telephone Encounter (Signed)
Refill request for oxycodone. Please advise.  

## 2020-01-31 NOTE — Telephone Encounter (Signed)
Done

## 2020-02-13 ENCOUNTER — Telehealth: Payer: Self-pay | Admitting: Nurse Practitioner

## 2020-02-15 ENCOUNTER — Telehealth: Payer: Self-pay | Admitting: Nurse Practitioner

## 2020-02-15 ENCOUNTER — Other Ambulatory Visit: Payer: Self-pay | Admitting: Nurse Practitioner

## 2020-02-15 DIAGNOSIS — D571 Sickle-cell disease without crisis: Secondary | ICD-10-CM

## 2020-02-15 DIAGNOSIS — Z79891 Long term (current) use of opiate analgesic: Secondary | ICD-10-CM

## 2020-02-15 MED ORDER — OXYCODONE HCL 20 MG PO TABS: 1.0000 | ORAL_TABLET | Freq: Four times a day (QID) | ORAL | 0 refills | Status: DC | PRN

## 2020-02-15 NOTE — Telephone Encounter (Signed)
This is why is important that we always ask the patient will be taking messages which pharmacy.

## 2020-02-15 NOTE — Telephone Encounter (Signed)
Med refill oxyc 20 mg. meds end today

## 2020-02-15 NOTE — Telephone Encounter (Signed)
Refill sent.

## 2020-02-15 NOTE — Progress Notes (Signed)
° °  [place], [place]  [address] Phone:  [phone]   Fax:  [phone] PDMP not reviewed this encounter.Subjective:    Alejandro Soto calls for refill of his oxycodone 20 mg.    Objective:   Indication for chronic opioid: Chronic pain syndrome related to sickle cell disease Medication and dose: Oxycodone 20 mg # pills per month: 120 Last UDS date: 12/27/2019 Opioid Treatment Agreement signed (Y/N): Yes Opioid Treatment Agreement last reviewed with patient:  Yes NCCSRS reviewed this encounter (include red flags):  No   Assessment:   Chronic Pain Syndrome related to Sickle Cell Disease    Plan:  Oxycodone 20 mg refilled  Return for follow-up appointment as scheduled.

## 2020-02-15 NOTE — Telephone Encounter (Signed)
[facility] [address], [place]. Do not send them to [facility] anymore.

## 2020-02-23 ENCOUNTER — Telehealth: Payer: Self-pay | Admitting: Nurse Practitioner

## 2020-02-23 NOTE — Telephone Encounter (Signed)
Do you have time to contact him I do encourage him to consider being vaccinated. Please let me know if he has any questions Thanks

## 2020-02-23 NOTE — Telephone Encounter (Signed)
Sent to NP 

## 2020-02-27 NOTE — Telephone Encounter (Signed)
Called spoke with patient informed of message. He will consider being vaccinated

## 2020-02-29 ENCOUNTER — Telehealth: Payer: Self-pay | Admitting: Nurse Practitioner

## 2020-02-29 ENCOUNTER — Telehealth: Payer: Self-pay

## 2020-02-29 DIAGNOSIS — D571 Sickle-cell disease without crisis: Secondary | ICD-10-CM

## 2020-02-29 DIAGNOSIS — Z79891 Long term (current) use of opiate analgesic: Secondary | ICD-10-CM

## 2020-03-01 ENCOUNTER — Other Ambulatory Visit: Payer: Self-pay | Admitting: Nurse Practitioner

## 2020-03-01 DIAGNOSIS — Z79891 Long term (current) use of opiate analgesic: Secondary | ICD-10-CM

## 2020-03-01 DIAGNOSIS — D571 Sickle-cell disease without crisis: Secondary | ICD-10-CM

## 2020-03-01 MED ORDER — OXYCODONE HCL 20 MG PO TABS: 1.0000 | ORAL_TABLET | Freq: Four times a day (QID) | ORAL | 0 refills | Status: DC | PRN

## 2020-03-01 NOTE — Telephone Encounter (Signed)
Sent to Provider 

## 2020-03-01 NOTE — Progress Notes (Signed)
   [place], [place]  [address] Phone:  [phone]   Fax:  [phone]   PDMP reviewed during this encounter.Subjective:    [name] calls for refill of his oxycodone   Objective:   Indication for chronic opioid: Chronic pain syndrome related to sickle cell disease Medication and dose: Oxycodone 20 mg # pills per month: 120 Last UDS date: 12/27/2019 Opioid Treatment Agreement signed (Y/N): Yes Opioid Treatment Agreement last reviewed with patient:  [address] [place] reviewed this encounter (include red flags): Reviewed with no red flags Yes   Assessment:   Chronic Pain Syndrome related to Sickle Cell Disease    Plan:  [facility]  Return for follow-up appointment as scheduled.

## 2020-03-14 ENCOUNTER — Other Ambulatory Visit: Payer: Self-pay | Admitting: Nurse Practitioner

## 2020-03-14 ENCOUNTER — Telehealth: Payer: Self-pay | Admitting: Nurse Practitioner

## 2020-03-14 DIAGNOSIS — D571 Sickle-cell disease without crisis: Secondary | ICD-10-CM

## 2020-03-14 DIAGNOSIS — Z79891 Long term (current) use of opiate analgesic: Secondary | ICD-10-CM

## 2020-03-14 MED ORDER — OXYCODONE HCL 20 MG PO TABS: 1.0000 | ORAL_TABLET | Freq: Four times a day (QID) | ORAL | 0 refills | Status: DC | PRN

## 2020-03-14 NOTE — Telephone Encounter (Signed)
sent 

## 2020-03-28 ENCOUNTER — Other Ambulatory Visit: Payer: Self-pay

## 2020-03-28 ENCOUNTER — Ambulatory Visit (INDEPENDENT_AMBULATORY_CARE_PROVIDER_SITE_OTHER): Payer: Medicaid Other | Admitting: Nurse Practitioner

## 2020-03-28 ENCOUNTER — Non-Acute Institutional Stay (HOSPITAL_COMMUNITY)
Admission: AD | Admit: 2020-03-28 | Discharge: 2020-03-28 | Disposition: A | Discharge status: Home / Self Care | Payer: Medicaid Other | Source: Ambulatory Visit | Attending: Internal Medicine | Admitting: Internal Medicine

## 2020-03-28 ENCOUNTER — Encounter: Payer: Self-pay | Admitting: Nurse Practitioner

## 2020-03-28 DIAGNOSIS — F1120 Opioid dependence, uncomplicated: Secondary | ICD-10-CM | POA: Diagnosis not present

## 2020-03-28 DIAGNOSIS — M79605 Pain in left leg: Secondary | ICD-10-CM | POA: Diagnosis not present

## 2020-03-28 DIAGNOSIS — D5709 Hb-ss disease with crisis with other specified complication: Secondary | ICD-10-CM | POA: Diagnosis not present

## 2020-03-28 DIAGNOSIS — T402X5A Adverse effect of other opioids, initial encounter: Secondary | ICD-10-CM

## 2020-03-28 DIAGNOSIS — G894 Chronic pain syndrome: Secondary | ICD-10-CM | POA: Insufficient documentation

## 2020-03-28 DIAGNOSIS — K5903 Drug induced constipation: Secondary | ICD-10-CM | POA: Diagnosis not present

## 2020-03-28 DIAGNOSIS — D571 Sickle-cell disease without crisis: Secondary | ICD-10-CM | POA: Diagnosis not present

## 2020-03-28 DIAGNOSIS — Z791 Long term (current) use of non-steroidal anti-inflammatories (NSAID): Secondary | ICD-10-CM | POA: Insufficient documentation

## 2020-03-28 DIAGNOSIS — D5700 Hb-SS disease with crisis, unspecified: Secondary | ICD-10-CM | POA: Diagnosis not present

## 2020-03-28 DIAGNOSIS — M79604 Pain in right leg: Secondary | ICD-10-CM | POA: Insufficient documentation

## 2020-03-28 DIAGNOSIS — Z79899 Other long term (current) drug therapy: Secondary | ICD-10-CM | POA: Diagnosis not present

## 2020-03-28 DIAGNOSIS — Z79891 Long term (current) use of opiate analgesic: Secondary | ICD-10-CM | POA: Diagnosis not present

## 2020-03-28 DIAGNOSIS — D638 Anemia in other chronic diseases classified elsewhere: Secondary | ICD-10-CM | POA: Insufficient documentation

## 2020-03-28 LAB — RETICULOCYTES
Immature Retic Fract: 56.9 % — ABNORMAL HIGH (ref 2.3–15.9)
RBC.: 3.09 MIL/uL — ABNORMAL LOW (ref 4.22–5.81)
Retic Count, Absolute: 343.9 10*3/uL — ABNORMAL HIGH (ref 19.0–186.0)
Retic Ct Pct: 11.1 % — ABNORMAL HIGH (ref 0.4–3.1)

## 2020-03-28 LAB — COMPREHENSIVE METABOLIC PANEL
ALT: 43 U/L (ref 0–44)
AST: 59 U/L — ABNORMAL HIGH (ref 15–41)
Albumin: 4.4 g/dL (ref 3.5–5.0)
Alkaline Phosphatase: 72 U/L (ref 38–126)
Anion gap: 7 (ref 5–15)
BUN: 6 mg/dL (ref 6–20)
CO2: 24 mmol/L (ref 22–32)
Calcium: 8.7 mg/dL — ABNORMAL LOW (ref 8.9–10.3)
Chloride: 105 mmol/L (ref 98–111)
Creatinine, Ser: 0.67 mg/dL (ref 0.61–1.24)
GFR calc non Af Amer: >60 mL/min (ref >60)
Glucose, Bld: 89 mg/dL (ref 70–99)
Potassium: 4.5 mmol/L (ref 3.5–5.1)
Sodium: 136 mmol/L (ref 135–145)
Total Bilirubin: 1.8 mg/dL — ABNORMAL HIGH (ref 0.3–1.2)
Total Protein: 8.3 g/dL — ABNORMAL HIGH (ref 6.5–8.1)

## 2020-03-28 LAB — CBC WITH DIFFERENTIAL/PLATELET
Abs Immature Granulocytes: 0.13 10*3/uL — ABNORMAL HIGH (ref 0.00–0.07)
Basophils Absolute: 0.1 10*3/uL (ref 0.0–0.1)
Basophils Relative: 0 %
Eosinophils Absolute: 0.6 10*3/uL — ABNORMAL HIGH (ref 0.0–0.5)
Eosinophils Relative: 3 %
HCT: 28.8 % — ABNORMAL LOW (ref 39.0–52.0)
Hemoglobin: 9.8 g/dL — ABNORMAL LOW (ref 13.0–17.0)
Immature Granulocytes: 1 %
Lymphocytes Relative: 29 %
Lymphs Abs: 5 10*3/uL — ABNORMAL HIGH (ref 0.7–4.0)
MCH: 31.6 pg (ref 26.0–34.0)
MCHC: 34 g/dL (ref 30.0–36.0)
MCV: 92.9 fL (ref 80.0–100.0)
Monocytes Absolute: 1.9 10*3/uL — ABNORMAL HIGH (ref 0.1–1.0)
Monocytes Relative: 11 %
Neutro Abs: 9.4 10*3/uL — ABNORMAL HIGH (ref 1.7–7.7)
Neutrophils Relative %: 56 %
Platelets: 407 10*3/uL — ABNORMAL HIGH (ref 150–400)
RBC: 3.1 MIL/uL — ABNORMAL LOW (ref 4.22–5.81)
RDW: 18 % — ABNORMAL HIGH (ref 11.5–15.5)
WBC: 17.1 10*3/uL — ABNORMAL HIGH (ref 4.0–10.5)
nRBC: 6.1 % — ABNORMAL HIGH (ref 0.0–0.2)

## 2020-03-28 LAB — POCT URINALYSIS DIPSTICK (MANUAL)
Leukocytes, UA: NEGATIVE
Nitrite, UA: NEGATIVE
Poct Blood: NEGATIVE
Poct Glucose: NORMAL mg/dL
Poct Ketones: NEGATIVE
Poct Urobilinogen: NORMAL mg/dL
Spec Grav, UA: 1.02 (ref 1.010–1.025)
pH, UA: 5.5 (ref 5.0–8.0)

## 2020-03-28 MED ORDER — KETOROLAC TROMETHAMINE 30 MG/ML IJ SOLN
15.0000 mg | Freq: Once | INTRAMUSCULAR | Status: AC
  Administered 2020-03-28: 15 mg via INTRAVENOUS
  Filled 2020-03-28: qty 1

## 2020-03-28 MED ORDER — HYDROXYUREA 500 MG PO CAPS: 1500.0000 mg | ORAL_CAPSULE | Freq: Every day | ORAL | 3 refills | Status: DC

## 2020-03-28 MED ORDER — ONDANSETRON 4 MG PO TBDP: 4.0000 mg | ORAL_TABLET | Freq: Four times a day (QID) | ORAL | Status: DC | PRN

## 2020-03-28 MED ORDER — SODIUM CHLORIDE 0.45 % IV SOLN: INTRAVENOUS | Status: DC

## 2020-03-28 MED ORDER — NALOXEGOL OXALATE 25 MG PO TABS: 25.0000 mg | ORAL_TABLET | Freq: Every day | ORAL | 3 refills | Status: AC

## 2020-03-28 MED ORDER — OXYCODONE HCL 20 MG PO TABS: 1.0000 | ORAL_TABLET | Freq: Four times a day (QID) | ORAL | 0 refills | Status: DC | PRN

## 2020-03-28 MED ORDER — OXYCODONE HCL 5 MG PO TABS: 20.0000 mg | ORAL_TABLET | Freq: Once | ORAL | Status: DC

## 2020-03-28 MED ORDER — ACETAMINOPHEN 500 MG PO TABS
1000.0000 mg | ORAL_TABLET | Freq: Once | ORAL | Status: AC
  Administered 2020-03-28: 1000 mg via ORAL
  Filled 2020-03-28: qty 2

## 2020-03-28 MED ORDER — HYDROMORPHONE HCL 1 MG/ML IJ SOLN
1.0000 mg | INTRAMUSCULAR | Status: AC
  Administered 2020-03-28 (×2): 1 mg via INTRAVENOUS
  Filled 2020-03-28 (×3): qty 1

## 2020-03-28 MED ORDER — DIPHENHYDRAMINE HCL 25 MG PO CAPS: 25.0000 mg | ORAL_CAPSULE | Freq: Four times a day (QID) | ORAL | Status: DC | PRN

## 2020-03-28 MED ORDER — POLYETHYLENE GLYCOL 3350 17 G PO PACK: 17.0000 g | PACK | Freq: Every day | ORAL | Status: DC | PRN

## 2020-03-28 NOTE — Progress Notes (Signed)
[place], [place]  [address] Phone:  [phone]   Fax:  [phone]   Established Patient Office Visit  Subjective:  Patient ID: Alejandro Soto, male    DOB: 03-04-1986  Age: 34 y.o. MRN: [id number]  CC:  Chief Complaint  Patient presents with  . Follow-up    HPI Alejandro Soto presents for follow up. He  has a past medical history of Insomnia (08/2019), Pneumonia, and Sickle cell anemia ([place]).   He complains of 7/10 lower back and legs. He describes it as an aching pain that does radiates down into his leg. He uses his medication at home he has not had any ER visit.  He admits that he is not feeling well over the last few days.  Like to see if he hospital.  Past Medical History:  Diagnosis Date  . Insomnia 08/2019  . Pneumonia   . Sickle cell anemia (HCC)     Past Surgical History:  Procedure Laterality Date  . NO PAST SURGERIES      Family History  Problem Relation Age of Onset  . Cancer Maternal Grandmother        colon   . Diabetes Father   . Cancer Father 56       Prostate  . Asthma Brother     Social History   Socioeconomic History  . Marital status: Single    Spouse name: Not on file  . Number of children: Not on file  . Years of education: Not on file  . Highest education level: Not on file  Occupational History  . Not on file  Tobacco Use  . Smoking status: Current Some Day Smoker    Packs/day: 0.25    Years: 5.00    Pack years: 1.25    Types: Cigarettes  . Smokeless tobacco: Never Used  Vaping Use  . Vaping Use: Never used  Substance and Sexual Activity  . Alcohol use: No    Alcohol/week: 0.0 standard drinks  . Drug use: No  . Sexual activity: Yes    Birth control/protection: Condom  Other Topics Concern  . Not on file  Social History Narrative  . Not on file   Social Determinants of Health   Financial Resource Strain:   . Difficulty of Paying Living Expenses: Not on file  Food  Insecurity:   . Worried About Charity fundraiser in the Last Year: Not on file  . Ran Out of Food in the Last Year: Not on file  Transportation Needs:   . Lack of Transportation (Medical): Not on file  . Lack of Transportation (Non-Medical): Not on file  Physical Activity:   . Days of Exercise per Week: Not on file  . Minutes of Exercise per Session: Not on file  Stress:   . Feeling of Stress : Not on file  Social Connections:   . Frequency of Communication with Friends and Family: Not on file  . Frequency of Social Gatherings with Friends and Family: Not on file  . Attends Religious Services: Not on file  . Active Member of Clubs or Organizations: Not on file  . Attends Archivist Meetings: Not on file  . Marital Status: Not on file  Intimate Partner Violence:   . Fear of Current or Ex-Partner: Not on file  . Emotionally Abused: Not on file  . Physically Abused: Not on file  . Sexually Abused: Not on file    Outpatient  Medications Prior to Visit  Medication Sig Dispense Refill  . folic acid (FOLVITE) 1 MG tablet Take 1 tablet (1 mg total) by mouth daily. 30 tablet 11  . gabapentin (NEURONTIN) 600 MG tablet Take 1 capsule, by mouth, 2 times a day. 60 tablet 6  . ibuprofen (ADVIL,MOTRIN) 800 MG tablet Take 1 tablet (800 mg total) by mouth every 8 (eight) hours as needed for mild pain. 90 tablet 0  . Multiple Vitamin (MULTIVITAMIN) tablet Take 2 tablets by mouth daily.     . traZODone (DESYREL) 150 MG tablet Take 1 tablet (150 mg total) by mouth at bedtime. 30 tablet 6  . triamcinolone (KENALOG) 0.025 % ointment Apply 1 application topically 2 (two) times daily. 30 g 2  . hydroxyurea (HYDREA) 500 MG capsule Take 3 capsules (1,500 mg total) by mouth daily. May take with food to minimize GI side effects. 270 capsule 3  . Oxycodone HCl 20 MG TABS Take 1 tablet (20 mg total) by mouth every 6 (six) hours as needed for up to 15 days. 60 tablet 0  . busPIRone (BUSPAR) 5 MG tablet  Take 1 tablet (5 mg total) by mouth 2 (two) times daily. (Patient not taking: Reported on 01/20/2018) 30 tablet 1  . REXULTI 1 MG TABS  (Patient not taking: Reported on 12/27/2019)    . naloxegol oxalate (MOVANTIK) 25 MG TABS tablet Take 1 tablet (25 mg total) by mouth daily. 30 tablet 2   No facility-administered medications prior to visit.    Allergies  Allergen Reactions  . Amoxicillin Diarrhea    Did it involve swelling of the face/tongue/throat, SOB, or low BP? No Did it involve sudden or severe rash/hives, skin peeling, or any reaction on the inside of your mouth or nose? No Did you need to seek medical attention at a hospital or doctor's office? No When did it last happen?childhood If all above answers are "NO", may proceed with cephalosporin use.     ROS Review of Systems  Respiratory: Negative for cough and shortness of breath.   Cardiovascular: Negative for chest pain and leg swelling.  Gastrointestinal: Positive for vomiting (one night ). Negative for constipation, diarrhea and nausea.  Endocrine: Negative.   Neurological: Positive for headaches (occasional).      Objective:    Physical Exam Constitutional:      Appearance: He is obese.  HENT:     Head: Normocephalic and atraumatic.     Nose: Nose normal.     Mouth/Throat:     Mouth: Mucous membranes are moist.  Cardiovascular:     Rate and Rhythm: Normal rate and regular rhythm.     Pulses: Normal pulses.     Heart sounds: Normal heart sounds.  Pulmonary:     Effort: Pulmonary effort is normal.     Breath sounds: Normal breath sounds.  Abdominal:     General: Bowel sounds are normal.     Palpations: Abdomen is soft.  Musculoskeletal:     Cervical back: Normal range of motion.  Skin:    General: Skin is warm.     Capillary Refill: Capillary refill takes less than 2 seconds.  Neurological:     General: No focal deficit present.     Mental Status: He is alert and oriented to person, place, and time.    Psychiatric:        Mood and Affect: Mood normal.        Behavior: Behavior normal.  Thought Content: Thought content normal.        Judgment: Judgment normal.     Temp 97.7 F (36.5 C) (Temporal)   Ht 5\' 8"  (1.727 m)   Wt 193 lb (87.5 kg)   BMI 29.35 kg/m  Wt Readings from Last 3 Encounters:  03/28/20 193 lb (87.5 kg)  12/27/19 192 lb 0.2 oz (87.1 kg)  09/19/19 196 lb (88.9 kg)     Health Maintenance Due  Topic Date Due  . COVID-19 Vaccine (1) Never done    There are no preventive care reminders to display for this patient.  Lab Results  Component Value Date   TSH 0.78 09/11/2016   Lab Results  Component Value Date   WBC 17.1 (H) 03/28/2020   HGB 9.8 (L) 03/28/2020   HCT 28.8 (L) 03/28/2020   MCV 92.9 03/28/2020   PLT 407 (H) 03/28/2020   Lab Results  Component Value Date   NA 136 03/28/2020   K 4.5 03/28/2020   CO2 24 03/28/2020   GLUCOSE 89 03/28/2020   BUN 6 03/28/2020   CREATININE 0.67 03/28/2020   BILITOT 1.8 (H) 03/28/2020   ALKPHOS 72 03/28/2020   AST 59 (H) 03/28/2020   ALT 43 03/28/2020   PROT 8.3 (H) 03/28/2020   ALBUMIN 4.4 03/28/2020   CALCIUM 8.7 (L) 03/28/2020   ANIONGAP 7 03/28/2020   No results found for: CHOL No results found for: HDL No results found for: LDLCALC No results found for: TRIG No results found for: CHOLHDL Lab Results  Component Value Date   HGBA1C <4.0 09/07/2013      Assessment & Plan:   Problem List Items Addressed This Visit      Other   Chronic prescription opiate use   Relevant Medications   Oxycodone HCl 20 MG TABS (Start on 03/31/2020)   Other Relevant Orders   Sickle Cell Panel (Completed)   Sickle cell disease, type SS (HCC) Ensure adequate hydration.  Patient was admitted to day hospital for hydration and pain medication. Move frequently to reduce venous thromboembolism risk. Avoid situations that could lead to dehydration or could exacerbate pain Discussed S&S of infection, seizures,  stroke acute chest, DVT and how important it is to seek medical attention Take medication as directed along with pain contract and overall compliance Discussed the risk related to opiate use (addition, tolerance and dependency)     Relevant Medications   hydroxyurea (HYDREA) 500 MG capsule   Oxycodone HCl 20 MG TABS (Start on 03/31/2020)   Other Relevant Orders   Sickle Cell Panel (Completed)   POCT Urinalysis Dip Manual (Completed)    Other Visit Diagnoses    Constipation due to opioid therapy     Discussed constipation at length.   Relevant Medications   naloxegol oxalate (MOVANTIK) 25 MG TABS tablet      Meds ordered this encounter  Medications  . hydroxyurea (HYDREA) 500 MG capsule    Sig: Take 3 capsules (1,500 mg total) by mouth daily. May take with food to minimize GI side effects.    Dispense:  270 capsule    Refill:  3  . Oxycodone HCl 20 MG TABS    Sig: Take 1 tablet (20 mg total) by mouth every 6 (six) hours as needed for up to 15 days.    Dispense:  60 tablet    Refill:  0  . naloxegol oxalate (MOVANTIK) 25 MG TABS tablet    Sig: Take 1 tablet (25 mg total) by mouth  daily.    Dispense:  90 tablet    Refill:  3    Order Specific Question:   Supervising Provider    Answer:   Tresa Garter [[id number]]    Follow-up: Return in about 2 months (around 05/28/2020).    Vevelyn Francois, NP

## 2020-03-28 NOTE — Discharge Instructions (Signed)
Sickle Cell Anemia, Adult  Sickle cell anemia is a condition where your red blood cells are shaped like sickles. Red blood cells carry oxygen through the body. Sickle-shaped cells do not live as long as normal red blood cells. They also clump together and block blood from flowing through the blood vessels. This prevents the body from getting enough oxygen. Sickle cell anemia causes organ damage and pain. It also increases the risk of infection. Follow these instructions at home: Medicines  Take over-the-counter and prescription medicines only as told by your doctor.  If you were prescribed an antibiotic medicine, take it as told by your doctor. Do not stop taking the antibiotic even if you start to feel better.  If you develop a fever, do not take medicines to lower the fever right away. Tell your doctor about the fever. Managing pain, stiffness, and swelling  Try these methods to help with pain: ? Use a heating pad. ? Take a warm bath. ? Distract yourself, such as by watching TV. Eating and drinking  Drink enough fluid to keep your pee (urine) clear or pale yellow. Drink more in hot weather and during exercise.  Limit or avoid alcohol.  Eat a healthy diet. Eat plenty of fruits, vegetables, whole grains, and lean protein.  Take vitamins and supplements as told by your doctor. Traveling  When traveling, keep these with you: ? Your medical information. ? The names of your doctors. ? Your medicines.  If you need to take an airplane, talk to your doctor first. Activity  Rest often.  Avoid exercises that make your heart beat much faster, such as jogging. General instructions  Do not use products that have nicotine or tobacco, such as cigarettes and e-cigarettes. If you need help quitting, ask your doctor.  Consider wearing a medical alert bracelet.  Avoid being in high places (high altitudes), such as mountains.  Avoid very hot or cold temperatures.  Avoid places where the  temperature changes a lot.  Keep all follow-up visits as told by your doctor. This is important. Contact a doctor if:  A joint hurts.  Your feet or hands hurt or swell.  You feel tired (fatigued). Get help right away if:  You have symptoms of infection. These include: ? Fever. ? Chills. ? Being very tired. ? Irritability. ? Poor eating. ? Throwing up (vomiting).  You feel dizzy or faint.  You have new stomach pain, especially on the left side.  You have a an erection (priapism) that lasts more than 4 hours.  You have numbness in your arms or legs.  You have a hard time moving your arms or legs.  You have trouble talking.  You have pain that does not go away when you take medicine.  You are short of breath.  You are breathing fast.  You have a long-term cough.  You have pain in your chest.  You have a bad headache.  You have a stiff neck.  Your stomach looks bloated even though you did not eat much.  Your skin is pale.  You suddenly cannot see well. Summary  Sickle cell anemia is a condition where your red blood cells are shaped like sickles.  Follow your doctor's advice on ways to manage pain, food to eat, activities to do, and steps to take for safe travel.  Get medical help right away if you have any signs of infection, such as a fever. This information is not intended to replace advice given to you by   your health care provider. Make sure you discuss any questions you have with your health care provider. Document Revised: 09/30/2018 Document Reviewed: 07/14/2016 Elsevier Patient Education  2020 Elsevier Inc.  

## 2020-03-28 NOTE — Discharge Summary (Signed)
[facility] Discharge Summary   Patient ID: Alejandro Soto MRN: [id number] DOB/AGE: 1985/10/04 34 y.o.  Admit date: 03/28/2020 Discharge date: 03/28/2020  Primary Care Physician:  Vevelyn Francois, NP  Admission Diagnoses:  Active Problems:   Sickle cell pain crisis [facility])   Discharge Medications:  Allergies as of 03/28/2020      Reactions   Amoxicillin Diarrhea   Did it involve swelling of the face/tongue/throat, SOB, or low BP? No Did it involve sudden or severe rash/hives, skin peeling, or any reaction on the inside of your mouth or nose? No Did you need to seek medical attention at a hospital or doctor's office? No When did it last happen?childhood If all above answers are "NO", may proceed with cephalosporin use.      Medication List    TAKE these medications   busPIRone 5 MG tablet Commonly known as: BUSPAR Take 1 tablet (5 mg total) by mouth 2 (two) times daily.   folic acid 1 MG tablet Commonly known as: FOLVITE Take 1 tablet (1 mg total) by mouth daily.   gabapentin 600 MG tablet Commonly known as: NEURONTIN Take 1 capsule, by mouth, 2 times a day.   hydroxyurea 500 MG capsule Commonly known as: HYDREA Take 3 capsules (1,500 mg total) by mouth daily. May take with food to minimize GI side effects.   ibuprofen 800 MG tablet Commonly known as: ADVIL Take 1 tablet (800 mg total) by mouth every 8 (eight) hours as needed for mild pain.   multivitamin tablet Take 2 tablets by mouth daily.   naloxegol oxalate 25 MG Tabs tablet Commonly known as: Movantik Take 1 tablet (25 mg total) by mouth daily.   Oxycodone HCl 20 MG Tabs Take 1 tablet (20 mg total) by mouth every 6 (six) hours as needed for up to 15 days. Start taking on: March 31, 2020   Rexulti 1 MG Tabs tablet Generic drug: brexpiprazole   traZODone 150 MG tablet Commonly known as: DESYREL Take 1 tablet (150 mg total) by mouth at bedtime.   triamcinolone 0.025 %  ointment Commonly known as: KENALOG Apply 1 application topically 2 (two) times daily.        Consults:  None  Significant Diagnostic Studies:  No results found.  History of present illness:  Alejandro Soto is a 34 year old male with a medical history significant for sickle cell disease, chronic pain syndrome, opiate dependence and tolerance, and history of anemia of chronic disease that presents complaining of pain primarily to low back and lower extremities over the past 3 days.  Patient attributes current pain crisis to increased stressors at home and changes in weather.  Patient's current pain intensity is 7/10, constant, and aching.  He last had oxycodone this a.m. without sustained relief.  Patient has been taking medications consistently over the past several days.  Agree with primary care provider that patient was appropriate to transition to day infusion center for pain management and extended observation.  Patient denies any fever, chills, chest pain, shortness of breath, urinary symptoms, nausea, vomiting, or diarrhea.  No sick contacts, recent travel, or exposure to COVID-19.  Of note, patient has not been vaccinated against COVID-19 infection. [facility] Course: Patient admitted to sickle cell day clinic for management of pain crisis.  All laboratory values reviewed.  WBCs elevated at 17.1, appear to be chronically elevated.  Patient is afebrile without any signs of infection or inflammation.  Also, hemoglobin is 9.8, which is consistent  with his baseline, there is no clinical indication for blood transfusion on today. Pain managed with Dilaudid 1 mg IV x2 doses. IV fluids, 0.45% saline at 150 mL/h IV Toradol x1 Tylenol 1000 mg x 1. Pain intensity decreased to 4/10.  Patient is requesting discharge home.  He does not warrant inpatient admission on today.  He is alert, oriented, and ambulating without assistance. Patient will discharge home in a hemodynamically stable  condition.  Also, patient advised to follow-up with PCP for medication management as scheduled.  Discharge instructions: Resume all home medications.   Follow up with PCP as previously  scheduled.   Discussed the importance of drinking 64 ounces of water daily, dehydration of red blood cells may lead further sickling.   Avoid all stressors that precipitate sickle cell pain crisis.     The patient was given clear instructions to go to ER or return to medical center if symptoms do not improve, worsen or new problems develop.    Physical Exam at Discharge:  BP (!) 97/59 (BP Location: Right Arm)   Pulse 69   Temp 97.9 F (36.6 C) (Temporal)   Resp 16   SpO2 96%     Disposition at Discharge: Discharge disposition: 01-Home or Self Care       Discharge Orders: Discharge Instructions    Discharge patient   Complete by: As directed    Discharge disposition: 01-Home or Self Care   Discharge patient date: 03/28/2020      Condition at Discharge:   Stable  Time spent on Discharge:  Greater than 30 minutes.  Signed: Donia Pounds  APRN, MSN, FNP-C [facility] [address] [place], [place] [address] [phone]  03/28/2020, 4:12 PM

## 2020-03-28 NOTE — Patient Instructions (Addendum)

## 2020-03-28 NOTE — H&P (Signed)
[facility] History and Physical   Date: 03/28/2020  Patient name: Alejandro Soto Medical record number: [id number] Date of birth: August 08, 1985 Age: 34 y.o. Gender: male PCP: Vevelyn Francois, NP  Attending physician: Tresa Garter, MD  Chief Complaint: Sickle cell pain  History of Present Illness: Alejandro Soto is a 34 year old male with a medical history significant for sickle cell disease, chronic pain syndrome, opiate dependence and tolerance, and history of anemia of chronic disease that presents complaining of pain primarily to low back and lower extremities over the past 3 days.  Patient attributes current pain crisis to increased stressors at home and changes in weather.  Patient's current pain intensity is 7/10, constant, and aching.  He last had oxycodone this a.m. without sustained relief.  Patient has been taking medications consistently over the past several days.  Agree with primary care provider that patient was appropriate to transition to day infusion center for pain management and extended observation.  Patient denies any fever, chills, chest pain, shortness of breath, urinary symptoms, nausea, vomiting, or diarrhea.  No sick contacts, recent travel, or exposure to COVID-19.  Of note, patient has not been vaccinated against COVID-19 infection.  Meds: Medications Prior to Admission  Medication Sig Dispense Refill Last Dose  . busPIRone (BUSPAR) 5 MG tablet Take 1 tablet (5 mg total) by mouth 2 (two) times daily. (Patient not taking: Reported on 01/20/2018) 30 tablet 1   . folic acid (FOLVITE) 1 MG tablet Take 1 tablet (1 mg total) by mouth daily. 30 tablet 11   . gabapentin (NEURONTIN) 600 MG tablet Take 1 capsule, by mouth, 2 times a day. 60 tablet 6   . hydroxyurea (HYDREA) 500 MG capsule Take 3 capsules (1,500 mg total) by mouth daily. May take with food to minimize GI side effects. 270 capsule 3   . ibuprofen (ADVIL,MOTRIN) 800 MG tablet Take 1 tablet (800 mg  total) by mouth every 8 (eight) hours as needed for mild pain. 90 tablet 0   . Multiple Vitamin (MULTIVITAMIN) tablet Take 2 tablets by mouth daily.      . naloxegol oxalate (MOVANTIK) 25 MG TABS tablet Take 1 tablet (25 mg total) by mouth daily. 90 tablet 3   . [START ON 03/31/2020] Oxycodone HCl 20 MG TABS Take 1 tablet (20 mg total) by mouth every 6 (six) hours as needed for up to 15 days. 60 tablet 0   . REXULTI 1 MG TABS  (Patient not taking: Reported on 12/27/2019)     . traZODone (DESYREL) 150 MG tablet Take 1 tablet (150 mg total) by mouth at bedtime. 30 tablet 6   . triamcinolone (KENALOG) 0.025 % ointment Apply 1 application topically 2 (two) times daily. 30 g 2     Allergies: Amoxicillin Past Medical History:  Diagnosis Date  . Insomnia 08/2019  . Pneumonia   . Sickle cell anemia (HCC)    Past Surgical History:  Procedure Laterality Date  . NO PAST SURGERIES     Family History  Problem Relation Age of Onset  . Cancer Maternal Grandmother        colon   . Diabetes Father   . Cancer Father 52       Prostate  . Asthma Brother    Social History   Socioeconomic History  . Marital status: Single    Spouse name: Not on file  . Number of children: Not on file  . Years of education: Not on file  .  Highest education level: Not on file  Occupational History  . Not on file  Tobacco Use  . Smoking status: Current Some Day Smoker    Packs/day: 0.25    Years: 5.00    Pack years: 1.25    Types: Cigarettes  . Smokeless tobacco: Never Used  Vaping Use  . Vaping Use: Never used  Substance and Sexual Activity  . Alcohol use: No    Alcohol/week: 0.0 standard drinks  . Drug use: No  . Sexual activity: Yes    Birth control/protection: Condom  Other Topics Concern  . Not on file  Social History Narrative  . Not on file   Social Determinants of Health   Financial Resource Strain:   . Difficulty of Paying Living Expenses: Not on file  Food Insecurity:   . Worried About  Charity fundraiser in the Last Year: Not on file  . Ran Out of Food in the Last Year: Not on file  Transportation Needs:   . Lack of Transportation (Medical): Not on file  . Lack of Transportation (Non-Medical): Not on file  Physical Activity:   . Days of Exercise per Week: Not on file  . Minutes of Exercise per Session: Not on file  Stress:   . Feeling of Stress : Not on file  Social Connections:   . Frequency of Communication with Friends and Family: Not on file  . Frequency of Social Gatherings with Friends and Family: Not on file  . Attends Religious Services: Not on file  . Active Member of Clubs or Organizations: Not on file  . Attends Archivist Meetings: Not on file  . Marital Status: Not on file  Intimate Partner Violence:   . Fear of Current or Ex-Partner: Not on file  . Emotionally Abused: Not on file  . Physically Abused: Not on file  . Sexually Abused: Not on file   Review of Systems  Constitutional: Negative for chills and fever.  Eyes: Negative.   Respiratory: Negative.   Cardiovascular: Negative.   Gastrointestinal: Negative.   Genitourinary: Negative.   Musculoskeletal: Positive for back pain and joint pain.  Skin: Negative.   Neurological: Negative.   Psychiatric/Behavioral: Negative.     Physical Exam: There were no vitals taken for this visit. Physical Exam Constitutional:      Appearance: Normal appearance.  HENT:     Nose: Nose normal.  Eyes:     Pupils: Pupils are equal, round, and reactive to light.  Cardiovascular:     Rate and Rhythm: Normal rate and regular rhythm.     Pulses: Normal pulses.  Pulmonary:     Effort: Pulmonary effort is normal.  Abdominal:     General: Abdomen is flat.  Musculoskeletal:        General: Normal range of motion.  Skin:    General: Skin is warm.  Neurological:     General: No focal deficit present.     Mental Status: He is alert. Mental status is at baseline.  Psychiatric:        Mood and  Affect: Mood normal.        Thought Content: Thought content normal.        Judgment: Judgment normal.      Lab results: Results for orders placed or performed in visit on 03/28/20 (from the past 24 hour(s))  POCT Urinalysis Dip Manual     Status: Abnormal   Collection Time: 03/28/20 11:44 AM  Result Value Ref Range  Spec Grav, UA 1.020 1.010 - 1.025   pH, UA 5.5 5.0 - 8.0   Leukocytes, UA Negative Negative   Nitrite, UA Negative Negative   Poct Protein trace Negative, trace mg/dL   Poct Glucose Normal Normal mg/dL   Poct Ketones Negative Negative   Poct Urobilinogen Normal Normal mg/dL   Poct Bilirubin + (A) Negative   Poct Blood Negative Negative, trace    Imaging results:  No results found.   Assessment & Plan: Patient admitted to sickle cell day clinic for management of pain crisis. He is opiate tolerant. Initiate IV Dilaudid 1 mg x 3 doses Oxycodone 20 mg x 1 IV fluids, 0.45% saline at 150 mL/h Toradol 15 mg IV x1 Tylenol 1000 mg by mouth x1 Review CBC with differential, complete metabolic panel, and reticulocytes as results become available. Pain will be reevaluated in context of function and relationship to baseline as care progresses. If pain intensity remains elevated, and/or hemodynamic stability changes, consider transitioning to inpatient services for higher level of care.   Donia Pounds  APRN, MSN, FNP-C [facility] [address] [place], [place] [address] [phone]  03/28/2020, 12:02 PM

## 2020-03-28 NOTE — Progress Notes (Signed)
Patient admitted to the day hospital for sickle cell pain. Initially, patient reported back and bilateral leg pain rated 7/10. For pain management, patient given a total of 2 mg Dilaudid IV, 15 mg Toradol, 1000 mg Tylenol and patient hydrated with IV fluids. At discharge, patient rated pain at 4/10. Vital signs stable. Discharge instructions given. Patient alert, oriented and ambulatory at discharge.

## 2020-03-29 LAB — CMP14+CBC/D/PLT+FER+RETIC+V...
ALT: 41 IU/L (ref 0–44)
AST: 79 IU/L — ABNORMAL HIGH (ref 0–40)
Albumin/Globulin Ratio: 1.5 (ref 1.2–2.2)
Albumin: 4.5 g/dL (ref 4.0–5.0)
Alkaline Phosphatase: 82 IU/L (ref 44–121)
BUN/Creatinine Ratio: 8 — ABNORMAL LOW (ref 9–20)
BUN: 5 mg/dL — ABNORMAL LOW (ref 6–20)
Basophils Absolute: 0.1 10*3/uL (ref 0.0–0.2)
Basos: 1 %
Bilirubin Total: 1.7 mg/dL — ABNORMAL HIGH (ref 0.0–1.2)
CO2: 17 mmol/L — ABNORMAL LOW (ref 20–29)
Calcium: 9.2 mg/dL (ref 8.7–10.2)
Chloride: 104 mmol/L (ref 96–106)
Creatinine, Ser: 0.63 mg/dL — ABNORMAL LOW (ref 0.76–1.27)
EOS (ABSOLUTE): 0.5 10*3/uL — ABNORMAL HIGH (ref 0.0–0.4)
Eos: 3 %
Ferritin: 1192 ng/mL — ABNORMAL HIGH (ref 30–400)
GFR calc Af Amer: 149 mL/min/{1.73_m2} (ref >59)
GFR calc non Af Amer: 129 mL/min/{1.73_m2} (ref >59)
Globulin, Total: 3.1 g/dL (ref 1.5–4.5)
Glucose: 95 mg/dL (ref 65–99)
Hematocrit: 29.3 % — ABNORMAL LOW (ref 37.5–51.0)
Hemoglobin: 10.1 g/dL — ABNORMAL LOW (ref 13.0–17.7)
Immature Grans (Abs): 0.2 10*3/uL — ABNORMAL HIGH (ref 0.0–0.1)
Immature Granulocytes: 1 %
Lymphocytes Absolute: 4.8 10*3/uL — ABNORMAL HIGH (ref 0.7–3.1)
Lymphs: 27 %
MCH: 32.4 pg (ref 26.6–33.0)
MCHC: 34.5 g/dL (ref 31.5–35.7)
MCV: 94 fL (ref 79–97)
Monocytes Absolute: 1.8 10*3/uL — ABNORMAL HIGH (ref 0.1–0.9)
Monocytes: 10 %
NRBC: 6 % — ABNORMAL HIGH (ref 0–0)
Neutrophils Absolute: 10.3 10*3/uL — ABNORMAL HIGH (ref 1.4–7.0)
Neutrophils: 58 %
Platelets: 444 10*3/uL (ref 150–450)
Potassium: 5 mmol/L (ref 3.5–5.2)
RBC: 3.12 x10E6/uL — ABNORMAL LOW (ref 4.14–5.80)
RDW: 17.1 % — ABNORMAL HIGH (ref 11.6–15.4)
Retic Ct Pct: 10 % — ABNORMAL HIGH (ref 0.6–2.6)
Sodium: 139 mmol/L (ref 134–144)
Total Protein: 7.6 g/dL (ref 6.0–8.5)
Vit D, 25-Hydroxy: 24.1 ng/mL — ABNORMAL LOW (ref 30.0–100.0)
WBC: 17.6 10*3/uL — ABNORMAL HIGH (ref 3.4–10.8)

## 2020-04-10 ENCOUNTER — Telehealth: Payer: Self-pay | Admitting: Nurse Practitioner

## 2020-04-10 ENCOUNTER — Other Ambulatory Visit: Payer: Self-pay | Admitting: Nurse Practitioner

## 2020-04-10 DIAGNOSIS — Z79891 Long term (current) use of opiate analgesic: Secondary | ICD-10-CM

## 2020-04-10 DIAGNOSIS — D5709 Hb-ss disease with crisis with other specified complication: Secondary | ICD-10-CM

## 2020-04-10 MED ORDER — OXYCODONE HCL 20 MG PO TABS: 1.0000 | ORAL_TABLET | Freq: Four times a day (QID) | ORAL | 0 refills | Status: DC | PRN

## 2020-04-10 NOTE — Telephone Encounter (Signed)
Sent!

## 2020-04-12 ENCOUNTER — Telehealth: Payer: Self-pay

## 2020-04-12 NOTE — Telephone Encounter (Signed)
PA was denied due to not trying and failing two preferred medications.    Office notes was sent to medicaid.    Patient has requested that new prescription be sent to [facility] in [place]

## 2020-04-15 ENCOUNTER — Other Ambulatory Visit: Payer: Self-pay | Admitting: Nurse Practitioner

## 2020-04-15 MED ORDER — OXYCODONE HCL 15 MG PO TABS
15.0000 mg | ORAL_TABLET | ORAL | 0 refills | Status: DC | PRN
Start: 2020-04-15 — End: 2020-04-29

## 2020-04-15 NOTE — Progress Notes (Unsigned)
   [facility] [address] , [place]  [address] Phone:  [phone]   Fax:  [phone] 

## 2020-04-15 NOTE — Telephone Encounter (Signed)
Decreased Oxycodone 15 mg dose was sent to [facility]

## 2020-04-29 ENCOUNTER — Other Ambulatory Visit: Payer: Self-pay | Admitting: Nurse Practitioner

## 2020-04-29 ENCOUNTER — Telehealth: Payer: Self-pay | Admitting: Nurse Practitioner

## 2020-04-29 MED ORDER — OXYCODONE HCL 15 MG PO TABS
15.0000 mg | ORAL_TABLET | ORAL | 0 refills | Status: DC | PRN
Start: 2020-04-30 — End: 2020-05-13

## 2020-04-29 NOTE — Telephone Encounter (Signed)
Sent!

## 2020-05-13 ENCOUNTER — Other Ambulatory Visit: Payer: Self-pay | Admitting: Nurse Practitioner

## 2020-05-13 ENCOUNTER — Telehealth: Payer: Self-pay | Admitting: Nurse Practitioner

## 2020-05-13 MED ORDER — OXYCODONE HCL 15 MG PO TABS
15.0000 mg | ORAL_TABLET | ORAL | 0 refills | Status: DC | PRN
Start: 2020-05-15 — End: 2020-05-15

## 2020-05-13 NOTE — Telephone Encounter (Signed)
Sent!

## 2020-05-14 ENCOUNTER — Telehealth: Payer: Self-pay | Admitting: Nurse Practitioner

## 2020-05-15 ENCOUNTER — Other Ambulatory Visit: Payer: Self-pay | Admitting: Nurse Practitioner

## 2020-05-15 DIAGNOSIS — D571 Sickle-cell disease without crisis: Secondary | ICD-10-CM

## 2020-05-15 DIAGNOSIS — G894 Chronic pain syndrome: Secondary | ICD-10-CM

## 2020-05-15 MED ORDER — OXYCODONE HCL 15 MG PO TABS
15.0000 mg | ORAL_TABLET | ORAL | 0 refills | Status: DC | PRN
Start: 2020-05-15 — End: 2020-05-29

## 2020-05-15 MED ORDER — GABAPENTIN 600 MG PO TABS: ORAL_TABLET | ORAL | 6 refills | Status: DC

## 2020-05-15 NOTE — Telephone Encounter (Signed)
Sent!

## 2020-05-15 NOTE — Telephone Encounter (Signed)
sent 

## 2020-05-15 NOTE — Telephone Encounter (Signed)
[facility] pharmacist called and reported that patient has been paying cash for his pain medication and they can no longer allow him to do so as it is against their safe dispensing protocol there, pt must use insurance but PA was not approved, stated if pt needs med today script must be sent to another pharmacy as [facility] will not fill it and dispense at cash price anymore.

## 2020-05-29 ENCOUNTER — Encounter: Payer: Self-pay | Admitting: Nurse Practitioner

## 2020-05-29 ENCOUNTER — Ambulatory Visit (INDEPENDENT_AMBULATORY_CARE_PROVIDER_SITE_OTHER): Payer: Medicaid Other | Admitting: Nurse Practitioner

## 2020-05-29 ENCOUNTER — Other Ambulatory Visit: Payer: Self-pay

## 2020-05-29 VITALS — BP 117/74 | HR 83 | Temp 97.7°F | Resp 18 | Ht 68.0 in | Wt 184.8 lb

## 2020-05-29 DIAGNOSIS — Z13220 Encounter for screening for lipoid disorders: Secondary | ICD-10-CM | POA: Diagnosis not present

## 2020-05-29 DIAGNOSIS — D571 Sickle-cell disease without crisis: Secondary | ICD-10-CM

## 2020-05-29 DIAGNOSIS — Z79891 Long term (current) use of opiate analgesic: Secondary | ICD-10-CM

## 2020-05-29 DIAGNOSIS — G894 Chronic pain syndrome: Secondary | ICD-10-CM | POA: Diagnosis not present

## 2020-05-29 LAB — POCT URINALYSIS DIP (CLINITEK)
Blood, UA: NEGATIVE
Glucose, UA: NEGATIVE mg/dL
Leukocytes, UA: NEGATIVE
Nitrite, UA: NEGATIVE
Spec Grav, UA: 1.02 (ref 1.010–1.025)
Urobilinogen, UA: 4 E.U./dL — AB
pH, UA: 5.5 (ref 5.0–8.0)

## 2020-05-29 MED ORDER — FOLIC ACID 1 MG PO TABS: 1.0000 mg | ORAL_TABLET | Freq: Every day | ORAL | 3 refills | Status: AC

## 2020-05-29 MED ORDER — OXYCODONE HCL 15 MG PO TABS: 15.0000 mg | ORAL_TABLET | ORAL | 0 refills | Status: DC | PRN

## 2020-05-29 MED ORDER — IBUPROFEN 800 MG PO TABS: 800.0000 mg | ORAL_TABLET | Freq: Three times a day (TID) | ORAL | 0 refills | Status: DC | PRN

## 2020-05-29 NOTE — Progress Notes (Signed)
[facility] Case Management Referral Note  05/29/2020 Name: Alejandro Soto MRN: [id number] DOB: 08-20-1985 Alejandro Soto is a 34 y.o. year old male who sees Vevelyn Francois, NP for primary care. LCSW was consulted to assess patient's needs and assist the patient with Financial Difficulties related to insurance not covering medication.  Interpreter: No.   Interpreter Name & Language: none  Assessment: Patient experiencing Medication procurement issues. Patient reports insurance not covering his pain medication and has also had issues with the pharmacy filling other sickle-cell related medications. Patient reports he has gotten letters from his insurance stating they won't cover some medications. His disability income has also been cut off and he is working with a Chief Executive Officer on this.   Intervention: CSW met with patient during PCP visit and also followed up briefly via phone later in the day. Assessed the above problem. CSW and patient together called patient's Medicaid caseworker and left a voicemail requesting call back to discuss the medication coverage issue. CSW will continue to follow.   Review of patient status, including review of consultants reports, relevant laboratory and other test results, and collaboration with appropriate care team members and the patient's provider was performed as part of comprehensive patient evaluation and provision of services.    SDOH (Social Determinants of Health) assessments performed: No    Outpatient Encounter Medications as of 05/29/2020  Medication Sig Note  . busPIRone (BUSPAR) 5 MG tablet Take 1 tablet (5 mg total) by mouth 2 (two) times daily.   . folic acid (FOLVITE) 1 MG tablet Take 1 tablet (1 mg total) by mouth daily.   Marland Kitchen gabapentin (NEURONTIN) 600 MG tablet Take 1 capsule, by mouth, 2 times a day.   . hydroxyurea (HYDREA) 500 MG capsule Take 3 capsules (1,500 mg total) by mouth daily. May take with food to minimize GI side effects.   Marland Kitchen  ibuprofen (ADVIL) 800 MG tablet Take 1 tablet (800 mg total) by mouth every 8 (eight) hours as needed for mild pain.   . Multiple Vitamin (MULTIVITAMIN) tablet Take 2 tablets by mouth daily.    . naloxegol oxalate (MOVANTIK) 25 MG TABS tablet Take 1 tablet (25 mg total) by mouth daily.   Derrill Memo ON 05/30/2020] oxyCODONE (ROXICODONE) 15 MG immediate release tablet Take 1 tablet (15 mg total) by mouth every 4 (four) hours as needed for up to 15 days for pain.   Marland Kitchen REXULTI 1 MG TABS  09/09/2018: Patient not picked up  . traZODone (DESYREL) 150 MG tablet Take 1 tablet (150 mg total) by mouth at bedtime.   . triamcinolone (KENALOG) 0.025 % ointment Apply 1 application topically 2 (two) times daily.   . [DISCONTINUED] folic acid (FOLVITE) 1 MG tablet Take 1 tablet (1 mg total) by mouth daily.   . [DISCONTINUED] ibuprofen (ADVIL,MOTRIN) 800 MG tablet Take 1 tablet (800 mg total) by mouth every 8 (eight) hours as needed for mild pain. 06/21/2019: As needed  . [DISCONTINUED] oxyCODONE (ROXICODONE) 15 MG immediate release tablet Take 1 tablet (15 mg total) by mouth every 4 (four) hours as needed for up to 15 days for pain.    No facility-administered encounter medications on file as of 05/29/2020.    Goals Addressed   None      Follow up Plan: 1. Phone call 06/03/20 to follow up on communication with patient's Medicaid caseworker.   Estanislado Emms, [facility] [phone]

## 2020-05-29 NOTE — Progress Notes (Signed)
[place], [place]  [address] Phone:  [phone]   Fax:  [phone]   Established Patient Office Visit  Subjective:  Patient ID: Alejandro Soto, male    DOB: 26-Feb-1986  Age: 34 y.o. MRN: [id number]  CC:  Chief Complaint  Patient presents with  . Follow-up    HPI Alejandro Soto presents for follow up. He  has a past medical history of Insomnia (08/2019), Pneumonia, and Sickle cell anemia ([place]).   He has 6/10 pain in his lower back and joints; mainly in the leg. He feels like this is apart of his normal pain pattern.  He is currently taking his medication as directed along with his supportive medication, hydroxyurea and gabapentin medication ibuprofen.  He has not been using the folic acid regularly.  Denies fever, headache, cough, wheezing, shortness of breath, chest pains, abdominal pain,  Denies any open wounds, skin irritation.   He had a cough for about 1 week. He was exposed to COV-19. He admits that this has resolved.    Past Medical History:  Diagnosis Date  . Insomnia 08/2019  . Pneumonia   . Sickle cell anemia (HCC)     Past Surgical History:  Procedure Laterality Date  . NO PAST SURGERIES      Family History  Problem Relation Age of Onset  . Cancer Maternal Grandmother        colon   . Diabetes Father   . Cancer Father 73       Prostate  . Asthma Brother     Social History   Socioeconomic History  . Marital status: Single    Spouse name: Not on file  . Number of children: Not on file  . Years of education: Not on file  . Highest education level: Not on file  Occupational History  . Not on file  Tobacco Use  . Smoking status: Current Some Day Smoker    Packs/day: 0.25    Years: 5.00    Pack years: 1.25    Types: Cigarettes  . Smokeless tobacco: Never Used  Vaping Use  . Vaping Use: Never used  Substance and Sexual Activity  . Alcohol use: No    Alcohol/week: 0.0 standard drinks  . Drug use: No   . Sexual activity: Yes    Birth control/protection: Condom  Other Topics Concern  . Not on file  Social History Narrative  . Not on file   Social Determinants of Health   Financial Resource Strain:   . Difficulty of Paying Living Expenses: Not on file  Food Insecurity:   . Worried About Charity fundraiser in the Last Year: Not on file  . Ran Out of Food in the Last Year: Not on file  Transportation Needs:   . Lack of Transportation (Medical): Not on file  . Lack of Transportation (Non-Medical): Not on file  Physical Activity:   . Days of Exercise per Week: Not on file  . Minutes of Exercise per Session: Not on file  Stress:   . Feeling of Stress : Not on file  Social Connections:   . Frequency of Communication with Friends and Family: Not on file  . Frequency of Social Gatherings with Friends and Family: Not on file  . Attends Religious Services: Not on file  . Active Member of Clubs or Organizations: Not on file  . Attends Archivist Meetings: Not on file  . Marital Status: Not on  file  Intimate Partner Violence:   . Fear of Current or Ex-Partner: Not on file  . Emotionally Abused: Not on file  . Physically Abused: Not on file  . Sexually Abused: Not on file    Outpatient Medications Prior to Visit  Medication Sig Dispense Refill  . busPIRone (BUSPAR) 5 MG tablet Take 1 tablet (5 mg total) by mouth 2 (two) times daily. 30 tablet 1  . gabapentin (NEURONTIN) 600 MG tablet Take 1 capsule, by mouth, 2 times a day. 60 tablet 6  . hydroxyurea (HYDREA) 500 MG capsule Take 3 capsules (1,500 mg total) by mouth daily. May take with food to minimize GI side effects. 270 capsule 3  . Multiple Vitamin (MULTIVITAMIN) tablet Take 2 tablets by mouth daily.     . naloxegol oxalate (MOVANTIK) 25 MG TABS tablet Take 1 tablet (25 mg total) by mouth daily. 90 tablet 3  . REXULTI 1 MG TABS     . traZODone (DESYREL) 150 MG tablet Take 1 tablet (150 mg total) by mouth at bedtime. 30  tablet 6  . triamcinolone (KENALOG) 0.025 % ointment Apply 1 application topically 2 (two) times daily. 30 g 2  . folic acid (FOLVITE) 1 MG tablet Take 1 tablet (1 mg total) by mouth daily. 30 tablet 11  . ibuprofen (ADVIL,MOTRIN) 800 MG tablet Take 1 tablet (800 mg total) by mouth every 8 (eight) hours as needed for mild pain. 90 tablet 0  . oxyCODONE (ROXICODONE) 15 MG immediate release tablet Take 1 tablet (15 mg total) by mouth every 4 (four) hours as needed for up to 15 days for pain. 60 tablet 0   No facility-administered medications prior to visit.    Allergies  Allergen Reactions  . Amoxicillin Diarrhea    Did it involve swelling of the face/tongue/throat, SOB, or low BP? No Did it involve sudden or severe rash/hives, skin peeling, or any reaction on the inside of your mouth or nose? No Did you need to seek medical attention at a hospital or doctor's office? No When did it last happen?childhood If all above answers are "NO", may proceed with cephalosporin use.     ROS Review of Systems    Objective:    Physical Exam Constitutional:      General: He is not in acute distress.    Appearance: He is not ill-appearing, toxic-appearing or diaphoretic.  HENT:     Head: Normocephalic and atraumatic.     Nose: Nose normal.     Mouth/Throat:     Mouth: Mucous membranes are moist.  Cardiovascular:     Rate and Rhythm: Normal rate and regular rhythm.     Pulses: Normal pulses.     Heart sounds: Normal heart sounds.  Pulmonary:     Effort: Pulmonary effort is normal.     Breath sounds: Normal breath sounds.  Abdominal:     Palpations: Abdomen is soft.  Musculoskeletal:     Cervical back: Normal range of motion.     Right lower leg: No edema.     Left lower leg: No edema.  Skin:    General: Skin is warm and dry.     Capillary Refill: Capillary refill takes less than 2 seconds.  Neurological:     General: No focal deficit present.     Mental Status: He is alert and  oriented to person, place, and time.  Psychiatric:        Mood and Affect: Mood normal.  Behavior: Behavior normal.     BP 117/74 (BP Location: Left Arm, Patient Position: Sitting, Cuff Size: Normal)   Pulse 83   Temp 97.7 F (36.5 C) (Temporal)   Resp 18   Ht 5\' 8"  (1.727 m)   Wt 184 lb 12.8 oz (83.8 kg)   SpO2 100%   BMI 28.10 kg/m  Wt Readings from Last 3 Encounters:  05/29/20 184 lb 12.8 oz (83.8 kg)  03/28/20 193 lb (87.5 kg)  12/27/19 192 lb 0.2 oz (87.1 kg)     There are no preventive care reminders to display for this patient.  There are no preventive care reminders to display for this patient.  Lab Results  Component Value Date   TSH 0.78 09/11/2016   Lab Results  Component Value Date   WBC 17.1 (H) 03/28/2020   HGB 9.8 (L) 03/28/2020   HCT 28.8 (L) 03/28/2020   MCV 92.9 03/28/2020   PLT 407 (H) 03/28/2020   Lab Results  Component Value Date   NA 136 03/28/2020   K 4.5 03/28/2020   CO2 24 03/28/2020   GLUCOSE 89 03/28/2020   BUN 6 03/28/2020   CREATININE 0.67 03/28/2020   BILITOT 1.8 (H) 03/28/2020   ALKPHOS 72 03/28/2020   AST 59 (H) 03/28/2020   ALT 43 03/28/2020   PROT 8.3 (H) 03/28/2020   ALBUMIN 4.4 03/28/2020   CALCIUM 8.7 (L) 03/28/2020   ANIONGAP 7 03/28/2020   No results found for: CHOL No results found for: HDL No results found for: LDLCALC No results found for: TRIG No results found for: [facility] Lab Results  Component Value Date   HGBA1C <4.0 09/07/2013      Assessment & Plan:   Problem List Items Addressed This Visit      Other   Chronic prescription opiate use   Relevant Orders   725366 11+Oxyco+Alc+Crt-Bund   Sickle cell disease, type SS ([place]) - Primary Ensure adequate hydration. Move frequently to reduce venous thromboembolism risk. Avoid situations that could lead to dehydration or could exacerbate pain Discussed S&S of infection, seizures, stroke acute chest, DVT and how important it is to seek medical  attention Take medication as directed along with pain contract and overall compliance Discussed the risk related to opiate use (addition, tolerance and dependency)     Relevant Medications   oxyCODONE (ROXICODONE) 15 MG immediate release tablet (Start on 05/30/2020)   ibuprofen (ADVIL) 440 MG tablet   folic acid (FOLVITE) 1 MG tablet   Other Relevant Orders   POCT URINALYSIS DIP (CLINITEK) (Completed)   Sickle Cell Panel   347425 11+Oxyco+Alc+Crt-Bund    Other Visit Diagnoses    Screening for cholesterol level       Chronic pain syndrome       Relevant Medications   oxyCODONE (ROXICODONE) 15 MG immediate release tablet (Start on 05/30/2020)   ibuprofen (ADVIL) 800 MG tablet   Other Relevant Orders   [id number] 11+Oxyco+Alc+Crt-Bund      Meds ordered this encounter  Medications  . oxyCODONE (ROXICODONE) 15 MG immediate release tablet    Sig: Take 1 tablet (15 mg total) by mouth every 4 (four) hours as needed for up to 15 days for pain.    Dispense:  60 tablet    Refill:  0    Order Specific Question:   Supervising Provider    Answer:   Tresa Garter [id number]  . ibuprofen (ADVIL) 800 MG tablet    Sig: Take 1 tablet (800 mg total)  by mouth every 8 (eight) hours as needed for mild pain.    Dispense:  90 tablet    Refill:  0    Order Specific Question:   Supervising Provider    Answer:   Tresa Garter [id number]  . folic acid (FOLVITE) 1 MG tablet    Sig: Take 1 tablet (1 mg total) by mouth daily.    Dispense:  90 tablet    Refill:  3    Order Specific Question:   Supervising Provider    Answer:   Tresa Garter [id number]    Follow-up: Return in about 3 months (around 08/27/2020).    Vevelyn Francois, NP

## 2020-05-29 NOTE — Patient Instructions (Signed)

## 2020-05-30 ENCOUNTER — Telehealth: Payer: Self-pay | Admitting: Clinical

## 2020-05-30 LAB — CMP14+CBC/D/PLT+FER+RETIC+V...
ALT: 27 IU/L (ref 0–44)
AST: 37 IU/L (ref 0–40)
Albumin/Globulin Ratio: 1.5 (ref 1.2–2.2)
Albumin: 4.7 g/dL (ref 4.0–5.0)
Alkaline Phosphatase: 79 IU/L (ref 44–121)
BUN/Creatinine Ratio: 9 (ref 9–20)
BUN: 7 mg/dL (ref 6–20)
Basophils Absolute: 0.1 10*3/uL (ref 0.0–0.2)
Basos: 0 %
Bilirubin Total: 1.4 mg/dL — ABNORMAL HIGH (ref 0.0–1.2)
CO2: 23 mmol/L (ref 20–29)
Calcium: 9.3 mg/dL (ref 8.7–10.2)
Chloride: 100 mmol/L (ref 96–106)
Creatinine, Ser: 0.75 mg/dL — ABNORMAL LOW (ref 0.76–1.27)
EOS (ABSOLUTE): 0.5 10*3/uL — ABNORMAL HIGH (ref 0.0–0.4)
Eos: 3 %
Ferritin: 775 ng/mL — ABNORMAL HIGH (ref 30–400)
GFR calc Af Amer: 138 mL/min/{1.73_m2} (ref >59)
GFR calc non Af Amer: 120 mL/min/{1.73_m2} (ref >59)
Globulin, Total: 3.1 g/dL (ref 1.5–4.5)
Glucose: 79 mg/dL (ref 65–99)
Hematocrit: 29.9 % — ABNORMAL LOW (ref 37.5–51.0)
Hemoglobin: 10.2 g/dL — ABNORMAL LOW (ref 13.0–17.7)
Immature Grans (Abs): 0.1 10*3/uL (ref 0.0–0.1)
Immature Granulocytes: 1 %
Lymphocytes Absolute: 5.5 10*3/uL — ABNORMAL HIGH (ref 0.7–3.1)
Lymphs: 29 %
MCH: 31.2 pg (ref 26.6–33.0)
MCHC: 34.1 g/dL (ref 31.5–35.7)
MCV: 91 fL (ref 79–97)
Monocytes Absolute: 1.2 10*3/uL — ABNORMAL HIGH (ref 0.1–0.9)
Monocytes: 6 %
NRBC: 1 % — ABNORMAL HIGH (ref 0–0)
Neutrophils Absolute: 11.9 10*3/uL — ABNORMAL HIGH (ref 1.4–7.0)
Neutrophils: 61 %
Platelets: 410 10*3/uL (ref 150–450)
Potassium: 4.4 mmol/L (ref 3.5–5.2)
RBC: 3.27 x10E6/uL — ABNORMAL LOW (ref 4.14–5.80)
RDW: 16.7 % — ABNORMAL HIGH (ref 11.6–15.4)
Retic Ct Pct: 6.6 % — ABNORMAL HIGH (ref 0.6–2.6)
Sodium: 138 mmol/L (ref 134–144)
Total Protein: 7.8 g/dL (ref 6.0–8.5)
Vit D, 25-Hydroxy: 14.1 ng/mL — ABNORMAL LOW (ref 30.0–100.0)
WBC: 19.3 10*3/uL — ABNORMAL HIGH (ref 3.4–10.8)

## 2020-05-30 NOTE — Telephone Encounter (Signed)
[facility]al [facility] General Follow Up Note  05/30/2020 Name: Alejandro Soto MRN: [id number] DOB: 1986-01-27 Alejandro Soto is a 34 y.o. year old male who sees Vevelyn Francois, NP for primary care. LCSW was initially consulted to assess patient's needs and assist the patient with financial difficulties related to insurance not covering medication.  Interpreter: No.   Interpreter Name & Language: none  Assessment: Patient experiencing medication procurement issues. Patient reported at PCP visit yesterday that insurance has not been covering his sickle cell related medications.   Ongoing Intervention: Today CSW received a call back from patient's Medicaid caseworker and also coordinated with patient over the phone. Patient reported his medications were filled at the pharmacy this morning and he did not have to pay out of pocket. Caseworker had indicated that patient's medications may require prior authorization, but this does not appear to be the case for this refil. Will continue to follow for support.   Review of patient status, including review of consultants reports, relevant laboratory and other test results, and collaboration with appropriate care team members and the patient's provider was performed as part of comprehensive patient evaluation and provision of services.     Follow up Plan: 1. CSW available from clinic as needed.  Estanislado Emms, [facility] [phone]

## 2020-06-10 ENCOUNTER — Other Ambulatory Visit: Payer: Self-pay | Admitting: Nurse Practitioner

## 2020-06-10 ENCOUNTER — Telehealth: Payer: Self-pay | Admitting: Nurse Practitioner

## 2020-06-10 DIAGNOSIS — D571 Sickle-cell disease without crisis: Secondary | ICD-10-CM

## 2020-06-10 LAB — DRUG SCREEN 764883 11+OXYCO+ALC+CRT-BUND
Amphetamines, Urine: NEGATIVE ng/mL
BENZODIAZ UR QL: NEGATIVE ng/mL
Barbiturate: NEGATIVE ng/mL
Cannabinoid Quant, Ur: NEGATIVE ng/mL
Cocaine (Metabolite): NEGATIVE ng/mL
Creatinine: 156.9 mg/dL (ref 20.0–300.0)
Ethanol: NEGATIVE %
Meperidine: NEGATIVE ng/mL
Methadone Screen, Urine: NEGATIVE ng/mL
OPIATE SCREEN URINE: NEGATIVE ng/mL
Phencyclidine: NEGATIVE ng/mL
Propoxyphene: NEGATIVE ng/mL
Tramadol: NEGATIVE ng/mL
pH, Urine: 5.6 (ref 4.5–8.9)

## 2020-06-10 LAB — OXYCODONE/OXYMORPHONE, CONFIRM
OXYCODONE/OXYMORPH: POSITIVE — AB
OXYCODONE: >3000 ng/mL
OXYCODONE: POSITIVE — AB
OXYMORPHONE (GC/MS): 1316 ng/mL
OXYMORPHONE: POSITIVE — AB

## 2020-06-10 MED ORDER — OXYCODONE HCL 15 MG PO TABS: 15.0000 mg | ORAL_TABLET | Freq: Four times a day (QID) | ORAL | 0 refills | Status: DC | PRN

## 2020-06-10 NOTE — Progress Notes (Signed)
° °  [place],   [address] Phone:  [phone]   Fax:  [phone] PDMP not reviewed this encounter.Subjective:    Alejandro Soto calls for refill of his oxycodone   Objective:   Indication for chronic opioid: Chronic pain related to sickle cell disease Medication and dose: Oxycodone 15 mg # pills per month: 120 Last UDS date: 05/29/2020 Opioid Treatment Agreement signed (Y/N): Yes Opioid Treatment Agreement last reviewed with patient:  2021 [place] reviewed this encounter (include red flags):     Assessment:   Chronic Pain Syndrome related to Sickle Cell Disease    Plan:  Oxycodone 15 mg  Return for follow-up appointment as scheduled.

## 2020-06-10 NOTE — Telephone Encounter (Signed)
Refill sent.

## 2020-06-12 ENCOUNTER — Telehealth: Payer: Self-pay | Admitting: Clinical

## 2020-06-12 NOTE — Telephone Encounter (Signed)
[facility] General Follow Up Note  06/12/2020 Name: Alejandro Soto MRN: [id number] DOB: 1985/08/29 Alejandro Soto is a 34 y.o. year old male who sees Vevelyn Francois, NP for primary care. LCSW was initially consulted to assess patient's needs and assist the patient with financial difficulties related toinsurance not covering medication.  Interpreter: No.   Interpreter Name & Language: none  Assessment: Patient experiencing medication procurement issues.   Ongoing Intervention: Today CSW called patient to follow up on recent issues with insurance covering his pain medication. Previously patient was not able to get his pain medication covered. On 05/30/20, he did get a refill and it was covered. Called patient to check in today, and he reported the medication was covered for his most recent refill this week too. Advised patient that if pharmacy indicates he needs a prior authorization in the future or is having other coverage issues to contact our office. CSW available as needed from clinic.  Review of patient status, including review of consultants reports, relevant laboratory and other test results, and collaboration with appropriate care team members and the patient's provider was performed as part of comprehensive patient evaluation and provision of services.     Follow up Plan: 1. CSW available as needed from clinic  Estanislado Emms, [facility] [phone]

## 2020-06-27 ENCOUNTER — Telehealth: Payer: Self-pay | Admitting: Nurse Practitioner

## 2020-06-28 ENCOUNTER — Other Ambulatory Visit: Payer: Self-pay | Admitting: Nurse Practitioner

## 2020-06-28 DIAGNOSIS — D571 Sickle-cell disease without crisis: Secondary | ICD-10-CM

## 2020-06-28 MED ORDER — OXYCODONE HCL 20 MG PO TABS
1.0000 | ORAL_TABLET | Freq: Four times a day (QID) | ORAL | 0 refills | Status: DC | PRN
Start: 2020-06-29 — End: 2020-07-11

## 2020-06-28 NOTE — Progress Notes (Addendum)
   Alejandro Soto,   [id number] Phone:  [phone]   Fax:  [phone]  PDMP not reviewed this encounter.Subjective:    Alejandro Soto calls for refill of his oxycodone 20 mg.  Patient's dose had been decreased to oxycodone 15 mg with a daily morphine of 90 MME per day however patient requesting to go back to 20 mg which places his daily MME over 90.   Objective:   Indication for chronic opioid: Chronic pain related to sickle cell disease Medication and dose: Oxycodone 20 mg every 6 hours as needed # pills per month: 120 Last UDS date:05/29/2020 Opioid Treatment Agreement signed (Y/N): Yes Opioid Treatment Agreement last reviewed with patient:  06/10/2020 NCCSRS reviewed this encounter (include red flags):  No   Assessment:   Chronic Pain Syndrome related to Sickle Cell Disease    Plan:   Requested refill sent Return for follow-up appointment as scheduled.

## 2020-06-28 NOTE — Telephone Encounter (Signed)
Refill sent.

## 2020-07-11 ENCOUNTER — Telehealth: Payer: Self-pay | Admitting: Nurse Practitioner

## 2020-07-11 ENCOUNTER — Other Ambulatory Visit: Payer: Self-pay | Admitting: Nurse Practitioner

## 2020-07-11 MED ORDER — OXYCODONE HCL 20 MG PO TABS
1.0000 | ORAL_TABLET | Freq: Four times a day (QID) | ORAL | 0 refills | Status: DC | PRN
Start: 2020-07-14 — End: 2020-07-26

## 2020-07-11 NOTE — Telephone Encounter (Signed)
sent 

## 2020-07-18 ENCOUNTER — Telehealth: Payer: Self-pay | Admitting: Nurse Practitioner

## 2020-07-18 NOTE — Telephone Encounter (Signed)
Please call and see why he is requesting this He has not taken this in over a year if he has ever taken this.  Thanks

## 2020-07-19 NOTE — Telephone Encounter (Signed)
Called and spoke with patient , he said that he hasn't taken it over because of his insurance.

## 2020-07-22 NOTE — Telephone Encounter (Signed)
Can we get him scheduled for a video virtual visit thanks

## 2020-07-22 NOTE — Telephone Encounter (Signed)
Called and notified pt of this , informed pt that he will discuss that at his appt.

## 2020-07-25 ENCOUNTER — Telehealth: Payer: Self-pay | Admitting: Nurse Practitioner

## 2020-07-26 ENCOUNTER — Other Ambulatory Visit: Payer: Self-pay | Admitting: Nurse Practitioner

## 2020-07-26 MED ORDER — OXYCODONE HCL 20 MG PO TABS
1.0000 | ORAL_TABLET | Freq: Four times a day (QID) | ORAL | 0 refills | Status: DC | PRN
Start: 2020-07-26 — End: 2020-08-12

## 2020-07-26 NOTE — Telephone Encounter (Signed)
Sent to provider 

## 2020-07-26 NOTE — Telephone Encounter (Signed)
Sent!

## 2020-07-31 ENCOUNTER — Telehealth (HOSPITAL_COMMUNITY): Payer: Self-pay | Admitting: General Practice

## 2020-07-31 ENCOUNTER — Encounter (HOSPITAL_COMMUNITY): Payer: Self-pay | Admitting: Family Medicine

## 2020-07-31 ENCOUNTER — Non-Acute Institutional Stay (HOSPITAL_COMMUNITY)
Admission: AD | Admit: 2020-07-31 | Discharge: 2020-07-31 | Disposition: A | Discharge status: Home / Self Care | Payer: Medicaid Other | Source: Ambulatory Visit | Attending: Internal Medicine | Admitting: Internal Medicine

## 2020-07-31 DIAGNOSIS — D638 Anemia in other chronic diseases classified elsewhere: Secondary | ICD-10-CM | POA: Diagnosis not present

## 2020-07-31 DIAGNOSIS — Z79899 Other long term (current) drug therapy: Secondary | ICD-10-CM | POA: Diagnosis not present

## 2020-07-31 DIAGNOSIS — F17210 Nicotine dependence, cigarettes, uncomplicated: Secondary | ICD-10-CM | POA: Diagnosis not present

## 2020-07-31 DIAGNOSIS — D5700 Hb-SS disease with crisis, unspecified: Secondary | ICD-10-CM | POA: Insufficient documentation

## 2020-07-31 DIAGNOSIS — Z791 Long term (current) use of non-steroidal anti-inflammatories (NSAID): Secondary | ICD-10-CM | POA: Insufficient documentation

## 2020-07-31 DIAGNOSIS — Z833 Family history of diabetes mellitus: Secondary | ICD-10-CM | POA: Insufficient documentation

## 2020-07-31 DIAGNOSIS — G894 Chronic pain syndrome: Secondary | ICD-10-CM | POA: Diagnosis not present

## 2020-07-31 DIAGNOSIS — Z880 Allergy status to penicillin: Secondary | ICD-10-CM | POA: Insufficient documentation

## 2020-07-31 DIAGNOSIS — F1120 Opioid dependence, uncomplicated: Secondary | ICD-10-CM | POA: Insufficient documentation

## 2020-07-31 LAB — CBC WITH DIFFERENTIAL/PLATELET
Abs Immature Granulocytes: 0.12 10*3/uL — ABNORMAL HIGH (ref 0.00–0.07)
Basophils Absolute: 0.1 10*3/uL (ref 0.0–0.1)
Basophils Relative: 0 %
Eosinophils Absolute: 0.7 10*3/uL — ABNORMAL HIGH (ref 0.0–0.5)
Eosinophils Relative: 4 %
HCT: 27.7 % — ABNORMAL LOW (ref 39.0–52.0)
Hemoglobin: 9.6 g/dL — ABNORMAL LOW (ref 13.0–17.0)
Immature Granulocytes: 1 %
Lymphocytes Relative: 37 %
Lymphs Abs: 6.5 10*3/uL — ABNORMAL HIGH (ref 0.7–4.0)
MCH: 31.7 pg (ref 26.0–34.0)
MCHC: 34.7 g/dL (ref 30.0–36.0)
MCV: 91.4 fL (ref 80.0–100.0)
Monocytes Absolute: 1.3 10*3/uL — ABNORMAL HIGH (ref 0.1–1.0)
Monocytes Relative: 7 %
Neutro Abs: 9 10*3/uL — ABNORMAL HIGH (ref 1.7–7.7)
Neutrophils Relative %: 51 %
Platelets: 556 10*3/uL — ABNORMAL HIGH (ref 150–400)
RBC: 3.03 MIL/uL — ABNORMAL LOW (ref 4.22–5.81)
RDW: 16.8 % — ABNORMAL HIGH (ref 11.5–15.5)
WBC: 17.6 10*3/uL — ABNORMAL HIGH (ref 4.0–10.5)
nRBC: 2.6 % — ABNORMAL HIGH (ref 0.0–0.2)

## 2020-07-31 LAB — RETICULOCYTES
Immature Retic Fract: 36.6 % — ABNORMAL HIGH (ref 2.3–15.9)
RBC.: 3.04 MIL/uL — ABNORMAL LOW (ref 4.22–5.81)
Retic Count, Absolute: 227.7 10*3/uL — ABNORMAL HIGH (ref 19.0–186.0)
Retic Ct Pct: 7.5 % — ABNORMAL HIGH (ref 0.4–3.1)

## 2020-07-31 LAB — COMPREHENSIVE METABOLIC PANEL
ALT: 28 U/L (ref 0–44)
AST: 44 U/L — ABNORMAL HIGH (ref 15–41)
Albumin: 4.6 g/dL (ref 3.5–5.0)
Alkaline Phosphatase: 75 U/L (ref 38–126)
Anion gap: 10 (ref 5–15)
BUN: 7 mg/dL (ref 6–20)
CO2: 24 mmol/L (ref 22–32)
Calcium: 8.8 mg/dL — ABNORMAL LOW (ref 8.9–10.3)
Chloride: 103 mmol/L (ref 98–111)
Creatinine, Ser: 0.58 mg/dL — ABNORMAL LOW (ref 0.61–1.24)
GFR, Estimated: >60 mL/min (ref >60)
Glucose, Bld: 104 mg/dL — ABNORMAL HIGH (ref 70–99)
Potassium: 3.9 mmol/L (ref 3.5–5.1)
Sodium: 137 mmol/L (ref 135–145)
Total Bilirubin: 1.7 mg/dL — ABNORMAL HIGH (ref 0.3–1.2)
Total Protein: 7.9 g/dL (ref 6.5–8.1)

## 2020-07-31 MED ORDER — DIPHENHYDRAMINE HCL 25 MG PO CAPS: 25.0000 mg | ORAL_CAPSULE | ORAL | Status: DC | PRN

## 2020-07-31 MED ORDER — SODIUM CHLORIDE 0.45 % IV SOLN: INTRAVENOUS | Status: DC

## 2020-07-31 MED ORDER — HYDROMORPHONE HCL 2 MG/ML IJ SOLN
2.0000 mg | INTRAMUSCULAR | Status: AC
  Administered 2020-07-31 (×3): 2 mg via INTRAVENOUS
  Filled 2020-07-31 (×3): qty 1

## 2020-07-31 MED ORDER — ACETAMINOPHEN 500 MG PO TABS
1000.0000 mg | ORAL_TABLET | Freq: Once | ORAL | Status: AC
  Administered 2020-07-31: 1000 mg via ORAL
  Filled 2020-07-31: qty 2

## 2020-07-31 MED ORDER — ONDANSETRON HCL 4 MG/2ML IJ SOLN: 4.0000 mg | Freq: Four times a day (QID) | INTRAMUSCULAR | Status: DC | PRN

## 2020-07-31 MED ORDER — KETOROLAC TROMETHAMINE 30 MG/ML IJ SOLN
15.0000 mg | Freq: Once | INTRAMUSCULAR | Status: AC
  Administered 2020-07-31: 15 mg via INTRAVENOUS
  Filled 2020-07-31: qty 1

## 2020-07-31 NOTE — Discharge Summary (Signed)
[facility] Discharge Summary   Patient ID: Alejandro Soto MRN: [id number] DOB/AGE: 11-29-1985 35 y.o.  Admit date: 07/31/2020 Discharge date: 07/31/2020  Primary Care Physician:  Vevelyn Francois, NP  Admission Diagnoses:  Active Problems:   Sickle cell pain crisis [facility])   Discharge Medications:  Allergies as of 07/31/2020      Reactions   Amoxicillin Diarrhea   Did it involve swelling of the face/tongue/throat, SOB, or low BP? No Did it involve sudden or severe rash/hives, skin peeling, or any reaction on the inside of your mouth or nose? No Did you need to seek medical attention at a hospital or doctor's office? No When did it last happen?childhood If all above answers are "NO", may proceed with cephalosporin use.      Medication List    TAKE these medications   busPIRone 5 MG tablet Commonly known as: BUSPAR Take 1 tablet (5 mg total) by mouth 2 (two) times daily.   folic acid 1 MG tablet Commonly known as: FOLVITE Take 1 tablet (1 mg total) by mouth daily.   gabapentin 600 MG tablet Commonly known as: NEURONTIN Take 1 capsule, by mouth, 2 times a day.   hydroxyurea 500 MG capsule Commonly known as: HYDREA Take 3 capsules (1,500 mg total) by mouth daily. May take with food to minimize GI side effects.   ibuprofen 800 MG tablet Commonly known as: ADVIL Take 1 tablet (800 mg total) by mouth every 8 (eight) hours as needed for mild pain.   multivitamin tablet Take 2 tablets by mouth daily.   naloxegol oxalate 25 MG Tabs tablet Commonly known as: Movantik Take 1 tablet (25 mg total) by mouth daily.   Oxycodone HCl 20 MG Tabs Take 1 tablet (20 mg total) by mouth 4 (four) times daily as needed for up to 15 days.   Rexulti 1 MG Tabs tablet Generic drug: brexpiprazole   traZODone 150 MG tablet Commonly known as: DESYREL Take 1 tablet (150 mg total) by mouth at bedtime.   triamcinolone 0.025 % ointment Commonly known as: KENALOG Apply 1  application topically 2 (two) times daily.        Consults:  None  Significant Diagnostic Studies:  No results found.  History of present illness:  Alejandro Soto is a 35 year old male with a medical history significant for sickle cell disease, chronic pain syndrome, opiate dependence and tolerance, and history of anemia of chronic disease presents complaining of allover body pain that is consistent with his typical pain crisis.  Patient has fairly well-controlled sickle cell disease with infrequent pain crises.  Patient says the pain intensity has been increasing over the past week.  He has been attempting to manage pain with prescribed home medications without any relief.  He attributes pain crisis to increased stressors at home and changes in weather.  Patient says that pain is mostly to low back and lower extremities and he rates pain at 9/10.  He characterizes pain as constant and aching.  He denies any fever, chills, chest pain, urinary symptoms, nausea, vomiting, or diarrhea.  No sick contacts, recent travel, or exposure to COVID-19.  [facility] Course: Patient admitted to sickle cell day infusion clinic for management of pain crisis. Reviewed all laboratory values.  WBC 17.6, appears to be chronically elevated.  Patient is afebrile without any signs of infection or inflammation.  No antibiotics warranted at this time.  Hemoglobin is stable and consistent with patient's baseline. Pain managed  with Dilaudid 2 mg IV x3 Toradol 15 mg IV x1 Tylenol 1000 mg x 1 IV fluids, 0.45% saline at 100 mL/h. Pain intensity decreased to 6/10.  Patient offered admission, declined.  Patient will possibly return to clinic in a.m. for further pain management and extended observation. Patient is alert, oriented, and ambulating without assistance. Patient will discharge home in a hemodynamically stable condition.  Discharge instructions: Resume all home medications.   Follow up with PCP as  previously  scheduled.   Discussed the importance of drinking 64 ounces of water daily, dehydration of red blood cells may lead further sickling.   Avoid all stressors that precipitate sickle cell pain crisis.     The patient was given clear instructions to go to ER or return to medical center if symptoms do not improve, worsen or new problems develop.     Physical Exam at Discharge:  BP (!) 101/58 (BP Location: Left Arm)   Pulse 79   Temp 98.5 F (36.9 C) (Oral)   Resp 14   SpO2 98%   Physical Exam Constitutional:      Appearance: Normal appearance.  Eyes:     Pupils: Pupils are equal, round, and reactive to light.  Cardiovascular:     Rate and Rhythm: Normal rate and regular rhythm.     Pulses: Normal pulses.  Pulmonary:     Effort: Pulmonary effort is normal.  Abdominal:     General: Abdomen is flat. Bowel sounds are normal.  Musculoskeletal:        General: Normal range of motion.  Skin:    General: Skin is warm.  Neurological:     General: No focal deficit present.     Mental Status: He is alert. Mental status is at baseline.  Psychiatric:        Mood and Affect: Mood normal.        Thought Content: Thought content normal.        Judgment: Judgment normal.      Disposition at Discharge: Discharge disposition: 01-Home or Self Care       Discharge Orders: Discharge Instructions    Discharge patient   Complete by: As directed    Discharge disposition: 01-Home or Self Care   Discharge patient date: 07/31/2020      Condition at Discharge:   Stable  Time spent on Discharge:  Greater than 30 minutes.  Signed: Donia Pounds  APRN, MSN, FNP-C [facility] [address] [place], [place] [address] [phone]  07/31/2020, 4:35 PM

## 2020-07-31 NOTE — H&P (Signed)
[facility] History and Physical   Date: 07/31/2020  Patient name: Alejandro Soto Medical record number: [id number] Date of birth: 04-07-1986 Age: 35 y.o. Gender: male PCP: Vevelyn Francois, NP  Attending physician: Tresa Garter, MD  Chief Complaint: Sickle cell crisis  History of Present Illness: Alejandro Soto is a 35 year old male with a medical history significant for sickle cell disease, chronic pain syndrome, opiate dependence and tolerance, and history of anemia of chronic disease presents complaining of allover body pain that is consistent with his typical pain crisis.  Patient has fairly well-controlled sickle cell disease with infrequent pain crises.  Patient says the pain intensity has been increasing over the past week.  He has been attempting to manage pain with prescribed home medications without any relief.  He attributes pain crisis to increased stressors at home and changes in weather.  Patient says that pain is mostly to low back and lower extremities and he rates pain at 9/10.  He characterizes pain as constant and aching.  He denies any fever, chills, chest pain, urinary symptoms, nausea, vomiting, or diarrhea.  No sick contacts, recent travel, or exposure to COVID-19.  Meds: Medications Prior to Admission  Medication Sig Dispense Refill Last Dose  . busPIRone (BUSPAR) 5 MG tablet Take 1 tablet (5 mg total) by mouth 2 (two) times daily. 30 tablet 1   . folic acid (FOLVITE) 1 MG tablet Take 1 tablet (1 mg total) by mouth daily. 90 tablet 3   . gabapentin (NEURONTIN) 600 MG tablet Take 1 capsule, by mouth, 2 times a day. 60 tablet 6   . hydroxyurea (HYDREA) 500 MG capsule Take 3 capsules (1,500 mg total) by mouth daily. May take with food to minimize GI side effects. 270 capsule 3   . ibuprofen (ADVIL) 800 MG tablet Take 1 tablet (800 mg total) by mouth every 8 (eight) hours as needed for mild pain. 90 tablet 0   . Multiple Vitamin (MULTIVITAMIN) tablet Take  2 tablets by mouth daily.      . naloxegol oxalate (MOVANTIK) 25 MG TABS tablet Take 1 tablet (25 mg total) by mouth daily. 90 tablet 3   . Oxycodone HCl 20 MG TABS Take 1 tablet (20 mg total) by mouth 4 (four) times daily as needed for up to 15 days. 60 tablet 0   . REXULTI 1 MG TABS      . traZODone (DESYREL) 150 MG tablet Take 1 tablet (150 mg total) by mouth at bedtime. 30 tablet 6   . triamcinolone (KENALOG) 0.025 % ointment Apply 1 application topically 2 (two) times daily. 30 g 2     Allergies: Amoxicillin Past Medical History:  Diagnosis Date  . Insomnia 08/2019  . Pneumonia   . Sickle cell anemia (HCC)    Past Surgical History:  Procedure Laterality Date  . NO PAST SURGERIES     Family History  Problem Relation Age of Onset  . Cancer Maternal Grandmother        colon   . Diabetes Father   . Cancer Father 73       Prostate  . Asthma Brother    Social History   Socioeconomic History  . Marital status: Single    Spouse name: Not on file  . Number of children: Not on file  . Years of education: Not on file  . Highest education level: Not on file  Occupational History  . Not on file  Tobacco Use  .  Smoking status: Current Some Day Smoker    Packs/day: 0.25    Years: 5.00    Pack years: 1.25    Types: Cigarettes  . Smokeless tobacco: Never Used  Vaping Use  . Vaping Use: Never used  Substance and Sexual Activity  . Alcohol use: No    Alcohol/week: 0.0 standard drinks  . Drug use: No  . Sexual activity: Yes    Birth control/protection: Condom  Other Topics Concern  . Not on file  Social History Narrative  . Not on file   Social Determinants of Health   Financial Resource Strain: Not on file  Food Insecurity: Not on file  Transportation Needs: Not on file  Physical Activity: Not on file  Stress: Not on file  Social Connections: Not on file  Intimate Partner Violence: Not on file   Review of Systems  Constitutional: Negative for chills and fever.   HENT: Negative.   Eyes: Negative.   Respiratory: Negative.   Cardiovascular: Negative.   Gastrointestinal: Negative.   Genitourinary: Negative.   Musculoskeletal: Positive for back pain and joint pain.  Skin: Negative.   Neurological: Negative.   Psychiatric/Behavioral: Negative.      Physical Exam: Blood pressure 117/67, pulse 87, temperature 98.5 F (36.9 C), temperature source Oral, resp. rate 16, SpO2 97 %. Physical Exam Constitutional:      Appearance: Normal appearance.  Eyes:     Pupils: Pupils are equal, round, and reactive to light.  Cardiovascular:     Rate and Rhythm: Normal rate and regular rhythm.     Pulses: Normal pulses.  Abdominal:     General: Abdomen is flat. Bowel sounds are normal.  Musculoskeletal:        General: Normal range of motion.  Skin:    General: Skin is warm.  Neurological:     General: No focal deficit present.     Mental Status: He is alert. Mental status is at baseline.  Psychiatric:        Mood and Affect: Mood normal.        Thought Content: Thought content normal.        Judgment: Judgment normal.      Lab results: No results found for this or any previous visit (from the past 24 hour(s)).  Imaging results:  No results found.   Assessment & Plan: Patient admitted to sickle cell day infusion center for management of pain crisis.  Patient is opiate tolerant Dilaudid 2 mg IV x3 IV fluids, 0.45% saline at 100 mL/h Toradol 15 mg IV times one dose Tylenol 1000 mg by mouth times one dose Review CBC with differential, complete metabolic panel, and reticulocytes as results become available.  Pain intensity will be reevaluated in context of functioning and relationship to baseline as care progresses If pain intensity remains elevated and/or sudden change in hemodynamic stability transition to inpatient services for higher level of care.     Donia Pounds  APRN, MSN, FNP-C [facility] [address] [place], [place] [address] [phone]    07/31/2020, 11:50 AM

## 2020-07-31 NOTE — Progress Notes (Signed)
Patient admitted to the day hospital for treatment of sickle cell pain crisis. Patient reported pain rated 8/10 in the right arm and left rib cage . Patient given IV Dilaudid, IV Toradol, PO tylenol and hydrated with IV fluids. At discharge patient reported  pain at 6/10. Discharge instructions given to patient. Patient can call back tomorrow for triage. Alert, oriented and ambulatory at discharge.

## 2020-07-31 NOTE — Discharge Instructions (Signed)
Sickle Cell Anemia, Adult  Sickle cell anemia is a condition where your red blood cells are shaped like sickles. Red blood cells carry oxygen through the body. Sickle-shaped cells do not live as long as normal red blood cells. They also clump together and block blood from flowing through the blood vessels. This prevents the body from getting enough oxygen. Sickle cell anemia causes organ damage and pain. It also increases the risk of infection. Follow these instructions at home: Medicines  Take over-the-counter and prescription medicines only as told by your doctor.  If you were prescribed an antibiotic medicine, take it as told by your doctor. Do not stop taking the antibiotic even if you start to feel better.  If you develop a fever, do not take medicines to lower the fever right away. Tell your doctor about the fever. Managing pain, stiffness, and swelling  Try these methods to help with pain: ? Use a heating pad. ? Take a warm bath. ? Distract yourself, such as by watching TV. Eating and drinking  Drink enough fluid to keep your pee (urine) clear or pale yellow. Drink more in hot weather and during exercise.  Limit or avoid alcohol.  Eat a healthy diet. Eat plenty of fruits, vegetables, whole grains, and lean protein.  Take vitamins and supplements as told by your doctor. Traveling  When traveling, keep these with you: ? Your medical information. ? The names of your doctors. ? Your medicines.  If you need to take an airplane, talk to your doctor first. Activity  Rest often.  Avoid exercises that make your heart beat much faster, such as jogging. General instructions  Do not use products that have nicotine or tobacco, such as cigarettes and e-cigarettes. If you need help quitting, ask your doctor.  Consider wearing a medical alert bracelet.  Avoid being in high places (high altitudes), such as mountains.  Avoid very hot or cold temperatures.  Avoid places where the  temperature changes a lot.  Keep all follow-up visits as told by your doctor. This is important. Contact a doctor if:  A joint hurts.  Your feet or hands hurt or swell.  You feel tired (fatigued). Get help right away if:  You have symptoms of infection. These include: ? Fever. ? Chills. ? Being very tired. ? Irritability. ? Poor eating. ? Throwing up (vomiting).  You feel dizzy or faint.  You have new stomach pain, especially on the left side.  You have a an erection (priapism) that lasts more than 4 hours.  You have numbness in your arms or legs.  You have a hard time moving your arms or legs.  You have trouble talking.  You have pain that does not go away when you take medicine.  You are short of breath.  You are breathing fast.  You have a long-term cough.  You have pain in your chest.  You have a bad headache.  You have a stiff neck.  Your stomach looks bloated even though you did not eat much.  Your skin is pale.  You suddenly cannot see well. Summary  Sickle cell anemia is a condition where your red blood cells are shaped like sickles.  Follow your doctor's advice on ways to manage pain, food to eat, activities to do, and steps to take for safe travel.  Get medical help right away if you have any signs of infection, such as a fever. This information is not intended to replace advice given to you by   your health care provider. Make sure you discuss any questions you have with your health care provider. Document Revised: 11/02/2019 Document Reviewed: 11/02/2019 Elsevier Patient Education  2021 [employer]  

## 2020-07-31 NOTE — Telephone Encounter (Signed)
Patient called, requesting to come to the day hospital due to pain in the right arm and left sided pain rated at 8/10. Denied chest pain, fever, diarrhea, abdominal pain, nausea/vomitting and priapism. Screened negative for Covid-19 symptoms. Admitted to having means of transportation (using a bus to come and will get a ride back) without driving self after treatment. Last took 20 mg of oxycodone and 600 mg of gabapentin at 07:00 am today. Per provider, patient can come to the day hospital for treatment. Patient notified, verbalized understanding.

## 2020-08-09 ENCOUNTER — Telehealth: Payer: Self-pay

## 2020-08-12 ENCOUNTER — Other Ambulatory Visit: Payer: Self-pay | Admitting: Nurse Practitioner

## 2020-08-12 MED ORDER — OXYCODONE HCL 20 MG PO TABS: 1.0000 | ORAL_TABLET | Freq: Four times a day (QID) | ORAL | 0 refills | Status: DC | PRN

## 2020-08-27 NOTE — Telephone Encounter (Signed)
error 

## 2020-08-28 ENCOUNTER — Ambulatory Visit (INDEPENDENT_AMBULATORY_CARE_PROVIDER_SITE_OTHER): Payer: Medicaid Other | Admitting: Nurse Practitioner

## 2020-08-28 ENCOUNTER — Other Ambulatory Visit: Payer: Self-pay

## 2020-08-28 ENCOUNTER — Encounter: Payer: Self-pay | Admitting: Nurse Practitioner

## 2020-08-28 VITALS — BP 109/68 | HR 76 | Ht 68.0 in | Wt 186.0 lb

## 2020-08-28 DIAGNOSIS — F411 Generalized anxiety disorder: Secondary | ICD-10-CM

## 2020-08-28 DIAGNOSIS — M25511 Pain in right shoulder: Secondary | ICD-10-CM

## 2020-08-28 DIAGNOSIS — G4700 Insomnia, unspecified: Secondary | ICD-10-CM | POA: Diagnosis not present

## 2020-08-28 DIAGNOSIS — D571 Sickle-cell disease without crisis: Secondary | ICD-10-CM

## 2020-08-28 DIAGNOSIS — G894 Chronic pain syndrome: Secondary | ICD-10-CM | POA: Diagnosis not present

## 2020-08-28 MED ORDER — OXYCODONE HCL 20 MG PO TABS
1.0000 | ORAL_TABLET | Freq: Four times a day (QID) | ORAL | 0 refills | Status: DC | PRN
Start: 2020-08-28 — End: 2020-09-11

## 2020-08-28 MED ORDER — BUSPIRONE HCL 5 MG PO TABS: 5.0000 mg | ORAL_TABLET | Freq: Two times a day (BID) | ORAL | 1 refills | Status: DC

## 2020-08-28 MED ORDER — TRAZODONE HCL 150 MG PO TABS: 150.0000 mg | ORAL_TABLET | Freq: Every day | ORAL | 6 refills | Status: DC

## 2020-08-28 NOTE — Patient Instructions (Addendum)
Shoulder Exercises Ask your health care provider which exercises are safe for you. Do exercises exactly as told by your health care provider and adjust them as directed. It is normal to feel mild stretching, pulling, tightness, or discomfort as you do these exercises. Stop right away if you feel sudden pain or your pain gets worse. Do not begin these exercises until told by your health care provider. Stretching exercises External rotation and abduction This exercise is sometimes called corner stretch. This exercise rotates your arm outward (external rotation) and moves your arm out from your body (abduction). Stand in a doorway with one of your feet slightly in front of the other. This is called a staggered stance. If you cannot reach your forearms to the door frame, stand facing a corner of a room. Choose one of the following positions as told by your health care provider: Place your hands and forearms on the door frame above your head. Place your hands and forearms on the door frame at the height of your head. Place your hands on the door frame at the height of your elbows. Slowly move your weight onto your front foot until you feel a stretch across your chest and in the front of your shoulders. Keep your head and chest upright and keep your abdominal muscles tight. Hold for __________ seconds. To release the stretch, shift your weight to your back foot. Repeat __________ times. Complete this exercise __________ times a day.   Extension, standing Stand and hold a broomstick, a cane, or a similar object behind your back. Your hands should be a little wider than shoulder width apart. Your palms should face away from your back. Keeping your elbows straight and your shoulder muscles relaxed, move the stick away from your body until you feel a stretch in your shoulders (extension). Avoid shrugging your shoulders while you move the stick. Keep your shoulder blades tucked down toward the middle of your  back. Hold for __________ seconds. Slowly return to the starting position. Repeat __________ times. Complete this exercise __________ times a day. Range-of-motion exercises Pendulum Stand near a wall or a surface that you can hold onto for balance. Bend at the waist and let your left / right arm hang straight down. Use your other arm to support you. Keep your back straight and do not lock your knees. Relax your left / right arm and shoulder muscles, and move your hips and your trunk so your left / right arm swings freely. Your arm should swing because of the motion of your body, not because you are using your arm or shoulder muscles. Keep moving your hips and trunk so your arm swings in the following directions, as told by your health care provider: Side to side. Forward and backward. In clockwise and counterclockwise circles. Continue each motion for __________ seconds, or for as long as told by your health care provider. Slowly return to the starting position. Repeat __________ times. Complete this exercise __________ times a day.   Shoulder flexion, standing Stand and hold a broomstick, a cane, or a similar object. Place your hands a little more than shoulder width apart on the object. Your left / right hand should be palm up, and your other hand should be palm down. Keep your elbow straight and your shoulder muscles relaxed. Push the stick up with your healthy arm to raise your left / right arm in front of your body, and then over your head until you feel a stretch in your shoulder (  flexion). Avoid shrugging your shoulder while you raise your arm. Keep your shoulder blade tucked down toward the middle of your back. Hold for __________ seconds. Slowly return to the starting position. Repeat __________ times. Complete this exercise __________ times a day.   Shoulder abduction, standing Stand and hold a broomstick, a cane, or a similar object. Place your hands a little more than shoulder  width apart on the object. Your left / right hand should be palm up, and your other hand should be palm down. Keep your elbow straight and your shoulder muscles relaxed. Push the object across your body toward your left / right side. Raise your left / right arm to the side of your body (abduction) until you feel a stretch in your shoulder. Do not raise your arm above shoulder height unless your health care provider tells you to do that. If directed, raise your arm over your head. Avoid shrugging your shoulder while you raise your arm. Keep your shoulder blade tucked down toward the middle of your back. Hold for __________ seconds. Slowly return to the starting position. Repeat __________ times. Complete this exercise __________ times a day. Internal rotation Place your left / right hand behind your back, palm up. Use your other hand to dangle an exercise band, a towel, or a similar object over your shoulder. Grasp the band with your left / right hand so you are holding on to both ends. Gently pull up on the band until you feel a stretch in the front of your left / right shoulder. The movement of your arm toward the center of your body is called internal rotation. Avoid shrugging your shoulder while you raise your arm. Keep your shoulder blade tucked down toward the middle of your back. Hold for __________ seconds. Release the stretch by letting go of the band and lowering your hands. Repeat __________ times. Complete this exercise __________ times a day.   Strengthening exercises External rotation Sit in a stable chair without armrests. Secure an exercise band to a stable object at elbow height on your left / right side. Place a soft object, such as a folded towel or a small pillow, between your left / right upper arm and your body to move your elbow about 4 inches (10 cm) away from your side. Hold the end of the exercise band so it is tight and there is no slack. Keeping your elbow pressed  against the soft object, slowly move your forearm out, away from your abdomen (external rotation). Keep your body steady so only your forearm moves. Hold for __________ seconds. Slowly return to the starting position. Repeat __________ times. Complete this exercise __________ times a day.   Shoulder abduction Sit in a stable chair without armrests, or stand up. Hold a __________ weight in your left / right hand, or hold an exercise band with both hands. Start with your arms straight down and your left / right palm facing in, toward your body. Slowly lift your left / right hand out to your side (abduction). Do not lift your hand above shoulder height unless your health care provider tells you that this is safe. Keep your arms straight. Avoid shrugging your shoulder while you do this movement. Keep your shoulder blade tucked down toward the middle of your back. Hold for __________ seconds. Slowly lower your arm, and return to the starting position. Repeat __________ times. Complete this exercise __________ times a day.   Shoulder extension Sit in a stable chair without armrests,  or stand up. Secure an exercise band to a stable object in front of you so it is at shoulder height. Hold one end of the exercise band in each hand. Your palms should face each other. Straighten your elbows and lift your hands up to shoulder height. Step back, away from the secured end of the exercise band, until the band is tight and there is no slack. Squeeze your shoulder blades together as you pull your hands down to the sides of your thighs (extension). Stop when your hands are straight down by your sides. Do not let your hands go behind your body. Hold for __________ seconds. Slowly return to the starting position. Repeat __________ times. Complete this exercise __________ times a day. Shoulder row Sit in a stable chair without armrests, or stand up. Secure an exercise band to a stable object in front of you so  it is at waist height. Hold one end of the exercise band in each hand. Position your palms so that your thumbs are facing the ceiling (neutral position). Bend each of your elbows to a 90-degree angle (right angle) and keep your upper arms at your sides. Step back until the band is tight and there is no slack. Slowly pull your elbows back behind you. Hold for __________ seconds. Slowly return to the starting position. Repeat __________ times. Complete this exercise __________ times a day. Shoulder press-ups Sit in a stable chair that has armrests. Sit upright, with your feet flat on the floor. Put your hands on the armrests so your elbows are bent and your fingers are pointing forward. Your hands should be about even with the sides of your body. Push down on the armrests and use your arms to lift yourself off the chair. Straighten your elbows and lift yourself up as much as you comfortably can. Move your shoulder blades down, and avoid letting your shoulders move up toward your ears. Keep your feet on the ground. As you get stronger, your feet should support less of your body weight as you lift yourself up. Hold for __________ seconds. Slowly lower yourself back into the chair. Repeat __________ times. Complete this exercise __________ times a day.   Wall push-ups Stand so you are facing a stable wall. Your feet should be about one arm-length away from the wall. Lean forward and place your palms on the wall at shoulder height. Keep your feet flat on the floor as you bend your elbows and lean forward toward the wall. Hold for __________ seconds. Straighten your elbows to push yourself back to the starting position. Repeat __________ times. Complete this exercise __________ times a day.   This information is not intended to replace advice given to you by your health care provider. Make sure you discuss any questions you have with your health care provider. Document Revised: 09/30/2018 Document  Reviewed: 07/08/2018 Elsevier Patient Education  2021 [employer]. Shoulder Pain Many things can cause shoulder pain, including:  An injury to the shoulder.  Overuse of the shoulder.  Arthritis. The source of the pain can be:  Inflammation.  An injury to the shoulder joint.  An injury to a tendon, ligament, or bone. Follow these instructions at home: Pay attention to changes in your symptoms. Let your health care provider know about them. Follow these instructions to relieve your pain. If you have a sling:  Wear the sling as told by your health care provider. Remove it only as told by your health care provider.  Loosen the  sling if your fingers tingle, become numb, or turn cold and blue.  Keep the sling clean.  If the sling is not waterproof: ? Do not let it get wet. Remove it to shower or bathe.  Move your arm as little as possible, but keep your hand moving to prevent swelling. Managing pain, stiffness, and swelling  If directed, put ice on the painful area: ? Put ice in a plastic bag. ? Place a towel between your skin and the bag. ? Leave the ice on for 20 minutes, 2-3 times per day. Stop applying ice if it does not help with the pain.  Squeeze a soft ball or a foam pad as much as possible. This helps to keep the shoulder from swelling. It also helps to strengthen the arm.   General instructions  Take over-the-counter and prescription medicines only as told by your health care provider.  Keep all follow-up visits as told by your health care provider. This is important. Contact a health care provider if:  Your pain gets worse.  Your pain is not relieved with medicines.  New pain develops in your arm, hand, or fingers. Get help right away if:  Your arm, hand, or fingers: ? Tingle. ? Become numb. ? Become swollen. ? Become painful. ? Turn white or blue. Summary  Shoulder pain can be caused by an injury, overuse, or arthritis.  Pay attention to changes  in your symptoms. Let your health care provider know about them.  This condition may be treated with a sling, ice, and pain medicines.  Contact your health care provider if the pain gets worse or new pain develops. Get help right away if your arm, hand, or fingers tingle or become numb, swollen, or painful.  Keep all follow-up visits as told by your health care provider. This is important. This information is not intended to replace advice given to you by your health care provider. Make sure you discuss any questions you have with your health care provider. Document Revised: 12/21/2017 Document Reviewed: 12/21/2017 Elsevier Patient Education  2021 [employer].

## 2020-08-28 NOTE — Progress Notes (Signed)
[place], [place]  [address] Phone:  [phone]   Fax:  [phone]    Established Patient Office Visit  Subjective:  Patient ID: Alejandro Soto, male    DOB: 08-19-1985  Age: 35 y.o. MRN: [id number]  CC:  Chief Complaint  Patient presents with  . Sickle Cell Anemia    Right arm pain    HPI Alejandro Soto presents for follow up. He  has a past medical history of Insomnia (08/2019), Pneumonia, and Sickle cell anemia ([place]).     Shoulder Pain Patient complains of right shoulder pain. The symptoms began several weeks ago. Aggravating factors: no known event. Pain is located between the neck and shoulder. Discomfort is described as aching. Symptoms are exacerbated by overhead movements and lying on the shoulder. Evaluation to date: none. Therapy to date includes: rest, avoidance of offending activity, prescription NSAIDS which are somewhat effective and home exercises which are somewhat effective.   Past Medical History:  Diagnosis Date  . Insomnia 08/2019  . Pneumonia   . Sickle cell anemia ([facility])     Past Surgical History:  Procedure Laterality Date  . NO PAST SURGERIES      Family History  Problem Relation Age of Onset  . Cancer Maternal Grandmother        colon   . Diabetes Father   . Cancer Father 44       Prostate  . Asthma Brother     Social History   Socioeconomic History  . Marital status: Single    Spouse name: Not on file  . Number of children: Not on file  . Years of education: Not on file  . Highest education level: Not on file  Occupational History  . Not on file  Tobacco Use  . Smoking status: Current Some Day Smoker    Packs/day: 0.25    Years: 5.00    Pack years: 1.25    Types: Cigarettes  . Smokeless tobacco: Never Used  Vaping Use  . Vaping Use: Never used  Substance and Sexual Activity  . Alcohol use: No    Alcohol/week: 0.0 standard drinks  . Drug use: No  . Sexual activity: Yes     Birth control/protection: Condom  Other Topics Concern  . Not on file  Social History Narrative  . Not on file   Social Determinants of Health   Financial Resource Strain: Not on file  Food Insecurity: Not on file  Transportation Needs: Not on file  Physical Activity: Not on file  Stress: Not on file  Social Connections: Not on file  Intimate Partner Violence: Not on file    Outpatient Medications Prior to Visit  Medication Sig Dispense Refill  . folic acid (FOLVITE) 1 MG tablet Take 1 tablet (1 mg total) by mouth daily. 90 tablet 3  . gabapentin (NEURONTIN) 600 MG tablet Take 1 capsule, by mouth, 2 times a day. 60 tablet 6  . hydroxyurea (HYDREA) 500 MG capsule Take 3 capsules (1,500 mg total) by mouth daily. May take with food to minimize GI side effects. 270 capsule 3  . ibuprofen (ADVIL) 800 MG tablet Take 1 tablet (800 mg total) by mouth every 8 (eight) hours as needed for mild pain. 90 tablet 0  . Multiple Vitamin (MULTIVITAMIN) tablet Take 2 tablets by mouth daily.     . naloxegol oxalate (MOVANTIK) 25 MG TABS tablet Take 1 tablet (25 mg total) by mouth daily. 90 tablet  3  . REXULTI 1 MG TABS     . triamcinolone (KENALOG) 0.025 % ointment Apply 1 application topically 2 (two) times daily. 30 g 2  . busPIRone (BUSPAR) 5 MG tablet Take 1 tablet (5 mg total) by mouth 2 (two) times daily. 30 tablet 1  . Oxycodone HCl 20 MG TABS Take 1 tablet (20 mg total) by mouth 4 (four) times daily as needed for up to 15 days. 60 tablet 0  . traZODone (DESYREL) 150 MG tablet Take 1 tablet (150 mg total) by mouth at bedtime. 30 tablet 6   No facility-administered medications prior to visit.    Allergies  Allergen Reactions  . Amoxicillin Diarrhea    Did it involve swelling of the face/tongue/throat, SOB, or low BP? No Did it involve sudden or severe rash/hives, skin peeling, or any reaction on the inside of your mouth or nose? No Did you need to seek medical attention at a hospital or  doctor's office? No When did it last happen?childhood If all above answers are "NO", may proceed with cephalosporin use.     ROS Review of Systems  Respiratory: Negative for shortness of breath.   Cardiovascular: Negative for chest pain.      Objective:    Physical Exam Constitutional:      Appearance: He is not ill-appearing, toxic-appearing or diaphoretic.  HENT:     Head: Normocephalic and atraumatic.     Nose: Nose normal.     Mouth/Throat:     Mouth: Mucous membranes are moist.  Cardiovascular:     Rate and Rhythm: Normal rate and regular rhythm.     Pulses: Normal pulses.     Heart sounds: Normal heart sounds.  Pulmonary:     Effort: Pulmonary effort is normal.     Breath sounds: Normal breath sounds.  Abdominal:     Palpations: Abdomen is soft.  Musculoskeletal:        General: Swelling and tenderness present.     Cervical back: Normal range of motion.     Right lower leg: No edema.     Left lower leg: No edema.     Comments: Right shoulder  Skin:    General: Skin is warm.     Capillary Refill: Capillary refill takes less than 2 seconds.  Neurological:     General: No focal deficit present.     Mental Status: He is alert and oriented to person, place, and time.  Psychiatric:        Mood and Affect: Mood normal.        Behavior: Behavior normal.        Thought Content: Thought content normal.        Judgment: Judgment normal.     BP 109/68   Pulse 76   Ht 5\' 8"  (1.727 m)   Wt 186 lb (84.4 kg)   SpO2 99%   BMI 28.28 kg/m  Wt Readings from Last 3 Encounters:  08/28/20 186 lb (84.4 kg)  05/29/20 184 lb 12.8 oz (83.8 kg)  03/28/20 193 lb (87.5 kg)     Health Maintenance Due  Topic Date Due  . COVID-19 Vaccine (1) Never done    There are no preventive care reminders to display for this patient.  Lab Results  Component Value Date   TSH 0.78 09/11/2016   Lab Results  Component Value Date   WBC 17.6 (H) 07/31/2020   HGB 9.6 (L)  07/31/2020   HCT 27.7 (L) 07/31/2020  MCV 91.4 07/31/2020   PLT 556 (H) 07/31/2020   Lab Results  Component Value Date   NA 137 07/31/2020   K 3.9 07/31/2020   CO2 24 07/31/2020   GLUCOSE 104 (H) 07/31/2020   BUN 7 07/31/2020   CREATININE 0.58 (L) 07/31/2020   BILITOT 1.7 (H) 07/31/2020   ALKPHOS 75 07/31/2020   AST 44 (H) 07/31/2020   ALT 28 07/31/2020   PROT 7.9 07/31/2020   ALBUMIN 4.6 07/31/2020   CALCIUM 8.8 (L) 07/31/2020   ANIONGAP 10 07/31/2020   No results found for: CHOL No results found for: HDL No results found for: LDLCALC No results found for: TRIG No results found for: CHOLHDL Lab Results  Component Value Date   HGBA1C <4.0 09/07/2013      Assessment & Plan:   Problem List Items Addressed This Visit      Other   Insomnia Stable refill on trazodone   Relevant Medications   traZODone (DESYREL) 150 MG tablet   Sickle cell disease, type SS ([place]) - Primary Ensure adequate hydration. Move frequently to reduce venous thromboembolism risk. Avoid situations that could lead to dehydration or could exacerbate pain Discussed S&S of infection, seizures, stroke acute chest, DVT and how important it is to seek medical attention Take medication as directed along with pain contract and overall compliance Discussed the risk related to opiate use (addition, tolerance and dependency)     Relevant Orders   POCT urinalysis dipstick   CBC with Differential/Platelet   Comp. Metabolic Panel (12)    Other Visit Diagnoses    Acute pain of right shoulder     Persistent encouraged x-ray for evaluation due to sickle cell disease Home exercises provided Medications refilled as previous   Relevant Orders   DG Shoulder Right   Chronic pain syndrome       Relevant Medications   Oxycodone HCl 20 MG TABS   traZODone (DESYREL) 150 MG tablet   Generalized anxiety disorder     Stable refill on BuSpar   Relevant Medications   traZODone (DESYREL) 150 MG tablet    busPIRone (BUSPAR) 5 MG tablet      Meds ordered this encounter  Medications  . Oxycodone HCl 20 MG TABS    Sig: Take 1 tablet (20 mg total) by mouth 4 (four) times daily as needed for up to 15 days.    Dispense:  60 tablet    Refill:  0    Refill date 08/13/2020    Order Specific Question:   Supervising Provider    Answer:   Tresa Garter [[id number]]  . traZODone (DESYREL) 150 MG tablet    Sig: Take 1 tablet (150 mg total) by mouth at bedtime.    Dispense:  30 tablet    Refill:  6    Order Specific Question:   Supervising Provider    Answer:   Tresa Garter [id number]  . busPIRone (BUSPAR) 5 MG tablet    Sig: Take 1 tablet (5 mg total) by mouth 2 (two) times daily.    Dispense:  30 tablet    Refill:  1    Order Specific Question:   Supervising Provider    Answer:   Tresa Garter [id number]    Follow-up: Return in about 3 months (around 11/28/2020) for Follow up SCD [address].    Vevelyn Francois, NP

## 2020-08-29 LAB — COMP. METABOLIC PANEL (12)
AST: 46 IU/L — ABNORMAL HIGH (ref 0–40)
Albumin/Globulin Ratio: 1.5 (ref 1.2–2.2)
Albumin: 5.1 g/dL — ABNORMAL HIGH (ref 4.0–5.0)
Alkaline Phosphatase: 87 IU/L (ref 44–121)
BUN/Creatinine Ratio: 12 (ref 9–20)
BUN: 9 mg/dL (ref 6–20)
Bilirubin Total: 1.4 mg/dL — ABNORMAL HIGH (ref 0.0–1.2)
Calcium: 9.5 mg/dL (ref 8.7–10.2)
Chloride: 100 mmol/L (ref 96–106)
Creatinine, Ser: 0.77 mg/dL (ref 0.76–1.27)
Globulin, Total: 3.4 g/dL (ref 1.5–4.5)
Glucose: 70 mg/dL (ref 65–99)
Potassium: 4.7 mmol/L (ref 3.5–5.2)
Sodium: 136 mmol/L (ref 134–144)
Total Protein: 8.5 g/dL (ref 6.0–8.5)
eGFR: 120 mL/min/{1.73_m2} (ref >59)

## 2020-08-29 LAB — CBC WITH DIFFERENTIAL/PLATELET
Basophils Absolute: 0.1 10*3/uL (ref 0.0–0.2)
Basos: 1 %
EOS (ABSOLUTE): 0.6 10*3/uL — ABNORMAL HIGH (ref 0.0–0.4)
Eos: 3 %
Hematocrit: 30.4 % — ABNORMAL LOW (ref 37.5–51.0)
Hemoglobin: 10.2 g/dL — ABNORMAL LOW (ref 13.0–17.7)
Immature Grans (Abs): 0.1 10*3/uL (ref 0.0–0.1)
Immature Granulocytes: 1 %
Lymphocytes Absolute: 6.2 10*3/uL — ABNORMAL HIGH (ref 0.7–3.1)
Lymphs: 28 %
MCH: 30.9 pg (ref 26.6–33.0)
MCHC: 33.6 g/dL (ref 31.5–35.7)
MCV: 92 fL (ref 79–97)
Monocytes Absolute: 1.6 10*3/uL — ABNORMAL HIGH (ref 0.1–0.9)
Monocytes: 7 %
NRBC: 1 % — ABNORMAL HIGH (ref 0–0)
Neutrophils Absolute: 13.5 10*3/uL — ABNORMAL HIGH (ref 1.4–7.0)
Neutrophils: 60 %
Platelets: 689 10*3/uL — ABNORMAL HIGH (ref 150–450)
RBC: 3.3 x10E6/uL — ABNORMAL LOW (ref 4.14–5.80)
RDW: 17 % — ABNORMAL HIGH (ref 11.6–15.4)
WBC: 22.2 10*3/uL (ref 3.4–10.8)

## 2020-08-30 ENCOUNTER — Other Ambulatory Visit: Payer: Self-pay | Admitting: Nurse Practitioner

## 2020-08-30 DIAGNOSIS — F411 Generalized anxiety disorder: Secondary | ICD-10-CM

## 2020-09-10 ENCOUNTER — Telehealth: Payer: Self-pay

## 2020-09-10 NOTE — Telephone Encounter (Signed)
Med refill Oxycodone 20 mg

## 2020-09-11 ENCOUNTER — Other Ambulatory Visit: Payer: Self-pay | Admitting: Nurse Practitioner

## 2020-09-11 MED ORDER — OXYCODONE HCL 20 MG PO TABS: 1.0000 | ORAL_TABLET | Freq: Four times a day (QID) | ORAL | 0 refills | Status: DC | PRN

## 2020-09-11 NOTE — Telephone Encounter (Signed)
Sent!

## 2020-09-26 ENCOUNTER — Telehealth: Payer: Self-pay

## 2020-09-26 NOTE — Telephone Encounter (Signed)
Med refill Oxy 20 mg

## 2020-09-27 ENCOUNTER — Other Ambulatory Visit: Payer: Self-pay | Admitting: Nurse Practitioner

## 2020-09-27 MED ORDER — OXYCODONE HCL 20 MG PO TABS: 1.0000 | ORAL_TABLET | Freq: Four times a day (QID) | ORAL | 0 refills | Status: DC | PRN

## 2020-09-27 NOTE — Telephone Encounter (Signed)
Pt call again for med refill Oxycodone 20 mg

## 2020-09-27 NOTE — Telephone Encounter (Signed)
sent 

## 2020-10-10 ENCOUNTER — Telehealth: Payer: Self-pay

## 2020-10-10 NOTE — Telephone Encounter (Signed)
Med refill Oxycodone 10 mg

## 2020-10-11 ENCOUNTER — Other Ambulatory Visit: Payer: Self-pay | Admitting: Nurse Practitioner

## 2020-10-11 DIAGNOSIS — Z79891 Long term (current) use of opiate analgesic: Secondary | ICD-10-CM

## 2020-10-11 DIAGNOSIS — G894 Chronic pain syndrome: Secondary | ICD-10-CM

## 2020-10-11 DIAGNOSIS — D571 Sickle-cell disease without crisis: Secondary | ICD-10-CM

## 2020-10-11 MED ORDER — OXYCODONE HCL 20 MG PO TABS: 1.0000 | ORAL_TABLET | Freq: Four times a day (QID) | ORAL | 0 refills | Status: DC | PRN

## 2020-10-11 NOTE — Telephone Encounter (Signed)
sent 

## 2020-10-24 ENCOUNTER — Telehealth: Payer: Self-pay

## 2020-10-24 NOTE — Telephone Encounter (Signed)
Med refill Oxycodone 20 mg 

## 2020-10-25 ENCOUNTER — Other Ambulatory Visit: Payer: Self-pay | Admitting: Nurse Practitioner

## 2020-10-25 MED ORDER — OXYCODONE HCL 20 MG PO TABS: 1.0000 | ORAL_TABLET | Freq: Four times a day (QID) | ORAL | 0 refills | Status: DC | PRN

## 2020-10-25 NOTE — Telephone Encounter (Signed)
sent 

## 2020-10-25 NOTE — Progress Notes (Signed)
   Alejandro Soto, [place]  [address] Phone:  [phone]   Fax:  [phone] PDMP reviewed during this encounter.Subjective:    Alejandro Soto calls for refill of his  oxycodone    Objective:   Indication for chronic opioid: CPS related to SCD Medication and dose: Oxycodone 20 mg  # pills per month: 120 Last UDS date: 12/21 Opioid Treatment Agreement signed (Y/N): y Opioid Treatment Agreement last reviewed with patient:  2021 [place] reviewed this encounter (include red flags): Yes   Assessment:   Chronic Pain Syndrome related to Sickle Cell Disease    Plan:   Oxycodone 20 mg refilled  Return for follow-up appointment as scheduled.

## 2020-10-28 ENCOUNTER — Telehealth: Payer: Self-pay

## 2020-10-28 NOTE — Telephone Encounter (Signed)
[id number] PA Type:PHARMACYRecipient:Desi L COWANRecipient VO:[phone] NBilling Provider:Billing Provider YI:[id number] Provider Name:CRYSTAL Suan Halter Provider JK:[id number] Submission Date:05/09/2022Status:APPROVEDEffective Begin Date:05/09/2022Effective End Date:11/05/2022Payer:[place] [employer]# of Attachments:1PA Documents:View Documents Attachments Attachment Type Attachment Control # Transmission Code MED REC  716  UPLOAD Back to top Line Item 1 Status:APPROVEDDrug Name/Code:OXY TABDrug Code Type:DRUG NAMERequestedLength of Therapy:180 DAYSTotal RCVELFYB:017.510CHENIDPO:EUMPNTIRWERXVQ of Therapy:180 DAYSTotal MGQQPYPP:509.326ZTIW to top Prior Approval Attachment

## 2020-10-28 NOTE — Telephone Encounter (Signed)
Sent PA through [place] , waiting on MCD to Approval oxycodone , still pending

## 2020-10-28 NOTE — Telephone Encounter (Signed)
error 

## 2020-11-08 ENCOUNTER — Telehealth: Payer: Self-pay | Admitting: Nurse Practitioner

## 2020-11-08 ENCOUNTER — Other Ambulatory Visit: Payer: Self-pay | Admitting: Nurse Practitioner

## 2020-11-08 MED ORDER — OXYCODONE HCL 20 MG PO TABS
1.0000 | ORAL_TABLET | Freq: Four times a day (QID) | ORAL | 0 refills | Status: DC | PRN
Start: 2020-11-12 — End: 2020-11-26

## 2020-11-08 NOTE — Telephone Encounter (Signed)
done

## 2020-11-08 NOTE — Telephone Encounter (Signed)
sent 

## 2020-11-25 ENCOUNTER — Telehealth: Payer: Self-pay

## 2020-11-25 NOTE — Telephone Encounter (Signed)
Oxycodone 20 mg °

## 2020-11-26 ENCOUNTER — Other Ambulatory Visit: Payer: Self-pay | Admitting: Nurse Practitioner

## 2020-11-26 MED ORDER — OXYCODONE HCL 20 MG PO TABS: 1.0000 | ORAL_TABLET | Freq: Four times a day (QID) | ORAL | 0 refills | Status: DC | PRN

## 2020-11-26 NOTE — Progress Notes (Unsigned)
   [facility] [address] [place],   [address] Phone:  [phone]   Fax:  [phone] 

## 2020-11-26 NOTE — Progress Notes (Signed)
   [facility] [address] [place], [place]  [address] Phone:  [phone]   Fax:  [phone] 

## 2020-11-26 NOTE — Telephone Encounter (Signed)
sent 

## 2020-11-28 ENCOUNTER — Telehealth (INDEPENDENT_AMBULATORY_CARE_PROVIDER_SITE_OTHER): Payer: Medicaid Other | Admitting: Nurse Practitioner

## 2020-11-28 ENCOUNTER — Encounter: Payer: Self-pay | Admitting: Nurse Practitioner

## 2020-11-28 ENCOUNTER — Other Ambulatory Visit: Payer: Self-pay

## 2020-11-28 DIAGNOSIS — G8929 Other chronic pain: Secondary | ICD-10-CM

## 2020-11-28 DIAGNOSIS — D571 Sickle-cell disease without crisis: Secondary | ICD-10-CM

## 2020-11-28 DIAGNOSIS — M25511 Pain in right shoulder: Secondary | ICD-10-CM | POA: Diagnosis not present

## 2020-11-28 DIAGNOSIS — G4700 Insomnia, unspecified: Secondary | ICD-10-CM

## 2020-11-28 DIAGNOSIS — G894 Chronic pain syndrome: Secondary | ICD-10-CM | POA: Diagnosis not present

## 2020-11-28 MED ORDER — TRAZODONE HCL 150 MG PO TABS: 150.0000 mg | ORAL_TABLET | Freq: Every day | ORAL | 11 refills | Status: DC

## 2020-11-28 MED ORDER — GABAPENTIN 600 MG PO TABS: 600.0000 mg | ORAL_TABLET | Freq: Two times a day (BID) | ORAL | 11 refills | Status: DC

## 2020-11-28 NOTE — Progress Notes (Signed)
   [place], [place]  [address] Phone:  [phone]   Fax:  [phone] Virtual Visit via telephone Note  I connected with Alejandro Soto on 11/28/20 at 11:20 AM EDT by video and verified that I am speaking with the correct person using two identifiers.   I discussed the limitations, risks, security and privacy concerns of performing an evaluation and management service by telephone and the availability of in person appointments. I also discussed with the patient that there may be a patient responsible charge related to this service. The patient expressed understanding and agreed to proceed.  Patient home Provider home  History of Present Illness:  He  has a past medical history of Insomnia (08/2019), Pneumonia, and Sickle cell anemia ([place]).  He is continues to have right shoulder pain. He is left hand dominant. He has tried to tough it out but it continues. He reports occasional 10/10 pain. He reports any movement can cause pain. He has tries exercises however they seemed to make the pain worse. He has not completed the xray. He denies numbness or tingling. Denies fever, headache, cough, wheezing, shortness of breath, chest pains, abdominal pain, back pain, hip pain, or leg pain. Denies any open wounds, skin irritation.   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 Diagnoses of Chronic right shoulder pain, Chronic pain syndrome, Hb-SS disease without crisis ([place]), and Insomnia, unspecified type were also pertinent to this visit.  Visit Diagnosis: 1. 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 Move frequently to reduce venous thromboembolism risk. Avoid situations that could lead to dehydration or could exacerbate pain Discussed S&S of infection, seizures,  stroke acute chest, DVT and how important it is to seek medical attention Take medication as directed along with pain contract and overall compliance Discussed the risk related to opiate use (addition, tolerance and dependency)   2. Chronic right shoulder pain  Persistent encourage completion of xray PT referral   3. Chronic pain syndrome   4. Hb-SS disease without crisis ([place])   5. Insomnia, unspecified type      Follow Up Instructions: 3 mo   I discussed the assessment and treatment plan with the patient. The patient was provided an opportunity to ask questions and all were answered. The patient agreed with the plan and demonstrated an understanding of the instructions.   The patient was advised to call back or seek an in-person evaluation if the symptoms worsen or if the condition fails to improve as anticipated.  I provided 12 minutes of telephone visit time during this encounter.   Vevelyn Francois, NP

## 2020-11-28 NOTE — Patient Instructions (Signed)
Sickle Cell Anemia, Adult  Sickle cell anemia is a condition where your red blood cells are shaped like sickles. Red blood cells carry oxygen through the body. Sickle-shaped cells do not live as long as normal red blood cells. They also clump together and block blood from flowing through the blood vessels. This prevents the body from getting enough oxygen. Sickle cell anemia causes organ damage and pain. It also increases the risk of infection. Follow these instructions at home: Medicines  Take over-the-counter and prescription medicines only as told by your doctor.  If you were prescribed an antibiotic medicine, take it as told by your doctor. Do not stop taking the antibiotic even if you start to feel better.  If you develop a fever, do not take medicines to lower the fever right away. Tell your doctor about the fever. Managing pain, stiffness, and swelling  Try these methods to help with pain: ? Use a heating pad. ? Take a warm bath. ? Distract yourself, such as by watching TV. Eating and drinking  Drink enough fluid to keep your pee (urine) clear or pale yellow. Drink more in hot weather and during exercise.  Limit or avoid alcohol.  Eat a healthy diet. Eat plenty of fruits, vegetables, whole grains, and lean protein.  Take vitamins and supplements as told by your doctor. Traveling  When traveling, keep these with you: ? Your medical information. ? The names of your doctors. ? Your medicines.  If you need to take an airplane, talk to your doctor first. Activity  Rest often.  Avoid exercises that make your heart beat much faster, such as jogging. General instructions  Do not use products that have nicotine or tobacco, such as cigarettes and e-cigarettes. If you need help quitting, ask your doctor.  Consider wearing a medical alert bracelet.  Avoid being in high places (high altitudes), such as mountains.  Avoid very hot or cold temperatures.  Avoid places where the  temperature changes a lot.  Keep all follow-up visits as told by your doctor. This is important. Contact a doctor if:  A joint hurts.  Your feet or hands hurt or swell.  You feel tired (fatigued). Get help right away if:  You have symptoms of infection. These include: ? Fever. ? Chills. ? Being very tired. ? Irritability. ? Poor eating. ? Throwing up (vomiting).  You feel dizzy or faint.  You have new stomach pain, especially on the left side.  You have a an erection (priapism) that lasts more than 4 hours.  You have numbness in your arms or legs.  You have a hard time moving your arms or legs.  You have trouble talking.  You have pain that does not go away when you take medicine.  You are short of breath.  You are breathing fast.  You have a long-term cough.  You have pain in your chest.  You have a bad headache.  You have a stiff neck.  Your stomach looks bloated even though you did not eat much.  Your skin is pale.  You suddenly cannot see well. Summary  Sickle cell anemia is a condition where your red blood cells are shaped like sickles.  Follow your doctor's advice on ways to manage pain, food to eat, activities to do, and steps to take for safe travel.  Get medical help right away if you have any signs of infection, such as a fever. This information is not intended to replace advice given to you by   your health care provider. Make sure you discuss any questions you have with your health care provider. Document Revised: 11/02/2019 Document Reviewed: 11/02/2019 Elsevier Patient Education  2021 Elsevier Inc.  

## 2020-11-29 ENCOUNTER — Telehealth: Payer: Self-pay

## 2020-11-29 NOTE — Telephone Encounter (Signed)
Lm on v/m for pt to c/b asnd r/s 3 mos f/u per [employer]

## 2020-12-10 ENCOUNTER — Telehealth: Payer: Self-pay

## 2020-12-10 NOTE — Telephone Encounter (Signed)
Oxycodone 20 mg °

## 2020-12-11 ENCOUNTER — Other Ambulatory Visit: Payer: Self-pay | Admitting: Nurse Practitioner

## 2020-12-11 MED ORDER — OXYCODONE HCL 20 MG PO TABS: 1.0000 | ORAL_TABLET | Freq: Four times a day (QID) | ORAL | 0 refills | Status: DC | PRN

## 2020-12-16 ENCOUNTER — Ambulatory Visit: Payer: Medicaid Other | Admitting: Physical Therapy

## 2020-12-26 ENCOUNTER — Telehealth: Payer: Self-pay

## 2020-12-26 NOTE — Telephone Encounter (Signed)
Oxycodone 20 mg °

## 2020-12-27 ENCOUNTER — Other Ambulatory Visit: Payer: Self-pay | Admitting: Nurse Practitioner

## 2020-12-27 MED ORDER — OXYCODONE HCL 20 MG PO TABS: 1.0000 | ORAL_TABLET | Freq: Four times a day (QID) | ORAL | 0 refills | Status: DC | PRN

## 2020-12-30 ENCOUNTER — Other Ambulatory Visit: Payer: Self-pay

## 2020-12-30 ENCOUNTER — Ambulatory Visit: Payer: Medicaid Other | Attending: Nurse Practitioner | Admitting: Rehabilitative and Restorative Service Providers"

## 2020-12-30 ENCOUNTER — Encounter: Payer: Self-pay | Admitting: Rehabilitative and Restorative Service Providers"

## 2020-12-30 DIAGNOSIS — M6281 Muscle weakness (generalized): Secondary | ICD-10-CM | POA: Diagnosis present

## 2020-12-30 DIAGNOSIS — G8929 Other chronic pain: Secondary | ICD-10-CM | POA: Diagnosis present

## 2020-12-30 DIAGNOSIS — M25511 Pain in right shoulder: Secondary | ICD-10-CM | POA: Diagnosis present

## 2020-12-30 DIAGNOSIS — R293 Abnormal posture: Secondary | ICD-10-CM | POA: Diagnosis present

## 2020-12-30 DIAGNOSIS — Z7689 Persons encountering health services in other specified circumstances: Secondary | ICD-10-CM | POA: Diagnosis not present

## 2020-12-30 DIAGNOSIS — M25611 Stiffness of right shoulder, not elsewhere classified: Secondary | ICD-10-CM | POA: Insufficient documentation

## 2020-12-30 NOTE — Therapy (Signed)
[facility] [address]  [place], [place], [address] Phone: [phone]   Fax:  [phone]  Physical Therapy Evaluation  Patient Details  Name: Alejandro Soto MRN: [id number] Date of Birth: 08-11-1985 Referring Provider (PT): Dionisio David, NP   Encounter Date: 12/30/2020   PT End of Session - 12/30/20 1140     Visit Number 1    Date for PT Re-Evaluation 02/21/21    Authorization Type Medicaid    PT Start Time 1100    PT Stop Time 1145    PT Time Calculation (min) 45 min    Activity Tolerance Patient tolerated treatment well    Behavior During Therapy [facility] for tasks assessed/performed             Past Medical History:  Diagnosis Date   Insomnia 08/2019   Pneumonia    Sickle cell anemia ([place])     Past Surgical History:  Procedure Laterality Date   NO PAST SURGERIES      There were no vitals filed for this visit.    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  He states that the pain has stayed essentially the same since that time.    Pertinent History Sickle Cell Anemia, Schizophrenia    Limitations Lifting    Patient Stated Goals To decrease pain    Currently in Pain? Yes    Pain Score 7     Pain Location Shoulder    Pain Orientation Right    Pain Descriptors / Indicators Aching;Throbbing    Pain Type Chronic pain    Pain Onset More than a month ago    Pain Frequency Intermittent    Aggravating Factors  certain movements    Pain Relieving Factors rest                [facility] PT Assessment - 12/30/20 0001       Assessment   Medical Diagnosis R shoulder pain    Referring Provider (PT) Dionisio David, NP    Hand Dominance Left    Next MD Visit 03/03/2021    Prior Therapy no      Precautions   Precautions None      Restrictions   Weight Bearing Restrictions No       Balance Screen   Has the patient fallen in the past 6 months No    Has the patient had a decrease in activity level because of a fear of falling?  No    Is the patient reluctant to leave their home because of a fear of falling?  No      Home Environment   Living Environment Private residence    Living Arrangements Parent    Type of [place] Two level      Prior Function   Level of Independence Independent    Vocation Part time employment    Vocation Requirements [place] going to the gym, basketball, throwing football around      [employer]   Posture/Postural Control Postural limitations    Postural Limitations Rounded Shoulders;Forward head      ROM / Strength   AROM / PROM / Strength AROM;PROM;Strength      AROM   AROM Assessment Site Shoulder    Right/Left  Shoulder Right    Right Shoulder Flexion 140 Degrees    Right Shoulder ABduction 135 Degrees      PROM   PROM Assessment Site Shoulder    Right/Left Shoulder Right    Right Shoulder Flexion 145 Degrees    Right Shoulder ABduction 145 Degrees      Strength   Overall Strength Comments R shoulder strength of 4/5 grossly throughout                        Objective measurements completed on examination: See above findings.       [place] Adult PT Treatment/Exercise - 12/30/20 0001       Exercises   Exercises Shoulder      Shoulder Exercises: Seated   Retraction Strengthening;Both;10 reps    Horizontal ABduction Strengthening;Both;10 reps    Theraband Level (Shoulder Horizontal ABduction) Level 2 (Red)    External Rotation Strengthening;Both;10 reps    Theraband Level (Shoulder External Rotation) Level 2 (Red)    Flexion Strengthening;Right;10 reps    Theraband Level (Shoulder Flexion) Level 2 (Red)    Abduction Strengthening;Right;10 reps    Theraband Level (Shoulder ABduction) Level 2 (Red)      Modalities   Modalities Moist Heat;Electrical  Stimulation      Moist Heat Therapy   Number Minutes Moist Heat 10 Minutes    Moist Heat Location Shoulder      Electrical Stimulation   Electrical Stimulation Location R shoulder    Electrical Stimulation Action IFC    Electrical Stimulation Parameters in sitting x10 min    Electrical Stimulation Goals Pain                    PT Education - 12/30/20 1137     Education Details Pt provided with HEP and red theraband    Person(s) Educated Patient    Methods Explanation;Demonstration;Handout    Comprehension Verbalized understanding;Returned demonstration              PT Short Term Goals - 12/30/20 1203       PT SHORT TERM GOAL #1   Title Pt will be independent with initial HEP.    Time 3    Period Weeks    Status New               PT Long Term Goals - 12/30/20 1203       PT LONG TERM GOAL #1   Title Patient will be independent with advanced HEP.    Time 8    Period Weeks    Status New      PT LONG TERM GOAL #2   Title Patient will have AROM that is [facility] without an increase in pain to allow him to cut hair.    Time 8    Period Weeks    Status New      PT LONG TERM GOAL #3   Title Patient will increase R shoulder strength to at least 4+/5 to allow him to return to social activivies.    Time 8    Period Weeks    Status New      PT LONG TERM GOAL #4   Title Patient will report at least a 75% improvement in symptoms.    Time 8    Period Weeks    Status New                    Plan - 12/30/20  [place]     Clinical Impression Statement [name] presents to outpatient PT with a referral for R shoulder pain from Dionisio David, NP.  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  He presents with R shoulder pain, R shoulder stiffness, muscle weakness, decreased postural awareness.  Patient states that he does not have a car, so transportation may be a concern for him, but he will come as much as he can.  He was educated with HEP and  provided with red theraband.  Pt reports a relief of pain with use of e-stim.  He would benefit from skilled PT to address his functional impairments to address his functional impairments to allow him to return to painfree lifestyle.    Personal Factors and Comorbidities Transportation    Examination-Activity Limitations Reach Overhead    Examination-Participation Restrictions Occupation    Stability/Clinical Decision Making Stable/Uncomplicated    Clinical Decision Making Low    Rehab Potential Good    PT Frequency 2x / week    PT Duration 8 weeks    PT Treatment/Interventions ADLs/Self Care Home Management;Cryotherapy;Electrical Stimulation;Iontophoresis 4mg /ml Dexamethasone;Moist Heat;Ultrasound;Functional mobility training;Therapeutic activities;Therapeutic exercise;Neuromuscular re-education;Patient/family education;Passive range of motion;Dry needling;Taping;Vasopneumatic Device    PT Next Visit Plan assess HEP and progress as needed.  strengthening, postural re-education    PT Home Exercise Plan Access Code: [email or web address]    Consulted and Agree with Plan of Care Patient             Patient will benefit from skilled therapeutic intervention in order to improve the following deficits and impairments:  Decreased range of motion, Increased muscle spasms, Pain, Improper body mechanics, Decreased strength, Postural dysfunction  Visit Diagnosis: Chronic right shoulder pain - Plan: PT plan of care cert/re-cert  Stiffness of right shoulder, not elsewhere classified - Plan: PT plan of care cert/re-cert  Muscle weakness (generalized) - Plan: PT plan of care cert/re-cert  Abnormal posture - Plan: PT plan of care cert/re-cert     Problem List Patient Active Problem List   Diagnosis Date Noted   Insomnia 03/23/2019   Chronic prescription opiate use 03/23/2019   Methadone use 08/12/2015   Chronic back pain greater than 3 months duration 04/09/2015   Abdominal pain, epigastric 12/24/2014    Sickle cell pain crisis ([place]) 12/16/2014   Thrombocytosis ([place]) 07/14/2014   Anemia 07/14/2014   Sore throat 07/03/2014   Sebaceous cyst 07/03/2014   Atopic dermatitis 06/12/2014   Leukocytosis 05/02/2014   Tobacco dependence 04/25/2014   Neck pain, bilateral 03/01/2014   Hb-SS disease without crisis ([place]) 02/22/2014   DJD (degenerative joint disease), lumbosacral 09/18/2013   Neuropathic pain 09/18/2013   Back pain, acute 08/11/2013   Opiate use 05/24/2013   Back pain 11/01/2012   Pain in joint, shoulder region 11/01/2012   Pain in joint, lower leg 11/01/2012   Vitamin D deficiency 04/21/2012   Encounter for therapeutic drug monitoring 04/21/2012   Paranoid schizophrenia ([place]) 04/21/2012   Priapism 04/21/2012   Smoking 04/21/2012   Intermittent pain 01/20/2012   Sickle cell disease, type SS ([place]) 01/20/2012    Juel Burrow, PT, DPT 12/30/2020, 12:24 PM  [facility] [address]  [place], [place], [address] Phone: [phone]   Fax:  [phone]  Name: Paige Vanderwoude MRN: [id number] Date of Birth: 1986-05-11

## 2020-12-30 NOTE — Patient Instructions (Addendum)
Access Code: [email or web address] URL: [email or web address] Date: 12/30/2020 Prepared by: Shelby Dubin Tanazia Achee  Exercises Standing Shoulder Flexion with Resistance - 2 x daily - 7 x weekly - 2 sets - 10 reps Standing Single Arm Shoulder Abduction with Resistance - 2 x daily - 7 x weekly - 2 sets - 10 reps Shoulder External Rotation and Scapular Retraction with Resistance - 2 x daily - 7 x weekly - 2 sets - 10 reps Standing Shoulder Horizontal Abduction with Resistance - 2 x daily - 7 x weekly - 2 sets - 10 reps Seated Scapular Retraction - 2 x daily - 7 x weekly - 2 sets - 10 reps

## 2021-01-08 ENCOUNTER — Telehealth: Payer: Self-pay

## 2021-01-08 NOTE — Telephone Encounter (Signed)
Oxycodone 20 mg °

## 2021-01-09 ENCOUNTER — Other Ambulatory Visit: Payer: Self-pay | Admitting: Nurse Practitioner

## 2021-01-09 MED ORDER — OXYCODONE HCL 20 MG PO TABS: 1.0000 | ORAL_TABLET | Freq: Four times a day (QID) | ORAL | 0 refills | Status: DC | PRN

## 2021-01-13 ENCOUNTER — Ambulatory Visit: Payer: Medicaid Other

## 2021-01-13 ENCOUNTER — Other Ambulatory Visit: Payer: Self-pay

## 2021-01-13 DIAGNOSIS — G8929 Other chronic pain: Secondary | ICD-10-CM

## 2021-01-13 DIAGNOSIS — M25611 Stiffness of right shoulder, not elsewhere classified: Secondary | ICD-10-CM

## 2021-01-13 DIAGNOSIS — M25511 Pain in right shoulder: Secondary | ICD-10-CM | POA: Diagnosis not present

## 2021-01-13 DIAGNOSIS — M6281 Muscle weakness (generalized): Secondary | ICD-10-CM

## 2021-01-13 DIAGNOSIS — R293 Abnormal posture: Secondary | ICD-10-CM

## 2021-01-13 NOTE — Therapy (Signed)
[facility] [address]  [place], [place], [address] Phone: [phone]   Fax:  [phone]  Physical Therapy Treatment  Patient Details  Name: Alejandro Soto MRN: [id number] Date of Birth: 30-Dec-1985 Referring Provider (PT): Dionisio David, NP   Encounter Date: 01/13/2021   PT End of Session - 01/13/21 1056     Visit Number 2    Date for PT Re-Evaluation 02/21/21    Authorization Type Medicaid    PT Start Time [id number]    PT Stop Time 1056    PT Time Calculation (min) 41 min    Activity Tolerance Patient tolerated treatment well    Behavior During Therapy [facility] for tasks assessed/performed             Past Medical History:  Diagnosis Date   Insomnia 08/2019   Pneumonia    Sickle cell anemia ([place])     Past Surgical History:  Procedure Laterality Date   NO PAST SURGERIES      There were no vitals filed for this visit.   Subjective Assessment - 01/13/21 1017     Subjective Pt reports having most pain with laying with his arm crossed under his head when sleeping and reaching into abduction.    Pertinent History Sickle Cell Anemia, Schizophrenia    Patient Stated Goals To decrease pain    Currently in Pain? No/denies                               [facility] Adult PT Treatment/Exercise - 01/13/21 0001       Exercises   Exercises Shoulder      Shoulder Exercises: Sidelying   External Rotation Strengthening;Right;20 reps;Weights    External Rotation Weight (lbs) 1      Shoulder Exercises: Standing   Horizontal ABduction Strengthening;Both;20 reps;Theraband    Theraband Level (Shoulder Horizontal ABduction) Level 2 (Red)    Horizontal ABduction Limitations pool noodle behind back    External Rotation Strengthening;Both;10 reps;Theraband    Theraband Level (Shoulder External Rotation) Level 2 (Red)    Extension Strengthening;Both;20 reps;Theraband    Theraband Level (Shoulder Extension)  Level 2 (Red)    Row Strengthening;Both;20 reps;Theraband    Theraband Level (Shoulder Row) Level 2 (Red)    Retraction Strengthening;Both;10 reps    Retraction Limitations pool noodle behind back      Shoulder Exercises: ROM/Strengthening   UBE (Upper Arm Bike) L1.0 3 min fwd/ 3 min back    Other ROM/Strengthening Exercises standing pball rolls into flexion 10x; CW/CCW circles with orange pball on wall 20x each way                      PT Short Term Goals - 01/13/21 1058       PT SHORT TERM GOAL #1   Title Pt will be independent with initial HEP.    Time 3    Period Weeks    Status On-going               PT Long Term Goals - 01/13/21 1058       PT LONG TERM GOAL #1   Title Patient will be independent with advanced HEP.    Time 8    Period Weeks    Status On-going      PT LONG TERM GOAL #2   Title Patient will have AROM that is [facility] without an  increase in pain to allow him to cut hair.    Time 8    Period Weeks    Status On-going      PT LONG TERM GOAL #3   Title Patient will increase R shoulder strength to at least 4+/5 to allow him to return to social activivies.    Time 8    Period Weeks    Status On-going      PT LONG TERM GOAL #4   Title Patient will report at least a 75% improvement in symptoms.    Time 8    Period Weeks    Status On-going                   Plan - 01/13/21 1059     Clinical Impression Statement Pt reported no concerns with HEP from last visit. Lots of cues required for full periscap muscle engagement with exercises. Cues also needed to isolate the targeted muscles with exercises and to prevent compensations. He noted that he did not sleep very well the past few nights and that he was tired during today's session. He demonstrated pain with OH abduction today and resisted ER but after advising him to avoid the painful ROM it subsided. Pt would continue to benefit from periscap and RTC strengthening to stabilize the [facility]  joint for OH movements.    Personal Factors and Comorbidities Transportation    PT Frequency 2x / week    PT Duration 8 weeks    PT Treatment/Interventions ADLs/Self Care Home Management;Cryotherapy;Electrical Stimulation;Iontophoresis '4mg'$ /ml Dexamethasone;Moist Heat;Ultrasound;Functional mobility training;Therapeutic activities;Therapeutic exercise;Neuromuscular re-education;Patient/family education;Passive range of motion;Dry needling;Taping;Vasopneumatic Device    PT Next Visit Plan progress as needed.  strengthening, postural re-education    PT Home Exercise Plan Access Code: [email or web address]    Consulted and Agree with Plan of Care Patient             Patient will benefit from skilled therapeutic intervention in order to improve the following deficits and impairments:  Decreased range of motion, Increased muscle spasms, Pain, Improper body mechanics, Decreased strength, Postural dysfunction  Visit Diagnosis: Chronic right shoulder pain  Stiffness of right shoulder, not elsewhere classified  Muscle weakness (generalized)  Abnormal posture     Problem List Patient Active Problem List   Diagnosis Date Noted   Insomnia 03/23/2019   Chronic prescription opiate use 03/23/2019   Methadone use 08/12/2015   Chronic back pain greater than 3 months duration 04/09/2015   Abdominal pain, epigastric 12/24/2014   Sickle cell pain crisis ([place]) 12/16/2014   Thrombocytosis ([place]) 07/14/2014   Anemia 07/14/2014   Sore throat 07/03/2014   Sebaceous cyst 07/03/2014   Atopic dermatitis 06/12/2014   Leukocytosis 05/02/2014   Tobacco dependence 04/25/2014   Neck pain, bilateral 03/01/2014   Hb-SS disease without crisis ([place]) 02/22/2014   DJD (degenerative joint disease), lumbosacral 09/18/2013   Neuropathic pain 09/18/2013   Back pain, acute 08/11/2013   Opiate use 05/24/2013   Back pain 11/01/2012   Pain in joint, shoulder region 11/01/2012   Pain in joint, lower leg 11/01/2012   Vitamin D  deficiency 04/21/2012   Encounter for therapeutic drug monitoring 04/21/2012   Paranoid schizophrenia ([place]) 04/21/2012   Priapism 04/21/2012   Smoking 04/21/2012   Intermittent pain 01/20/2012   Sickle cell disease, type SS ([place]) 01/20/2012    Artist Pais, PTA 01/13/2021, 11:59 AM  [facility] [address]  [place], [place], [address]  Phone: [phone]   Fax:  [phone]  Name: Alejandro Soto MRN: [id number] Date of Birth: 1986/04/30

## 2021-01-22 ENCOUNTER — Telehealth: Payer: Self-pay

## 2021-01-22 NOTE — Telephone Encounter (Signed)
Oxycodone  °

## 2021-01-24 ENCOUNTER — Other Ambulatory Visit: Payer: Self-pay | Admitting: Nurse Practitioner

## 2021-01-24 MED ORDER — OXYCODONE HCL 20 MG PO TABS: 1.0000 | ORAL_TABLET | Freq: Four times a day (QID) | ORAL | 0 refills | Status: DC | PRN

## 2021-01-27 ENCOUNTER — Ambulatory Visit: Payer: Medicaid Other | Attending: Nurse Practitioner | Admitting: Physical Therapy

## 2021-01-27 ENCOUNTER — Other Ambulatory Visit: Payer: Self-pay

## 2021-01-27 DIAGNOSIS — M25511 Pain in right shoulder: Secondary | ICD-10-CM | POA: Insufficient documentation

## 2021-01-27 DIAGNOSIS — M25611 Stiffness of right shoulder, not elsewhere classified: Secondary | ICD-10-CM | POA: Insufficient documentation

## 2021-01-27 DIAGNOSIS — G8929 Other chronic pain: Secondary | ICD-10-CM | POA: Insufficient documentation

## 2021-01-27 DIAGNOSIS — M6281 Muscle weakness (generalized): Secondary | ICD-10-CM | POA: Diagnosis not present

## 2021-01-27 DIAGNOSIS — R293 Abnormal posture: Secondary | ICD-10-CM | POA: Insufficient documentation

## 2021-01-27 NOTE — Therapy (Signed)
[facility] [address]  [place], [place], [address] Phone: [phone]   Fax:  [phone]  Physical Therapy Treatment  Patient Details  Name: Alejandro Soto MRN: [id number] Date of Birth: 10-Jul-1985 Referring Provider (PT): Dionisio David, NP   Encounter Date: 01/27/2021   PT End of Session - 01/27/21 1019     Visit Number 3    Date for PT Re-Evaluation 02/21/21    Authorization Type Medicaid    PT Start Time 1016    PT Stop Time 1059    PT Time Calculation (min) 43 min    Activity Tolerance Patient tolerated treatment well    Behavior During Therapy [facility] for tasks assessed/performed             Past Medical History:  Diagnosis Date   Insomnia 08/2019   Pneumonia    Sickle cell anemia ([place])     Past Surgical History:  Procedure Laterality Date   NO PAST SURGERIES      There were no vitals filed for this visit.   Subjective Assessment - 01/27/21 1018     Subjective Pt reports shoulder is doing a little better, only hurts when moves it wrong.    Pertinent History Sickle Cell Anemia, Schizophrenia    Patient Stated Goals To decrease pain    Currently in Pain? Yes    Pain Score 3     Pain Location Shoulder    Pain Orientation Right                               [facility] Adult PT Treatment/Exercise - 01/27/21 0001       Exercises   Exercises Shoulder      Shoulder Exercises: Standing   Row Strengthening;Both;20 reps;Theraband    Theraband Level (Shoulder Row) Level 2 (Red)    Retraction Strengthening;Both;20 reps    Theraband Level (Shoulder Retraction) Level 2 (Red)    Other Standing Exercises wall angels x 10      Shoulder Exercises: ROM/Strengthening   UBE (Upper Arm Bike) L1.0 3 min fwd/ 3 min back    Lat Pull 10 reps    Lat Pull Limitations 20#    Cybex Press 20 reps    Cybex Press Limitations 15#    Wall Pushups 20 reps    Other ROM/Strengthening Exercises  standing pball rolls CW/CCW circles with orange pball on wall 20x each way      Modalities   Modalities --   declined                     PT Short Term Goals - 01/27/21 1055       PT SHORT TERM GOAL #1   Title Pt will be independent with initial HEP.    Time 3    Period Weeks    Status Achieved   reports good compliance              PT Long Term Goals - 01/27/21 1055       PT LONG TERM GOAL #1   Title Patient will be independent with advanced HEP.    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    Baseline 01/27/21- progress, able to reach behind head without pain, but horizontal adduction still increases pain.    Time 8    Period Weeks    Status On-going      PT LONG TERM GOAL #3   Title Patient will increase R shoulder strength to at least 4+/5 to allow him to return to social activivies.    Baseline 01/27/21- progress, 4/5 R shoulder abduction and flexion, all other 4+/5    Time 8    Period Weeks    Status On-going      PT LONG TERM GOAL #4   Title Patient will report at least a 75% improvement in symptoms.    Baseline 8/8/222- progress reports 30% improvement.    Time 8    Period Weeks    Status On-going   reports 30% improvement                  Plan - 01/27/21 1020     Clinical Impression Statement Pt reports mostly pain when he moves wrong his R shoulder wrong.  Overall he reports 30% improvement in pain.  Frequently he reported pain with initial exercise but improved with repetition.  He was very interested in weight machines today, so trialed very light weight reps which he toleated well.  Educated throughout to not push too far into pain as do not want to aggravate R shoulder musculature.  Today he demonstrated 4+/5 R shoulder strength except for abduction and flexion, and full  functional ROM for IR and ER compared to L shoulder.  He would benefit from continued skilled therapy to decrease R shoulder pain to perform all desired activities.    Personal Factors and Comorbidities Transportation    PT Frequency 2x / week    PT Duration 8 weeks    PT Treatment/Interventions ADLs/Self Care Home Management;Cryotherapy;Electrical Stimulation;Iontophoresis '4mg'$ /ml Dexamethasone;Moist Heat;Ultrasound;Functional mobility training;Therapeutic activities;Therapeutic exercise;Neuromuscular re-education;Patient/family education;Passive range of motion;Dry needling;Taping;Vasopneumatic Device    PT Next Visit Plan progress as needed.  strengthening, postural re-education    PT Home Exercise Plan Access Code: [email or web address]    Consulted and Agree with Plan of Care Patient             Patient will benefit from skilled therapeutic intervention in order to improve the following deficits and impairments:  Decreased range of motion, Increased muscle spasms, Pain, Improper body mechanics, Decreased strength, Postural dysfunction  Visit Diagnosis: Chronic right shoulder pain  Stiffness of right shoulder, not elsewhere classified  Muscle weakness (generalized)  Abnormal posture     Problem List Patient Active Problem List   Diagnosis Date Noted   Insomnia 03/23/2019   Chronic prescription opiate use 03/23/2019   Methadone use 08/12/2015   Chronic back pain greater than 3 months duration 04/09/2015   Abdominal pain, epigastric 12/24/2014   Sickle cell pain crisis ([place]) 12/16/2014   Thrombocytosis ([place]) 07/14/2014   Anemia 07/14/2014   Sore throat 07/03/2014   Sebaceous cyst 07/03/2014   Atopic dermatitis 06/12/2014   Leukocytosis 05/02/2014   Tobacco dependence 04/25/2014   Neck pain, bilateral 03/01/2014   Hb-SS disease without crisis ([place]) 02/22/2014   DJD (degenerative joint disease), lumbosacral 09/18/2013   Neuropathic pain 09/18/2013   Back pain, acute 08/11/2013    Opiate use 05/24/2013   Back pain 11/01/2012   Pain in joint, shoulder region 11/01/2012   Pain in joint, lower leg 11/01/2012   Vitamin D deficiency 04/21/2012   Encounter for therapeutic drug  monitoring 04/21/2012   Paranoid schizophrenia ([place]) 04/21/2012   Priapism 04/21/2012   Smoking 04/21/2012   Intermittent pain 01/20/2012   Sickle cell disease, type SS ([place]) 01/20/2012    Rennie Natter PT, DPT 01/27/2021, 12:13 PM  [facility] [address]  [place], [place], [address] Phone: [phone]   Fax:  [phone]  Name: Alejandro Soto MRN: [id number] Date of Birth: 11/15/1985

## 2021-02-07 ENCOUNTER — Telehealth: Payer: Self-pay

## 2021-02-07 ENCOUNTER — Other Ambulatory Visit: Payer: Self-pay | Admitting: Nurse Practitioner

## 2021-02-07 MED ORDER — OXYCODONE HCL 20 MG PO TABS: 1.0000 | ORAL_TABLET | Freq: Four times a day (QID) | ORAL | 0 refills | Status: DC | PRN

## 2021-02-07 NOTE — Telephone Encounter (Signed)
Oxycodone 20 mg °

## 2021-02-12 ENCOUNTER — Ambulatory Visit: Payer: Medicaid Other

## 2021-02-17 ENCOUNTER — Encounter: Payer: Self-pay | Admitting: Physical Therapy

## 2021-02-17 ENCOUNTER — Ambulatory Visit: Payer: Medicaid Other | Admitting: Physical Therapy

## 2021-02-17 ENCOUNTER — Other Ambulatory Visit: Payer: Self-pay

## 2021-02-17 DIAGNOSIS — M25611 Stiffness of right shoulder, not elsewhere classified: Secondary | ICD-10-CM | POA: Diagnosis not present

## 2021-02-17 DIAGNOSIS — M6281 Muscle weakness (generalized): Secondary | ICD-10-CM | POA: Diagnosis not present

## 2021-02-17 DIAGNOSIS — M25511 Pain in right shoulder: Secondary | ICD-10-CM | POA: Diagnosis not present

## 2021-02-17 DIAGNOSIS — G8929 Other chronic pain: Secondary | ICD-10-CM | POA: Diagnosis not present

## 2021-02-17 DIAGNOSIS — R293 Abnormal posture: Secondary | ICD-10-CM

## 2021-02-17 DIAGNOSIS — Z7689 Persons encountering health services in other specified circumstances: Secondary | ICD-10-CM | POA: Diagnosis not present

## 2021-02-17 NOTE — Patient Instructions (Signed)
Access Code: [email or web address] URL: [email or web address] Date: 02/17/2021 Prepared by: Glenetta Hew  Exercises Standing I's, Y's, T's - 1 x daily - 7 x weekly - 2 sets - 10 reps (modified to 90 deg)  Standard Plank - 1 x daily - 7 x weekly - 1 sets - 3 reps - 30 sec hold Standing Wall [employer] with Mini Swiss Ball - 1 x daily - 7 x weekly - 3 sets - 10 reps

## 2021-02-17 NOTE — Therapy (Signed)
[facility] [address]  [place], [place], [address] Phone: [phone]   Fax:  [phone]  Physical Therapy Treatment  Patient Details  Name: Alejandro Soto MRN: [id number] Date of Birth: 1986-06-06 Referring Provider (PT): Dionisio David, NP   Encounter Date: 02/17/2021   PT End of Session - 02/17/21 1009     Visit Number 4    Number of Visits 15    Date for PT Re-Evaluation 02/21/21    Authorization Type Medicaid    Authorization Time Period 02/03/21-03/16/21    Authorization - Visit Number 1    Authorization - Number of Visits 12    PT Start Time 1010    PT Stop Time 1057    PT Time Calculation (min) 47 min    Activity Tolerance Patient tolerated treatment well    Behavior During Therapy [facility] for tasks assessed/performed             Past Medical History:  Diagnosis Date   Insomnia 08/2019   Pneumonia    Sickle cell anemia ([place])     Past Surgical History:  Procedure Laterality Date   NO PAST SURGERIES      There were no vitals filed for this visit.   Subjective Assessment - 02/17/21 1013     Subjective Pt. reports that shoulder continues to improve, still trouble with certain movements.    Pertinent History Sickle Cell Anemia, Schizophrenia    Patient Stated Goals To decrease pain    Currently in Pain? No/denies    Pain Location Shoulder    Pain Orientation Right                               [facility] Adult PT Treatment/Exercise - 02/17/21 0001       Exercises   Exercises Shoulder      Shoulder Exercises: Seated   Other Seated Exercises I, Y, T;s to 90 deg with 2# weights 2 x 10      Shoulder Exercises: ROM/Strengthening   UBE (Upper Arm Bike) L1.0 3 min fwd/ 3 min back    Lat Pull 20 reps    Lat Pull Limitations 20# 2 x 10, 25# x 10    Cybex Press 20 reps    Cybex Press Limitations 15# 1 x 10, 20# 1 x 10   little discomfort, working muscle   Cybex Row 20 reps     Cybex Row Limitations 15# 1 x 10, 25# 2 x 10    Pushups 10 reps    Pushups Limitations at wall    Plank 30 seconds;3 reps    Plank Limitations elbows and toes    Other ROM/Strengthening Exercises standing pball rolls CW/CCW circles with orange pball on wall 3 x 10 CW/ CCW each way                    PT Education - 02/17/21 1058     Education Details HEP advanced    Person(s) Educated Patient    Methods Explanation;Demonstration;Handout    Comprehension Verbalized understanding;Returned demonstration              PT Short Term Goals - 01/27/21 1055       PT SHORT TERM GOAL #1   Title Pt will be independent with initial HEP.    Time 3    Period Weeks    Status Achieved   reports  good compliance              PT Long Term Goals - 01/27/21 1055       PT LONG TERM GOAL #1   Title Patient will be independent with advanced HEP.    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    Baseline 01/27/21- progress, able to reach behind head without pain, but horizontal adduction still increases pain.    Time 8    Period Weeks    Status On-going      PT LONG TERM GOAL #3   Title Patient will increase R shoulder strength to at least 4+/5 to allow him to return to social activivies.    Baseline 01/27/21- progress, 4/5 R shoulder abduction and flexion, all other 4+/5    Time 8    Period Weeks    Status On-going      PT LONG TERM GOAL #4   Title Patient will report at least a 75% improvement in symptoms.    Baseline 8/8/222- progress reports 30% improvement.    Time 8    Period Weeks    Status On-going   reports 30% improvement                  Plan - 02/17/21 1010     Clinical Impression Statement Patient is making good progress, reporting less shoulder pain, and was able to advance exercise today,  performing machine based exercises with low weights without pain, some discomfort with shoulder press but again reported more effort than pain, also started body weight supported exercises with planks.  Encouraged to consider returning to gym as had stopped due to [place].  Updated and progressed HEP.  Pt. would benefit from continued skilled therapy.    Personal Factors and Comorbidities Transportation    PT Frequency 2x / week    PT Duration 8 weeks    PT Treatment/Interventions ADLs/Self Care Home Management;Cryotherapy;Electrical Stimulation;Iontophoresis '4mg'$ /ml Dexamethasone;Moist Heat;Ultrasound;Functional mobility training;Therapeutic activities;Therapeutic exercise;Neuromuscular re-education;Patient/family education;Passive range of motion;Dry needling;Taping;Vasopneumatic Device    PT Next Visit Plan progress as needed.  strengthening, postural re-education    PT Home Exercise Plan Access Code: [email or web address];Access Code: [email or web address]    Consulted and Agree with Plan of Care Patient             Patient will benefit from skilled therapeutic intervention in order to improve the following deficits and impairments:  Decreased range of motion, Increased muscle spasms, Pain, Improper body mechanics, Decreased strength, Postural dysfunction  Visit Diagnosis: Chronic right shoulder pain  Stiffness of right shoulder, not elsewhere classified  Muscle weakness (generalized)  Abnormal posture     Problem List Patient Active Problem List   Diagnosis Date Noted   Insomnia 03/23/2019   Chronic prescription opiate use 03/23/2019   Methadone use 08/12/2015   Chronic back pain greater than 3 months duration 04/09/2015   Abdominal pain, epigastric 12/24/2014   Sickle cell pain crisis ([place]) 12/16/2014   Thrombocytosis ([place]) 07/14/2014   Anemia 07/14/2014   Sore throat 07/03/2014   Sebaceous cyst 07/03/2014   Atopic dermatitis 06/12/2014   Leukocytosis 05/02/2014   Tobacco dependence 04/25/2014    Neck pain, bilateral 03/01/2014   Hb-SS disease without crisis ([place]) 02/22/2014   DJD (degenerative joint  disease), lumbosacral 09/18/2013   Neuropathic pain 09/18/2013   Back pain, acute 08/11/2013   Opiate use 05/24/2013   Back pain 11/01/2012   Pain in joint, shoulder region 11/01/2012   Pain in joint, lower leg 11/01/2012   Vitamin D deficiency 04/21/2012   Encounter for therapeutic drug monitoring 04/21/2012   Paranoid schizophrenia ([place]) 04/21/2012   Priapism 04/21/2012   Smoking 04/21/2012   Intermittent pain 01/20/2012   Sickle cell disease, type SS ([place]) 01/20/2012    Rennie Natter PT, DPT 02/17/2021, 11:01 AM  [facility] [address]  [place], [place], [address] Phone: [phone]   Fax:  [phone]  Name: Alejandro Soto MRN: [id number] Date of Birth: October 02, 1985

## 2021-02-19 ENCOUNTER — Ambulatory Visit: Payer: Medicaid Other

## 2021-02-19 ENCOUNTER — Other Ambulatory Visit: Payer: Self-pay

## 2021-02-19 DIAGNOSIS — R293 Abnormal posture: Secondary | ICD-10-CM

## 2021-02-19 DIAGNOSIS — M6281 Muscle weakness (generalized): Secondary | ICD-10-CM | POA: Diagnosis not present

## 2021-02-19 DIAGNOSIS — G8929 Other chronic pain: Secondary | ICD-10-CM

## 2021-02-19 DIAGNOSIS — M25511 Pain in right shoulder: Secondary | ICD-10-CM

## 2021-02-19 DIAGNOSIS — M25611 Stiffness of right shoulder, not elsewhere classified: Secondary | ICD-10-CM

## 2021-02-19 NOTE — Therapy (Signed)
[facility] [address]  [place], [place], [address] Phone: [phone]   Fax:  [phone]  Physical Therapy Treatment  Patient Details  Name: Alejandro Soto MRN: [id number] Date of Birth: 1986/05/08 Referring Provider (PT): Dionisio David, NP   Encounter Date: 02/19/2021   PT End of Session - 02/19/21 1116     Visit Number 5    Number of Visits 15    Date for PT Re-Evaluation 02/21/21    Authorization Type Medicaid    Authorization Time Period 02/03/21-03/16/21    Authorization - Visit Number 2    Authorization - Number of Visits 12    PT Start Time 1019    PT Stop Time 1103    PT Time Calculation (min) 44 min    Activity Tolerance Patient tolerated treatment well    Behavior During Therapy [facility] for tasks assessed/performed             Past Medical History:  Diagnosis Date   Insomnia 08/2019   Pneumonia    Sickle cell anemia ([place])     Past Surgical History:  Procedure Laterality Date   NO PAST SURGERIES      There were no vitals filed for this visit.   Subjective Assessment - 02/19/21 1025     Subjective Pt notes slight improvement in R shoulder, still discomfort moving away from his body.    Pertinent History Sickle Cell Anemia, Schizophrenia    Patient Stated Goals To decrease pain    Currently in Pain? Yes    Pain Score 4     Pain Location Shoulder    Pain Orientation Right    Pain Descriptors / Indicators Aching;Throbbing    Pain Type Chronic pain                               [facility] Adult PT Treatment/Exercise - 02/19/21 0001       Exercises   Exercises Shoulder      Shoulder Exercises: Sidelying   External Rotation Strengthening;Right;20 reps;Weights    External Rotation Weight (lbs) 2      Shoulder Exercises: Standing   Horizontal ABduction Strengthening;Both;10 reps;Theraband    Theraband Level (Shoulder Horizontal ABduction) Level 3 (Green)    Flexion  Strengthening;Right;10 reps;Theraband    Theraband Level (Shoulder Flexion) Level 3 (Green)    ABduction Strengthening;Right;10 reps;Theraband    Theraband Level (Shoulder ABduction) Level 3 (Green)      Shoulder Exercises: ROM/Strengthening   UBE (Upper Arm Bike) L2.0 3 min fwd/ 3 min back    Lat Pull Limitations 25# 2x10    Cybex Row Limitations 25# 2x10    Wall Wash R shoulder into flexion and scaption 10 reps    Other ROM/Strengthening Exercises CW/CCW circles with R shoulder 2x10 each way    Other ROM/Strengthening Exercises B I,T, Ys 10 reps with 2# weight                      PT Short Term Goals - 01/27/21 1055       PT SHORT TERM GOAL #1   Title Pt will be independent with initial HEP.    Time 3    Period Weeks    Status Achieved   reports good compliance              PT Long Term Goals - 01/27/21 1055  PT LONG TERM GOAL #1   Title Patient will be independent with advanced HEP.    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    Baseline 01/27/21- progress, able to reach behind head without pain, but horizontal adduction still increases pain.    Time 8    Period Weeks    Status On-going      PT LONG TERM GOAL #3   Title Patient will increase R shoulder strength to at least 4+/5 to allow him to return to social activivies.    Baseline 01/27/21- progress, 4/5 R shoulder abduction and flexion, all other 4+/5    Time 8    Period Weeks    Status On-going      PT LONG TERM GOAL #4   Title Patient will report at least a 75% improvement in symptoms.    Baseline 8/8/222- progress reports 30% improvement.    Time 8    Period Weeks    Status On-going   reports 30% improvement                  Plan - 02/19/21 1116     Clinical Impression Statement Pt demonstrated a good tolerance  for ther ex today. Progressed OH strengthening to green TB, advised the use of a water bottle or soup can for I,T,Y exercise at home. Increased resistance with scap stab and RTC strengthening today, no reports of pain from patient but muscular effort noted. Mild pain with wall washes into scaption but no other reports of pain. Pt responded well.    Personal Factors and Comorbidities Transportation    PT Frequency 2x / week    PT Duration 8 weeks    PT Treatment/Interventions ADLs/Self Care Home Management;Cryotherapy;Electrical Stimulation;Iontophoresis '4mg'$ /ml Dexamethasone;Moist Heat;Ultrasound;Functional mobility training;Therapeutic activities;Therapeutic exercise;Neuromuscular re-education;Patient/family education;Passive range of motion;Dry needling;Taping;Vasopneumatic Device    PT Next Visit Plan progress as needed.  strengthening, postural re-education    PT Home Exercise Plan Access Code: [email or web address];Access Code: [email or web address]    Consulted and Agree with Plan of Care Patient             Patient will benefit from skilled therapeutic intervention in order to improve the following deficits and impairments:  Decreased range of motion, Increased muscle spasms, Pain, Improper body mechanics, Decreased strength, Postural dysfunction  Visit Diagnosis: Chronic right shoulder pain  Stiffness of right shoulder, not elsewhere classified  Muscle weakness (generalized)  Abnormal posture     Problem List Patient Active Problem List   Diagnosis Date Noted   Insomnia 03/23/2019   Chronic prescription opiate use 03/23/2019   Methadone use 08/12/2015   Chronic back pain greater than 3 months duration 04/09/2015   Abdominal pain, epigastric 12/24/2014   Sickle cell pain crisis ([place]) 12/16/2014   Thrombocytosis ([place]) 07/14/2014   Anemia 07/14/2014   Sore throat 07/03/2014   Sebaceous cyst 07/03/2014   Atopic dermatitis 06/12/2014   Leukocytosis 05/02/2014   Tobacco dependence 04/25/2014    Neck pain, bilateral 03/01/2014   Hb-SS disease without crisis ([place]) 02/22/2014   DJD (degenerative joint disease), lumbosacral 09/18/2013   Neuropathic pain 09/18/2013   Back pain, acute 08/11/2013   Opiate use 05/24/2013   Back pain 11/01/2012   Pain in joint, shoulder region 11/01/2012  Pain in joint, lower leg 11/01/2012   Vitamin D deficiency 04/21/2012   Encounter for therapeutic drug monitoring 04/21/2012   Paranoid schizophrenia ([place]) 04/21/2012   Priapism 04/21/2012   Smoking 04/21/2012   Intermittent pain 01/20/2012   Sickle cell disease, type SS ([place]) 01/20/2012    Artist Pais, PTA 02/19/2021, 11:22 AM  [facility] [address]  [place], [place], [address] Phone: [phone]   Fax:  [phone]  Name: Alejandro Soto MRN: [id number] Date of Birth: 1985-11-26

## 2021-02-21 ENCOUNTER — Telehealth: Payer: Self-pay

## 2021-02-21 NOTE — Telephone Encounter (Signed)
Oxycodone  °

## 2021-02-25 ENCOUNTER — Telehealth: Payer: Self-pay

## 2021-02-25 ENCOUNTER — Other Ambulatory Visit: Payer: Self-pay | Admitting: Internal Medicine

## 2021-02-25 ENCOUNTER — Ambulatory Visit: Payer: Medicaid Other | Attending: Nurse Practitioner | Admitting: Physical Therapy

## 2021-02-25 MED ORDER — OXYCODONE HCL 20 MG PO TABS: 1.0000 | ORAL_TABLET | Freq: Four times a day (QID) | ORAL | 0 refills | Status: DC | PRN

## 2021-02-25 NOTE — Telephone Encounter (Signed)
Oxycodone  °

## 2021-02-27 ENCOUNTER — Ambulatory Visit: Payer: Medicaid Other | Admitting: Physical Therapy

## 2021-03-03 ENCOUNTER — Ambulatory Visit: Payer: Medicaid Other | Admitting: Nurse Practitioner

## 2021-03-03 ENCOUNTER — Telehealth: Payer: Self-pay

## 2021-03-03 NOTE — Telephone Encounter (Signed)
Tramcinolone

## 2021-03-05 ENCOUNTER — Other Ambulatory Visit: Payer: Self-pay | Admitting: Nurse Practitioner

## 2021-03-05 DIAGNOSIS — L209 Atopic dermatitis, unspecified: Secondary | ICD-10-CM

## 2021-03-05 MED ORDER — TRIAMCINOLONE ACETONIDE 0.025 % EX OINT: 1.0000 "application " | TOPICAL_OINTMENT | Freq: Two times a day (BID) | CUTANEOUS | 2 refills | Status: DC

## 2021-03-06 NOTE — Telephone Encounter (Signed)
error 

## 2021-03-10 ENCOUNTER — Ambulatory Visit: Payer: Medicaid Other | Admitting: Physical Therapy

## 2021-03-10 ENCOUNTER — Telehealth: Payer: Self-pay

## 2021-03-10 NOTE — Telephone Encounter (Signed)
Oxycodone 20 mg °

## 2021-03-12 ENCOUNTER — Telehealth: Payer: Self-pay

## 2021-03-12 ENCOUNTER — Other Ambulatory Visit: Payer: Self-pay | Admitting: Nurse Practitioner

## 2021-03-12 MED ORDER — OXYCODONE HCL 20 MG PO TABS: 1.0000 | ORAL_TABLET | Freq: Four times a day (QID) | ORAL | 0 refills | Status: DC | PRN

## 2021-03-12 NOTE — Telephone Encounter (Signed)
Trazodone  °

## 2021-03-14 ENCOUNTER — Telehealth: Payer: Self-pay

## 2021-03-14 ENCOUNTER — Other Ambulatory Visit: Payer: Self-pay | Admitting: Nurse Practitioner

## 2021-03-14 DIAGNOSIS — D5709 Hb-ss disease with crisis with other specified complication: Secondary | ICD-10-CM

## 2021-03-14 MED ORDER — HYDROXYUREA 500 MG PO CAPS: 1500.0000 mg | ORAL_CAPSULE | Freq: Every day | ORAL | 3 refills | Status: DC

## 2021-03-14 NOTE — Telephone Encounter (Addendum)
Error

## 2021-03-14 NOTE — Telephone Encounter (Incomplete Revision)
Error

## 2021-03-17 NOTE — Telephone Encounter (Signed)
Pt is aware he has refills and has already picked them up at the pharmacy.

## 2021-03-25 ENCOUNTER — Telehealth: Payer: Self-pay

## 2021-03-25 NOTE — Telephone Encounter (Signed)
Oxycodone 20 mg °

## 2021-03-27 ENCOUNTER — Other Ambulatory Visit: Payer: Self-pay

## 2021-03-27 ENCOUNTER — Telehealth: Payer: Self-pay

## 2021-03-27 MED ORDER — OXYCODONE HCL 20 MG PO TABS: 1.0000 | ORAL_TABLET | Freq: Four times a day (QID) | ORAL | 0 refills | Status: DC | PRN

## 2021-03-27 NOTE — Telephone Encounter (Signed)
Oxycodone  °

## 2021-03-28 ENCOUNTER — Telehealth: Payer: Self-pay

## 2021-03-28 NOTE — Telephone Encounter (Signed)
Prior Approval #:[id number] [place] Type:PHARMACYRecipient:Alejandro COWANRecipient [id number] NBilling Provider:Billing Provider QA:[id number] Provider Name:[facility] Provider TM:[id number] Submission Date:03/28/2021  Status:APPROVEDEffective Begin Date:10/07/2022Effective End Date:09/24/2021

## 2021-04-07 ENCOUNTER — Other Ambulatory Visit: Payer: Self-pay | Admitting: Nurse Practitioner

## 2021-04-07 DIAGNOSIS — G894 Chronic pain syndrome: Secondary | ICD-10-CM

## 2021-04-07 DIAGNOSIS — D571 Sickle-cell disease without crisis: Secondary | ICD-10-CM

## 2021-04-07 MED ORDER — OXYCODONE HCL 20 MG PO TABS: 1.0000 | ORAL_TABLET | Freq: Four times a day (QID) | ORAL | 0 refills | Status: DC | PRN

## 2021-04-07 NOTE — Telephone Encounter (Signed)
The refill has been sent. Thanks

## 2021-04-10 ENCOUNTER — Encounter: Payer: Self-pay | Admitting: Nurse Practitioner

## 2021-04-10 ENCOUNTER — Ambulatory Visit (INDEPENDENT_AMBULATORY_CARE_PROVIDER_SITE_OTHER): Payer: Medicaid Other | Admitting: Nurse Practitioner

## 2021-04-10 ENCOUNTER — Other Ambulatory Visit: Payer: Self-pay

## 2021-04-10 VITALS — BP 116/71 | HR 81 | Temp 98.2°F | Ht 68.0 in | Wt 185.1 lb

## 2021-04-10 DIAGNOSIS — G894 Chronic pain syndrome: Secondary | ICD-10-CM | POA: Diagnosis not present

## 2021-04-10 DIAGNOSIS — D571 Sickle-cell disease without crisis: Secondary | ICD-10-CM | POA: Diagnosis not present

## 2021-04-10 DIAGNOSIS — Z79891 Long term (current) use of opiate analgesic: Secondary | ICD-10-CM | POA: Diagnosis not present

## 2021-04-10 DIAGNOSIS — K5903 Drug induced constipation: Secondary | ICD-10-CM

## 2021-04-10 DIAGNOSIS — T402X5A Adverse effect of other opioids, initial encounter: Secondary | ICD-10-CM

## 2021-04-10 DIAGNOSIS — Z13220 Encounter for screening for lipoid disorders: Secondary | ICD-10-CM

## 2021-04-10 LAB — POCT URINALYSIS DIP (CLINITEK)
Blood, UA: NEGATIVE
Glucose, UA: NEGATIVE mg/dL
Leukocytes, UA: NEGATIVE
Nitrite, UA: NEGATIVE
POC PROTEIN,UA: 30 — AB
Spec Grav, UA: 1.01 (ref 1.010–1.025)
Urobilinogen, UA: >=8 E.U./dL — AB
pH, UA: 5.5 (ref 5.0–8.0)

## 2021-04-10 MED ORDER — NALOXEGOL OXALATE 25 MG PO TABS: 25.0000 mg | ORAL_TABLET | Freq: Every day | ORAL | 0 refills | Status: AC

## 2021-04-10 NOTE — Patient Instructions (Addendum)
Sickle Cell Anemia, Adult Sickle cell anemia is a condition where your red blood cells are shaped like sickles. Red blood cells carry oxygen through the body. Sickle-shaped cells do not live as long as normal red blood cells. They also clump together and block blood from flowing through the blood vessels. This prevents the body from getting enough oxygen. Sickle cell anemia causes organ damage and pain. It also increases the risk of infection. Follow these instructions at home: Medicines Take over-the-counter and prescription medicines only as told by your doctor. If you were prescribed an antibiotic medicine, take it as told by your doctor. Do not stop taking the antibiotic even if you start to feel better. If you develop a fever, do not take medicines to lower the fever right away. Tell your doctor about the fever. Managing pain, stiffness, and swelling Try these methods to help with pain: Use a heating pad. Take a warm bath. Distract yourself, such as by watching TV. Eating and drinking Drink enough fluid to keep your pee (urine) clear or pale yellow. Drink more in hot weather and during exercise. Limit or avoid alcohol. Eat a healthy diet. Eat plenty of fruits, vegetables, whole grains, and lean protein. Take vitamins and supplements as told by your doctor. Traveling When traveling, keep these with you: Your medical information. The names of your doctors. Your medicines. If you need to take an airplane, talk to your doctor first. Activity Rest often. Avoid exercises that make your heart beat much faster, such as jogging. General instructions Do not use products that have nicotine or tobacco, such as cigarettes and e-cigarettes. If you need help quitting, ask your doctor. Consider wearing a medical alert bracelet. Avoid being in high places (high altitudes), such as mountains. Avoid very hot or cold temperatures. Avoid places where the temperature changes a lot. Keep all follow-up  visits as told by your doctor. This is important. Contact a doctor if: A joint hurts. Your feet or hands hurt or swell. You feel tired (fatigued). Get help right away if: You have symptoms of infection. These include: Fever. Chills. Being very tired. Irritability. Poor eating. Throwing up (vomiting). You feel dizzy or faint. You have new stomach pain, especially on the left side. You have a an erection (priapism) that lasts more than 4 hours. You have numbness in your arms or legs. You have a hard time moving your arms or legs. You have trouble talking. You have pain that does not go away when you take medicine. You are short of breath. You are breathing fast. You have a long-term cough. You have pain in your chest. You have a bad headache. You have a stiff neck. Your stomach looks bloated even though you did not eat much. Your skin is pale. You suddenly cannot see well. Summary Sickle cell anemia is a condition where your red blood cells are shaped like sickles. Follow your doctor's advice on ways to manage pain, food to eat, activities to do, and steps to take for safe travel. Get medical help right away if you have any signs of infection, such as a fever. This information is not intended to replace advice given to you by your health care provider. Make sure you discuss any questions you have with your health care provider. Document Revised: 11/02/2019 Document Reviewed: 11/02/2019 Elsevier Patient Education  2022 [employer].  Preventing High Cholesterol Cholesterol is a white, waxy substance similar to fat that the human body needs to help build cells. The liver  makes all the cholesterol that a person's body needs. Having high cholesterol (hypercholesterolemia) increases your risk for heart disease and stroke. Extra or excess cholesterol comes from the food that you eat. High cholesterol can often be prevented with diet and lifestyle changes. If you already have high  cholesterol, you can control it with diet, lifestyle changes, and medicines. How can high cholesterol affect me? If you have high cholesterol, fatty deposits (plaques) may build up on the walls of your blood vessels. The blood vessels that carry blood away from your heart are called arteries. Plaques make the arteries narrower and stiffer. This in turn can: Restrict or block blood flow and cause blood clots to form. Increase your risk for heart attack and stroke. What can increase my risk for high cholesterol? This condition is more likely to develop in people who: Eat foods that are high in saturated fat or cholesterol. Saturated fat is mostly found in foods that come from animal sources. Are overweight. Are not getting enough exercise. Use products that contain nicotine or tobacco, such as cigarettes, e-cigarettes, and chewing tobacco. Have a family history of high cholesterol (familial hypercholesterolemia). What actions can I take to prevent this? Nutrition  Eat less saturated fat. Avoid trans fats (partially hydrogenated oils). These are often found in margarine and in some baked goods, fried foods, and snacks bought in packages. Avoid precooked or cured meat, such as bacon, sausages, or meat loaves. Avoid foods and drinks that have added sugars. Eat more fruits, vegetables, and whole grains. Choose healthy sources of protein, such as fish, poultry, lean cuts of red meat, beans, peas, lentils, and nuts. Choose healthy sources of fat, such as: Nuts. Vegetable oils, especially olive oil. Fish that have healthy fats, such as omega-3 fatty acids. These fish include mackerel or salmon. Lifestyle Lose weight if you are overweight. Maintaining a healthy body mass index (BMI) can help prevent or control high cholesterol. It can also lower your risk for diabetes and high blood pressure. Ask your health care provider to help you with a diet and exercise plan to lose weight safely. Do not use  any products that contain nicotine or tobacco. These products include cigarettes, chewing tobacco, and vaping devices, such as e-cigarettes. If you need help quitting, ask your health care provider. Alcohol use Do not drink alcohol if: Your health care provider tells you not to drink. You are pregnant, may be pregnant, or are planning to become pregnant. If you drink alcohol: Limit how much you have to: 0-1 drink a day for women. 0-2 drinks a day for men. Know how much alcohol is in your drink. In the [place] one drink equals one 12 oz bottle of beer (355 mL), one 5 oz glass of wine (148 mL), or one 1 oz glass of hard liquor (44 mL). Activity  Get enough exercise. Do exercises as told by your health care provider. Each week, do at least 150 minutes of exercise that takes a medium level of effort (moderate-intensity exercise). This kind of exercise: Makes your heart beat faster while allowing you to still be able to talk. Can be done in short sessions several times a day or longer sessions a few times a week. For example, on 5 days each week, you could walk fast or ride your bike 3 times a day for 10 minutes each time. Medicines Your health care provider may recommend medicines to help lower cholesterol. This may be a medicine to lower the amount of cholesterol that  your liver makes. You may need medicine if: Diet and lifestyle changes have not lowered your cholesterol enough. You have high cholesterol and other risk factors for heart disease or stroke. Take over-the-counter and prescription medicines only as told by your health care provider. General information Manage your risk factors for high cholesterol. Talk with your health care provider about all your risk factors and how to lower your risk. Manage other conditions that you have, such as diabetes or high blood pressure (hypertension). Have blood tests to check your cholesterol levels at regular points in time as told by your health care  provider. Keep all follow-up visits. This is important. Where to find more information American Heart Association: www.heart.org National Heart, Lung, and Blood Institute: https://wilson-eaton.com/ Summary High cholesterol increases your risk for heart disease and stroke. By keeping your cholesterol level low, you can reduce your risk for these conditions. High cholesterol can often be prevented with diet and lifestyle changes. Work with your health care provider to manage your risk factors, and have your blood tested regularly. This information is not intended to replace advice given to you by your health care provider. Make sure you discuss any questions you have with your health care provider. Document Revised: 08/12/2020 Document Reviewed: 08/12/2020 Elsevier Patient Education  2022 [employer].

## 2021-04-10 NOTE — Progress Notes (Signed)
[facility] [address], [place]  [id number] Phone:  [phone]   Fax:  [phone]   Established Patient Office Visit  Subjective:  Patient ID: Alejandro Soto, male    DOB: [birth year]  Age: 35 y.o. MRN: [id number]  CC:  Chief Complaint  Patient presents with   Follow-up    3 month followup    HPI Alejandro Soto presents for follow up. He  has a past medical history of Insomnia (08/2019), Pneumonia, and Sickle cell anemia ([place]).   He is in today for SCD follow up. He reports pain in his legs, arm and back. He reports that he has an eye exam last year. He does have occasional headache. He rest or wears his glasses which is effective. Denies fever, cough, wheezing, shortness of breath, chest pains, abdominal pain, back pain, hip pain, or leg pain. Denies any open wounds, skin irritation. He did suffer a few bug bis but they are healing.  Past Medical History:  Diagnosis Date   Insomnia 08/2019   Pneumonia    Sickle cell anemia (H[facility])     Past Surgical History:  Procedure Laterality Date   NO PAST SURGERIES      Family History  Problem Relation Age of Onset   Cancer Maternal Grandmother        colon    Diabetes Father    Cancer Father 56       Prostate   Asthma Brother     Social History   Socioeconomic History   Marital status: Single    Spouse name: Not on file   Number of children: Not on file   Years of education: Not on file   Highest education level: Not on file  Occupational History   Not on file  Tobacco Use   Smoking status: Some Days    Packs/day: 0.25    Years: 5.00    Pack years: 1.25    Types: Cigarettes   Smokeless tobacco: Never  Vaping Use   Vaping Use: Never used  Substance and Sexual Activity   Alcohol use: No    Alcohol/week: 0.0 standard drinks   Drug use: No   Sexual activity: Yes    Birth control/protection: Condom  Other Topics Concern   Not on file  Social History Narrative   Not on file    Social Determinants of Health   Financial Resource Strain: Not on file  Food Insecurity: Not on file  Transportation Needs: Not on file  Physical Activity: Not on file  Stress: Not on file  Social Connections: Not on file  Intimate Partner Violence: Not on file    Outpatient Medications Prior to Visit  Medication Sig Dispense Refill   busPIRone (BUSPAR) 5 MG tablet TAKE 1 TABLET BY MOUTH TWICE A DAY 235 tablet 1   folic acid (FOLVITE) 1 MG tablet Take 1 tablet (1 mg total) by mouth daily. 90 tablet 3   gabapentin (NEURONTIN) 600 MG tablet Take 1 tablet (600 mg total) by mouth 2 (two) times daily. Take 1 capsule, by mouth, 2 times a day. 60 tablet 11   hydroxyurea (HYDREA) 500 MG capsule Take 3 capsules (1,500 mg total) by mouth daily. May take with food to minimize GI side effects. 270 capsule 3   ibuprofen (ADVIL) 800 MG tablet Take 1 tablet (800 mg total) by mouth every 8 (eight) hours as needed for mild pain. 90 tablet 0   Multiple Vitamin (MULTIVITAMIN) tablet Take  2 tablets by mouth daily.      [START ON 04/11/2021] Oxycodone HCl 20 MG TABS Take 1 tablet (20 mg total) by mouth 4 (four) times daily as needed for up to 15 days. 60 tablet 0   traZODone (DESYREL) 150 MG tablet Take 1 tablet (150 mg total) by mouth at bedtime. 30 tablet 11   triamcinolone (KENALOG) 0.025 % ointment Apply 1 application topically 2 (two) times daily. 30 g 2   REXULTI 1 MG TABS  (Patient not taking: Reported on 04/10/2021)     No facility-administered medications prior to visit.    Allergies  Allergen Reactions   Amoxicillin Diarrhea    Did it involve swelling of the face/tongue/throat, SOB, or low BP? No Did it involve sudden or severe rash/hives, skin peeling, or any reaction on the inside of your mouth or nose? No Did you need to seek medical attention at a hospital or doctor's office? No When did it last happen?      childhood If all above answers are "NO", may proceed with cephalosporin use.      ROS Review of Systems    Objective:    Physical Exam Constitutional:      Appearance: He is obese. He is not ill-appearing, toxic-appearing or diaphoretic.  HENT:     Head: Normocephalic and atraumatic.     Nose: Nose normal.     Mouth/Throat:     Mouth: Mucous membranes are moist.  Cardiovascular:     Rate and Rhythm: Normal rate and regular rhythm.     Pulses: Normal pulses.     Heart sounds: Normal heart sounds.  Pulmonary:     Effort: Pulmonary effort is normal.     Breath sounds: Normal breath sounds.  Abdominal:     General: Bowel sounds are normal.     Palpations: Abdomen is soft.  Musculoskeletal:        General: No swelling or tenderness. Normal range of motion.     Cervical back: Normal range of motion.     Right lower leg: No edema.     Left lower leg: No edema.  Skin:    General: Skin is warm and dry.     Capillary Refill: Capillary refill takes less than 2 seconds.  Neurological:     General: No focal deficit present.     Mental Status: He is alert and oriented to person, place, and time.  Psychiatric:        Mood and Affect: Mood normal.        Behavior: Behavior normal.        Thought Content: Thought content normal.        Judgment: Judgment normal.   BP 116/71 (BP Location: Right Arm, Patient Position: Sitting)   Pulse 81   Temp 98.2 F (36.8 C)   Ht 5' 8" (1.727 m)   Wt 185 lb 0.8 oz (83.9 kg)   SpO2 96%   BMI 28.14 kg/m  Wt Readings from Last 3 Encounters:  04/10/21 185 lb 0.8 oz (83.9 kg)  08/28/20 186 lb (84.4 kg)  05/29/20 184 lb 12.8 oz (83.8 kg)     There are no preventive care reminders to display for this patient.   There are no preventive care reminders to display for this patient.  Lab Results  Component Value Date   TSH 0.78 09/11/2016   Lab Results  Component Value Date   WBC 22.2 (HH) 08/28/2020   HGB 10.2 (L) 08/28/2020  HCT 30.4 (L) 08/28/2020   MCV 92 08/28/2020   PLT 689 (H) 08/28/2020   Lab Results   Component Value Date   NA 136 08/28/2020   K 4.7 08/28/2020   CO2 24 07/31/2020   GLUCOSE 70 08/28/2020   BUN 9 08/28/2020   CREATININE 0.77 08/28/2020   BILITOT 1.4 (H) 08/28/2020   ALKPHOS 87 08/28/2020   AST 46 (H) 08/28/2020   ALT 28 07/31/2020   PROT 8.5 08/28/2020   ALBUMIN 5.1 (H) 08/28/2020   CALCIUM 9.5 08/28/2020   ANIONGAP 10 07/31/2020   EGFR 120 08/28/2020   No results found for: CHOL No results found for: HDL No results found for: LDLCALC No results found for: TRIG No results found for: CHOLHDL Lab Results  Component Value Date   HGBA1C <4.0 09/07/2013      Assessment & Plan:   Problem List Items Addressed This Visit       Other   Hb-SS disease without crisis ([place]) - Primary We discussed the need for good hydration, monitoring of hydration status, avoidance of heat, cold, stress, and infection triggers. We discussed the risks and benefits of Hydrea, including bone marrow suppression, the possibility of GI upset, skin ulcers, hair thinning, and teratogenicity. The patient was reminded of the need to seek medical attention of any symptoms of bleeding, anemia, or infection. Continue folic acid 1 mg daily to prevent aplastic bone marrow crises.    Relevant Orders   Sickle Cell Panel   POCT URINALYSIS DIP (CLINITEK) (Completed)   Chronic prescription opiate use   Relevant Orders   Sickle Cell Panel   Other Visit Diagnoses     Constipation due to opioid therapy     Encouraged hydration with water at least 8-10 8 ounce glasses per day Eat foods that have a lot of fiber, such as: Fresh fruits and vegetables. Whole grains. Beans. Eat less of foods that are high in fat, low in fiber, or overly processed Encouraged regular daily exercise starting with walking 20 minutes per day    Chronic pain syndrome       Relevant Orders   Sickle Cell Panel   Screening for cholesterol level       Relevant Orders   Lipid panel       Meds ordered this encounter   Medications   naloxegol oxalate (MOVANTIK) 25 MG TABS tablet    Sig: Take 1 tablet (25 mg total) by mouth daily. Take on an empty stomach at least 1 hour before or 2 hours after a meal.    Dispense:  30 tablet    Refill:  0    Please instruct the patient not to break the tablet.    Order Specific Question:   Supervising Provider    Answer:   Tresa Garter [[id number]]    Follow-up: Return in about 3 months (around 07/11/2021) for Follow up SCD [id number].    Vevelyn Francois, NP

## 2021-04-11 LAB — CMP14+CBC/D/PLT+FER+RETIC+V...
ALT: 25 IU/L (ref 0–44)
AST: 53 IU/L — ABNORMAL HIGH (ref 0–40)
Albumin/Globulin Ratio: 1.6 (ref 1.2–2.2)
Albumin: 4.5 g/dL (ref 4.0–5.0)
Alkaline Phosphatase: 66 IU/L (ref 44–121)
BUN/Creatinine Ratio: 9 (ref 9–20)
BUN: 6 mg/dL (ref 6–20)
Basophils Absolute: 0.1 10*3/uL (ref 0.0–0.2)
Basos: 1 %
Bilirubin Total: 2.1 mg/dL — ABNORMAL HIGH (ref 0.0–1.2)
CO2: 22 mmol/L (ref 20–29)
Calcium: 9.3 mg/dL (ref 8.7–10.2)
Chloride: 97 mmol/L (ref 96–106)
Creatinine, Ser: 0.65 mg/dL — ABNORMAL LOW (ref 0.76–1.27)
EOS (ABSOLUTE): 0.7 10*3/uL — ABNORMAL HIGH (ref 0.0–0.4)
Eos: 4 %
Ferritin: 864 ng/mL — ABNORMAL HIGH (ref 30–400)
Globulin, Total: 2.9 g/dL (ref 1.5–4.5)
Glucose: 55 mg/dL — ABNORMAL LOW (ref 70–99)
Hematocrit: 28.4 % — ABNORMAL LOW (ref 37.5–51.0)
Hemoglobin: 9.4 g/dL — ABNORMAL LOW (ref 13.0–17.7)
Immature Grans (Abs): 0.1 10*3/uL (ref 0.0–0.1)
Immature Granulocytes: 1 %
Lymphocytes Absolute: 6.8 10*3/uL — ABNORMAL HIGH (ref 0.7–3.1)
Lymphs: 36 %
MCH: 31.1 pg (ref 26.6–33.0)
MCHC: 33.1 g/dL (ref 31.5–35.7)
MCV: 94 fL (ref 79–97)
Monocytes Absolute: 1.5 10*3/uL — ABNORMAL HIGH (ref 0.1–0.9)
Monocytes: 8 %
NRBC: 13 % — ABNORMAL HIGH (ref 0–0)
Neutrophils Absolute: 9.6 10*3/uL — ABNORMAL HIGH (ref 1.4–7.0)
Neutrophils: 50 %
Platelets: 547 10*3/uL — ABNORMAL HIGH (ref 150–450)
Potassium: 5 mmol/L (ref 3.5–5.2)
RBC: 3.02 x10E6/uL — ABNORMAL LOW (ref 4.14–5.80)
RDW: 19.8 % — ABNORMAL HIGH (ref 11.6–15.4)
Retic Ct Pct: 11.6 % — ABNORMAL HIGH (ref 0.6–2.6)
Sodium: 137 mmol/L (ref 134–144)
Total Protein: 7.4 g/dL (ref 6.0–8.5)
Vit D, 25-Hydroxy: 30.8 ng/mL (ref 30.0–100.0)
WBC: 18.7 10*3/uL — ABNORMAL HIGH (ref 3.4–10.8)
eGFR: 126 mL/min/{1.73_m2} (ref >59)

## 2021-04-11 LAB — LIPID PANEL
Chol/HDL Ratio: 4.4 ratio (ref 0.0–5.0)
Cholesterol, Total: 136 mg/dL (ref 100–199)
HDL: 31 mg/dL — ABNORMAL LOW (ref >39)
LDL Chol Calc (NIH): 84 mg/dL (ref 0–99)
Triglycerides: 111 mg/dL (ref 0–149)
VLDL Cholesterol Cal: 21 mg/dL (ref 5–40)

## 2021-04-24 ENCOUNTER — Other Ambulatory Visit: Payer: Self-pay | Admitting: Nurse Practitioner

## 2021-04-24 ENCOUNTER — Telehealth: Payer: Self-pay

## 2021-04-24 DIAGNOSIS — G894 Chronic pain syndrome: Secondary | ICD-10-CM

## 2021-04-24 DIAGNOSIS — D571 Sickle-cell disease without crisis: Secondary | ICD-10-CM

## 2021-04-24 MED ORDER — OXYCODONE HCL 20 MG PO TABS: 1.0000 | ORAL_TABLET | Freq: Four times a day (QID) | ORAL | 0 refills | Status: DC | PRN

## 2021-04-24 NOTE — Telephone Encounter (Signed)
Oxycodone 20 mg °

## 2021-05-08 ENCOUNTER — Other Ambulatory Visit: Payer: Self-pay | Admitting: Nurse Practitioner

## 2021-05-08 ENCOUNTER — Telehealth: Payer: Self-pay

## 2021-05-08 DIAGNOSIS — G894 Chronic pain syndrome: Secondary | ICD-10-CM

## 2021-05-08 DIAGNOSIS — D571 Sickle-cell disease without crisis: Secondary | ICD-10-CM

## 2021-05-08 MED ORDER — OXYCODONE HCL 20 MG PO TABS: 1.0000 | ORAL_TABLET | Freq: Four times a day (QID) | ORAL | 0 refills | Status: DC | PRN

## 2021-05-08 NOTE — Telephone Encounter (Signed)
Oxycodone 20 mg °

## 2021-05-23 ENCOUNTER — Telehealth: Payer: Self-pay

## 2021-05-23 NOTE — Telephone Encounter (Signed)
Oxycodone  °

## 2021-05-26 ENCOUNTER — Telehealth: Payer: Self-pay

## 2021-05-26 ENCOUNTER — Other Ambulatory Visit: Payer: Self-pay | Admitting: Nurse Practitioner

## 2021-05-26 DIAGNOSIS — G894 Chronic pain syndrome: Secondary | ICD-10-CM

## 2021-05-26 DIAGNOSIS — D571 Sickle-cell disease without crisis: Secondary | ICD-10-CM

## 2021-05-26 MED ORDER — OXYCODONE HCL 20 MG PO TABS: 1.0000 | ORAL_TABLET | Freq: Four times a day (QID) | ORAL | 0 refills | Status: DC | PRN

## 2021-05-26 NOTE — Telephone Encounter (Signed)
Oxycodone 20 due today

## 2021-05-26 NOTE — Telephone Encounter (Signed)
Oxy

## 2021-06-06 ENCOUNTER — Telehealth: Payer: Self-pay

## 2021-06-06 ENCOUNTER — Other Ambulatory Visit: Payer: Self-pay | Admitting: Nurse Practitioner

## 2021-06-06 DIAGNOSIS — D571 Sickle-cell disease without crisis: Secondary | ICD-10-CM

## 2021-06-06 DIAGNOSIS — G894 Chronic pain syndrome: Secondary | ICD-10-CM

## 2021-06-06 MED ORDER — OXYCODONE HCL 20 MG PO TABS: 1.0000 | ORAL_TABLET | Freq: Four times a day (QID) | ORAL | 0 refills | Status: DC | PRN

## 2021-06-06 NOTE — Telephone Encounter (Signed)
Oxycodone 20 mg °

## 2021-06-24 ENCOUNTER — Telehealth: Payer: Self-pay

## 2021-06-24 NOTE — Telephone Encounter (Signed)
Oxycodone 20 mg °

## 2021-06-25 ENCOUNTER — Other Ambulatory Visit: Payer: Self-pay | Admitting: Nurse Practitioner

## 2021-06-25 DIAGNOSIS — D571 Sickle-cell disease without crisis: Secondary | ICD-10-CM

## 2021-06-25 DIAGNOSIS — G894 Chronic pain syndrome: Secondary | ICD-10-CM

## 2021-06-25 MED ORDER — OXYCODONE HCL 20 MG PO TABS: 1.0000 | ORAL_TABLET | Freq: Four times a day (QID) | ORAL | 0 refills | Status: DC | PRN

## 2021-07-08 ENCOUNTER — Telehealth: Payer: Self-pay

## 2021-07-08 NOTE — Telephone Encounter (Signed)
Oxycodone  °

## 2021-07-09 ENCOUNTER — Other Ambulatory Visit: Payer: Self-pay | Admitting: Nurse Practitioner

## 2021-07-09 DIAGNOSIS — G894 Chronic pain syndrome: Secondary | ICD-10-CM

## 2021-07-09 DIAGNOSIS — D571 Sickle-cell disease without crisis: Secondary | ICD-10-CM

## 2021-07-09 MED ORDER — OXYCODONE HCL 20 MG PO TABS: 1.0000 | ORAL_TABLET | Freq: Four times a day (QID) | ORAL | 0 refills | Status: DC | PRN

## 2021-07-11 ENCOUNTER — Other Ambulatory Visit: Payer: Self-pay

## 2021-07-11 ENCOUNTER — Encounter: Payer: Self-pay | Admitting: Nurse Practitioner

## 2021-07-11 ENCOUNTER — Ambulatory Visit: Payer: Medicaid Other | Admitting: Nurse Practitioner

## 2021-07-11 VITALS — BP 113/77 | HR 75 | Temp 97.6°F | Ht 68.0 in | Wt 182.2 lb

## 2021-07-11 DIAGNOSIS — F1120 Opioid dependence, uncomplicated: Secondary | ICD-10-CM | POA: Diagnosis not present

## 2021-07-11 DIAGNOSIS — R0789 Other chest pain: Secondary | ICD-10-CM

## 2021-07-11 DIAGNOSIS — R1012 Left upper quadrant pain: Secondary | ICD-10-CM

## 2021-07-11 DIAGNOSIS — Z79891 Long term (current) use of opiate analgesic: Secondary | ICD-10-CM | POA: Diagnosis not present

## 2021-07-11 DIAGNOSIS — Z1329 Encounter for screening for other suspected endocrine disorder: Secondary | ICD-10-CM

## 2021-07-11 DIAGNOSIS — D571 Sickle-cell disease without crisis: Secondary | ICD-10-CM

## 2021-07-11 DIAGNOSIS — F200 Paranoid schizophrenia: Secondary | ICD-10-CM

## 2021-07-11 DIAGNOSIS — G894 Chronic pain syndrome: Secondary | ICD-10-CM

## 2021-07-11 NOTE — Patient Instructions (Signed)
Sickle Cell Anemia, Adult Sickle cell anemia is a condition where your red blood cells are shaped like sickles. Red blood cells carry oxygen through the body. Sickle-shaped cells do not live as long as normal red blood cells. They also clump together and block blood from flowing through the blood vessels. This prevents the body from getting enough oxygen. Sickle cell anemia causes organ damage and pain. It also increases the risk of infection. Follow these instructions at home: Medicines Take over-the-counter and prescription medicines only as told by your doctor. If you were prescribed an antibiotic medicine, take it as told by your doctor. Do not stop taking the antibiotic even if you start to feel better. If you develop a fever, do not take medicines to lower the fever right away. Tell your doctor about the fever. Managing pain, stiffness, and swelling Try these methods to help with pain: Use a heating pad. Take a warm bath. Distract yourself, such as by watching TV. Eating and drinking Drink enough fluid to keep your pee (urine) clear or pale yellow. Drink more in hot weather and during exercise. Limit or avoid alcohol. Eat a healthy diet. Eat plenty of fruits, vegetables, whole grains, and lean protein. Take vitamins and supplements as told by your doctor. Traveling When traveling, keep these with you: Your medical information. The names of your doctors. Your medicines. If you need to take an airplane, talk to your doctor first. Activity Rest often. Avoid exercises that make your heart beat much faster, such as jogging. General instructions Do not use products that have nicotine or tobacco, such as cigarettes and e-cigarettes. If you need help quitting, ask your doctor. Consider wearing a medical alert bracelet. Avoid being in high places (high altitudes), such as mountains. Avoid very hot or cold temperatures. Avoid places where the temperature changes a lot. Keep all follow-up  visits as told by your doctor. This is important. Contact a doctor if: A joint hurts. Your feet or hands hurt or swell. You feel tired (fatigued). Get help right away if: You have symptoms of infection. These include: Fever. Chills. Being very tired. Irritability. Poor eating. Throwing up (vomiting). You feel dizzy or faint. You have new stomach pain, especially on the left side. You have a an erection (priapism) that lasts more than 4 hours. You have numbness in your arms or legs. You have a hard time moving your arms or legs. You have trouble talking. You have pain that does not go away when you take medicine. You are short of breath. You are breathing fast. You have a long-term cough. You have pain in your chest. You have a bad headache. You have a stiff neck. Your stomach looks bloated even though you did not eat much. Your skin is pale. You suddenly cannot see well. Summary Sickle cell anemia is a condition where your red blood cells are shaped like sickles. Follow your doctor's advice on ways to manage pain, food to eat, activities to do, and steps to take for safe travel. Get medical help right away if you have any signs of infection, such as a fever. This information is not intended to replace advice given to you by your health care provider. Make sure you discuss any questions you have with your health care provider. Document Revised: 11/02/2019 Document Reviewed: 11/02/2019 Elsevier Patient Education  [place].

## 2021-07-11 NOTE — Progress Notes (Signed)
[place], [place]  [address] Phone:  [phone]   Fax:  [phone]   Established Patient Office Visit  Subjective:  Patient ID: Alejandro Soto, male    DOB: 01/16/1986  Age: 36 y.o. MRN: [id number]  CC:  Chief Complaint  Patient presents with   Follow-up    Pt is here today for his follow up visit. Pt states that he has been having muscle spasms in left side off and on x 2 months.    HPI Alejandro Soto presents for follow up. He  has a past medical history of Insomnia (08/2019), Pneumonia, and Sickle cell anemia ([place]).   He is having left sided abdominal pain. The pain is on and off. He denies any nausea or vomiting. He has not had any chest pain or shortness of breath. He then states that he does have intermittent pain in the upper chest on the left. The pain comes and goes. He feels a "fast sensation".  He does not think it is his heart but a spasm. He denies performing any activity that he is causing problems. He noticed this on yesterday and he was just visiting a family member. Denies fever, headache, cough, wheezing, back pain, hip pain, or leg pain.   Past Medical History:  Diagnosis Date   Insomnia 08/2019   Pneumonia    Sickle cell anemia (HCC)     Past Surgical History:  Procedure Laterality Date   NO PAST SURGERIES      Family History  Problem Relation Age of Onset   Cancer Maternal Grandmother        colon    Diabetes Father    Cancer Father 27       Prostate   Asthma Brother     Social History   Socioeconomic History   Marital status: Single    Spouse name: Not on file   Number of children: Not on file   Years of education: Not on file   Highest education level: Not on file  Occupational History   Not on file  Tobacco Use   Smoking status: Some Days    Packs/day: 0.25    Years: 5.00    Pack years: 1.25    Types: Cigarettes   Smokeless tobacco: Never  Vaping Use   Vaping Use: Never used   Substance and Sexual Activity   Alcohol use: No    Alcohol/week: 0.0 standard drinks   Drug use: No   Sexual activity: Yes    Birth control/protection: Condom  Other Topics Concern   Not on file  Social History Narrative   Not on file   Social Determinants of Health   Financial Resource Strain: Not on file  Food Insecurity: Not on file  Transportation Needs: Not on file  Physical Activity: Not on file  Stress: Not on file  Social Connections: Not on file  Intimate Partner Violence: Not on file    Outpatient Medications Prior to Visit  Medication Sig Dispense Refill   busPIRone (BUSPAR) 5 MG tablet TAKE 1 TABLET BY MOUTH TWICE A DAY 180 tablet 1   gabapentin (NEURONTIN) 600 MG tablet Take 1 tablet (600 mg total) by mouth 2 (two) times daily. Take 1 capsule, by mouth, 2 times a day. 60 tablet 11   hydroxyurea (HYDREA) 500 MG capsule Take 3 capsules (1,500 mg total) by mouth daily. May take with food to minimize GI side effects. 270 capsule 3  ibuprofen (ADVIL) 800 MG tablet Take 1 tablet (800 mg total) by mouth every 8 (eight) hours as needed for mild pain. 90 tablet 0   Multiple Vitamin (MULTIVITAMIN) tablet Take 2 tablets by mouth daily.      Oxycodone HCl 20 MG TABS Take 1 tablet (20 mg total) by mouth 4 (four) times daily as needed for up to 15 days. 60 tablet 0   REXULTI 1 MG TABS      traZODone (DESYREL) 150 MG tablet Take 1 tablet (150 mg total) by mouth at bedtime. 30 tablet 11   triamcinolone (KENALOG) 0.025 % ointment Apply 1 application topically 2 (two) times daily. 30 g 2   No facility-administered medications prior to visit.    Allergies  Allergen Reactions   Amoxicillin Diarrhea    Did it involve swelling of the face/tongue/throat, SOB, or low BP? No Did it involve sudden or severe rash/hives, skin peeling, or any reaction on the inside of your mouth or nose? No Did you need to seek medical attention at a hospital or doctor's office? No When did it last  happen?      childhood If all above answers are NO, may proceed with cephalosporin use.     ROS Review of Systems    Objective:    Physical Exam  BP 113/77    Pulse 75    Temp 97.6 F (36.4 C)    Ht '5\' 8"'  (1.727 m)    Wt 182 lb 3.2 oz (82.6 kg)    SpO2 98%    BMI 27.70 kg/m  Wt Readings from Last 3 Encounters:  07/11/21 182 lb 3.2 oz (82.6 kg)  04/10/21 185 lb 0.8 oz (83.9 kg)  08/28/20 186 lb (84.4 kg)     There are no preventive care reminders to display for this patient.   There are no preventive care reminders to display for this patient.  Lab Results  Component Value Date   TSH 0.78 09/11/2016   Lab Results  Component Value Date   WBC 18.7 (H) 04/10/2021   HGB 9.4 (L) 04/10/2021   HCT 28.4 (L) 04/10/2021   MCV 94 04/10/2021   PLT 547 (H) 04/10/2021   Lab Results  Component Value Date   NA 137 04/10/2021   K 5.0 04/10/2021   CO2 22 04/10/2021   GLUCOSE 55 (L) 04/10/2021   BUN 6 04/10/2021   CREATININE 0.65 (L) 04/10/2021   BILITOT 2.1 (H) 04/10/2021   ALKPHOS 66 04/10/2021   AST 53 (H) 04/10/2021   ALT 25 04/10/2021   PROT 7.4 04/10/2021   ALBUMIN 4.5 04/10/2021   CALCIUM 9.3 04/10/2021   ANIONGAP 10 07/31/2020   EGFR 126 04/10/2021   Lab Results  Component Value Date   CHOL 136 04/10/2021   Lab Results  Component Value Date   HDL 31 (L) 04/10/2021   Lab Results  Component Value Date   LDLCALC 84 04/10/2021   Lab Results  Component Value Date   TRIG 111 04/10/2021   Lab Results  Component Value Date   CHOLHDL 4.4 04/10/2021   Lab Results  Component Value Date   HGBA1C <4.0 09/07/2013      Assessment & Plan:   Problem List Items Addressed This Visit       Other   Hb-SS 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 Move frequently to reduce venous thromboembolism risk. Avoid situations that could lead to dehydration or could exacerbate pain Discussed S&S of infection, seizures,  stroke acute chest,  DVT and how important it is to seek medical attention Take medication as directed along with pain contract and overall compliance Discussed the risk related to opiate use (addition, tolerance and dependency)    Relevant Orders   Sickle Cell Panel   Chronic prescription opiate use   Relevant Orders   [id number] 11+Oxyco+Alc+Crt-Bund   Paranoid schizophrenia (HCC) Stable on current buspirone 5 mg, gabapentin 600 mg twice daily and trazodone 150 mg nightly   Opioid dependence, uncomplicated ([place])   Other Visit Diagnoses     Chronic pain syndrome       Relevant Orders   [id number] 11+Oxyco+Alc+Crt-Bund       No orders of the defined types were placed in this encounter.   Follow-up: Return in about 2 months (around 09/08/2021) for Follow up SCD 36629.    Vevelyn Francois, NP

## 2021-07-12 LAB — CMP14+CBC/D/PLT+FER+RETIC+V...
ALT: 41 IU/L (ref 0–44)
AST: 55 IU/L — ABNORMAL HIGH (ref 0–40)
Albumin/Globulin Ratio: 1.5 (ref 1.2–2.2)
Albumin: 4.7 g/dL (ref 4.0–5.0)
Alkaline Phosphatase: 79 IU/L (ref 44–121)
BUN/Creatinine Ratio: 17 (ref 9–20)
BUN: 10 mg/dL (ref 6–20)
Basophils Absolute: 0.1 10*3/uL (ref 0.0–0.2)
Basos: 1 %
Bilirubin Total: 1.1 mg/dL (ref 0.0–1.2)
CO2: 23 mmol/L (ref 20–29)
Calcium: 9.4 mg/dL (ref 8.7–10.2)
Chloride: 99 mmol/L (ref 96–106)
Creatinine, Ser: 0.6 mg/dL — ABNORMAL LOW (ref 0.76–1.27)
EOS (ABSOLUTE): 0.4 10*3/uL (ref 0.0–0.4)
Eos: 3 %
Ferritin: 855 ng/mL — ABNORMAL HIGH (ref 30–400)
Globulin, Total: 3.2 g/dL (ref 1.5–4.5)
Glucose: 61 mg/dL — ABNORMAL LOW (ref 70–99)
Hematocrit: 28.8 % — ABNORMAL LOW (ref 37.5–51.0)
Hemoglobin: 10.1 g/dL — ABNORMAL LOW (ref 13.0–17.7)
Immature Grans (Abs): 0.1 10*3/uL (ref 0.0–0.1)
Immature Granulocytes: 1 %
Lymphocytes Absolute: 5.1 10*3/uL — ABNORMAL HIGH (ref 0.7–3.1)
Lymphs: 37 %
MCH: 33.4 pg — ABNORMAL HIGH (ref 26.6–33.0)
MCHC: 35.1 g/dL (ref 31.5–35.7)
MCV: 95 fL (ref 79–97)
Monocytes Absolute: 1.2 10*3/uL — ABNORMAL HIGH (ref 0.1–0.9)
Monocytes: 9 %
NRBC: 2 % — ABNORMAL HIGH (ref 0–0)
Neutrophils Absolute: 7 10*3/uL (ref 1.4–7.0)
Neutrophils: 49 %
Platelets: 484 10*3/uL — ABNORMAL HIGH (ref 150–450)
Potassium: 4.7 mmol/L (ref 3.5–5.2)
RBC: 3.02 x10E6/uL — ABNORMAL LOW (ref 4.14–5.80)
RDW: 16.5 % — ABNORMAL HIGH (ref 11.6–15.4)
Retic Ct Pct: 7.1 % — ABNORMAL HIGH (ref 0.6–2.6)
Sodium: 137 mmol/L (ref 134–144)
Total Protein: 7.9 g/dL (ref 6.0–8.5)
Vit D, 25-Hydroxy: 32.9 ng/mL (ref 30.0–100.0)
WBC: 13.9 10*3/uL — ABNORMAL HIGH (ref 3.4–10.8)
eGFR: 129 mL/min/{1.73_m2} (ref >59)

## 2021-07-12 LAB — TSH: TSH: 1.07 u[IU]/mL (ref 0.450–4.500)

## 2021-07-12 LAB — LIPASE: Lipase: 50 U/L (ref 13–78)

## 2021-07-12 LAB — AMYLASE: Amylase: 90 U/L (ref 31–110)

## 2021-07-16 LAB — DRUG SCREEN 764883 11+OXYCO+ALC+CRT-BUND
Amphetamines, Urine: NEGATIVE ng/mL
BENZODIAZ UR QL: NEGATIVE ng/mL
Barbiturate: NEGATIVE ng/mL
Cannabinoid Quant, Ur: NEGATIVE ng/mL
Cocaine (Metabolite): NEGATIVE ng/mL
Creatinine: 44.3 mg/dL (ref 20.0–300.0)
Ethanol: NEGATIVE %
Meperidine: NEGATIVE ng/mL
Methadone Screen, Urine: NEGATIVE ng/mL
OPIATE SCREEN URINE: NEGATIVE ng/mL
Phencyclidine: NEGATIVE ng/mL
Propoxyphene: NEGATIVE ng/mL
Tramadol: NEGATIVE ng/mL
pH, Urine: 7.3 (ref 4.5–8.9)

## 2021-07-16 LAB — OXYCODONE/OXYMORPHONE, CONFIRM
OXYCODONE/OXYMORPH: POSITIVE — AB
OXYCODONE: 1583 ng/mL
OXYCODONE: POSITIVE — AB
OXYMORPHONE (GC/MS): 690 ng/mL
OXYMORPHONE: POSITIVE — AB

## 2021-07-22 ENCOUNTER — Telehealth: Payer: Self-pay

## 2021-07-22 NOTE — Telephone Encounter (Signed)
Oxycodone  °

## 2021-07-23 ENCOUNTER — Other Ambulatory Visit: Payer: Self-pay | Admitting: Nurse Practitioner

## 2021-07-23 DIAGNOSIS — G894 Chronic pain syndrome: Secondary | ICD-10-CM

## 2021-07-23 DIAGNOSIS — D571 Sickle-cell disease without crisis: Secondary | ICD-10-CM

## 2021-07-23 MED ORDER — OXYCODONE HCL 20 MG PO TABS: 1.0000 | ORAL_TABLET | Freq: Four times a day (QID) | ORAL | 0 refills | Status: DC | PRN

## 2021-08-06 ENCOUNTER — Telehealth: Payer: Self-pay

## 2021-08-06 NOTE — Telephone Encounter (Signed)
Oxycodone  °

## 2021-08-07 ENCOUNTER — Other Ambulatory Visit: Payer: Self-pay | Admitting: Nurse Practitioner

## 2021-08-07 DIAGNOSIS — D571 Sickle-cell disease without crisis: Secondary | ICD-10-CM

## 2021-08-07 DIAGNOSIS — G894 Chronic pain syndrome: Secondary | ICD-10-CM

## 2021-08-07 MED ORDER — OXYCODONE HCL 20 MG PO TABS: 1.0000 | ORAL_TABLET | Freq: Four times a day (QID) | ORAL | 0 refills | Status: DC | PRN

## 2021-08-07 NOTE — Telephone Encounter (Signed)
The refill has been sent. Thanks

## 2021-08-20 ENCOUNTER — Telehealth: Payer: Self-pay

## 2021-08-20 ENCOUNTER — Other Ambulatory Visit: Payer: Self-pay | Admitting: Nurse Practitioner

## 2021-08-20 DIAGNOSIS — G894 Chronic pain syndrome: Secondary | ICD-10-CM

## 2021-08-20 DIAGNOSIS — D571 Sickle-cell disease without crisis: Secondary | ICD-10-CM

## 2021-08-20 MED ORDER — OXYCODONE HCL 20 MG PO TABS: 1.0000 | ORAL_TABLET | Freq: Four times a day (QID) | ORAL | 0 refills | Status: DC | PRN

## 2021-08-20 NOTE — Telephone Encounter (Signed)
Oxycodone  °

## 2021-08-20 NOTE — Telephone Encounter (Signed)
The refill has been sent. Thanks  ? ?

## 2021-09-08 ENCOUNTER — Other Ambulatory Visit: Payer: Self-pay

## 2021-09-08 ENCOUNTER — Encounter: Payer: Self-pay | Admitting: Nurse Practitioner

## 2021-09-08 ENCOUNTER — Ambulatory Visit (INDEPENDENT_AMBULATORY_CARE_PROVIDER_SITE_OTHER): Payer: Medicaid Other | Admitting: Nurse Practitioner

## 2021-09-08 VITALS — BP 114/73 | HR 74 | Temp 98.2°F | Ht 68.0 in | Wt 189.0 lb

## 2021-09-08 DIAGNOSIS — D571 Sickle-cell disease without crisis: Secondary | ICD-10-CM

## 2021-09-08 DIAGNOSIS — G894 Chronic pain syndrome: Secondary | ICD-10-CM

## 2021-09-08 DIAGNOSIS — F200 Paranoid schizophrenia: Secondary | ICD-10-CM | POA: Diagnosis not present

## 2021-09-08 DIAGNOSIS — Z79891 Long term (current) use of opiate analgesic: Secondary | ICD-10-CM | POA: Diagnosis not present

## 2021-09-08 LAB — POCT URINALYSIS DIP (CLINITEK)
Bilirubin, UA: NEGATIVE
Blood, UA: NEGATIVE
Glucose, UA: NEGATIVE mg/dL
Ketones, POC UA: NEGATIVE mg/dL
Leukocytes, UA: NEGATIVE
Nitrite, UA: NEGATIVE
POC PROTEIN,UA: 30 — AB
Spec Grav, UA: 1.015 (ref 1.010–1.025)
Urobilinogen, UA: 4 E.U./dL — AB
pH, UA: 5.5 (ref 5.0–8.0)

## 2021-09-08 MED ORDER — OXYCODONE HCL 20 MG PO TABS: 1.0000 | ORAL_TABLET | Freq: Four times a day (QID) | ORAL | 0 refills | Status: DC | PRN

## 2021-09-08 NOTE — Patient Instructions (Addendum)
[facility]  ?[place], [place] [address].  ?[phone]  ? ?Sickle Cell Anemia, Adult ?Sickle cell anemia is a condition where your red blood cells are shaped like sickles. Red blood cells carry oxygen through the body. Sickle-shaped cells do not live as long as normal red blood cells. They also clump together and block blood from flowing through the blood vessels. This prevents the body from getting enough oxygen. Sickle cell anemia causes organ damage and pain. It also increases the risk of infection. ?Follow these instructions at home: ?Medicines ?Take over-the-counter and prescription medicines only as told by your doctor. ?If you were prescribed an antibiotic medicine, take it as told by your doctor. Do not stop taking the antibiotic even if you start to feel better. ?If you develop a fever, do not take medicines to lower the fever right away. Tell your doctor about the fever. ?Managing pain, stiffness, and swelling ?Try these methods to help with pain: ?Use a heating pad. ?Take a warm bath. ?Distract yourself, such as by watching TV. ?Eating and drinking ?Drink enough fluid to keep your pee (urine) clear or pale yellow. Drink more in hot weather and during exercise. ?Limit or avoid alcohol. ?Eat a healthy diet. Eat plenty of fruits, vegetables, whole grains, and lean protein. ?Take vitamins and supplements as told by your doctor. ?Traveling ?When traveling, keep these with you: ?Your medical information. ?The names of your doctors. ?Your medicines. ?If you need to take an airplane, talk to your doctor first. ?Activity ?Rest often. ?Avoid exercises that make your heart beat much faster, such as jogging. ?General instructions ?Do not use products that have nicotine or tobacco, such as cigarettes and e-cigarettes. If you need help quitting, ask your doctor. ?Consider wearing a medical alert bracelet. ?Avoid being in high places (high altitudes), such as mountains. ?Avoid very  hot or cold temperatures. ?Avoid places where the temperature changes a lot. ?Keep all follow-up visits as told by your doctor. This is important. ?Contact a doctor if: ?A joint hurts. ?Your feet or hands hurt or swell. ?You feel tired (fatigued). ?Get help right away if: ?You have symptoms of infection. These include: ?Fever. ?Chills. ?Being very tired. ?Irritability. ?Poor eating. ?Throwing up (vomiting). ?You feel dizzy or faint. ?You have new stomach pain, especially on the left side. ?You have a an erection (priapism) that lasts more than 4 hours. ?You have numbness in your arms or legs. ?You have a hard time moving your arms or legs. ?You have trouble talking. ?You have pain that does not go away when you take medicine. ?You are short of breath. ?You are breathing fast. ?You have a long-term cough. ?You have pain in your chest. ?You have a bad headache. ?You have a stiff neck. ?Your stomach looks bloated even though you did not eat much. ?Your skin is pale. ?You suddenly cannot see well. ?Summary ?Sickle cell anemia is a condition where your red blood cells are shaped like sickles. ?Follow your doctor's advice on ways to manage pain, food to eat, activities to do, and steps to take for safe travel. ?Get medical help right away if you have any signs of infection, such as a fever. ?This information is not intended to replace advice given to you by your health care provider. Make sure you discuss any questions you have with your health care provider. ?Document Revised: 11/02/2019 Document Reviewed: 11/02/2019 ?Elsevier Patient Education ? [place]. ? ?

## 2021-09-08 NOTE — Progress Notes (Signed)
? ?[facility] ?[place], [place]  [address] ?Phone:  [phone]   Fax:  [phone] ? ? ?Established Patient Office Visit ? ?Subjective:  ?Patient ID: Alejandro Soto, male    DOB: [birth year]  Age: 36 y.o. MRN: [id number] ? ?CC:  ?Chief Complaint  ?Patient presents with  ? Follow-up  ?  Patient is here today for his 2 month follow up visit with no concerns or issues to discuss.  ? ? ?HPI ?Alejandro Soto presents for follow up. He  has a past medical history of Insomnia (08/2019), Pneumonia, and Sickle cell anemia ([place]).  ? ?He is doing well today. He denies any pain. He is taking his medication as prescribed. Denies fever, headache, cough, wheezing, shortness of breath, chest pains, abdominal pain, back pain, hip pain, or leg pain. Denies any open wounds, skin irritation. Last eye exam is be scheduled.    ? ?He does follow up with psychiatry for his schizophrenia.  ?Past Medical History:  ?Diagnosis Date  ? Insomnia 08/2019  ? Pneumonia   ? Sickle cell anemia (HCC)   ? ? ?Past Surgical History:  ?Procedure Laterality Date  ? NO PAST SURGERIES    ? ? ?Family History  ?Problem Relation Age of Onset  ? Cancer Maternal Grandmother   ?     colon   ? Diabetes Father   ? Cancer Father 3  ?     Prostate  ? Asthma Brother   ? ? ?Social History  ? ?Socioeconomic History  ? Marital status: Single  ?  Spouse name: Not on file  ? Number of children: Not on file  ? Years of education: Not on file  ? Highest education level: Not on file  ?Occupational History  ? Not on file  ?Tobacco Use  ? Smoking status: Some Days  ?  Packs/day: 0.25  ?  Years: 5.00  ?  Pack years: 1.25  ?  Types: Cigarettes  ? Smokeless tobacco: Never  ?Vaping Use  ? Vaping Use: Never used  ?Substance and Sexual Activity  ? Alcohol use: No  ?  Alcohol/week: 0.0 standard drinks  ? Drug use: No  ? Sexual activity: Yes  ?  Birth control/protection: Condom  ?Other Topics Concern  ? Not on file  ?Social History Narrative  ? Not on file   ? ?Social Determinants of Health  ? ?Financial Resource Strain: Not on file  ?Food Insecurity: Not on file  ?Transportation Needs: Not on file  ?Physical Activity: Not on file  ?Stress: Not on file  ?Social Connections: Not on file  ?Intimate Partner Violence: Not on file  ? ? ?Outpatient Medications Prior to Visit  ?Medication Sig Dispense Refill  ? busPIRone (BUSPAR) 5 MG tablet TAKE 1 TABLET BY MOUTH TWICE A DAY 180 tablet 1  ? gabapentin (NEURONTIN) 600 MG tablet Take 1 tablet (600 mg total) by mouth 2 (two) times daily. Take 1 capsule, by mouth, 2 times a day. 60 tablet 11  ? hydroxyurea (HYDREA) 500 MG capsule Take 3 capsules (1,500 mg total) by mouth daily. May take with food to minimize GI side effects. 270 capsule 3  ? ibuprofen (ADVIL) 800 MG tablet Take 1 tablet (800 mg total) by mouth every 8 (eight) hours as needed for mild pain. 90 tablet 0  ? Multiple Vitamin (MULTIVITAMIN) tablet Take 2 tablets by mouth daily.     ? traZODone (DESYREL) 150 MG tablet Take 1 tablet (150 mg total)  by mouth at bedtime. 30 tablet 11  ? triamcinolone (KENALOG) 0.025 % ointment Apply 1 application topically 2 (two) times daily. 30 g 2  ? Oxycodone HCl 20 MG TABS Take 1 tablet (20 mg total) by mouth 4 (four) times daily as needed for up to 15 days. 60 tablet 0  ? REXULTI 1 MG TABS  (Patient not taking: Reported on 09/08/2021)    ? risperiDONE (RISPERDAL) 1 MG tablet Take 1 mg by mouth 2 (two) times daily.    ? ?No facility-administered medications prior to visit.  ? ? ?Allergies  ?Allergen Reactions  ? Amoxicillin Diarrhea  ?  Did it involve swelling of the face/tongue/throat, SOB, or low BP? No ?Did it involve sudden or severe rash/hives, skin peeling, or any reaction on the inside of your mouth or nose? No ?Did you need to seek medical attention at a hospital or doctor's office? No ?When did it last happen?      childhood ?If all above answers are ?NO?, may proceed with cephalosporin use. ?  ? ? ?ROS ?Review of Systems ? ?   ?Objective:  ?  ?Physical Exam ?Constitutional:   ?   Appearance: He is obese.  ?HENT:  ?   Head: Normocephalic and atraumatic.  ?   Nose: Nose normal.  ?   Mouth/Throat:  ?   Mouth: Mucous membranes are moist.  ?Musculoskeletal:     ?   General: Normal range of motion.  ?   Cervical back: Normal range of motion.  ?Skin: ?   General: Skin is warm and dry.  ?   Capillary Refill: Capillary refill takes less than 2 seconds.  ?Neurological:  ?   General: No focal deficit present.  ?   Mental Status: He is alert and oriented to person, place, and time.  ? ? ?BP 114/73   Pulse 74   Temp 98.2 ?F (36.8 ?C)   Ht _0  (1.727 m)   Wt 189 lb (85.7 kg)   SpO2 98%   BMI 28.74 kg/m?  ?Wt Readings from Last 3 Encounters:  ?09/08/21 189 lb (85.7 kg)  ?07/11/21 182 lb 3.2 oz (82.6 kg)  ?04/10/21 185 lb 0.8 oz (83.9 kg)  ? ? ? ?Health Maintenance Due  ?Topic Date Due  ? COVID-19 Vaccine (1) Never done  ? ? ?There are no preventive care reminders to display for this patient. ? ?Lab Results  ?Component Value Date  ? TSH 1.070 07/11/2021  ? ?Lab Results  ?Component Value Date  ? WBC 13.9 (H) 07/11/2021  ? HGB 10.1 (L) 07/11/2021  ? HCT 28.8 (L) 07/11/2021  ? MCV 95 07/11/2021  ? PLT 484 (H) 07/11/2021  ? ?Lab Results  ?Component Value Date  ? NA 137 07/11/2021  ? K 4.7 07/11/2021  ? CO2 23 07/11/2021  ? GLUCOSE 61 (L) 07/11/2021  ? BUN 10 07/11/2021  ? CREATININE 0.60 (L) 07/11/2021  ? BILITOT 1.1 07/11/2021  ? ALKPHOS 79 07/11/2021  ? AST 55 (H) 07/11/2021  ? ALT 41 07/11/2021  ? PROT 7.9 07/11/2021  ? ALBUMIN 4.7 07/11/2021  ? CALCIUM 9.4 07/11/2021  ? ANIONGAP 10 07/31/2020  ? EGFR 129 07/11/2021  ? ?Lab Results  ?Component Value Date  ? CHOL 136 04/10/2021  ? ?Lab Results  ?Component Value Date  ? HDL 31 (L) 04/10/2021  ? ?Lab Results  ?Component Value Date  ? [place] 84 04/10/2021  ? ?Lab Results  ?Component Value Date  ? TRIG 111 04/10/2021  ? ?  Lab Results  ?Component Value Date  ? CHOLHDL 4.4 04/10/2021  ? ?Lab Results   ?Component Value Date  ? HGBA1C <4.0 09/07/2013  ? ? ?  ?Assessment & Plan:  ? ?Problem List Items Addressed This Visit   ? ?  ? Other  ? Hb-SS disease without crisis [facility]) - Primary ?Persistent however stable  ?Ensure adequate hydration. ?Move frequently to reduce venous thromboembolism risk. ?Avoid situations that could lead to dehydration or could exacerbate pain ?Discussed S&S of infection, seizures, stroke acute chest, DVT and how important it is to seek medical attention ?Take medication as directed along with pain contract and overall compliance ?Discussed the risk related to opiate use (addition, tolerance and dependency) ?  ? Relevant Medications  ? Oxycodone HCl 20 MG TABS  ? Other Relevant Orders  ? POCT URINALYSIS DIP (CLINITEK) (Completed)  ? Sickle Cell Panel  ? Chronic prescription opiate use  ? Relevant Orders  ? Sickle Cell Panel  ? Paranoid schizophrenia ([place]) ?Persistent  ?Will follow up with psychiatry   ? ?Other Visit Diagnoses   ? ? Chronic pain syndrome      ? Relevant Medications  ? Oxycodone HCl 20 MG TABS  ? Other Relevant Orders  ? Sickle Cell Panel  ? ?  ? ? ?Meds ordered this encounter  ?Medications  ? Oxycodone HCl 20 MG TABS  ?  Sig: Take 1 tablet (20 mg total) by mouth 4 (four) times daily as needed for up to 15 days.  ?  Dispense:  60 tablet  ?  Refill:  0  ?  Order Specific Question:   Supervising Provider  ?  AnswerTresa Garter [[id number]]  ? ? ?Follow-up: Return for Follow up SCD 25498.  ? ? ?Vevelyn Francois, NP ?

## 2021-09-09 LAB — CMP14+CBC/D/PLT+FER+RETIC+V...
ALT: 31 IU/L (ref 0–44)
AST: 45 IU/L — ABNORMAL HIGH (ref 0–40)
Albumin/Globulin Ratio: 1.6 (ref 1.2–2.2)
Albumin: 4.5 g/dL (ref 4.0–5.0)
Alkaline Phosphatase: 83 IU/L (ref 44–121)
BUN/Creatinine Ratio: 15 (ref 9–20)
BUN: 9 mg/dL (ref 6–20)
Basophils Absolute: 0.1 10*3/uL (ref 0.0–0.2)
Basos: 0 %
Bilirubin Total: 0.8 mg/dL (ref 0.0–1.2)
CO2: 21 mmol/L (ref 20–29)
Calcium: 9.4 mg/dL (ref 8.7–10.2)
Chloride: 104 mmol/L (ref 96–106)
Creatinine, Ser: 0.6 mg/dL — ABNORMAL LOW (ref 0.76–1.27)
EOS (ABSOLUTE): 0.6 10*3/uL — ABNORMAL HIGH (ref 0.0–0.4)
Eos: 4 %
Ferritin: 870 ng/mL — ABNORMAL HIGH (ref 30–400)
Globulin, Total: 2.9 g/dL (ref 1.5–4.5)
Glucose: 69 mg/dL — ABNORMAL LOW (ref 70–99)
Hematocrit: 28 % — ABNORMAL LOW (ref 37.5–51.0)
Hemoglobin: 9.6 g/dL — ABNORMAL LOW (ref 13.0–17.7)
Immature Grans (Abs): 0 10*3/uL (ref 0.0–0.1)
Immature Granulocytes: 0 %
Lymphocytes Absolute: 6.2 10*3/uL — ABNORMAL HIGH (ref 0.7–3.1)
Lymphs: 47 %
MCH: 34.3 pg — ABNORMAL HIGH (ref 26.6–33.0)
MCHC: 34.3 g/dL (ref 31.5–35.7)
MCV: 100 fL — ABNORMAL HIGH (ref 79–97)
Monocytes Absolute: 0.8 10*3/uL (ref 0.1–0.9)
Monocytes: 6 %
NRBC: 4 % — ABNORMAL HIGH (ref 0–0)
Neutrophils Absolute: 5.8 10*3/uL (ref 1.4–7.0)
Neutrophils: 43 %
Platelets: 745 10*3/uL — ABNORMAL HIGH (ref 150–450)
Potassium: 4.5 mmol/L (ref 3.5–5.2)
RBC: 2.8 x10E6/uL — ABNORMAL LOW (ref 4.14–5.80)
RDW: 16.2 % — ABNORMAL HIGH (ref 11.6–15.4)
Retic Ct Pct: 6.3 % — ABNORMAL HIGH (ref 0.6–2.6)
Sodium: 140 mmol/L (ref 134–144)
Total Protein: 7.4 g/dL (ref 6.0–8.5)
Vit D, 25-Hydroxy: 25.7 ng/mL — ABNORMAL LOW (ref 30.0–100.0)
WBC: 13.5 10*3/uL — ABNORMAL HIGH (ref 3.4–10.8)
eGFR: 129 mL/min/{1.73_m2} (ref >59)

## 2021-09-18 ENCOUNTER — Telehealth: Payer: Self-pay

## 2021-09-18 NOTE — Telephone Encounter (Signed)
Oxycodone  °

## 2021-09-22 ENCOUNTER — Telehealth: Payer: Self-pay

## 2021-09-22 NOTE — Telephone Encounter (Signed)
Oxycodone  °

## 2021-09-23 ENCOUNTER — Other Ambulatory Visit: Payer: Self-pay | Admitting: Internal Medicine

## 2021-09-23 DIAGNOSIS — D571 Sickle-cell disease without crisis: Secondary | ICD-10-CM

## 2021-09-23 DIAGNOSIS — G894 Chronic pain syndrome: Secondary | ICD-10-CM

## 2021-09-23 MED ORDER — OXYCODONE HCL 20 MG PO TABS: 1.0000 | ORAL_TABLET | Freq: Four times a day (QID) | ORAL | 0 refills | Status: DC | PRN

## 2021-10-03 ENCOUNTER — Telehealth: Payer: Self-pay

## 2021-10-03 NOTE — Telephone Encounter (Signed)
risperidone ?

## 2021-10-06 ENCOUNTER — Telehealth: Payer: Self-pay

## 2021-10-06 NOTE — Telephone Encounter (Signed)
Oxycodone 20 mg °

## 2021-10-07 ENCOUNTER — Other Ambulatory Visit: Payer: Self-pay | Admitting: Internal Medicine

## 2021-10-07 DIAGNOSIS — G894 Chronic pain syndrome: Secondary | ICD-10-CM

## 2021-10-07 DIAGNOSIS — D571 Sickle-cell disease without crisis: Secondary | ICD-10-CM

## 2021-10-07 MED ORDER — RISPERIDONE 1 MG PO TABS: 1.0000 mg | ORAL_TABLET | Freq: Two times a day (BID) | ORAL | 1 refills | Status: DC

## 2021-10-07 MED ORDER — OXYCODONE HCL 20 MG PO TABS: 1.0000 | ORAL_TABLET | Freq: Four times a day (QID) | ORAL | 0 refills | Status: DC | PRN

## 2021-10-20 ENCOUNTER — Telehealth: Payer: Self-pay

## 2021-10-20 NOTE — Telephone Encounter (Signed)
Oxycodone  °

## 2021-10-23 ENCOUNTER — Telehealth: Payer: Self-pay

## 2021-10-23 ENCOUNTER — Other Ambulatory Visit: Payer: Self-pay | Admitting: Internal Medicine

## 2021-10-23 DIAGNOSIS — G894 Chronic pain syndrome: Secondary | ICD-10-CM

## 2021-10-23 DIAGNOSIS — D571 Sickle-cell disease without crisis: Secondary | ICD-10-CM

## 2021-10-23 MED ORDER — OXYCODONE HCL 20 MG PO TABS: 1.0000 | ORAL_TABLET | Freq: Four times a day (QID) | ORAL | 0 refills | Status: AC | PRN

## 2021-10-23 NOTE — Telephone Encounter (Signed)
Oxycodone  °

## 2021-10-30 ENCOUNTER — Other Ambulatory Visit: Payer: Self-pay | Admitting: Internal Medicine

## 2021-10-30 NOTE — Telephone Encounter (Signed)
Will forward to provider  

## 2021-11-05 ENCOUNTER — Telehealth: Payer: Self-pay

## 2021-11-05 NOTE — Telephone Encounter (Signed)
Oxycodone  °

## 2021-11-07 ENCOUNTER — Telehealth: Payer: Self-pay | Admitting: Nurse Practitioner

## 2021-11-07 NOTE — Telephone Encounter (Signed)
Requesting oxycodone refill be sent to [facility] on [address] [address]

## 2021-11-10 ENCOUNTER — Other Ambulatory Visit: Payer: Self-pay | Admitting: Family Medicine

## 2021-11-10 ENCOUNTER — Telehealth: Payer: Self-pay

## 2021-11-10 ENCOUNTER — Other Ambulatory Visit: Payer: Self-pay | Admitting: Internal Medicine

## 2021-11-10 DIAGNOSIS — D571 Sickle-cell disease without crisis: Secondary | ICD-10-CM

## 2021-11-10 DIAGNOSIS — G894 Chronic pain syndrome: Secondary | ICD-10-CM

## 2021-11-10 MED ORDER — OXYCODONE HCL 20 MG PO TABS: 20.0000 mg | ORAL_TABLET | Freq: Four times a day (QID) | ORAL | 0 refills | Status: DC | PRN

## 2021-11-10 NOTE — Progress Notes (Signed)
Reviewed PDMP substance reporting system prior to prescribing opiate medications. No inconsistencies noted.   Meds ordered this encounter  Medications   Oxycodone HCl 20 MG TABS    Sig: Take 1 tablet (20 mg total) by mouth every 6 (six) hours as needed.    Dispense:  60 tablet    Refill:  0    Order Specific Question:   Supervising Provider    Answer:   JEGEDE, OLUGBEMIGA E [[id number]]      Alejandro Soto Aaran Enberg  APRN, MSN, FNP-C [facility] [address]  [place], [place] [address] [phone]  

## 2021-11-10 NOTE — Telephone Encounter (Signed)
Prior authorization started for Oxycodone HCL 20 mg tabs waiting on insurance approval confirmation#[id number]

## 2021-11-11 ENCOUNTER — Telehealth: Payer: Self-pay

## 2021-11-11 NOTE — Telephone Encounter (Signed)
Prior authorization for Oxycodone approved Confirmation #:[id number] WBenefit   Plan:MCAIDHealth Plan:[id number] Prior Approval #:[id number]   Type:PHARMACYRecipient:Alejandro COWANRecipient EK:[id number] NBilling   Provider:Billing Provider XT:[id number] Provider Name:[facility] [place] Garvin [place] Provider KP:[id number] Submission  Date:05/22/2023Status:APPROVEDEffective Begin Date:05/22/2023Effective End Date:08/20/2023Payer:[place] [employer] OF HEALTH BENEFITS# of Attachments:1

## 2021-11-24 ENCOUNTER — Telehealth: Payer: Self-pay

## 2021-11-24 NOTE — Telephone Encounter (Signed)
Oxycodone  °

## 2021-11-26 ENCOUNTER — Other Ambulatory Visit: Payer: Self-pay | Admitting: Family Medicine

## 2021-11-26 DIAGNOSIS — G894 Chronic pain syndrome: Secondary | ICD-10-CM

## 2021-11-26 DIAGNOSIS — D571 Sickle-cell disease without crisis: Secondary | ICD-10-CM

## 2021-11-26 MED ORDER — OXYCODONE HCL 20 MG PO TABS: 20.0000 mg | ORAL_TABLET | Freq: Four times a day (QID) | ORAL | 0 refills | Status: DC | PRN

## 2021-11-26 NOTE — Progress Notes (Signed)
Reviewed PDMP substance reporting system prior to prescribing opiate medications. No inconsistencies noted.   Meds ordered this encounter  Medications   Oxycodone HCl 20 MG TABS    Sig: Take 1 tablet (20 mg total) by mouth every 6 (six) hours as needed.    Dispense:  60 tablet    Refill:  0    Order Specific Question:   Supervising Provider    Answer:   JEGEDE, OLUGBEMIGA E [[id number]]      Demetria Iwai [facility]  APRN, MSN, FNP-C [facility] [address]  [place], [place] [address] [phone]  

## 2021-12-09 ENCOUNTER — Other Ambulatory Visit: Payer: Self-pay | Admitting: Family Medicine

## 2021-12-09 DIAGNOSIS — D571 Sickle-cell disease without crisis: Secondary | ICD-10-CM

## 2021-12-09 DIAGNOSIS — G894 Chronic pain syndrome: Secondary | ICD-10-CM

## 2021-12-09 MED ORDER — OXYCODONE HCL 20 MG PO TABS: 20.0000 mg | ORAL_TABLET | Freq: Four times a day (QID) | ORAL | 0 refills | Status: DC | PRN

## 2021-12-09 NOTE — Progress Notes (Signed)
Reviewed PDMP substance reporting system prior to prescribing opiate medications. No inconsistencies noted.   Meds ordered this encounter  Medications   Oxycodone HCl 20 MG TABS    Sig: Take 1 tablet (20 mg total) by mouth every 6 (six) hours as needed.    Dispense:  60 tablet    Refill:  0    Order Specific Question:   Supervising Provider    Answer:   JEGEDE, OLUGBEMIGA E [[id number]]      Alejandro Taussig Moore Maybree Riling  APRN, MSN, FNP-C [facility] [address]  [place], [place] [address] [phone]  

## 2021-12-10 ENCOUNTER — Ambulatory Visit: Payer: Medicaid Other | Admitting: Nurse Practitioner

## 2021-12-16 ENCOUNTER — Ambulatory Visit (INDEPENDENT_AMBULATORY_CARE_PROVIDER_SITE_OTHER): Payer: Medicare Other | Admitting: Nurse Practitioner

## 2021-12-16 ENCOUNTER — Encounter: Payer: Self-pay | Admitting: Nurse Practitioner

## 2021-12-16 VITALS — BP 117/66 | HR 68 | Temp 97.8°F | Ht 68.0 in | Wt 187.6 lb

## 2021-12-16 DIAGNOSIS — D5709 Hb-ss disease with crisis with other specified complication: Secondary | ICD-10-CM | POA: Diagnosis not present

## 2021-12-16 DIAGNOSIS — E559 Vitamin D deficiency, unspecified: Secondary | ICD-10-CM

## 2021-12-16 DIAGNOSIS — D571 Sickle-cell disease without crisis: Secondary | ICD-10-CM | POA: Diagnosis not present

## 2021-12-16 MED ORDER — HYDROXYUREA 500 MG PO CAPS: 1500.0000 mg | ORAL_CAPSULE | Freq: Every day | ORAL | 3 refills | Status: DC

## 2021-12-16 NOTE — Progress Notes (Signed)
[facility], [place]  [address] Phone:  [phone]   Fax:  [phone] Subjective:   Patient ID: Alejandro Soto, male    DOB: 1986-04-09, 36 y.o.   MRN: [id number]  Chief Complaint  Patient presents with   Follow-up   HPI Alejandro Soto 36 y.o. male has a past medical history of Insomnia (08/2019), Pneumonia, and Sickle cell anemia ([place]). To the [facility] for reevaluation of SCD.  States that since last visit, he has had no hospitalizations or crises related to sickle cell.  Has been taking supplements as prescribed and maintaining appropriate hydration and diet. Denies any other concerns today.   Denies any fatigue, chest pain, shortness of breath, HA or dizziness. Denies any blurred vision, numbness or tingling.  Past Medical History:  Diagnosis Date   Insomnia 08/2019   Pneumonia    Sickle cell anemia (HCC)     Past Surgical History:  Procedure Laterality Date   NO PAST SURGERIES      Family History  Problem Relation Age of Onset   Cancer Maternal Grandmother        colon    Diabetes Father    Cancer Father 25       Prostate   Asthma Brother     Social History   Socioeconomic History   Marital status: Single    Spouse name: Not on file   Number of children: Not on file   Years of education: Not on file   Highest education level: Not on file  Occupational History   Not on file  Tobacco Use   Smoking status: Some Days    Packs/day: 0.25    Years: 5.00    Total pack years: 1.25    Types: Cigarettes   Smokeless tobacco: Never  Vaping Use   Vaping Use: Never used  Substance and Sexual Activity   Alcohol use: No    Alcohol/week: 0.0 standard drinks of alcohol   Drug use: No   Sexual activity: Yes    Birth control/protection: Condom  Other Topics Concern   Not on file  Social History Narrative   Not on file   Social Determinants of Health   Financial Resource Strain: Not on file  Food Insecurity: Not on  file  Transportation Needs: Not on file  Physical Activity: Not on file  Stress: Not on file  Social Connections: Not on file  Intimate Partner Violence: Not on file    Outpatient Medications Prior to Visit  Medication Sig Dispense Refill   busPIRone (BUSPAR) 5 MG tablet TAKE 1 TABLET BY MOUTH TWICE A DAY 180 tablet 1   ibuprofen (ADVIL) 800 MG tablet Take 1 tablet (800 mg total) by mouth every 8 (eight) hours as needed for mild pain. 90 tablet 0   Multiple Vitamin (MULTIVITAMIN) tablet Take 2 tablets by mouth daily.      Oxycodone HCl 20 MG TABS Take 1 tablet (20 mg total) by mouth every 6 (six) hours as needed. 60 tablet 0   REXULTI 1 MG TABS      risperiDONE (RISPERDAL) 1 MG tablet TAKE 1 TABLET BY MOUTH TWICE A DAY 180 tablet 1   triamcinolone (KENALOG) 0.025 % ointment Apply 1 application topically 2 (two) times daily. 30 g 2   hydroxyurea (HYDREA) 500 MG capsule Take 3 capsules (1,500 mg total) by mouth daily. May take with food to minimize GI side effects. 270 capsule 3   gabapentin (NEURONTIN) 600  MG tablet Take 1 tablet (600 mg total) by mouth 2 (two) times daily. Take 1 capsule, by mouth, 2 times a day. 60 tablet 11   traZODone (DESYREL) 150 MG tablet Take 1 tablet (150 mg total) by mouth at bedtime. 30 tablet 11   No facility-administered medications prior to visit.    Allergies  Allergen Reactions   Amoxicillin Diarrhea    Did it involve swelling of the face/tongue/throat, SOB, or low BP? No Did it involve sudden or severe rash/hives, skin peeling, or any reaction on the inside of your mouth or nose? No Did you need to seek medical attention at a hospital or doctor's office? No When did it last happen?      childhood If all above answers are "NO", may proceed with cephalosporin use.     Review of Systems  Constitutional:  Negative for chills, fever and malaise/fatigue.  Respiratory:  Negative for cough and shortness of breath.   Cardiovascular:  Negative for chest  pain, palpitations and leg swelling.  Gastrointestinal:  Negative for abdominal pain, blood in stool, constipation, diarrhea, nausea and vomiting.  Musculoskeletal: Negative.   Skin: Negative.   Neurological: Negative.   Psychiatric/Behavioral:  Negative for depression. The patient is not nervous/anxious.   All other systems reviewed and are negative.      Objective:    Physical Exam Vitals reviewed.  Constitutional:      General: He is not in acute distress.    Appearance: Normal appearance. He is normal weight.  HENT:     Head: Normocephalic.  Neck:     Vascular: No carotid bruit.  Cardiovascular:     Rate and Rhythm: Normal rate and regular rhythm.     Pulses: Normal pulses.     Heart sounds: Normal heart sounds.     Comments: No obvious peripheral edema Pulmonary:     Effort: Pulmonary effort is normal.     Breath sounds: Normal breath sounds.  Musculoskeletal:        General: No swelling, tenderness, deformity or signs of injury. Normal range of motion.     Cervical back: Normal range of motion and neck supple. No rigidity or tenderness.     Right lower leg: No edema.     Left lower leg: No edema.  Lymphadenopathy:     Cervical: No cervical adenopathy.  Skin:    General: Skin is warm and dry.     Capillary Refill: Capillary refill takes less than 2 seconds.  Neurological:     General: No focal deficit present.     Mental Status: He is alert and oriented to person, place, and time.  Psychiatric:        Mood and Affect: Mood normal.        Behavior: Behavior normal.        Thought Content: Thought content normal.        Judgment: Judgment normal.     BP 117/66 (BP Location: Left Arm, Patient Position: Sitting, Cuff Size: Large)   Pulse 68   Temp 97.8 F (36.6 C) (Temporal)   Ht '5\' 8"'  (1.727 m)   Wt 187 lb 9.6 oz (85.1 kg)   SpO2 100%   BMI 28.52 kg/m  Wt Readings from Last 3 Encounters:  12/16/21 187 lb 9.6 oz (85.1 kg)  09/08/21 189 lb (85.7 kg)   07/11/21 182 lb 3.2 oz (82.6 kg)    Immunization History  Administered Date(s) Administered   Influenza Whole 03/21/2012   Influenza,inj,Quad  PF,6+ Mos 03/21/2013, 03/01/2014   Pneumococcal Conjugate-13 03/21/2012   Pneumococcal Polysaccharide-23 07/15/2014   Tdap 08/14/2016    Diabetic Foot Exam - Simple   No data filed     Lab Results  Component Value Date   TSH 1.070 07/11/2021   Lab Results  Component Value Date   WBC 13.1 (H) 12/16/2021   HGB 10.3 (L) 12/16/2021   HCT 29.0 (L) 12/16/2021   MCV 97 12/16/2021   PLT 393 12/16/2021   Lab Results  Component Value Date   NA 142 12/16/2021   K 3.7 12/16/2021   CO2 22 12/16/2021   GLUCOSE 76 12/16/2021   BUN 7 12/16/2021   CREATININE 0.66 (L) 12/16/2021   BILITOT 1.0 12/16/2021   ALKPHOS 62 12/16/2021   AST 39 12/16/2021   ALT 28 12/16/2021   PROT 7.6 12/16/2021   ALBUMIN 4.6 12/16/2021   CALCIUM 9.0 12/16/2021   ANIONGAP 10 07/31/2020   EGFR 125 12/16/2021   Lab Results  Component Value Date   CHOL 136 04/10/2021   Lab Results  Component Value Date   HDL 31 (L) 04/10/2021   Lab Results  Component Value Date   LDLCALC 84 04/10/2021   Lab Results  Component Value Date   TRIG 111 04/10/2021   Lab Results  Component Value Date   CHOLHDL 4.4 04/10/2021   Lab Results  Component Value Date   HGBA1C <4.0 09/07/2013       Assessment & Plan:   Problem List Items Addressed This Visit       Other   Sickle cell disease, type SS ([place]) - Primary   Relevant Medications   hydroxyurea (HYDREA) 500 MG capsule, refilled without change    Other Relevant Orders   CBC with Differential/Platelet (Completed)   Comprehensive metabolic panel (Completed)   Vitamin D, 25-hydroxy (Completed)   Folate (Completed)   CBC with Differential/Platelet (Completed)   Comprehensive metabolic panel (Completed)   Vitamin D, 25-hydroxy (Completed)   Folate (Completed) Encouraged to maintain established treatment plan     Vitamin D deficiency   Relevant Orders   Vitamin D, 25-hydroxy (Completed) Encouraged continued compliance with supplements   Follow up in 3 mths for reevaluation of SCD, sooner as needed     I am having Alejandro Soto maintain his multivitamin, Rexulti, ibuprofen, busPIRone, gabapentin, traZODone, triamcinolone, risperiDONE, Oxycodone HCl, and hydroxyurea.  Meds ordered this encounter  Medications   hydroxyurea (HYDREA) 500 MG capsule    Sig: Take 3 capsules (1,500 mg total) by mouth daily. May take with food to minimize GI side effects.    Dispense:  270 capsule    Refill:  3     Teena Dunk, NP

## 2021-12-17 LAB — COMPREHENSIVE METABOLIC PANEL
ALT: 28 IU/L (ref 0–44)
AST: 39 IU/L (ref 0–40)
Albumin/Globulin Ratio: 1.5 (ref 1.2–2.2)
Albumin: 4.6 g/dL (ref 4.0–5.0)
Alkaline Phosphatase: 62 IU/L (ref 44–121)
BUN/Creatinine Ratio: 11 (ref 9–20)
BUN: 7 mg/dL (ref 6–20)
Bilirubin Total: 1 mg/dL (ref 0.0–1.2)
CO2: 22 mmol/L (ref 20–29)
Calcium: 9 mg/dL (ref 8.7–10.2)
Chloride: 102 mmol/L (ref 96–106)
Creatinine, Ser: 0.66 mg/dL — ABNORMAL LOW (ref 0.76–1.27)
Globulin, Total: 3 g/dL (ref 1.5–4.5)
Glucose: 76 mg/dL (ref 70–99)
Potassium: 3.7 mmol/L (ref 3.5–5.2)
Sodium: 142 mmol/L (ref 134–144)
Total Protein: 7.6 g/dL (ref 6.0–8.5)
eGFR: 125 mL/min/{1.73_m2} (ref >59)

## 2021-12-17 LAB — CBC WITH DIFFERENTIAL/PLATELET
Basophils Absolute: 0.1 10*3/uL (ref 0.0–0.2)
Basos: 1 %
EOS (ABSOLUTE): 0.5 10*3/uL — ABNORMAL HIGH (ref 0.0–0.4)
Eos: 4 %
Hematocrit: 29 % — ABNORMAL LOW (ref 37.5–51.0)
Hemoglobin: 10.3 g/dL — ABNORMAL LOW (ref 13.0–17.7)
Immature Grans (Abs): 0 10*3/uL (ref 0.0–0.1)
Immature Granulocytes: 0 %
Lymphocytes Absolute: 4.4 10*3/uL — ABNORMAL HIGH (ref 0.7–3.1)
Lymphs: 33 %
MCH: 34.4 pg — ABNORMAL HIGH (ref 26.6–33.0)
MCHC: 35.5 g/dL (ref 31.5–35.7)
MCV: 97 fL (ref 79–97)
Monocytes Absolute: 0.9 10*3/uL (ref 0.1–0.9)
Monocytes: 7 %
NRBC: 5 % — ABNORMAL HIGH (ref 0–0)
Neutrophils Absolute: 7.2 10*3/uL — ABNORMAL HIGH (ref 1.4–7.0)
Neutrophils: 55 %
Platelets: 393 10*3/uL (ref 150–450)
RBC: 2.99 x10E6/uL — ABNORMAL LOW (ref 4.14–5.80)
RDW: 15.6 % — ABNORMAL HIGH (ref 11.6–15.4)
WBC: 13.1 10*3/uL — ABNORMAL HIGH (ref 3.4–10.8)

## 2021-12-17 LAB — VITAMIN D 25 HYDROXY (VIT D DEFICIENCY, FRACTURES): Vit D, 25-Hydroxy: 23.6 ng/mL — ABNORMAL LOW (ref 30.0–100.0)

## 2021-12-17 LAB — FOLATE: Folate: 10 ng/mL (ref >3.0)

## 2021-12-19 ENCOUNTER — Other Ambulatory Visit: Payer: Self-pay | Admitting: Nurse Practitioner

## 2021-12-19 DIAGNOSIS — E559 Vitamin D deficiency, unspecified: Secondary | ICD-10-CM

## 2021-12-19 MED ORDER — VITAMIN D (ERGOCALCIFEROL) 1.25 MG (50000 UNIT) PO CAPS: 50000.0000 [IU] | ORAL_CAPSULE | ORAL | 6 refills | Status: AC

## 2021-12-24 ENCOUNTER — Other Ambulatory Visit: Payer: Self-pay | Admitting: Nurse Practitioner

## 2021-12-24 DIAGNOSIS — D571 Sickle-cell disease without crisis: Secondary | ICD-10-CM

## 2021-12-24 DIAGNOSIS — G894 Chronic pain syndrome: Secondary | ICD-10-CM

## 2021-12-24 MED ORDER — OXYCODONE HCL 20 MG PO TABS: 20.0000 mg | ORAL_TABLET | Freq: Four times a day (QID) | ORAL | 0 refills | Status: DC | PRN

## 2022-01-08 ENCOUNTER — Other Ambulatory Visit: Payer: Self-pay | Admitting: Family Medicine

## 2022-01-08 ENCOUNTER — Telehealth: Payer: Self-pay

## 2022-01-08 DIAGNOSIS — G894 Chronic pain syndrome: Secondary | ICD-10-CM

## 2022-01-08 DIAGNOSIS — D571 Sickle-cell disease without crisis: Secondary | ICD-10-CM

## 2022-01-08 MED ORDER — OXYCODONE HCL 20 MG PO TABS: 20.0000 mg | ORAL_TABLET | Freq: Four times a day (QID) | ORAL | 0 refills | Status: DC | PRN

## 2022-01-08 NOTE — Progress Notes (Signed)
Reviewed PDMP substance reporting system prior to prescribing opiate medications. No inconsistencies noted.   Meds ordered this encounter  Medications   Oxycodone HCl 20 MG TABS    Sig: Take 1 tablet (20 mg total) by mouth every 6 (six) hours as needed.    Dispense:  60 tablet    Refill:  0    Order Specific Question:   Supervising Provider    Answer:   JEGEDE, OLUGBEMIGA E [[id number]]      Alejandro Roedl Moore Oshae Simmering  APRN, MSN, FNP-C [facility] [address]  [place], [place] [address] [phone]  

## 2022-01-08 NOTE — Telephone Encounter (Signed)
Oxycodone  °

## 2022-01-13 ENCOUNTER — Ambulatory Visit (HOSPITAL_COMMUNITY)
Admission: EM | Admit: 2022-01-13 | Discharge: 2022-01-13 | Disposition: A | Discharge status: Home / Self Care | Payer: Medicare Other | Attending: Psychiatry | Admitting: Psychiatry

## 2022-01-13 DIAGNOSIS — Z79899 Other long term (current) drug therapy: Secondary | ICD-10-CM | POA: Insufficient documentation

## 2022-01-13 DIAGNOSIS — R52 Pain, unspecified: Secondary | ICD-10-CM | POA: Insufficient documentation

## 2022-01-13 DIAGNOSIS — D571 Sickle-cell disease without crisis: Secondary | ICD-10-CM | POA: Insufficient documentation

## 2022-01-13 DIAGNOSIS — F1910 Other psychoactive substance abuse, uncomplicated: Secondary | ICD-10-CM | POA: Diagnosis present

## 2022-01-13 NOTE — BH Assessment (Addendum)
Comprehensive Clinical Assessment (CCA) Screening, Triage and Referral Note  01/13/2022 Onur Mori [id number]  Disposition: Screening/Triage Completed. Patient is Emergent due to need for medical clearance. EMS contacted by the provider and report called to [facility] Emergency Department. Patient awaiting EMS transfer to [facility] Emergency Department   Chief Complaint:  Chief Complaint  Patient presents with   Delusional   Visit Diagnosis: Screening/Triage Completed. Patient is Emergent due to need for medical clearance. Transferred to the Emergency Department ([facility]).  Norrin Shreffler is a 36 y/o male that presents to the [facility], voluntarily. Upon observation patient is lethargic. Speech is slurred. He is slumped over and has difficulty making eye contact. He was asked to provide two identifiers (name and DOB). He was able to verbalize his name and DOB, however; speech was slurred, and he was difficult to understand. Patient with symptoms of altered mental health status. He currently believes that he is in [facility], then states that he is in [place]. He is unsure of the date, time, month, and year. He provides an unrecognizable response when asked the name of the current president. He was not able to provide an appropriate response when asked what brought him here today. The [facility] provider Geni Bers, Truman Hayward) NP was immediately notified of patient's presentation and conducted an evaluation to determine that patient will need medical clearance. Unable to determine if patient has SI, HI, and/or AVH's.   Patient is a poor historian; therefore, his mother ([place], Lattie Haw) provides collateral. She states that patient's above presentation started Sunday 01/11/2022. She noticed that patient's pupils were dilated and that he was not able to carry on a conversation w/o falling asleep. His mother believes that patient consumed Hydrocodone, which is prescribed for pain management for his sickle cell anemia.  States that he doesn't take the medication as he should and often over takes this medication. She is unaware of how much he may have taken but suspects this is the reason why he is presenting this way. She was asked if patient takes any other medications and/or illicit substances. She states that he may use THC. However, has no details of his usage.   According to patient's mother he is also dx's with Schizophrenia and says that he refuses to take any psychotorophics medications. His mother reports that patient was hospitalized for psychiatric reasons at [facility]. She explains that his psychiatric admission was triggered after finding out he was denied disability benefits. His mother also mentions that he has a therapist. However, the name of the therapist was not shared.   Patient Reported Information How did you hear about [place]? Family/Friend  What Is the Reason for Your Visit/Call Today? Unable to determine due to altered mental health status.  How Long Has This Been Causing You Problems? <Week  What Do You Feel Would Help You the Most Today? Treatment for Depression or other mood problem; Medication(s); Stress Management; Social Support   Have You Recently Had Any Thoughts About Hurting Yourself? -- (Unable to determine due to altered mental health status.)  Are You Planning to Commit Suicide/Harm Yourself At This time? -- (Unable to determine due to altered mental health status.)   Have you Recently Had Thoughts About Hurting Someone [place]? -- (Unable to determine due to altered mental health status.)  Are You Planning to Harm Someone at This Time? -- (Unable to determine due to altered mental health status.)  Explanation: No data recorded  Have You Used Any Alcohol or Drugs in the  Past 24 Hours? -- (Unable to determine due to altered mental health status.)  How Long Ago Did You Use Drugs or Alcohol? No data recorded What Did You Use and How Much? No data recorded  Do You  Currently Have a Therapist/Psychiatrist? No data recorded Name of Therapist/Psychiatrist: No data recorded  Have You Been Recently Discharged From Any Office Practice or Programs? No data recorded Explanation of Discharge From Practice/Program: No data recorded     Options For Referral:  Medication Management; Outpatient Therapy; Facility-Based Crisis; Inpatient Hospitalization; Other: Comment (ACTT services)   Discharge Disposition: Medical Clearance;    Waldon Merl, Counselor

## 2022-01-13 NOTE — Discharge Instructions (Addendum)
Follow up with outpatient services at [facility]

## 2022-01-13 NOTE — ED Provider Notes (Signed)
Behavioral Health Urgent Care Medical Screening Exam  Patient Name: Alejandro Soto MRN: [id number] Date of Evaluation: 01/13/22 Chief Complaint:   Diagnosis:  Final diagnoses:  Substance abuse ([place])   History of Present illness: Alejandro Soto is a 36 y.o. male. Pt presents voluntarily to [facility] behavioral health for walk-in assessment.  Pt is accompanied by Lattie Haw, his mother. Pt is assessed face-to-face by nurse practitioner.   Pt initially presented disoriented w/ altered mental status. EMS was activated. Upon EMS arrival, pt refused to go to the ED. Pt states writer should not have taken his responses seriously since he was "half-asleep". EMS left due to pt refusal.   Pt assessed face to face. Pt is now a&ox3, seen ambulating upon my approach, looking at the vending machine. Pt agrees to assessment.   Pt reports he was brought to this facility by his mother, who drove. States he does not know why he was brought here since he does not feel he needs an evaluation. Pt states all he was doing was sleeping yesterday and today. Pt expresses frustration, feels that his mother is using him for his disability payments. States yesterday, his mother was asking him to pay $2400 for housing, car, and clothes payments. Pt states his mother initially did not want him living with her until he received disability. Pt is vague on his substance use, states he was "tipsy" on alcohol yesterday, although when asked about volume of use, then denies drinking alcohol. Pt also reports using his rx'd oxycodone yesterday, although when asked about volume of use, states he has not taken them because "I can't even find them".   Pt was asked directly if he was having thoughts about harming his mother. Pt denies he wants to harm her, states he'd rather "use my brain", "intellect" to counter her. When asked what he means, he states telling the truth about her using him for his disability payments.   Pt denies SI/VI/HI,  AVH, paranoia.  Pt denies family psychiatric hx.   Pt denies access to a firearm.   Discussed w/ pt I am still recommending he be transferred to the ED for medical evaluation. Pt agrees to go to the ED. EMS was activated. Upon EMS arrival, pt again refused to go to the ED. Pt ambulated out of facility.  Pt is a&ox3, in no acute distress, non-toxic appearing. Vital signs reviewed and are within normal limits. He is denying SI/VI/HI, AVH, paranoia. Objectively, there are no signs of mania or psychosis. Pt does not meet criteria for IVC.    Psychiatric Specialty Exam  Presentation  General Appearance:Appropriate for Environment; Casual; Fairly Groomed  Eye Contact:Good  Speech:Clear and Coherent; Normal Rate; Slurred  Speech Volume:Increased  Handedness:No data recorded  Mood and Affect  Mood:-- (frustrated)  Affect:Congruent   Thought Process  Thought Processes:Coherent; Goal Directed; Linear  Descriptions of Associations:Intact  Orientation:Full (Time, Place and Person)  Thought Content:Logical    Hallucinations:None  Ideas of Reference:None  Suicidal Thoughts:No  Homicidal Thoughts:No   Sensorium  Memory:Immediate Fair  Judgment:Poor  Insight:Shallow   Executive Functions  Concentration:Fair  Attention Span:Fair  [name]   Psychomotor Activity  Psychomotor Activity:Normal   Assets  Assets:Communication Skills; Financial Resources/Insurance; Housing; Social Support   Sleep  Sleep:Fair  Number of hours: No data recorded  No data recorded  Physical Exam: Physical Exam Cardiovascular:     Rate and Rhythm: Normal rate.  Pulmonary:     Effort: Pulmonary effort  is normal.  Neurological:     Mental Status: He is alert and oriented to person, place, and time.  Psychiatric:        Speech: Speech is slurred.        Behavior: Behavior is agitated. Behavior is cooperative.        Thought Content:  Thought content normal.    Review of Systems  Constitutional:  Negative for chills and fever.  Respiratory:  Negative for shortness of breath.   Cardiovascular:  Negative for chest pain and palpitations.  Gastrointestinal:  Negative for abdominal pain.  Psychiatric/Behavioral:  Positive for substance abuse. Negative for suicidal ideas.    Blood pressure 107/64, pulse 99, temperature 98.4 F (36.9 C), temperature source Oral, resp. rate 18, height '5\' 8"'$  (1.727 m), weight 195 lb (88.5 kg), SpO2 99 %. Body mass index is 29.65 kg/m.  Musculoskeletal: Strength & Muscle Tone: within normal limits Gait & Station: normal Patient leans: N/A  [facility] MSE Discharge Disposition for Follow up and Recommendations: Based on my evaluation the patient does not appear to have an emergency medical condition and can be discharged with resources and follow up care in outpatient services for outpatient resources  Tharon Aquas, NP 01/13/2022, 1:30 PM

## 2022-01-13 NOTE — BH Assessment (Addendum)
Comprehensive Clinical Assessment (CCA) Note  01/13/2022 Alejandro Soto [id number]  Disposition: Received an update that patient refused to go with EMS as recommended by the [facility] provider Elvin So, NP). Patient reportedly is more alert, walking around, etc. Clinician and [facility] provider met with patient. CCA completed. Following the completion of the CCA it was recommended that patient seek medical clearance at the Emergency Department. He agreed, however; when EMS returned he left the premises refusing to go to the Emergency Department, again.   Received notice that patient's mother is taking out IVC papers on patient.   Chief Complaint:  Chief Complaint  Patient presents with   Delusional   Psychiatric Evaluation   Visit Diagnosis: Schizophrenia.    CCA Screening, Triage and Referral (STR)  Patient Reported Information How did you hear about [place]? Family/Friend  What Is the Reason for Your Visit/Call Today?   Per Triage/Screening Note:  Alejandro Soto is a 36 y/o male that presents to the [facility], voluntarily. Upon observation patient is lethargic. Speech is slurred. He is slumped over and has difficulty making eye contact. He was asked to provide two identifiers (name and DOB). He was able to verbalize his name and DOB, however; speech was slurred, and he was difficult to understand. Patient with symptoms of altered mental health status. He currently believes that he is in [facility], then states that he is in [place]. He is unsure of the date, time, month, and year. He provides an unrecognizable response when asked the name of the current president. He was not able to provide an appropriate response when asked what brought him here today. The [facility] provider Geni Bers, Truman Hayward) NP was immediately notified of patient's presentation and conducted an evaluation to determine that patient will need medical clearance. Unable to determine if patient has SI, HI, and/or AVH's.     Patient is a poor  historian; therefore, his mother (Corpening, Lattie Haw) provides collateral. She states that patient's above presentation started Sunday 01/11/2022. She noticed that patient's pupils were dilated and that he was not able to carry on a conversation w/o falling asleep. His mother believes that patient consumed Hydrocodone, which is prescribed for pain management for his sickle cell anemia. States that he doesn't take the medication as he should and often over takes this medication. She is unaware of how much he may have taken but suspects this is the reason why he is presenting this way. She was asked if patient takes any other medications and/or illicit substances. She states that he may use THC. However, has no details of his usage.    According to patient's mother he is also dx's with Schizophrenia and says that he refuses to take any psychotorophics medications. His mother reports that patient was hospitalized for psychiatric reasons at [facility]. She explains that his psychiatric admission was triggered after finding out he was denied disability benefits. His mother also mentions that he has a therapist. However, the name of the therapist was not shared.   CCA Note:  Alejandro Soto is a 36 y/o male that presents to the [facility], voluntarily. He is now alert and walking around, willing to complete a CCA. However, patient is angry, irritable, and speech is pressured. Patient oriented to x4. He reports that his mother has been taking his money and paying her bills. He reportedly received a large amount of money from a settlement and says that his mother has taken over $2400 away from him.   He denies SI, HI, and  AVH's. Denies history of self injurious behaviors. No access to weapons (guns). No family history of mental health issues. He initially says that drinks alcohol and yesterday he drank enough to get tipsy. Patient later recants stating that he doesn't drink alcohol at all and seems to have no  recollection of mentioning that he had alcohol yesterday. Also, mentions that he uses Hydrocodone for pain and last used it yesterday. He recant's this statement as well stating he hasn't used and doesn't know where his medication is at this time.   States that he has received treatment at [facility] in the past because his mother committed him and patient is seemingly very angry about this situation. Patient has a outpatient therapist. However, that provider is unknown.   During today's TTS assessment, patient is angry/irritable. He appears confused at times. His responses are questionable and doesn't seem accurate at times. He is agreeable with going to the Emergency Department for medical clearance.    How Long Has This Been Causing You Problems? <Week  What Do You Feel Would Help You the Most Today? Stress Management   Have You Recently Had Any Thoughts About Hurting Yourself? No  Are You Planning to Commit Suicide/Harm Yourself At This time? No   Have you Recently Had Thoughts About [place]? No  Are You Planning to Harm Someone at This Time? No  Explanation: No data recorded  Have You Used Any Alcohol or Drugs in the Past 24 Hours? Yes  How Long Ago Did You Use Drugs or Alcohol? No data recorded What Did You Use and How Much? Patient states that he had alcohol to drink.   Do You Currently Have a Therapist/Psychiatrist? Yes  Name of Therapist/Psychiatrist: His mother states that he has a therapist. The name of the provider is unknown at this time.   Have You Been Recently Discharged From Any Office Practice or Programs? No  Explanation of Discharge From Practice/Program: No data recorded    CCA Screening Triage Referral Assessment Type of Contact: Face-to-Face  Telemedicine Service Delivery: Telemedicine service delivery: This service was provided via telemedicine using a 2-way, interactive audio and video technology  Is this Initial or Reassessment? Initial  Assessment  Date Telepsych consult ordered in [facility]:  01/13/22  Time Telepsych consult ordered in [facility]:  No data recorded Location of Assessment: [facility] Assessment Services  Provider Location: [facility] Assessment Services   Collateral Involvement: Patient is a poor historian; therefore, his mother (Corpening, Lattie Haw) provides collateral. She states that patient's above presentation started Sunday 01/11/2022. She noticed that patient's pupils were dilated and that he was not able to carry on a conversation w/o falling asleep. His mother believes that patient consumed Hydrocodone, which is prescribed for pain management for his sickle cell anemia. States that he doesn't take the medication as he should and often over takes this medication. She is unaware of how much he may have taken but suspects this is the reason why he is presenting this way. She was asked if patient takes any other medications and/or illicit substances. She states that he may use THC. However, has no details of his usage.      According to patient's mother he is also dx's with Schizophrenia and says that he refuses to take any psychotorophics medications. His mother reports that patient was hospitalized for psychiatric reasons at [facility]. She explains that his psychiatric admission was triggered after finding out he was denied disability benefits. His mother also mentions  that he has a therapist. However, the name of the therapist was not shared.   Does Patient Have a Stage manager Guardian? No data recorded Name and Contact of Legal Guardian: No data recorded If Minor and Not Living with Parent(s), Who has Custody? No data recorded Is CPS involved or ever been involved? Never  Is APS involved or ever been involved? Never   Patient Determined To Be At Risk for Harm To Self or Others Based on Review of Patient Reported Information or Presenting Complaint? No  Method: No data recorded Availability of Means: No  data recorded Intent: No data recorded Notification Required: No data recorded Additional Information for Danger to Others Potential: No data recorded Additional Comments for Danger to Others Potential: No data recorded Are There Guns or Other Weapons in Your Home? No data recorded Types of Guns/Weapons: No data recorded Are These Weapons Safely Secured?                            No data recorded Who Could Verify You Are Able To Have These Secured: No data recorded Do You Have any Outstanding Charges, Pending Court Dates, Parole/Probation? No data recorded Contacted To Inform of Risk of Harm To Self or Others: No data recorded   Does Patient Present under Involuntary Commitment? No  IVC Papers Initial File Date: No data recorded  [place] of Residence: [place]   Patient Currently Receiving the Following Services: Individual Therapy   Determination of Need: Emergent (2 hours)   Options For Referral: Inpatient Hospitalization; Medication Management; Outpatient Therapy     CCA Biopsychosocial Patient Reported Schizophrenia/Schizoaffective Diagnosis in Past: No   Strengths: No data recorded  Mental Health Symptoms Depression:   None   Duration of Depressive symptoms:    Mania:   Increased Energy; Irritability; Change in energy/activity   Anxiety:    Restlessness; Tension; Irritability; Difficulty concentrating   Psychosis:   Grossly disorganized or catatonic behavior; Grossly disorganized speech; Other negative symptoms; Affective flattening/alogia/avolition   Duration of Psychotic symptoms:  Duration of Psychotic Symptoms: Greater than six months   Trauma:   N/A   Obsessions:   Intrusive/time consuming   Compulsions:   N/A   Inattention:   N/A   Hyperactivity/Impulsivity:  No data recorded  Oppositional/Defiant Behaviors:   N/A   Emotional Irregularity:   N/A   Other Mood/Personality Symptoms:   Currently calm and cooperative.    Mental Status  Exam Appearance and self-care  Stature:   Average   Weight:   Average weight   Clothing:   Neat/clean   Grooming:   Normal   Cosmetic use:   Age appropriate   Posture/gait:   Normal   Motor activity:   Not Remarkable   Sensorium  Attention:   Normal   Concentration:   Anxiety interferes; Preoccupied; Scattered   Orientation:   Time; Situation; Place; Person; Object   Recall/memory:   Defective in Short-term; Defective in Remote; Defective in Recent   Affect and Mood  Affect:   Labile   Mood:   Depressed; Angry; Irritable   Relating  Eye contact:   Fleeting   Facial expression:   Constricted; Tense   Attitude toward examiner:   Defensive; Resistant   Thought and Language  Speech flow:  Other (Comment) (Pressured)   Thought content:   Appropriate to Mood and Circumstances   Preoccupation:   None   Hallucinations:   None  Organization:  No data recorded  [employer] of Knowledge:   Average   Intelligence:   Average   Abstraction:   Overly abstract   Judgement:   Impaired; Poor   Reality Testing:   Distorted   Insight:   Gaps; Lacking; Poor   Decision Making:   Confused   Social Functioning  Social Maturity:   Impulsive   Social Judgement:   Heedless   Stress  Stressors:   Family conflict; Financial   Coping Ability:   Overwhelmed   Skill Deficits:   Decision making; Interpersonal; Self-control   Supports:   Family; Support needed     Religion: Religion/Spirituality Are You A Religious Person?:  (unknown) How Might This Affect Treatment?: unknown  Leisure/Recreation:    Exercise/Diet: Exercise/Diet Do You Exercise?:  (unknown) Have You Gained or Lost A Significant Amount of Weight in the Past Six Months?:  (unknown) Do You Follow a Special Diet?:  (unkown) Do You Have Any Trouble Sleeping?:  (unknown)   CCA Employment/Education Employment/Work Situation: Employment / Work  Situation Employment Situation: Unemployed Patient's Job has Been Impacted by Current Illness: No Has Patient ever Been in Passenger transport manager?: No  Education: Education Is Patient Currently Attending School?: No Last Grade Completed:  (unknown) Did You Nutritional therapist?: No Did You Have An Individualized Education Program (IIEP): No Did You Have Any Difficulty At School?: No Patient's Education Has Been Impacted by Current Illness: No   CCA Family/Childhood History Family and Relationship History: Family history Marital status: Single (unknown) Does patient have children?: No (unknown)  Childhood History:  Childhood History By whom was/is the patient raised?: Mother Did patient suffer any verbal/emotional/physical/sexual abuse as a child?: No (unknown) Did patient suffer from severe childhood neglect?: No (unknown) Has patient ever been sexually abused/assaulted/raped as an adolescent or adult?: No Was the patient ever a victim of a crime or a disaster?: No Witnessed domestic violence?: No Has patient been affected by domestic violence as an adult?: No  Child/Adolescent Assessment:     CCA Substance Use Alcohol/Drug Use: Alcohol / Drug Use Pain Medications: SEE MAR Prescriptions: SEE MAR Over the Counter: SEE MAR History of alcohol / drug use?: Yes Longest period of sobriety (when/how long): unknown Negative Consequences of Use: Personal relationships Withdrawal Symptoms: Agitation Substance #1 Name of Substance 1: Alcohol 1 - Age of First Use: Unable to determine. 1 - Amount (size/oz): Unable to determine. 1 - Frequency: Unable to determine. 1 - Duration: Unable to determine. 1 - Last Use / Amount: Unable to determine. 1 - Method of Aquiring: Unable to determine. 1- Route of Use: Unable to determine. Substance #2 Name of Substance 2: THC 2 - Age of First Use: Unable to determine. 2 - Amount (size/oz): Unable to determine. 2 - Frequency: Unable to determine. 2 -  Duration: Unable to determine. 2 - Last Use / Amount: Unable to determine. 2 - Method of Aquiring: Unable to determine. 2 - Route of Substance Use: Unable to determine.                     ASAM's:  Six Dimensions of Multidimensional Assessment  Dimension 1:  Acute Intoxication and/or Withdrawal Potential:      Dimension 2:  Biomedical Conditions and Complications:      Dimension 3:  Emotional, Behavioral, or Cognitive Conditions and Complications:     Dimension 4:  Readiness to Change:     Dimension 5:  Relapse, Continued use, or Continued  Problem Potential:     Dimension 6:  Recovery/Living Environment:     ASAM Severity Score:    ASAM Recommended Level of Treatment:     Substance use Disorder (SUD)    Recommendations for Services/Supports/Treatments: Recommendations for Services/Supports/Treatments Recommendations For Services/Supports/Treatments: Medication Management, ACCTT (Assertive Community Treatment), Peer Support, SAIOP (Substance Abuse Intensive Outpatient Program), Inpatient Hospitalization, CD-IOP Intensive Chemical Dependency Program  Discharge Disposition:    DSM5 Diagnoses: Patient Active Problem List   Diagnosis Date Noted   Opioid dependence, uncomplicated ([place]) 39/79/5369   Insomnia 03/23/2019   Chronic prescription opiate use 03/23/2019   Methadone use 08/12/2015   Chronic back pain greater than 3 months duration 04/09/2015   Abdominal pain, epigastric 12/24/2014   Sickle cell pain crisis ([place]) 12/16/2014   Thrombocytosis ([place]) 07/14/2014   Anemia 07/14/2014   Sore throat 07/03/2014   Sebaceous cyst 07/03/2014   Atopic dermatitis 06/12/2014   Leukocytosis 05/02/2014   Tobacco dependence 04/25/2014   Neck pain, bilateral 03/01/2014   Hb-SS disease without crisis ([place]) 02/22/2014   DJD (degenerative joint disease), lumbosacral 09/18/2013   Neuropathic pain 09/18/2013   Back pain, acute 08/11/2013   Opiate use 05/24/2013   Back pain  11/01/2012   Pain in joint, shoulder region 11/01/2012   Pain in joint, lower leg 11/01/2012   Vitamin D deficiency 04/21/2012   Encounter for therapeutic drug monitoring 04/21/2012   Paranoid schizophrenia ([place]) 04/21/2012   Priapism 04/21/2012   Smoking 04/21/2012   Intermittent pain 01/20/2012   Sickle cell disease, type SS ([place]) 01/20/2012     Referrals to Alternative Service(s): Referred to Alternative Service(s):   Place:   Date:   Time:    Referred to Alternative Service(s):   Place:   Date:   Time:    Referred to Alternative Service(s):   Place:   Date:   Time:    Referred to Alternative Service(s):   Place:   Date:   Time:     Waldon Merl, Counselor

## 2022-01-13 NOTE — ED Provider Notes (Signed)
Alejandro Soto is a 36 y/o male w/ hx of sickle cell anemia presenting to [facility] as a walk in with his mother, Lattie Haw.  Pt states he is 36 y/o (pt is 36 y/o), states he is in [place] (current location is [place]), and that he does not know who the president or what year it is. Pt is falling asleep during the assessment.  Per Lattie Haw, pt w/ hx of sickle cell anemia, has been in sickle cell crisis before. States pt is rx'd oxycodone for pain r/t sickle cell anemia. She believes pt has been abusing oxycodone. States she noticed change in pt's bhx since Sunday, noticed "dilated pupils". States pt has been disoriented, AMS since last night, although unsure of the time. States she believes pt has been dx'd w/ schizophrenia, not currently rx'd medications. States pt has had prior inpatient psychiatric hospitalizations, last in May 2023 at [facility].   EMS activated. Pt to be brought to [facility]. Recommend TTS consult.

## 2022-01-13 NOTE — Progress Notes (Addendum)
911 called due to suspected drug overdose for patient at the direction of NP.

## 2022-01-13 NOTE — BH Assessment (Signed)
Received an update that patient refused to go with EMS. Patient reportedly is more alert, walking around, etc. Clinician and [place] provider Geni Bers, NP) met with patient. CCA completed.

## 2022-01-13 NOTE — Progress Notes (Signed)
Patient refused care from EMS.

## 2022-01-22 ENCOUNTER — Other Ambulatory Visit: Payer: Self-pay | Admitting: Family Medicine

## 2022-01-22 DIAGNOSIS — G894 Chronic pain syndrome: Secondary | ICD-10-CM

## 2022-01-22 DIAGNOSIS — D571 Sickle-cell disease without crisis: Secondary | ICD-10-CM

## 2022-01-22 MED ORDER — OXYCODONE HCL 20 MG PO TABS: 20.0000 mg | ORAL_TABLET | Freq: Four times a day (QID) | ORAL | 0 refills | Status: DC | PRN

## 2022-01-22 NOTE — Progress Notes (Signed)
Reviewed PDMP substance reporting system prior to prescribing opiate medications. No inconsistencies noted.   Meds ordered this encounter  Medications   Oxycodone HCl 20 MG TABS    Sig: Take 1 tablet (20 mg total) by mouth every 6 (six) hours as needed.    Dispense:  60 tablet    Refill:  0    Order Specific Question:   Supervising Provider    Answer:   JEGEDE, OLUGBEMIGA E [[id number]]      Avilyn Virtue Moore Armas Mcbee  APRN, MSN, FNP-C [facility] [address]  [place], [place] [address] [phone]  

## 2022-02-04 ENCOUNTER — Telehealth: Payer: Self-pay | Admitting: Family Medicine

## 2022-02-04 NOTE — Telephone Encounter (Signed)
Alejandro Soto from [facility] LVM on nurse line stating that patient is there and has been treated for substance abuse. Requesting a return call from prescriber of narcotics. [phone]

## 2022-02-05 ENCOUNTER — Encounter: Payer: Self-pay | Admitting: Family Medicine

## 2022-02-05 NOTE — Progress Notes (Signed)
Ardon Cowen-year-old male with a medical history significant for sickle cell disease type SS, confirmed via hemoglobin fractionation.  Patient was recently hospitalized for stimulant induced psychosis.  Patient's urine was found to be positive for benzodiazepines and cocaine.  No opiates were yielded from drug screen..  Patient requested oxycodone on 02/04/2022.  No prescriptions will be sent from this clinic at this time.  Patient will warrant a first available appointment with this clinic for medication management.  And sickle cell disease. Attempted to contact treatment team at [facility], no return call.  Unable to leave message due to voicemail being full.  Donia Pounds  APRN, MSN, FNP-C Patient [facility] [address] [place], [place] [address] [phone]

## 2022-03-03 ENCOUNTER — Ambulatory Visit (INDEPENDENT_AMBULATORY_CARE_PROVIDER_SITE_OTHER): Payer: Medicare Other | Admitting: Family Medicine

## 2022-03-03 ENCOUNTER — Encounter: Payer: Self-pay | Admitting: Family Medicine

## 2022-03-03 ENCOUNTER — Telehealth: Payer: Self-pay

## 2022-03-03 VITALS — BP 108/64 | HR 73 | Ht 68.0 in | Wt 179.9 lb

## 2022-03-03 DIAGNOSIS — E559 Vitamin D deficiency, unspecified: Secondary | ICD-10-CM

## 2022-03-03 DIAGNOSIS — D571 Sickle-cell disease without crisis: Secondary | ICD-10-CM | POA: Diagnosis not present

## 2022-03-03 DIAGNOSIS — G894 Chronic pain syndrome: Secondary | ICD-10-CM | POA: Diagnosis not present

## 2022-03-03 DIAGNOSIS — F200 Paranoid schizophrenia: Secondary | ICD-10-CM

## 2022-03-03 MED ORDER — OXYCODONE-ACETAMINOPHEN 10-325 MG PO TABS: 1.0000 | ORAL_TABLET | ORAL | 0 refills | Status: DC | PRN

## 2022-03-03 NOTE — Telephone Encounter (Signed)
Hydroxyurea Vitamin D

## 2022-03-03 NOTE — Progress Notes (Signed)
Patient [facility]   Established Patient Office Visit  Subjective   Patient ID: Alejandro Soto, male    DOB: 01-Nov-1985  Age: 36 y.o. MRN: [id number]  Chief Complaint  Patient presents with   Follow-up    Pt is here for 3 month's SCD follow up visit. Pt is requesting refill on all medications     Alejandro Soto is a 36 year old male with a medical history significant for sickle cell disease, chronic pain syndrome, opiate dependence and tolerance, anemia of chronic disease, and paranoid schizophrenia presents for a follow up of chronic conditions. Patient says that he has had uncontrolled pain over the past several months. Patient has not had available pain medications over the past month. Pain intensity is 6/10 characterized as intermittent and aching.  He currently denies headache, shortness of breath, dizziness, nausea, vomiting, or diarrhea.     Patient Active Problem List   Diagnosis Date Noted   Opioid dependence, uncomplicated ([place]) 23/95/3202   Insomnia 03/23/2019   Chronic prescription opiate use 03/23/2019   Methadone use 08/12/2015   Chronic back pain greater than 3 months duration 04/09/2015   Abdominal pain, epigastric 12/24/2014   Sickle cell pain crisis ([place]) 12/16/2014   Thrombocytosis ([place]) 07/14/2014   Anemia 07/14/2014   Sore throat 07/03/2014   Sebaceous cyst 07/03/2014   Atopic dermatitis 06/12/2014   Leukocytosis 05/02/2014   Tobacco dependence 04/25/2014   Neck pain, bilateral 03/01/2014   Hb-SS disease without crisis ([place]) 02/22/2014   DJD (degenerative joint disease), lumbosacral 09/18/2013   Neuropathic pain 09/18/2013   Back pain, acute 08/11/2013   Opiate use 05/24/2013   Back pain 11/01/2012   Pain in joint, shoulder region 11/01/2012   Pain in joint, lower leg 11/01/2012   Vitamin D deficiency 04/21/2012   Encounter for therapeutic drug monitoring 04/21/2012   Paranoid schizophrenia ([place]) 04/21/2012    Priapism 04/21/2012   Smoking 04/21/2012   Intermittent pain 01/20/2012   Sickle cell disease, type SS ([place]) 01/20/2012   Past Medical History:  Diagnosis Date   Insomnia 08/2019   Pneumonia    Sickle cell anemia (HCC)    Past Surgical History:  Procedure Laterality Date   NO PAST SURGERIES     Social History   Tobacco Use   Smoking status: Some Days    Packs/day: 0.25    Years: 5.00    Total pack years: 1.25    Types: Cigarettes   Smokeless tobacco: Never  Vaping Use   Vaping Use: Never used  Substance Use Topics   Alcohol use: No    Alcohol/week: 0.0 standard drinks of alcohol   Drug use: No   Social History   Socioeconomic History   Marital status: Single    Spouse name: Not on file   Number of children: Not on file   Years of education: Not on file   Highest education level: Not on file  Occupational History   Not on file  Tobacco Use   Smoking status: Some Days    Packs/day: 0.25    Years: 5.00    Total pack years: 1.25    Types: Cigarettes   Smokeless tobacco: Never  Vaping Use   Vaping Use: Never used  Substance and Sexual Activity   Alcohol use: No    Alcohol/week: 0.0 standard drinks of alcohol   Drug use: No   Sexual activity: Yes    Birth control/protection: Condom  Other Topics Concern  Not on file  Social History Narrative   Not on file   Social Determinants of Health   Financial Resource Strain: Not on file  Food Insecurity: Not on file  Transportation Needs: Not on file  Physical Activity: Not on file  Stress: Not on file  Social Connections: Not on file  Intimate Partner Violence: Not on file   Family Status  Relation Name Status   MGM  Deceased   Father  (Not Specified)   Brother  (Not Specified)   Family History  Problem Relation Age of Onset   Cancer Maternal Grandmother        colon    Diabetes Father    Cancer Father 48       Prostate   Asthma Brother    Allergies  Allergen Reactions   Amoxicillin Diarrhea     Did it involve swelling of the face/tongue/throat, SOB, or low BP? No Did it involve sudden or severe rash/hives, skin peeling, or any reaction on the inside of your mouth or nose? No Did you need to seek medical attention at a hospital or doctor's office? No When did it last happen?      childhood If all above answers are "NO", may proceed with cephalosporin use.       Review of Systems  Constitutional: Negative.   HENT: Negative.    Eyes: Negative.   Respiratory: Negative.    Cardiovascular: Negative.   Genitourinary: Negative.   Musculoskeletal:  Positive for back pain and joint pain.  Skin: Negative.       Objective:     BP 108/64 (BP Location: Right Arm, Patient Position: Sitting, Cuff Size: Large)   Pulse 73   Ht '5\' 8"'  (1.727 m)   Wt 179 lb 14.4 oz (81.6 kg)   SpO2 100%   BMI 27.35 kg/m  BP Readings from Last 3 Encounters:  03/03/22 108/64  12/16/21 117/66  09/08/21 114/73   Wt Readings from Last 3 Encounters:  03/03/22 179 lb 14.4 oz (81.6 kg)  12/16/21 187 lb 9.6 oz (85.1 kg)  09/08/21 189 lb (85.7 kg)      Physical Exam Constitutional:      Appearance: Normal appearance. He is obese.  Eyes:     Pupils: Pupils are equal, round, and reactive to light.  Cardiovascular:     Rate and Rhythm: Normal rate and regular rhythm.     Pulses: Normal pulses.  Abdominal:     General: Bowel sounds are normal.  Musculoskeletal:        General: Normal range of motion.  Skin:    General: Skin is warm.  Neurological:     General: No focal deficit present.     Mental Status: Mental status is at baseline.  Psychiatric:        Mood and Affect: Mood normal.        Thought Content: Thought content normal.        Judgment: Judgment normal.      Results for orders placed or performed in visit on 03/03/22  Sickle Cell Panel  Result Value Ref Range   Glucose 91 70 - 99 mg/dL   BUN 9 6 - 20 mg/dL   Creatinine, Ser 0.65 (L) 0.76 - 1.27 mg/dL   eGFR 126 >59  mL/min/1.73   BUN/Creatinine Ratio 14 9 - 20   Sodium 137 134 - 144 mmol/L   Potassium 4.4 3.5 - 5.2 mmol/L   Chloride 100 96 - 106 mmol/L  CO2 22 20 - 29 mmol/L   Calcium 9.7 8.7 - 10.2 mg/dL   Total Protein 7.8 6.0 - 8.5 g/dL   Albumin 4.8 4.1 - 5.1 g/dL   Globulin, Total 3.0 1.5 - 4.5 g/dL   Albumin/Globulin Ratio 1.6 1.2 - 2.2   Bilirubin Total 0.9 0.0 - 1.2 mg/dL   Alkaline Phosphatase 59 44 - 121 IU/L   AST 27 0 - 40 IU/L   ALT 16 0 - 44 IU/L   Ferritin 483 (H) 30 - 400 ng/mL   Vit D, 25-Hydroxy 41.5 30.0 - 100.0 ng/mL   WBC 10.4 3.4 - 10.8 x10E3/uL   RBC 3.10 (L) 4.14 - 5.80 x10E6/uL   Hemoglobin 10.9 (L) 13.0 - 17.7 g/dL   Hematocrit 31.2 (L) 37.5 - 51.0 %   MCV 101 (H) 79 - 97 fL   MCH 35.2 (H) 26.6 - 33.0 pg   MCHC 34.9 31.5 - 35.7 g/dL   RDW 16.2 (H) 11.6 - 15.4 %   Platelets 554 (H) 150 - 450 x10E3/uL   Neutrophils 58 Not Estab. %   Lymphs 30 Not Estab. %   Monocytes 9 Not Estab. %   Eos 2 Not Estab. %   Basos 1 Not Estab. %   Neutrophils Absolute 6.1 1.4 - 7.0 x10E3/uL   Lymphocytes Absolute 3.1 0.7 - 3.1 x10E3/uL   Monocytes Absolute 0.9 0.1 - 0.9 x10E3/uL   EOS (ABSOLUTE) 0.2 0.0 - 0.4 x10E3/uL   Basophils Absolute 0.1 0.0 - 0.2 x10E3/uL   Immature Granulocytes 0 Not Estab. %   Immature Grans (Abs) 0.0 0.0 - 0.1 x10E3/uL   NRBC 8 (H) 0 - 0 %   Retic Ct Pct 8.0 (H) 0.6 - 2.6 %  [id number] 11+Oxyco+Alc+Crt-Bund  Result Value Ref Range   Ethanol Negative Cutoff=0.020 %   Amphetamines, Urine Negative Cutoff=1000 ng/mL   Barbiturate Negative Cutoff=200 ng/mL   BENZODIAZ UR QL Negative Cutoff=200 ng/mL   Cannabinoid Quant, Ur See Final Results Cutoff=50 ng/mL   Cocaine (Metabolite) Negative Cutoff=300 ng/mL   OPIATE SCREEN URINE Negative Cutoff=300 ng/mL   Oxycodone/Oxymorphone, Urine Negative Cutoff=300 ng/mL   Phencyclidine Negative Cutoff=25 ng/mL   Methadone Screen, Urine Negative Cutoff=300 ng/mL   Propoxyphene Negative Cutoff=300 ng/mL   Meperidine  Negative Cutoff=200 ng/mL   Tramadol Negative Cutoff=200 ng/mL   Creatinine 83.6 20.0 - 300.0 mg/dL   pH, Urine 5.9 4.5 - 8.9  Cannabinoid Conf, Ur  Result Value Ref Range   CANNABINOIDS Positive (A) Cutoff=50   Carboxy THC GC/MS Conf 48 Cutoff=15 ng/mL    Last CBC Lab Results  Component Value Date   WBC 10.4 03/03/2022   HGB 10.9 (L) 03/03/2022   HCT 31.2 (L) 03/03/2022   MCV 101 (H) 03/03/2022   MCH 35.2 (H) 03/03/2022   RDW 16.2 (H) 03/03/2022   PLT 554 (H) 56/43/3295   Last metabolic panel Lab Results  Component Value Date   GLUCOSE 91 03/03/2022   NA 137 03/03/2022   K 4.4 03/03/2022   CL 100 03/03/2022   CO2 22 03/03/2022   BUN 9 03/03/2022   CREATININE 0.65 (L) 03/03/2022   EGFR 126 03/03/2022   CALCIUM 9.7 03/03/2022   PROT 7.8 03/03/2022   ALBUMIN 4.8 03/03/2022   LABGLOB 3.0 03/03/2022   AGRATIO 1.6 03/03/2022   BILITOT 0.9 03/03/2022   ALKPHOS 59 03/03/2022   AST 27 03/03/2022   ALT 16 03/03/2022   ANIONGAP 10 07/31/2020   Last lipids Lab Results  Component  Value Date   CHOL 136 04/10/2021   HDL 31 (L) 04/10/2021   LDLCALC 84 04/10/2021   TRIG 111 04/10/2021   CHOLHDL 4.4 04/10/2021   Last hemoglobin A1c Lab Results  Component Value Date   HGBA1C <4.0 09/07/2013   Last thyroid functions Lab Results  Component Value Date   TSH 1.070 07/11/2021   Last vitamin D Lab Results  Component Value Date   VD25OH 41.5 03/03/2022   Last vitamin B12 and Folate Lab Results  Component Value Date   FOLATE 10.0 12/16/2021      The ASCVD Risk score (Arnett DK, et al., 2019) failed to calculate for the following reasons:   The 2019 ASCVD risk score is only valid for ages 61 to 3    Assessment & Plan:   Problem List Items Addressed This Visit       Other   Vitamin D deficiency   Relevant Medications   oxyCODONE-acetaminophen (PERCOCET) 10-325 MG tablet   Other Relevant Orders   Sickle Cell Panel (Completed)   Sickle cell disease, type SS  ([place])   Relevant Orders   [id number] 11+Oxyco+Alc+Crt-Bund (Completed)   Paranoid schizophrenia ([place])   Hb-SS disease without crisis ([place]) - Primary   Other Visit Diagnoses     Chronic pain syndrome       Relevant Medications   oxyCODONE-acetaminophen (PERCOCET) 10-325 MG tablet   Other Relevant Orders   [id number] 11+Oxyco+Alc+Crt-Bund (Completed)   Cannabinoid Conf, Ur (Completed)      1. Hb-SS disease without crisis ([place])  - oxyCODONE-acetaminophen (PERCOCET) 10-325 MG tablet; Take 1 tablet by mouth every 4 (four) hours as needed for pain.  Dispense: 30 tablet; Refill: 0  2. Chronic pain syndrome Reviewed PDMP substance reporting system prior to prescribing opiate medications. No inconsistencies noted.   - oxyCODONE-acetaminophen (PERCOCET) 10-325 MG tablet; Take 1 tablet by mouth every 4 (four) hours as needed for pain.  Dispense: 30 tablet; Refill: 0 - [id number] 11+Oxyco+Alc+Crt-Bund - Cannabinoid Conf, Ur  3. Vitamin D deficiency  - Sickle Cell Panel  4. 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 Paranoid schizophrenia ([place])   Return in about 3 months (around 06/02/2022) for sickle cell anemia.   Donia Pounds  APRN, MSN, FNP-C [facility] [address] [place], [place] [address] [phone]

## 2022-03-04 LAB — CMP14+CBC/D/PLT+FER+RETIC+V...
ALT: 16 IU/L (ref 0–44)
AST: 27 IU/L (ref 0–40)
Albumin/Globulin Ratio: 1.6 (ref 1.2–2.2)
Albumin: 4.8 g/dL (ref 4.1–5.1)
Alkaline Phosphatase: 59 IU/L (ref 44–121)
BUN/Creatinine Ratio: 14 (ref 9–20)
BUN: 9 mg/dL (ref 6–20)
Basophils Absolute: 0.1 10*3/uL (ref 0.0–0.2)
Basos: 1 %
Bilirubin Total: 0.9 mg/dL (ref 0.0–1.2)
CO2: 22 mmol/L (ref 20–29)
Calcium: 9.7 mg/dL (ref 8.7–10.2)
Chloride: 100 mmol/L (ref 96–106)
Creatinine, Ser: 0.65 mg/dL — ABNORMAL LOW (ref 0.76–1.27)
EOS (ABSOLUTE): 0.2 10*3/uL (ref 0.0–0.4)
Eos: 2 %
Ferritin: 483 ng/mL — ABNORMAL HIGH (ref 30–400)
Globulin, Total: 3 g/dL (ref 1.5–4.5)
Glucose: 91 mg/dL (ref 70–99)
Hematocrit: 31.2 % — ABNORMAL LOW (ref 37.5–51.0)
Hemoglobin: 10.9 g/dL — ABNORMAL LOW (ref 13.0–17.7)
Immature Grans (Abs): 0 10*3/uL (ref 0.0–0.1)
Immature Granulocytes: 0 %
Lymphocytes Absolute: 3.1 10*3/uL (ref 0.7–3.1)
Lymphs: 30 %
MCH: 35.2 pg — ABNORMAL HIGH (ref 26.6–33.0)
MCHC: 34.9 g/dL (ref 31.5–35.7)
MCV: 101 fL — ABNORMAL HIGH (ref 79–97)
Monocytes Absolute: 0.9 10*3/uL (ref 0.1–0.9)
Monocytes: 9 %
NRBC: 8 % — ABNORMAL HIGH (ref 0–0)
Neutrophils Absolute: 6.1 10*3/uL (ref 1.4–7.0)
Neutrophils: 58 %
Platelets: 554 10*3/uL — ABNORMAL HIGH (ref 150–450)
Potassium: 4.4 mmol/L (ref 3.5–5.2)
RBC: 3.1 x10E6/uL — ABNORMAL LOW (ref 4.14–5.80)
RDW: 16.2 % — ABNORMAL HIGH (ref 11.6–15.4)
Retic Ct Pct: 8 % — ABNORMAL HIGH (ref 0.6–2.6)
Sodium: 137 mmol/L (ref 134–144)
Total Protein: 7.8 g/dL (ref 6.0–8.5)
Vit D, 25-Hydroxy: 41.5 ng/mL (ref 30.0–100.0)
WBC: 10.4 10*3/uL (ref 3.4–10.8)
eGFR: 126 mL/min/{1.73_m2} (ref >59)

## 2022-03-08 LAB — DRUG SCREEN 764883 11+OXYCO+ALC+CRT-BUND
Amphetamines, Urine: NEGATIVE ng/mL
BENZODIAZ UR QL: NEGATIVE ng/mL
Barbiturate: NEGATIVE ng/mL
Cocaine (Metabolite): NEGATIVE ng/mL
Creatinine: 83.6 mg/dL (ref 20.0–300.0)
Ethanol: NEGATIVE %
Meperidine: NEGATIVE ng/mL
Methadone Screen, Urine: NEGATIVE ng/mL
OPIATE SCREEN URINE: NEGATIVE ng/mL
Oxycodone/Oxymorphone, Urine: NEGATIVE ng/mL
Phencyclidine: NEGATIVE ng/mL
Propoxyphene: NEGATIVE ng/mL
Tramadol: NEGATIVE ng/mL
pH, Urine: 5.9 (ref 4.5–8.9)

## 2022-03-08 LAB — CANNABINOID CONFIRMATION, UR
CANNABINOIDS: POSITIVE — AB
Carboxy THC GC/MS Conf: 48 ng/mL

## 2022-03-18 ENCOUNTER — Ambulatory Visit: Payer: Medicaid Other | Admitting: Nurse Practitioner

## 2022-03-31 ENCOUNTER — Other Ambulatory Visit: Payer: Self-pay | Admitting: Family Medicine

## 2022-03-31 DIAGNOSIS — D571 Sickle-cell disease without crisis: Secondary | ICD-10-CM

## 2022-03-31 DIAGNOSIS — G894 Chronic pain syndrome: Secondary | ICD-10-CM

## 2022-03-31 MED ORDER — OXYCODONE-ACETAMINOPHEN 10-325 MG PO TABS: 1.0000 | ORAL_TABLET | ORAL | 0 refills | Status: DC | PRN

## 2022-03-31 NOTE — Progress Notes (Signed)
Reviewed PDMP substance reporting system prior to prescribing opiate medications. No inconsistencies noted.  Meds ordered this encounter  Medications   oxyCODONE-acetaminophen (PERCOCET) 10-325 MG tablet    Sig: Take 1 tablet by mouth every 4 (four) hours as needed for pain.    Dispense:  30 tablet    Refill:  0    Order Specific Question:   Supervising Provider    Answer:   JEGEDE, OLUGBEMIGA E [[id number]]   Alejandro Kunert Moore Jaterrius Ricketson  APRN, MSN, FNP-C [facility] [address]  [place], [place] [address] [phone]  

## 2022-04-10 ENCOUNTER — Other Ambulatory Visit: Payer: Self-pay | Admitting: Family Medicine

## 2022-04-10 DIAGNOSIS — D571 Sickle-cell disease without crisis: Secondary | ICD-10-CM

## 2022-04-10 DIAGNOSIS — G894 Chronic pain syndrome: Secondary | ICD-10-CM

## 2022-04-10 MED ORDER — OXYCODONE-ACETAMINOPHEN 10-325 MG PO TABS: 1.0000 | ORAL_TABLET | ORAL | 0 refills | Status: DC | PRN

## 2022-04-10 NOTE — Progress Notes (Signed)
Reviewed PDMP substance reporting system prior to prescribing opiate medications. No inconsistencies noted.  Meds ordered this encounter  Medications   oxyCODONE-acetaminophen (PERCOCET) 10-325 MG tablet    Sig: Take 1 tablet by mouth every 4 (four) hours as needed for pain.    Dispense:  30 tablet    Refill:  0    Order Specific Question:   Supervising Provider    Answer:   JEGEDE, OLUGBEMIGA E [[id number]]   Alejandro Soto Kensey Luepke  APRN, MSN, FNP-C [facility] [address]  [place], [place] [address] [phone]  

## 2022-04-29 ENCOUNTER — Other Ambulatory Visit: Payer: Self-pay | Admitting: Family Medicine

## 2022-04-29 DIAGNOSIS — G894 Chronic pain syndrome: Secondary | ICD-10-CM

## 2022-04-29 DIAGNOSIS — D571 Sickle-cell disease without crisis: Secondary | ICD-10-CM

## 2022-04-29 MED ORDER — OXYCODONE-ACETAMINOPHEN 10-325 MG PO TABS: 1.0000 | ORAL_TABLET | ORAL | 0 refills | Status: DC | PRN

## 2022-04-29 NOTE — Telephone Encounter (Signed)
From: [name] To: Office of Cammie Sickle, [place] Sent: 04/29/2022 10:53 AM EST Subject: Medication Renewal Request  Refills have been requested for the following medications:   oxyCODONE-acetaminophen (PERCOCET) 10-325 MG tablet [Nasiyah Laverdiere]  Preferred pharmacy: [id number] [place], [place] - [address] method: PArlyss Gandy

## 2022-04-29 NOTE — Telephone Encounter (Signed)
Reviewed PDMP substance reporting system prior to prescribing opiate medications. No inconsistencies noted.  Meds ordered this encounter  Medications   oxyCODONE-acetaminophen (PERCOCET) 10-325 MG tablet    Sig: Take 1 tablet by mouth every 4 (four) hours as needed for pain.    Dispense:  30 tablet    Refill:  0    Order Specific Question:   Supervising Provider    Answer:   JEGEDE, OLUGBEMIGA E [[id number]]   Shamariah Shewmake Moore Chiann Goffredo  APRN, MSN, FNP-C [facility] [address]  [place], [place] [address] [phone]  

## 2022-05-11 ENCOUNTER — Other Ambulatory Visit: Payer: Self-pay | Admitting: Family Medicine

## 2022-05-11 DIAGNOSIS — G894 Chronic pain syndrome: Secondary | ICD-10-CM

## 2022-05-11 DIAGNOSIS — D571 Sickle-cell disease without crisis: Secondary | ICD-10-CM

## 2022-05-11 MED ORDER — OXYCODONE-ACETAMINOPHEN 10-325 MG PO TABS: 1.0000 | ORAL_TABLET | ORAL | 0 refills | Status: DC | PRN

## 2022-05-11 NOTE — Progress Notes (Signed)
Reviewed PDMP substance reporting system prior to prescribing opiate medications. No inconsistencies noted.  Meds ordered this encounter  Medications   oxyCODONE-acetaminophen (PERCOCET) 10-325 MG tablet    Sig: Take 1 tablet by mouth every 4 (four) hours as needed for pain.    Dispense:  30 tablet    Refill:  0    Order Specific Question:   Supervising Provider    Answer:   JEGEDE, OLUGBEMIGA E [[id number]]   Alejandro Soto Alejandro Ehresman  APRN, MSN, FNP-C [facility] [address]  [place], [place] [address] [phone]  

## 2022-05-22 ENCOUNTER — Other Ambulatory Visit: Payer: Self-pay | Admitting: Family Medicine

## 2022-05-22 DIAGNOSIS — G894 Chronic pain syndrome: Secondary | ICD-10-CM

## 2022-05-22 DIAGNOSIS — D571 Sickle-cell disease without crisis: Secondary | ICD-10-CM

## 2022-05-22 MED ORDER — OXYCODONE-ACETAMINOPHEN 10-325 MG PO TABS: 1.0000 | ORAL_TABLET | ORAL | 0 refills | Status: DC | PRN

## 2022-05-22 NOTE — Telephone Encounter (Signed)
From: [name] To: Office of Cammie Sickle, [place] Sent: 05/22/2022 7:12 AM EST Subject: Medication Renewal Request  Refills have been requested for the following medications:   oxyCODONE-acetaminophen (PERCOCET) 10-325 MG tablet [Charmine Bockrath]  Preferred pharmacy: [id number] [place],  - [address] method: PArlyss Gandy

## 2022-05-22 NOTE — Telephone Encounter (Signed)
Reviewed PDMP substance reporting system prior to prescribing opiate medications. No inconsistencies noted.  Meds ordered this encounter  Medications   oxyCODONE-acetaminophen (PERCOCET) 10-325 MG tablet    Sig: Take 1 tablet by mouth every 4 (four) hours as needed for pain.    Dispense:  30 tablet    Refill:  0    Order Specific Question:   Supervising Provider    Answer:   JEGEDE, OLUGBEMIGA E [[id number]]   Alejandro Iglesia Moore Rockey Guarino  APRN, MSN, FNP-C [facility] [address]  [place], [place] [address] [phone]  

## 2022-06-02 ENCOUNTER — Encounter: Payer: Self-pay | Admitting: Family Medicine

## 2022-06-02 ENCOUNTER — Ambulatory Visit (INDEPENDENT_AMBULATORY_CARE_PROVIDER_SITE_OTHER): Payer: Medicare Other | Admitting: Family Medicine

## 2022-06-02 VITALS — BP 105/56 | HR 79 | Temp 97.9°F | Ht 68.0 in | Wt 194.2 lb

## 2022-06-02 DIAGNOSIS — E559 Vitamin D deficiency, unspecified: Secondary | ICD-10-CM | POA: Diagnosis not present

## 2022-06-02 DIAGNOSIS — D571 Sickle-cell disease without crisis: Secondary | ICD-10-CM | POA: Diagnosis not present

## 2022-06-02 DIAGNOSIS — F200 Paranoid schizophrenia: Secondary | ICD-10-CM

## 2022-06-02 DIAGNOSIS — G894 Chronic pain syndrome: Secondary | ICD-10-CM | POA: Diagnosis not present

## 2022-06-02 DIAGNOSIS — D5709 Hb-ss disease with crisis with other specified complication: Secondary | ICD-10-CM

## 2022-06-02 MED ORDER — OXYCODONE-ACETAMINOPHEN 10-325 MG PO TABS: 1.0000 | ORAL_TABLET | ORAL | 0 refills | Status: DC | PRN

## 2022-06-02 MED ORDER — FOLIC ACID 1 MG PO TABS: 1.0000 mg | ORAL_TABLET | Freq: Every day | ORAL | 11 refills | Status: AC

## 2022-06-02 MED ORDER — HYDROXYUREA 500 MG PO CAPS: 1500.0000 mg | ORAL_CAPSULE | Freq: Every day | ORAL | 3 refills | Status: DC

## 2022-06-02 NOTE — Progress Notes (Signed)
Established Patient Office Visit  Subjective   Patient ID: Edon Hoadley, male    DOB: 11-12-1985  Age: 36 y.o. MRN: [id number]  Chief Complaint  Patient presents with   Follow-up    Sickle cell     Daelen Belvedere is a 36 year old male with a medical history significant for sickle cell disease, chronic pain syndrome, anemia of chronic disease, opiate dependence and tolerance, paranoid schizophrenia, and history of anemia of chronic disease presents for follow-up of chronic conditions.  Patient has a history of sickle cell disease with chronic pain.  Chronic pain is primarily to his back and lower extremities.  Today, patient rates his pain as 5/10.  He is taking Percocet 10-325 mg consistently.  He says that this medication has been ineffective at times for pain.  He denies headache, chest pain, urinary symptoms, nausea, vomiting, or diarrhea. Patient has a history of paranoid schizophrenia.  He states that he has gone to therapy, but has not establish care with a psychiatrist.  Patient continues to take Risperdal daily.  However, he has not restarted BuSpar or trazodone since hospital discharge.  He denies any suicidal or homicidal ideations.  No visual or auditory hallucinations today.    Patient Active Problem List   Diagnosis Date Noted   Opioid dependence, uncomplicated ([place]) 15/17/6160   Insomnia 03/23/2019   Chronic prescription opiate use 03/23/2019   Methadone use 08/12/2015   Chronic back pain greater than 3 months duration 04/09/2015   Abdominal pain, epigastric 12/24/2014   Sickle cell pain crisis ([place]) 12/16/2014   Thrombocytosis ([place]) 07/14/2014   Anemia 07/14/2014   Sore throat 07/03/2014   Sebaceous cyst 07/03/2014   Atopic dermatitis 06/12/2014   Leukocytosis 05/02/2014   Tobacco dependence 04/25/2014   Neck pain, bilateral 03/01/2014   Hb-SS disease without crisis ([place]) 02/22/2014   DJD (degenerative joint disease), lumbosacral 09/18/2013   Neuropathic pain  09/18/2013   Back pain, acute 08/11/2013   Opiate use 05/24/2013   Back pain 11/01/2012   Pain in joint, shoulder region 11/01/2012   Pain in joint, lower leg 11/01/2012   Vitamin D deficiency 04/21/2012   Encounter for therapeutic drug monitoring 04/21/2012   Paranoid schizophrenia ([place]) 04/21/2012   Priapism 04/21/2012   Smoking 04/21/2012   Intermittent pain 01/20/2012   Sickle cell disease, type SS ([place]) 01/20/2012   Past Medical History:  Diagnosis Date   Insomnia 08/2019   Pneumonia    Sickle cell anemia (HCC)    Past Surgical History:  Procedure Laterality Date   NO PAST SURGERIES     Social History   Tobacco Use   Smoking status: Some Days    Packs/day: 0.25    Years: 5.00    Total pack years: 1.25    Types: Cigarettes   Smokeless tobacco: Never  Vaping Use   Vaping Use: Never used  Substance Use Topics   Alcohol use: No    Alcohol/week: 0.0 standard drinks of alcohol   Drug use: No   Social History   Socioeconomic History   Marital status: Single    Spouse name: Not on file   Number of children: Not on file   Years of education: Not on file   Highest education level: Not on file  Occupational History   Not on file  Tobacco Use   Smoking status: Some Days    Packs/day: 0.25    Years: 5.00    Total pack years: 1.25    Types: Cigarettes  Smokeless tobacco: Never  Vaping Use   Vaping Use: Never used  Substance and Sexual Activity   Alcohol use: No    Alcohol/week: 0.0 standard drinks of alcohol   Drug use: No   Sexual activity: Yes    Birth control/protection: Condom  Other Topics Concern   Not on file  Social History Narrative   Not on file   Social Determinants of Health   Financial Resource Strain: Not on file  Food Insecurity: Not on file  Transportation Needs: Not on file  Physical Activity: Not on file  Stress: Not on file  Social Connections: Not on file  Intimate Partner Violence: Not on file   Family Status  Relation Name  Status   MGM  Deceased   Father  (Not Specified)   Brother  (Not Specified)   Family History  Problem Relation Age of Onset   Cancer Maternal Grandmother        colon    Diabetes Father    Cancer Father 32       Prostate   Asthma Brother    Allergies  Allergen Reactions   Amoxicillin Diarrhea    Did it involve swelling of the face/tongue/throat, SOB, or low BP? No Did it involve sudden or severe rash/hives, skin peeling, or any reaction on the inside of your mouth or nose? No Did you need to seek medical attention at a hospital or doctor's office? No When did it last happen?      childhood If all above answers are "NO", may proceed with cephalosporin use.       Review of Systems  Constitutional: Negative.   HENT: Negative.    Eyes: Negative.   Cardiovascular: Negative.   Gastrointestinal: Negative.   Genitourinary: Negative.   Musculoskeletal:  Positive for back pain and joint pain.  Skin: Negative.   Neurological: Negative.   Psychiatric/Behavioral:  Negative for substance abuse and suicidal ideas.       Objective:     BP (!) 105/56   Pulse 79   Temp 97.9 F (36.6 C)   Ht _0  (1.727 m)   Wt 194 lb 3.2 oz (88.1 kg)   SpO2 96%   BMI 29.53 kg/m  BP Readings from Last 3 Encounters:  06/02/22 (!) 105/56  03/03/22 108/64  12/16/21 117/66   Wt Readings from Last 3 Encounters:  06/02/22 194 lb 3.2 oz (88.1 kg)  03/03/22 179 lb 14.4 oz (81.6 kg)  12/16/21 187 lb 9.6 oz (85.1 kg)      Physical Exam Eyes:     Pupils: Pupils are equal, round, and reactive to light.  Cardiovascular:     Rate and Rhythm: Normal rate and regular rhythm.  Pulmonary:     Breath sounds: Normal breath sounds.  Abdominal:     General: Bowel sounds are normal.     Palpations: Abdomen is soft.  Musculoskeletal:        General: Normal range of motion.  Skin:    General: Skin is warm.  Neurological:     Mental Status: He is alert.  Psychiatric:        Mood and Affect: Mood  normal.        Behavior: Behavior normal.        Thought Content: Thought content normal.        Judgment: Judgment normal.      No results found for any visits on 06/02/22.  Last CBC Lab Results  Component Value Date  WBC 10.4 03/03/2022   HGB 10.9 (L) 03/03/2022   HCT 31.2 (L) 03/03/2022   MCV 101 (H) 03/03/2022   MCH 35.2 (H) 03/03/2022   RDW 16.2 (H) 03/03/2022   PLT 554 (H) 05/07/5207   Last metabolic panel Lab Results  Component Value Date   GLUCOSE 91 03/03/2022   NA 137 03/03/2022   K 4.4 03/03/2022   CL 100 03/03/2022   CO2 22 03/03/2022   BUN 9 03/03/2022   CREATININE 0.65 (L) 03/03/2022   EGFR 126 03/03/2022   CALCIUM 9.7 03/03/2022   PROT 7.8 03/03/2022   ALBUMIN 4.8 03/03/2022   LABGLOB 3.0 03/03/2022   AGRATIO 1.6 03/03/2022   BILITOT 0.9 03/03/2022   ALKPHOS 59 03/03/2022   AST 27 03/03/2022   ALT 16 03/03/2022   ANIONGAP 10 07/31/2020   Last lipids Lab Results  Component Value Date   CHOL 136 04/10/2021   HDL 31 (L) 04/10/2021   LDLCALC 84 04/10/2021   TRIG 111 04/10/2021   CHOLHDL 4.4 04/10/2021   Last hemoglobin A1c Lab Results  Component Value Date   HGBA1C <4.0 09/07/2013   Last thyroid functions Lab Results  Component Value Date   TSH 1.070 07/11/2021   Last vitamin D Lab Results  Component Value Date   VD25OH 41.5 03/03/2022   Last vitamin B12 and Folate Lab Results  Component Value Date   FOLATE 10.0 12/16/2021      The ASCVD Risk score (Arnett DK, et al., 2019) failed to calculate for the following reasons:   The 2019 ASCVD risk score is only valid for ages 43 to 35    Assessment & Plan:   Problem List Items Addressed This Visit       Other   Vitamin D deficiency   Relevant Orders   Sickle Cell Panel   Sickle cell disease, type SS ([place])   Relevant Medications   folic acid (FOLVITE) 1 MG tablet   hydroxyurea (HYDREA) 500 MG capsule   Paranoid schizophrenia ([place])   Relevant Orders   Ambulatory referral  to Psychiatry   Hb-SS disease without crisis ([place]) - Primary   Relevant Medications   folic acid (FOLVITE) 1 MG tablet   oxyCODONE-acetaminophen (PERCOCET) 10-325 MG tablet   Other Relevant Orders   [id number] 11+Oxyco+Alc+Crt-Bund   Sickle Cell Panel   Ambulatory referral to Ophthalmology   Other Visit Diagnoses     Chronic pain syndrome       Relevant Medications   oxyCODONE-acetaminophen (PERCOCET) 10-325 MG tablet   Other Relevant Orders   [id number] 11+Oxyco+Alc+Crt-Bund      Immunization History  Administered Date(s) Administered   Influenza Whole 03/21/2012   Influenza,inj,Quad PF,6+ Mos 03/21/2013, 03/01/2014   Pneumococcal Conjugate-13 03/21/2012   Pneumococcal Polysaccharide-23 07/15/2014   Tdap 08/14/2016    1. Hb-SS disease without crisis [facility]) Patient will continue to follow-up every 3 months for sickle cell disease.  We will continue hydroxyurea and folic acid at the current doses. Patient refuses vaccinations - hydroxyurea (HYDREA) 500 MG capsule; Take 3 capsules (1,500 mg total) by mouth daily. May take with food to minimize GI side effects.  Dispense: 270 capsule; Refill: 3 - [id number] 11+Oxyco+Alc+Crt-Bund - Sickle Cell Panel - folic acid (FOLVITE) 1 MG tablet; Take 1 tablet (1 mg total) by mouth daily.  Dispense: 30 tablet; Refill: 11 - Ambulatory referral to Ophthalmology - oxyCODONE-acetaminophen (PERCOCET) 10-325 MG tablet; Take 1 tablet by mouth every 4 (four) hours as needed for pain.  Dispense: 30  tablet; Refill: 0  2. Chronic pain syndrome Reviewed PDMP substance reporting system prior to prescribing opiate medications. No inconsistencies noted.   - [id number] 11+Oxyco+Alc+Crt-Bund - oxyCODONE-acetaminophen (PERCOCET) 10-325 MG tablet; Take 1 tablet by mouth every 4 (four) hours as needed for pain.  Dispense: 30 tablet; Refill: 0  3. Vitamin D deficiency  - Sickle Cell Panel  4. Paranoid schizophrenia ([place]) Patient will need to establish care with  psychiatry for medication management.  At this time, we will continue Risperdal as previously scheduled during behavioral health hospitalization. - Ambulatory referral to Psychiatry  Return in about 3 months (around 09/01/2022) for sickle cell anemia.   Donia Pounds  APRN, MSN, FNP-C Patient [place] [address] [place], [place] [address] [phone]

## 2022-06-03 LAB — CMP14+CBC/D/PLT+FER+RETIC+V...
ALT: 18 IU/L (ref 0–44)
AST: 29 IU/L (ref 0–40)
Albumin/Globulin Ratio: 1.7 (ref 1.2–2.2)
Albumin: 4.8 g/dL (ref 4.1–5.1)
Alkaline Phosphatase: 69 IU/L (ref 44–121)
BUN/Creatinine Ratio: 9 (ref 9–20)
BUN: 6 mg/dL (ref 6–20)
Basophils Absolute: 0.1 10*3/uL (ref 0.0–0.2)
Basos: 0 %
Bilirubin Total: 1 mg/dL (ref 0.0–1.2)
CO2: 22 mmol/L (ref 20–29)
Calcium: 9.5 mg/dL (ref 8.7–10.2)
Chloride: 101 mmol/L (ref 96–106)
Creatinine, Ser: 0.68 mg/dL — ABNORMAL LOW (ref 0.76–1.27)
EOS (ABSOLUTE): 0.4 10*3/uL (ref 0.0–0.4)
Eos: 2 %
Ferritin: 695 ng/mL — ABNORMAL HIGH (ref 30–400)
Globulin, Total: 2.9 g/dL (ref 1.5–4.5)
Glucose: 90 mg/dL (ref 70–99)
Hematocrit: 26.6 % — ABNORMAL LOW (ref 37.5–51.0)
Hemoglobin: 9.4 g/dL — ABNORMAL LOW (ref 13.0–17.7)
Immature Grans (Abs): 0 10*3/uL (ref 0.0–0.1)
Immature Granulocytes: 0 %
Lymphocytes Absolute: 6.3 10*3/uL — ABNORMAL HIGH (ref 0.7–3.1)
Lymphs: 43 %
MCH: 34.4 pg — ABNORMAL HIGH (ref 26.6–33.0)
MCHC: 35.3 g/dL (ref 31.5–35.7)
MCV: 97 fL (ref 79–97)
Monocytes Absolute: 1.2 10*3/uL — ABNORMAL HIGH (ref 0.1–0.9)
Monocytes: 8 %
NRBC: 4 % — ABNORMAL HIGH (ref 0–0)
Neutrophils Absolute: 6.9 10*3/uL (ref 1.4–7.0)
Neutrophils: 47 %
Platelets: 528 10*3/uL — ABNORMAL HIGH (ref 150–450)
Potassium: 4.4 mmol/L (ref 3.5–5.2)
RBC: 2.73 x10E6/uL — CL (ref 4.14–5.80)
RDW: 15.9 % — ABNORMAL HIGH (ref 11.6–15.4)
Retic Ct Pct: 6.8 % — ABNORMAL HIGH (ref 0.6–2.6)
Sodium: 137 mmol/L (ref 134–144)
Total Protein: 7.7 g/dL (ref 6.0–8.5)
Vit D, 25-Hydroxy: 30.7 ng/mL (ref 30.0–100.0)
WBC: 14.8 10*3/uL — ABNORMAL HIGH (ref 3.4–10.8)
eGFR: 124 mL/min/{1.73_m2} (ref >59)

## 2022-06-08 LAB — OXYCODONE/OXYMORPHONE, CONFIRM
OXYCODONE/OXYMORPH: POSITIVE — AB
OXYCODONE: >3000 ng/mL
OXYCODONE: POSITIVE — AB
OXYMORPHONE (GC/MS): 1466 ng/mL
OXYMORPHONE: POSITIVE — AB

## 2022-06-08 LAB — DRUG SCREEN 764883 11+OXYCO+ALC+CRT-BUND
Amphetamines, Urine: NEGATIVE ng/mL
BENZODIAZ UR QL: NEGATIVE ng/mL
Barbiturate: NEGATIVE ng/mL
Cannabinoid Quant, Ur: NEGATIVE ng/mL
Cocaine (Metabolite): NEGATIVE ng/mL
Creatinine: 80.5 mg/dL (ref 20.0–300.0)
Ethanol: NEGATIVE %
Meperidine: NEGATIVE ng/mL
Methadone Screen, Urine: NEGATIVE ng/mL
Phencyclidine: NEGATIVE ng/mL
Propoxyphene: NEGATIVE ng/mL
Tramadol: NEGATIVE ng/mL
pH, Urine: 5.1 (ref 4.5–8.9)

## 2022-06-08 LAB — OPIATES CONFIRMATION, URINE: Opiates: NEGATIVE ng/mL

## 2022-06-18 ENCOUNTER — Other Ambulatory Visit: Payer: Self-pay

## 2022-06-18 DIAGNOSIS — D571 Sickle-cell disease without crisis: Secondary | ICD-10-CM

## 2022-06-18 DIAGNOSIS — G894 Chronic pain syndrome: Secondary | ICD-10-CM

## 2022-06-23 ENCOUNTER — Other Ambulatory Visit: Payer: Self-pay | Admitting: Family Medicine

## 2022-06-23 DIAGNOSIS — G894 Chronic pain syndrome: Secondary | ICD-10-CM

## 2022-06-23 DIAGNOSIS — D571 Sickle-cell disease without crisis: Secondary | ICD-10-CM

## 2022-06-23 MED ORDER — OXYCODONE-ACETAMINOPHEN 10-325 MG PO TABS: 1.0000 | ORAL_TABLET | ORAL | 0 refills | Status: DC | PRN

## 2022-06-23 NOTE — Telephone Encounter (Signed)
Reviewed PDMP substance reporting system prior to prescribing opiate medications. No inconsistencies noted.  Meds ordered this encounter  Medications   oxyCODONE-acetaminophen (PERCOCET) 10-325 MG tablet    Sig: Take 1 tablet by mouth every 4 (four) hours as needed for pain.    Dispense:  30 tablet    Refill:  0    Order Specific Question:   Supervising Provider    Answer:   JEGEDE, OLUGBEMIGA E [[id number]]   Breaker Springer Moore Breslyn Abdo  APRN, MSN, FNP-C [facility] [address]  [place], [place] [address] [phone]  

## 2022-06-23 NOTE — Telephone Encounter (Signed)
From: [name] To: Office of Cammie Sickle, [place] Sent: 06/23/2022 12:15 PM EST Subject: Medication Renewal Request  Refills have been requested for the following medications:   oxyCODONE-acetaminophen (PERCOCET) 10-325 MG tablet [Selia Wareing]  [id number] [place] [place] [facility] [address] Delivery method: PArlyss Gandy

## 2022-07-01 NOTE — Telephone Encounter (Signed)
Medication was sent in 06/23/21. [place]

## 2022-07-10 ENCOUNTER — Telehealth: Payer: Self-pay

## 2022-07-10 ENCOUNTER — Other Ambulatory Visit: Payer: Self-pay

## 2022-07-10 DIAGNOSIS — G894 Chronic pain syndrome: Secondary | ICD-10-CM

## 2022-07-10 DIAGNOSIS — D571 Sickle-cell disease without crisis: Secondary | ICD-10-CM

## 2022-07-10 MED ORDER — OXYCODONE-ACETAMINOPHEN 10-325 MG PO TABS: 1.0000 | ORAL_TABLET | ORAL | 0 refills | Status: DC | PRN

## 2022-07-10 NOTE — Telephone Encounter (Signed)
Please advise K[facility] 

## 2022-07-10 NOTE — Telephone Encounter (Signed)
Pt advised he needed some forms fill out for the dmv. Pt was advised that we have 7 to 10 days to complete these form. Pt was also advised to always have forms at the time of visit. Please advise if you are ok with filling this out and info concerning the psych appointment is on a the forms.  Place in important folder.   Elyse Jarvis RMA

## 2022-07-17 NOTE — Telephone Encounter (Signed)
Advised pt and a copy was mailed to him. Alejandro Soto

## 2022-07-27 ENCOUNTER — Other Ambulatory Visit: Payer: Self-pay

## 2022-07-27 ENCOUNTER — Other Ambulatory Visit: Payer: Self-pay | Admitting: Family Medicine

## 2022-07-27 DIAGNOSIS — G894 Chronic pain syndrome: Secondary | ICD-10-CM

## 2022-07-27 DIAGNOSIS — D571 Sickle-cell disease without crisis: Secondary | ICD-10-CM

## 2022-07-27 MED ORDER — OXYCODONE-ACETAMINOPHEN 10-325 MG PO TABS: 1.0000 | ORAL_TABLET | ORAL | 0 refills | Status: DC | PRN

## 2022-07-27 NOTE — Telephone Encounter (Signed)
Caller & Relationship to patient:  MRN #  [id number]   Call Back Number:   Date of Last Office Visit: 07/10/2022     Date of Next Office Visit: 09/01/2022    Medication(s) to be Refilled: OxyCODONE  [facility]  ** Please notify patient to allow 48-72 hours to process** **Let patient know to contact pharmacy at the end of the day to make sure medication is ready. ** **If patient has not been seen in a year or longer, book an appointment **Advise to use MyChart for refill requests OR to contact their pharmacy

## 2022-07-27 NOTE — Telephone Encounter (Signed)
From: [name] To: Office of Cammie Sickle, [place] Sent: 07/27/2022 10:31 AM EST Subject: Medication Renewal Request  Refills have been requested for the following medications:   oxyCODONE-acetaminophen (PERCOCET) 10-325 MG tablet [Lachina Hollis]  [id number] [place] [place] [facility] [address] [facility] [address] Delivery method: PArlyss Gandy

## 2022-07-27 NOTE — Progress Notes (Signed)
Reviewed PDMP substance reporting system prior to prescribing opiate medications. No inconsistencies noted.  Meds ordered this encounter  Medications   oxyCODONE-acetaminophen (PERCOCET) 10-325 MG tablet    Sig: Take 1 tablet by mouth every 4 (four) hours as needed for pain.    Dispense:  30 tablet    Refill:  0    Order Specific Question:   Supervising Provider    Answer:   JEGEDE, OLUGBEMIGA E [[id number]]   Alejandro Didio Moore Dorothey Oetken  APRN, MSN, FNP-C [facility] [address]  [place], [place] [address] [phone]  

## 2022-08-10 ENCOUNTER — Encounter (HOSPITAL_COMMUNITY): Payer: Self-pay | Admitting: Student in an Organized Health Care Education/Training Program

## 2022-08-10 ENCOUNTER — Ambulatory Visit (INDEPENDENT_AMBULATORY_CARE_PROVIDER_SITE_OTHER): Payer: Medicare Other | Admitting: Student in an Organized Health Care Education/Training Program

## 2022-08-10 VITALS — BP 125/77 | HR 76 | Ht 68.0 in | Wt 196.0 lb

## 2022-08-10 DIAGNOSIS — F1910 Other psychoactive substance abuse, uncomplicated: Secondary | ICD-10-CM

## 2022-08-10 DIAGNOSIS — Z8659 Personal history of other mental and behavioral disorders: Secondary | ICD-10-CM | POA: Diagnosis not present

## 2022-08-10 NOTE — Progress Notes (Signed)
Psychiatric Initial Adult Assessment   Patient Identification: Alejandro Soto MRN:  [id number] Date of Evaluation:  08/10/2022 Referral Source: Dorena Dew, FNP Chief Complaint:   Chief Complaint  Patient presents with   Establish Care   Visit Diagnosis:    ICD-10-CM   1. History of paranoid schizophrenia  Z86.59     2. Polysubstance abuse (HCC)  F19.10       History of Present Illness:  ***  Associated Signs/Symptoms: Depression Symptoms:  depressed mood, anhedonia, anxiety, (Hypo) Manic Symptoms:   Reports None Anxiety Symptoms:  Excessive Worry, Psychotic Symptoms:   Reports vague Paranoia at times PTSD Symptoms: NA  Past Psychiatric History: ***  Previous Psychotropic Medications: Yes  Risperdal, Trazodone, Buspar, Gabapentin  Substance Abuse History in the last 12 months:  Yes.    Consequences of Substance Abuse: Medical Consequences:  Hospitalized multiple times  Past Medical History:  Past Medical History:  Diagnosis Date   Insomnia 08/2019   Pneumonia    Sickle cell anemia (HCC)     Past Surgical History:  Procedure Laterality Date   NO PAST SURGERIES      Family Psychiatric History: Father- Depression, EtOH Abuse No Known Suicides.  Family History:  Family History  Problem Relation Age of Onset   Cancer Maternal Grandmother        colon    Diabetes Father    Cancer Father 63       Prostate   Asthma Brother     Social History:   Social History   Socioeconomic History   Marital status: Single    Spouse name: Not on file   Number of children: Not on file   Years of education: Not on file   Highest education level: Not on file  Occupational History   Not on file  Tobacco Use   Smoking status: Some Days    Packs/day: 0.33    Years: 5.00    Total pack years: 1.65    Types: Cigarettes   Smokeless tobacco: Never  Vaping Use   Vaping Use: Never used  Substance and Sexual Activity   Alcohol use: No    Alcohol/week: 0.0 standard  drinks of alcohol   Drug use: No   Sexual activity: Yes    Birth control/protection: Condom  Other Topics Concern   Not on file  Social History Narrative   Not on file   Social Determinants of Health   Financial Resource Strain: Not on file  Food Insecurity: Not on file  Transportation Needs: Not on file  Physical Activity: Not on file  Stress: Not on file  Social Connections: Not on file    Additional Social History: None  Allergies:   Allergies  Allergen Reactions   Amoxicillin Diarrhea    Did it involve swelling of the face/tongue/throat, SOB, or low BP? No Did it involve sudden or severe rash/hives, skin peeling, or any reaction on the inside of your mouth or nose? No Did you need to seek medical attention at a hospital or doctor's office? No When did it last happen?      childhood If all above answers are "NO", may proceed with cephalosporin use.     Metabolic Disorder Labs: Lab Results  Component Value Date   HGBA1C <4.0 09/07/2013   MPG NOT CALCULATED 09/07/2013   No results found for: "PROLACTIN" Lab Results  Component Value Date   CHOL 136 04/10/2021   TRIG 111 04/10/2021   HDL 31 (L) 04/10/2021  CHOLHDL 4.4 04/10/2021   [place] 84 04/10/2021   Lab Results  Component Value Date   TSH 1.070 07/11/2021    Therapeutic Level Labs: No results found for: "LITHIUM" No results found for: "CBMZ" No results found for: "VALPROATE"  Current Medications: Current Outpatient Medications  Medication Sig Dispense Refill   busPIRone (BUSPAR) 5 MG tablet TAKE 1 TABLET BY MOUTH TWICE A DAY (Patient not taking: Reported on 06/02/2022) 99991111 tablet 1   folic acid (FOLVITE) 1 MG tablet Take 1 tablet (1 mg total) by mouth daily. 30 tablet 11   gabapentin (NEURONTIN) 600 MG tablet Take 1 tablet (600 mg total) by mouth 2 (two) times daily. Take 1 capsule, by mouth, 2 times a day. 60 tablet 11   hydroxyurea (HYDREA) 500 MG capsule Take 3 capsules (1,500 mg total) by mouth  daily. May take with food to minimize GI side effects. 270 capsule 3   ibuprofen (ADVIL) 800 MG tablet Take 1 tablet (800 mg total) by mouth every 8 (eight) hours as needed for mild pain. 90 tablet 0   Multiple Vitamin (MULTIVITAMIN) tablet Take 2 tablets by mouth daily.      oxyCODONE-acetaminophen (PERCOCET) 10-325 MG tablet Take 1 tablet by mouth every 4 (four) hours as needed for pain. 30 tablet 0   REXULTI 1 MG TABS  (Patient not taking: Reported on 03/03/2022)     risperiDONE (RISPERDAL) 1 MG tablet TAKE 1 TABLET BY MOUTH TWICE A DAY 180 tablet 1   traZODone (DESYREL) 150 MG tablet Take 1 tablet (150 mg total) by mouth at bedtime. (Patient not taking: Reported on 06/02/2022) 30 tablet 11   triamcinolone (KENALOG) 0.025 % ointment Apply 1 application topically 2 (two) times daily. 30 g 2   Vitamin D, Ergocalciferol, (DRISDOL) 1.25 MG (50000 UNIT) CAPS capsule Take 1 capsule (50,000 Units total) by mouth every 7 (seven) days for 90 doses. 12 capsule 6   No current facility-administered medications for this visit.    Musculoskeletal: Strength & Muscle Tone: within normal limits Gait & Station: normal Patient leans: N/A  Psychiatric Specialty Exam: Review of Systems  Respiratory:  Negative for cough and shortness of breath.   Cardiovascular:  Negative for chest pain.  Gastrointestinal:  Negative for abdominal pain, constipation, diarrhea, nausea and vomiting.  Neurological:  Negative for weakness and headaches.  Psychiatric/Behavioral:  Positive for dysphoric mood. Negative for hallucinations, sleep disturbance and suicidal ideas. The patient is not nervous/anxious.     Blood pressure 125/77, pulse 76, height 5' 8"$  (1.727 m), weight 196 lb (88.9 kg), SpO2 100 %.Body mass index is 29.8 kg/m.  General Appearance: Casual and Fairly Groomed  Eye Contact:  Fair  Speech:  Clear and Coherent and Normal Rate  Volume:  Normal  Mood:   "ok"  Affect:  Appropriate and Congruent  Thought  Process:  Coherent and Goal Directed  Orientation:  Full (Time, Place, and Person)  Thought Content:  WDL and Logical no signs of responding to internal stimuli  Suicidal Thoughts:  No  Homicidal Thoughts:  No  Memory:  Immediate;   Fair Recent;   Fair  Judgement:  Fair  Insight:  Fair  Psychomotor Activity:  Normal  Concentration:  Concentration: Good and Attention Span: Good  Recall:  Good  Fund of Knowledge:Fair  Language: Good  Akathisia:  Negative  Handed:  Left  AIMS (if indicated):  done AIMS= 0  Assets:  Communication Skills Desire for Improvement Housing Resilience  ADL's:  Intact  Cognition: WNL  Sleep:  Good   Screenings: GAD-7    Flowsheet Row Office Visit from 08/10/2022 in [facility] Office Visit from 05/29/2020 in [facility] Office Visit from 11/01/2017 in [place]  Total GAD-7 Score 2 0 12      PHQ2-9    [place] Office Visit from 08/10/2022 in [facility] Office Visit from 06/02/2022 in [place] Office Visit from 03/03/2022 in [place] Office Visit from 09/08/2021 in [place] Office Visit from 07/11/2021 in [place]  PHQ-2 Total Score 1 0 0 0 0  PHQ-9 Total Score 7 -- -- -- --       Assessment and Plan:  Jalin Olmsted is a 37 yr old male who presents to [facility].  PPHx is significant for Substance Induced Thought Disorder, Polysubstance Abuse (Cocaine, Fentanyl, Opioids), Depression, Anxiety, and a history of Paranoid Schizophrenia, and ~5 Prior Psychiatric Hospitalizations (last [facility] 01/2022), and no history of Suicide Attempts or Self Injurious Behavior.   Khyle has a history of Substance Induced Thought Disorder and a history of Paranoid Schizophrenia, however, at this time he is showing no signs of Responding to Internal Stimuli, his thinking is logical  and clear, and he is not voicing any thoughts of SI or HI.  At this time we will not start any antipsychotics as it does not appear to be appropriate.  We will not start any antidepressants at this time due to not reporting having symptoms of depression or anxiety and reports he is currently in therapy.  He will return for follow-up in approximately 4 to 6 weeks.   Substance Induced Thought Disorder (not currently Psychotic) r/out Primary Thought Disorder: - Currently not endorsing any AVH or Paranoia. -Will not start any antipsychotics at this time    Collaboration of Care:   Patient/Guardian was advised Release of Information must be obtained prior to any record release in order to collaborate their care with an outside provider. Patient/Guardian was advised if they have not already done so to contact the registration department to sign all necessary forms in order for [place] to release information regarding their care.   Consent: Patient/Guardian gives verbal consent for treatment and assignment of benefits for services provided during this visit. Patient/Guardian expressed understanding and agreed to proceed.   Briant Cedar, MD 2/19/20244:52 PM

## 2022-08-11 ENCOUNTER — Other Ambulatory Visit: Payer: Self-pay

## 2022-08-11 ENCOUNTER — Encounter (HOSPITAL_COMMUNITY): Payer: Self-pay | Admitting: Student in an Organized Health Care Education/Training Program

## 2022-08-11 DIAGNOSIS — D571 Sickle-cell disease without crisis: Secondary | ICD-10-CM

## 2022-08-11 DIAGNOSIS — G894 Chronic pain syndrome: Secondary | ICD-10-CM

## 2022-08-11 MED ORDER — OXYCODONE-ACETAMINOPHEN 10-325 MG PO TABS: 1.0000 | ORAL_TABLET | ORAL | 0 refills | Status: DC | PRN

## 2022-08-11 NOTE — Telephone Encounter (Signed)
Look like pt has a new pcp . Please advise [facility]

## 2022-08-11 NOTE — Telephone Encounter (Signed)
From: [name] To: Office of Cammie Sickle, [place] Sent: 08/10/2022 4:29 PM EST Subject: Medication Renewal Request  Refills have been requested for the following medications:   oxyCODONE-acetaminophen (PERCOCET) 10-325 MG tablet [Lachina Hollis]  [id number] [place] [place] [facility] [address] [facility] [address] Delivery method: PArlyss Gandy

## 2022-08-24 ENCOUNTER — Other Ambulatory Visit: Payer: Self-pay

## 2022-08-24 ENCOUNTER — Other Ambulatory Visit: Payer: Self-pay | Admitting: Family Medicine

## 2022-08-24 DIAGNOSIS — G894 Chronic pain syndrome: Secondary | ICD-10-CM

## 2022-08-24 DIAGNOSIS — D571 Sickle-cell disease without crisis: Secondary | ICD-10-CM

## 2022-08-24 MED ORDER — OXYCODONE-ACETAMINOPHEN 10-325 MG PO TABS: 1.0000 | ORAL_TABLET | ORAL | 0 refills | Status: DC | PRN

## 2022-08-24 NOTE — Telephone Encounter (Signed)
From: [name] To: Office of Cammie Sickle, [place] Sent: 08/24/2022 12:45 PM EST Subject: Medication Renewal Request  Refills have been requested for the following medications:   oxyCODONE-acetaminophen (PERCOCET) 10-325 MG tablet [Lachina Hollis]  [id number] [place] [place] [facility] [address] [facility] [address] Delivery method: PArlyss Gandy

## 2022-08-24 NOTE — Progress Notes (Signed)
Reviewed PDMP substance reporting system prior to prescribing opiate medications. No inconsistencies noted.  Meds ordered this encounter  Medications   oxyCODONE-acetaminophen (PERCOCET) 10-325 MG tablet    Sig: Take 1 tablet by mouth every 4 (four) hours as needed for pain.    Dispense:  30 tablet    Refill:  0    Order Specific Question:   Supervising Provider    Answer:   Tresa Garter [id number]   Donia Pounds  APRN, MSN, FNP-C Patient [place] [address] [place], [place] [address] [phone]

## 2022-09-01 ENCOUNTER — Ambulatory Visit: Payer: Self-pay | Admitting: Family Medicine

## 2022-09-08 ENCOUNTER — Other Ambulatory Visit: Payer: Self-pay

## 2022-09-08 DIAGNOSIS — G894 Chronic pain syndrome: Secondary | ICD-10-CM

## 2022-09-08 DIAGNOSIS — D571 Sickle-cell disease without crisis: Secondary | ICD-10-CM

## 2022-09-08 MED ORDER — OXYCODONE-ACETAMINOPHEN 10-325 MG PO TABS: 1.0000 | ORAL_TABLET | ORAL | 0 refills | Status: DC | PRN

## 2022-09-08 NOTE — Telephone Encounter (Signed)
From: [name] To: Office of Cammie Sickle, [place] Sent: 09/07/2022 1:48 PM EDT Subject: Medication Renewal Request  Refills have been requested for the following medications:   oxyCODONE-acetaminophen (PERCOCET) 10-325 MG tablet [Lachina Hollis]  Preferred pharmacy: [id number] [place], [place] - [address], [address] AT [facility] [address] Delivery method: Arlyss Gandy

## 2022-09-08 NOTE — Telephone Encounter (Signed)
Pt was advised [facility] 

## 2022-09-15 ENCOUNTER — Ambulatory Visit: Payer: Medicare Other | Admitting: Family Medicine

## 2022-09-15 VITALS — BP 111/66 | HR 76 | Temp 97.4°F | Wt 194.0 lb

## 2022-09-15 DIAGNOSIS — E559 Vitamin D deficiency, unspecified: Secondary | ICD-10-CM | POA: Diagnosis not present

## 2022-09-15 DIAGNOSIS — D571 Sickle-cell disease without crisis: Secondary | ICD-10-CM

## 2022-09-15 DIAGNOSIS — G894 Chronic pain syndrome: Secondary | ICD-10-CM

## 2022-09-15 DIAGNOSIS — F200 Paranoid schizophrenia: Secondary | ICD-10-CM | POA: Diagnosis not present

## 2022-09-15 NOTE — Progress Notes (Signed)
Established Patient Office Visit  Subjective   Patient ID: Alejandro Soto, male    DOB: September 24, 1985  Age: 37 y.o. MRN: [id number]  Chief Complaint  Patient presents with   Sickle Cell Anemia    Alejandro Soto is a 37 year old patient with a medical history significant for sickle cell disease, chronic pain syndrome, opiate dependence and tolerance, and history of paranoid schizophrenia presents for medication management and follow-up of chronic conditions.  Patient states that he has had increased pain over the past several weeks that has been unrelieved by his home medications.  Patient has been consistently requesting Percocet 10-325 mg every 6 hours as needed for severe pain.  Patient has not been taking adjunct medications to help with pain.  He states that his chronic pain exacerbations are mostly triggered by weather.  He currently denies any fatigue, shortness of breath, or dizziness.  No urinary symptoms, nausea, vomiting, or diarrhea.  Patient endorses low back and lower extremity pain rated at 6/10.  He last had pain medication on last night with some relief.    Patient Active Problem List   Diagnosis Date Noted   Opioid dependence, uncomplicated 07/11/2021   Insomnia 03/23/2019   Chronic prescription opiate use 03/23/2019   Methadone use 08/12/2015   Chronic back pain greater than 3 months duration 04/09/2015   Abdominal pain, epigastric 12/24/2014   Sickle cell pain crisis (HCC) 12/16/2014   Thrombocytosis (HCC) 07/14/2014   Anemia 07/14/2014   Sore throat 07/03/2014   Sebaceous cyst 07/03/2014   Atopic dermatitis 06/12/2014   Leukocytosis 05/02/2014   Tobacco dependence 04/25/2014   Neck pain, bilateral 03/01/2014   Hb-SS disease without crisis (HCC) 02/22/2014   DJD (degenerative joint disease), lumbosacral 09/18/2013   Neuropathic pain 09/18/2013   Back pain, acute 08/11/2013   Opiate use 05/24/2013   Back pain 11/01/2012   Pain in joint, shoulder region 11/01/2012    Pain in joint, lower leg 11/01/2012   Vitamin D deficiency 04/21/2012   Encounter for therapeutic drug monitoring 04/21/2012   Paranoid schizophrenia 04/21/2012   Priapism 04/21/2012   Smoking 04/21/2012   Intermittent pain 01/20/2012   Sickle cell disease, type SS (HCC) 01/20/2012   Past Medical History:  Diagnosis Date   Insomnia 08/2019   Pneumonia    Sickle cell anemia    Past Surgical History:  Procedure Laterality Date   NO PAST SURGERIES     Social History   Tobacco Use   Smoking status: Some Days    Packs/day: 0.33    Years: 5.00    Additional pack years: 0.00    Total pack years: 1.65    Types: Cigarettes   Smokeless tobacco: Never  Vaping Use   Vaping Use: Never used  Substance Use Topics   Alcohol use: No    Alcohol/week: 0.0 standard drinks of alcohol   Drug use: No   Social History   Socioeconomic History   Marital status: Single    Spouse name: Not on file   Number of children: Not on file   Years of education: Not on file   Highest education level: Not on file  Occupational History   Not on file  Tobacco Use   Smoking status: Some Days    Packs/day: 0.33    Years: 5.00    Additional pack years: 0.00    Total pack years: 1.65    Types: Cigarettes   Smokeless tobacco: Never  Vaping Use   Vaping Use: Never  used  Substance and Sexual Activity   Alcohol use: No    Alcohol/week: 0.0 standard drinks of alcohol   Drug use: No   Sexual activity: Yes    Birth control/protection: Condom  Other Topics Concern   Not on file  Social History Narrative   Not on file   Social Determinants of Health   Financial Resource Strain: Low Risk  (10/01/2022)   Overall Financial Resource Strain (CARDIA)    Difficulty of Paying Living Expenses: Not hard at all  Food Insecurity: No Food Insecurity (10/01/2022)   Hunger Vital Sign    Worried About Running Out of Food in the Last Year: Never true    Ran Out of Food in the Last Year: Never true  Transportation  Needs: No Transportation Needs (10/01/2022)   PRAPARE - Administrator, Civil Service (Medical): No    Lack of Transportation (Non-Medical): No  Physical Activity: Sufficiently Active (10/01/2022)   Exercise Vital Sign    Days of Exercise per Week: 3 days    Minutes of Exercise per Session: 60 min  Stress: No Stress Concern Present (10/01/2022)   [employer] of Occupational Health - Occupational Stress Questionnaire    Feeling of Stress : Not at all  Social Connections: Moderately Integrated (10/01/2022)   Social Connection and Isolation Panel [NHANES]    Frequency of Communication with Friends and Family: More than three times a week    Frequency of Social Gatherings with Friends and Family: More than three times a week    Attends Religious Services: More than 4 times per year    Active Member of [employer] or Organizations: Yes    Attends Banker Meetings: More than 4 times per year    Marital Status: Never married  Intimate Partner Violence: Not At Risk (10/01/2022)   Humiliation, Afraid, Rape, and Kick questionnaire    Fear of Current or Ex-Partner: No    Emotionally Abused: No    Physically Abused: No    Sexually Abused: No   Family Status  Relation Name Status   MGM  Deceased   Father  (Not Specified)   Brother  (Not Specified)   Family History  Problem Relation Age of Onset   Cancer Maternal Grandmother        colon    Diabetes Father    Cancer Father 36       Prostate   Asthma Brother    Allergies  Allergen Reactions   Amoxicillin Diarrhea    Did it involve swelling of the face/tongue/throat, SOB, or low BP? No Did it involve sudden or severe rash/hives, skin peeling, or any reaction on the inside of your mouth or nose? No Did you need to seek medical attention at a hospital or doctor's office? No When did it last happen?      childhood If all above answers are "NO", may proceed with cephalosporin use.       Review of Systems   Constitutional: Negative.   HENT: Negative.    Eyes: Negative.   Respiratory: Negative.    Cardiovascular: Negative.   Gastrointestinal: Negative.   Genitourinary: Negative.   Musculoskeletal:  Positive for back pain and joint pain.  Skin: Negative.   Neurological: Negative.   Endo/Heme/Allergies: Negative.   Psychiatric/Behavioral: Negative.      Objective:     BP 111/66   Pulse 76   Temp (!) 97.4 F (36.3 C)   Wt 194 lb (88 kg)  SpO2 100%   BMI 29.50 kg/m  BP Readings from Last 3 Encounters:  09/15/22 111/66  06/02/22 (!) 105/56  03/03/22 108/64   Wt Readings from Last 3 Encounters:  10/01/22 194 lb (88 kg)  09/15/22 194 lb (88 kg)  06/02/22 194 lb 3.2 oz (88.1 kg)      Physical Exam Constitutional:      Appearance: Normal appearance.  Eyes:     Pupils: Pupils are equal, round, and reactive to light.  Cardiovascular:     Rate and Rhythm: Normal rate and regular rhythm.     Pulses: Normal pulses.  Pulmonary:     Effort: Pulmonary effort is normal.  Abdominal:     General: Bowel sounds are normal.  Musculoskeletal:        General: Normal range of motion.  Skin:    General: Skin is warm.  Neurological:     General: No focal deficit present.     Mental Status: He is alert. Mental status is at baseline.  Psychiatric:        Mood and Affect: Mood normal.        Behavior: Behavior normal.        Thought Content: Thought content normal.        Judgment: Judgment normal.      Results for orders placed or performed in visit on 09/15/22  Sickle Cell Panel  Result Value Ref Range   Glucose 101 (H) 70 - 99 mg/dL   BUN 7 6 - 20 mg/dL   Creatinine, Ser 7.82 (L) 0.76 - 1.27 mg/dL   eGFR 956 >21 HY/QMV/7.84   BUN/Creatinine Ratio 10 9 - 20   Sodium 140 134 - 144 mmol/L   Potassium 4.6 3.5 - 5.2 mmol/L   Chloride 105 96 - 106 mmol/L   CO2 20 20 - 29 mmol/L   Calcium 9.4 8.7 - 10.2 mg/dL   Total Protein 7.1 6.0 - 8.5 g/dL   Albumin 4.4 4.1 - 5.1 g/dL    Globulin, Total 2.7 1.5 - 4.5 g/dL   Albumin/Globulin Ratio 1.6 1.2 - 2.2   Bilirubin Total 0.7 0.0 - 1.2 mg/dL   Alkaline Phosphatase 58 44 - 121 IU/L   AST 26 0 - 40 IU/L   ALT 20 0 - 44 IU/L   Ferritin 414 (H) 30 - 400 ng/mL   Vit D, 25-Hydroxy 57.2 30.0 - 100.0 ng/mL   WBC 9.7 3.4 - 10.8 x10E3/uL   RBC 2.84 (L) 4.14 - 5.80 x10E6/uL   Hemoglobin 10.2 (L) 13.0 - 17.7 g/dL   Hematocrit 69.6 (L) 29.5 - 51.0 %   MCV 103 (H) 79 - 97 fL   MCH 35.9 (H) 26.6 - 33.0 pg   MCHC 34.9 31.5 - 35.7 g/dL   RDW 28.4 (H) 13.2 - 44.0 %   Platelets 761 (H) 150 - 450 x10E3/uL   Neutrophils 49 Not Estab. %   Lymphs 44 Not Estab. %   Monocytes 6 Not Estab. %   Eos 1 Not Estab. %   Basos 0 Not Estab. %   Neutrophils Absolute 4.7 1.4 - 7.0 x10E3/uL   Lymphocytes Absolute 4.3 (H) 0.7 - 3.1 x10E3/uL   Monocytes Absolute 0.6 0.1 - 0.9 x10E3/uL   EOS (ABSOLUTE) 0.1 0.0 - 0.4 x10E3/uL   Basophils Absolute 0.0 0.0 - 0.2 x10E3/uL   Immature Granulocytes 0 Not Estab. %   Immature Grans (Abs) 0.0 0.0 - 0.1 x10E3/uL   NRBC 4 (H) 0 - 0 %  Retic Ct Pct 4.5 (H) 0.6 - 2.6 %  [id number] 11+Oxyco+Alc+Crt-Bund  Result Value Ref Range   Ethanol Negative Cutoff=0.020 %   Amphetamines, Urine Negative Cutoff=1000 ng/mL   Barbiturate Negative Cutoff=200 ng/mL   BENZODIAZ UR QL See Final Results Cutoff=200 ng/mL   Cannabinoid Quant, Ur Negative Cutoff=50 ng/mL   Cocaine (Metabolite) Negative Cutoff=300 ng/mL   OPIATE SCREEN URINE See Final Results Cutoff=300 ng/mL   Oxycodone/Oxymorphone, Urine See Final Results Cutoff=300 ng/mL   Phencyclidine Negative Cutoff=25 ng/mL   Methadone Screen, Urine Negative Cutoff=300 ng/mL   Propoxyphene Negative Cutoff=300 ng/mL   Meperidine Negative Cutoff=200 ng/mL   Tramadol Negative Cutoff=200 ng/mL   Creatinine 129.9 20.0 - 300.0 mg/dL   pH, Urine 5.3 4.5 - 8.9  Benzodiazepines Confirm, Urine  Result Value Ref Range   Benzodiazepines Positive (A) Cutoff=200 ng/mL    Nordiazepam Positive (A)    Nordiazepam Conf 465 Cutoff=100 ng/mL   Oxazepam Positive (A)    Oxazepam Conf 549 Cutoff=100 ng/mL   Flurazepam Negative Cutoff=100   Lorazepam Negative Cutoff=100   Alprazolam Negative Cutoff=100   Clonazepam Negative Cutoff=100   Temazepam Positive (A)    Temazepam Conf. 607 Cutoff=100 ng/mL   Triazolam Negative Cutoff=100   Midazolam Negative Cutoff=100  Opiates Confirmation, Urine  Result Value Ref Range   Opiates Negative Cutoff=300 ng/mL  Oxycodone/Oxymorphone, Confirm  Result Value Ref Range   OXYCODONE/OXYMORPH Positive (A) Cutoff=300   OXYCODONE Positive (A)    OXYCODONE >3000 Cutoff=300 ng/mL   OXYMORPHONE Positive (A)    OXYMORPHONE (GC/MS) >3000 Cutoff=300 ng/mL    Last CBC Lab Results  Component Value Date   WBC 9.7 09/15/2022   HGB 10.2 (L) 09/15/2022   HCT 29.2 (L) 09/15/2022   MCV 103 (H) 09/15/2022   MCH 35.9 (H) 09/15/2022   RDW 17.0 (H) 09/15/2022   PLT 761 (H) 09/15/2022   Last metabolic panel Lab Results  Component Value Date   GLUCOSE 101 (H) 09/15/2022   NA 140 09/15/2022   K 4.6 09/15/2022   CL 105 09/15/2022   CO2 20 09/15/2022   BUN 7 09/15/2022   CREATININE 0.71 (L) 09/15/2022   EGFR 122 09/15/2022   CALCIUM 9.4 09/15/2022   PROT 7.1 09/15/2022   ALBUMIN 4.4 09/15/2022   LABGLOB 2.7 09/15/2022   AGRATIO 1.6 09/15/2022   BILITOT 0.7 09/15/2022   ALKPHOS 58 09/15/2022   AST 26 09/15/2022   ALT 20 09/15/2022   ANIONGAP 10 07/31/2020   Last lipids Lab Results  Component Value Date   CHOL 136 04/10/2021   HDL 31 (L) 04/10/2021   LDLCALC 84 04/10/2021   TRIG 111 04/10/2021   CHOLHDL 4.4 04/10/2021   Last hemoglobin A1c Lab Results  Component Value Date   HGBA1C <4.0 09/07/2013   Last thyroid functions Lab Results  Component Value Date   TSH 1.070 07/11/2021   Last vitamin D Lab Results  Component Value Date   VD25OH 57.2 09/15/2022   Last vitamin B12 and Folate Lab Results  Component  Value Date   FOLATE 10.0 12/16/2021      The ASCVD Risk score (Arnett DK, et al., 2019) failed to calculate for the following reasons:   The 2019 ASCVD risk score is only valid for ages 87 to 17    Assessment & Plan:   Problem List Items Addressed This Visit       Other   Vitamin D deficiency - Primary   Relevant Orders   Sickle Cell Panel (Completed)  Paranoid schizophrenia   Hb-SS disease without crisis (HCC)   Relevant Orders   Sickle Cell Panel (Completed)   Other Visit Diagnoses     Chronic pain syndrome       Relevant Orders   [id number] 11+Oxyco+Alc+Crt-Bund (Completed)     1. Vitamin D deficiency  - Sickle Cell Panel  2. Hb-SS disease without crisis (HCC) Folic acid and hydroxyurea.  No changes in dosage. Recommend eye exam yearly. Also, recommended regular dental follow-up. No changes to pain medication regimen on today. - Sickle Cell Panel  3. Chronic pain syndrome Patient on chronic opiate therapy.  Medication adherence agreement in place. - [id number] 11+Oxyco+Alc+Crt-Bund  4. Paranoid schizophrenia [facility]) Follow-up with psychiatry as scheduled.  Follow-up in 3 months for medication management  Nolon Nations  APRN, MSN, FNP-C Patient Care [facility] [address] [place], [place] [address] [phone]

## 2022-09-16 LAB — CMP14+CBC/D/PLT+FER+RETIC+V...
ALT: 20 IU/L (ref 0–44)
AST: 26 IU/L (ref 0–40)
Albumin/Globulin Ratio: 1.6 (ref 1.2–2.2)
Albumin: 4.4 g/dL (ref 4.1–5.1)
Alkaline Phosphatase: 58 IU/L (ref 44–121)
BUN/Creatinine Ratio: 10 (ref 9–20)
BUN: 7 mg/dL (ref 6–20)
Basophils Absolute: 0 10*3/uL (ref 0.0–0.2)
Basos: 0 %
Bilirubin Total: 0.7 mg/dL (ref 0.0–1.2)
CO2: 20 mmol/L (ref 20–29)
Calcium: 9.4 mg/dL (ref 8.7–10.2)
Chloride: 105 mmol/L (ref 96–106)
Creatinine, Ser: 0.71 mg/dL — ABNORMAL LOW (ref 0.76–1.27)
EOS (ABSOLUTE): 0.1 10*3/uL (ref 0.0–0.4)
Eos: 1 %
Ferritin: 414 ng/mL — ABNORMAL HIGH (ref 30–400)
Globulin, Total: 2.7 g/dL (ref 1.5–4.5)
Glucose: 101 mg/dL — ABNORMAL HIGH (ref 70–99)
Hematocrit: 29.2 % — ABNORMAL LOW (ref 37.5–51.0)
Hemoglobin: 10.2 g/dL — ABNORMAL LOW (ref 13.0–17.7)
Immature Grans (Abs): 0 10*3/uL (ref 0.0–0.1)
Immature Granulocytes: 0 %
Lymphocytes Absolute: 4.3 10*3/uL — ABNORMAL HIGH (ref 0.7–3.1)
Lymphs: 44 %
MCH: 35.9 pg — ABNORMAL HIGH (ref 26.6–33.0)
MCHC: 34.9 g/dL (ref 31.5–35.7)
MCV: 103 fL — ABNORMAL HIGH (ref 79–97)
Monocytes Absolute: 0.6 10*3/uL (ref 0.1–0.9)
Monocytes: 6 %
NRBC: 4 % — ABNORMAL HIGH (ref 0–0)
Neutrophils Absolute: 4.7 10*3/uL (ref 1.4–7.0)
Neutrophils: 49 %
Platelets: 761 10*3/uL — ABNORMAL HIGH (ref 150–450)
Potassium: 4.6 mmol/L (ref 3.5–5.2)
RBC: 2.84 x10E6/uL — ABNORMAL LOW (ref 4.14–5.80)
RDW: 17 % — ABNORMAL HIGH (ref 11.6–15.4)
Retic Ct Pct: 4.5 % — ABNORMAL HIGH (ref 0.6–2.6)
Sodium: 140 mmol/L (ref 134–144)
Total Protein: 7.1 g/dL (ref 6.0–8.5)
Vit D, 25-Hydroxy: 57.2 ng/mL (ref 30.0–100.0)
WBC: 9.7 10*3/uL (ref 3.4–10.8)
eGFR: 122 mL/min/{1.73_m2} (ref >59)

## 2022-09-17 LAB — DRUG SCREEN 764883 11+OXYCO+ALC+CRT-BUND

## 2022-09-18 ENCOUNTER — Other Ambulatory Visit: Payer: Self-pay

## 2022-09-18 DIAGNOSIS — G894 Chronic pain syndrome: Secondary | ICD-10-CM

## 2022-09-18 DIAGNOSIS — D571 Sickle-cell disease without crisis: Secondary | ICD-10-CM

## 2022-09-18 NOTE — Telephone Encounter (Signed)
From: [name] To: Office of Cammie Sickle, [place] Sent: 09/18/2022 10:56 AM EDT Subject: Medication Renewal Request  Refills have been requested for the following medications:   oxyCODONE-acetaminophen (PERCOCET) 10-325 MG tablet [Lachina Hollis]  [id number] [place], [place] - [address], [address] AT [facility] Delivery method: Arlyss Gandy

## 2022-09-20 LAB — OXYCODONE/OXYMORPHONE, CONFIRM
OXYCODONE/OXYMORPH: POSITIVE — AB
OXYCODONE: >3000 ng/mL
OXYCODONE: POSITIVE — AB
OXYMORPHONE (GC/MS): >3000 ng/mL
OXYMORPHONE: POSITIVE — AB

## 2022-09-20 LAB — OPIATES CONFIRMATION, URINE: Opiates: NEGATIVE ng/mL

## 2022-09-20 LAB — DRUG SCREEN 764883 11+OXYCO+ALC+CRT-BUND
Amphetamines, Urine: NEGATIVE ng/mL
Barbiturate: NEGATIVE ng/mL
Cannabinoid Quant, Ur: NEGATIVE ng/mL
Cocaine (Metabolite): NEGATIVE ng/mL
Creatinine: 129.9 mg/dL (ref 20.0–300.0)
Ethanol: NEGATIVE %
Meperidine: NEGATIVE ng/mL
Methadone Screen, Urine: NEGATIVE ng/mL
Phencyclidine: NEGATIVE ng/mL
Propoxyphene: NEGATIVE ng/mL
Tramadol: NEGATIVE ng/mL
pH, Urine: 5.3 (ref 4.5–8.9)

## 2022-09-20 LAB — BENZODIAZEPINES CONFIRM, URINE
Alprazolam: NEGATIVE
Benzodiazepines: POSITIVE ng/mL — AB
Clonazepam: NEGATIVE
Flurazepam: NEGATIVE
Lorazepam: NEGATIVE
Midazolam: NEGATIVE
Nordiazepam Conf: 465 ng/mL
Nordiazepam: POSITIVE — AB
Oxazepam Conf: 549 ng/mL
Oxazepam: POSITIVE — AB
Temazepam Conf.: 607 ng/mL
Temazepam: POSITIVE — AB
Triazolam: NEGATIVE

## 2022-09-21 ENCOUNTER — Encounter (HOSPITAL_COMMUNITY): Payer: Medicaid Other | Admitting: Student in an Organized Health Care Education/Training Program

## 2022-09-23 ENCOUNTER — Other Ambulatory Visit: Payer: Self-pay

## 2022-09-23 DIAGNOSIS — G894 Chronic pain syndrome: Secondary | ICD-10-CM

## 2022-09-23 DIAGNOSIS — Z79891 Long term (current) use of opiate analgesic: Secondary | ICD-10-CM

## 2022-09-23 DIAGNOSIS — F1120 Opioid dependence, uncomplicated: Secondary | ICD-10-CM

## 2022-09-25 ENCOUNTER — Telehealth: Payer: Self-pay | Admitting: Family Medicine

## 2022-09-25 NOTE — Telephone Encounter (Signed)
Contacted Michaele Offer to schedule their annual wellness visit. Appointment made for 10/01/2022.  Thank you,  [facility] Support [facility] Direct dial  [phone]

## 2022-09-28 ENCOUNTER — Encounter (HOSPITAL_COMMUNITY): Payer: Self-pay

## 2022-09-28 ENCOUNTER — Telehealth (HOSPITAL_COMMUNITY): Payer: Medicare Other | Admitting: Student in an Organized Health Care Education/Training Program

## 2022-09-29 ENCOUNTER — Other Ambulatory Visit: Payer: Medicare Other

## 2022-10-01 ENCOUNTER — Ambulatory Visit (INDEPENDENT_AMBULATORY_CARE_PROVIDER_SITE_OTHER): Payer: Medicare Other

## 2022-10-01 VITALS — Ht 68.0 in | Wt 194.0 lb

## 2022-10-01 DIAGNOSIS — Z0000 Encounter for general adult medical examination without abnormal findings: Secondary | ICD-10-CM

## 2022-10-01 NOTE — Patient Instructions (Addendum)
[name] , Thank you for taking time to come for your Medicare Wellness Visit. I appreciate your ongoing commitment to your health goals. Please review the following plan we discussed and let me know if I can assist you in the future.   These are the goals we discussed:  Goals       Quit Smoking (pt-stated)        This is a list of the screening recommended for you and due dates:  Health Maintenance  Topic Date Due   COVID-19 Vaccine (1) 06/19/2023*   Flu Shot  01/21/2023   Medicare Annual Wellness Visit  10/01/2023   DTaP/Tdap/Td vaccine (2 - Td or Tdap) 08/14/2026   HIV Screening  Completed   HPV Vaccine  Aged Out   Hepatitis C Screening: USPSTF Recommendation to screen - Ages 7-79 yo.  Discontinued  *Topic was postponed. The date shown is not the original due date.  Opioid Pain Medicine Management Opioids are powerful medicines that are used to treat moderate to severe pain. When used for short periods of time, they can help you to: Sleep better. Do better in physical or occupational therapy. Feel better in the first few days after an injury. Recover from surgery. Opioids should be taken with the supervision of a trained health care provider. They should be taken for the shortest period of time possible. This is because opioids can be addictive, and the longer you take opioids, the greater your risk of addiction. This addiction can also be called opioid use disorder. What are the risks? Using opioid pain medicines for longer than 3 days increases your risk of side effects. Side effects include: Constipation. Nausea and vomiting. Breathing difficulties (respiratory depression). Drowsiness. Confusion. Opioid use disorder. Itching. Taking opioid pain medicine for a long period of time can affect your ability to do daily tasks. It also puts you at risk for: Motor vehicle crashes. Depression. Suicide. Heart attack. Overdose, which can be life-threatening. What is a pain  treatment plan? A pain treatment plan is an agreement between you and your health care provider. Pain is unique to each person, and treatments vary depending on your condition. To manage your pain, you and your health care provider need to work together. To help you do this: Discuss the goals of your treatment, including how much pain you might expect to have and how you will manage the pain. Review the risks and benefits of taking opioid medicines. Remember that a good treatment plan uses more than one approach and minimizes the chance of side effects. Be honest about the amount of medicines you take and about any drug or alcohol use. Get pain medicine prescriptions from only one health care provider. Pain can be managed with many types of alternative treatments. Ask your health care provider to refer you to one or more specialists who can help you manage pain through: Physical or occupational therapy. Counseling (cognitive behavioral therapy). Good nutrition. Biofeedback. Massage. Meditation. Non-opioid medicine. Following a gentle exercise program. How to use opioid pain medicine Taking medicine Take your pain medicine exactly as told by your health care provider. Take it only when you need it. If your pain gets less severe, you may take less than your prescribed dose if your health care provider approves. If you are not having pain, do nottake pain medicine unless your health care provider tells you to take it. If your pain is severe, do nottry to treat it yourself by taking more pills than instructed on  your prescription. Contact your health care provider for help. Write down the times when you take your pain medicine. It is easy to become confused while on pain medicine. Writing the time can help you avoid overdose. Take other over-the-counter or prescription medicines only as told by your health care provider. Keeping yourself and others safe  While you are taking opioid pain  medicine: Do not drive, use machinery, or power tools. Do not sign legal documents. Do not drink alcohol. Do not take sleeping pills. Do not supervise children by yourself. Do not do activities that require climbing or being in high places. Do not go to a lake, river, ocean, spa, or swimming pool. Do not share your pain medicine with anyone. Keep pain medicine in a locked cabinet or in a secure area where pets and children cannot reach it. Stopping your use of opioids If you have been taking opioid medicine for more than a few weeks, you may need to slowly decrease (taper) how much you take until you stop completely. Tapering your use of opioids can decrease your risk of symptoms of withdrawal, such as: Pain and cramping in the abdomen. Nausea. Sweating. Sleepiness. Restlessness. Uncontrollable shaking (tremors). Cravings for the medicine. Do not attempt to taper your use of opioids on your own. Talk with your health care provider about how to do this. Your health care provider may prescribe a step-down schedule based on how much medicine you are taking and how long you have been taking it. Getting rid of leftover pills Do not save any leftover pills. Get rid of leftover pills safely by: Taking the medicine to a prescription take-back program. This is usually offered by the county or law enforcement. Bringing them to a pharmacy that has a drug disposal container. Flushing them down the toilet. Check the label or package insert of your medicine to see whether this is safe to do. Throwing them out in the trash. Check the label or package insert of your medicine to see whether this is safe to do. If it is safe to throw it out, remove the medicine from the original container, put it into a sealable bag or container, and mix it with used coffee grounds, food scraps, dirt, or cat litter before putting it in the trash. Follow these instructions at home: Activity Do exercises as told by your  health care provider. Avoid activities that make your pain worse. Return to your normal activities as told by your health care provider. Ask your health care provider what activities are safe for you. General instructions You may need to take these actions to prevent or treat constipation: Drink enough fluid to keep your urine pale yellow. Take over-the-counter or prescription medicines. Eat foods that are high in fiber, such as beans, whole grains, and fresh fruits and vegetables. Limit foods that are high in fat and processed sugars, such as fried or sweet foods. Keep all follow-up visits. This is important. Where to find support If you have been taking opioids for a long time, you may benefit from receiving support for quitting from a local support group or counselor. Ask your health care provider for a referral to these resources in your area. Where to find more information Centers for Disease Control and Prevention (CDC): FootballExhibition.com.br [place] Food and Drug Administration (FDA): PumpkinSearch.com.ee Get help right away if: You may have taken too much of an opioid (overdosed). Common symptoms of an overdose: Your breathing is slower or more shallow than normal. You have a  very slow heartbeat (pulse). You have slurred speech. You have nausea and vomiting. Your pupils become very small. You have other potential symptoms: You are very confused. You faint or feel like you will faint. You have cold, clammy skin. You have blue lips or fingernails. You have thoughts of harming yourself or harming others. These symptoms may represent a serious problem that is an emergency. Do not wait to see if the symptoms will go away. Get medical help right away. Call your local emergency services (911 in the [place] Do not drive yourself to the hospital.  If you ever feel like you may hurt yourself or others, or have thoughts about taking your own life, get help right away. Go to your nearest emergency department  or: Call your local emergency services (911 in the [place] Call the [facility] ((856)178-55101-845-532-5206 in the [place] Call a suicide crisis helpline, such as the National Suicide Prevention Lifeline at 915-520-00091-(213) 118-5728 or 988 in the [place] This is open 24 hours a day in the [place] Text the Crisis Text Line at 406-192-5923741741 (in the [place] Summary Opioid medicines can help you manage moderate to severe pain for a short period of time. A pain treatment plan is an agreement between you and your health care provider. Discuss the goals of your treatment, including how much pain you might expect to have and how you will manage the pain. If you think that you or someone else may have taken too much of an opioid, get medical help right away. This information is not intended to replace advice given to you by your health care provider. Make sure you discuss any questions you have with your health care provider. Document Revised: 01/01/2021 Document Reviewed: 09/18/2020 Elsevier Patient Education  2023 [employer]   Advanced directives: Advance directive discussed with you today. Even though you declined this today, please call our office should you change your mind, and we can give you the proper paperwork for you to fill out.   Conditions/risks identified: None  Next appointment: Follow up in one year for your annual wellness visit   Preventive Care 7721-37 Years Old, Male Preventive care refers to lifestyle choices and visits with your health care provider that can promote health and wellness. Preventive care visits are also called wellness exams. What can I expect for my preventive care visit? Counseling During your preventive care visit, your health care provider may ask about your: Medical history, including: Past medical problems. Family medical history. Current health, including: Emotional well-being. Home life and relationship well-being. Sexual activity. Lifestyle, including: Alcohol,  nicotine or tobacco, and drug use. Access to firearms. Diet, exercise, and sleep habits. Safety issues such as seatbelt and bike helmet use. Sunscreen use. Work and work Astronome[employer]vironment. Physical exam Your health care provider may check your: Height and weight. These may be used to calculate your BMI (body mass index). BMI is a measurement that tells if you are at a healthy weight. Waist circumference. This measures the distance around your waistline. This measurement also tells if you are at a healthy weight and may help predict your risk of certain diseases, such as type 2 diabetes and high blood pressure. Heart rate and blood pressure. Body temperature. Skin for abnormal spots. What immunizations do I need? Vaccines are usually given at various ages, according to a schedule. Your health care provider will recommend vaccines for you based on your age, medical history, and lifestyle or other factors, such as travel or where you work.  What tests do I need? Screening Your health care provider may recommend screening tests for certain conditions. This may include: Lipid and cholesterol levels. Diabetes screening. This is done by checking your blood sugar (glucose) after you have not eaten for a while (fasting). Hepatitis B test. Hepatitis C test. HIV (human immunodeficiency virus) test. STI (sexually transmitted infection) testing, if you are at risk. Talk with your health care provider about your test results, treatment options, and if necessary, the need for more tests. Follow these instructions at home: Eating and drinking  Eat a healthy diet that includes fresh fruits and vegetables, whole grains, lean protein, and low-fat dairy products. Drink enough fluid to keep your urine pale yellow. Take vitamin and mineral supplements as recommended by your health care provider. Do not drink alcohol if your health care provider tells you not to drink. If you drink alcohol: Limit how much you  have to 0-2 drinks a day. Know how much alcohol is in your drink. In the [place] one drink equals one 12 oz bottle of beer (355 mL), one 5 oz glass of wine (148 mL), or one 1 oz glass of hard liquor (44 mL). Lifestyle Brush your teeth every morning and night with fluoride toothpaste. Floss one time each day. Exercise for at least 30 minutes 5 or more days each week. Do not use any products that contain nicotine or tobacco. These products include cigarettes, chewing tobacco, and vaping devices, such as e-cigarettes. If you need help quitting, ask your health care provider. Do not use drugs. If you are sexually active, practice safe sex. Use a condom or other form of protection to prevent STIs. Find healthy ways to manage stress, such as: Meditation, yoga, or listening to music. Journaling. Talking to a trusted person. Spending time with friends and family. Minimize exposure to UV radiation to reduce your risk of skin cancer. Safety Always wear your seat belt while driving or riding in a vehicle. Do not drive: If you have been drinking alcohol. Do not ride with someone who has been drinking. If you have been using any mind-altering substances or drugs. While texting. When you are tired or distracted. Wear a helmet and other protective equipment during sports activities. If you have firearms in your house, make sure you follow all gun safety procedures. Seek help if you have been physically or sexually abused. What's next? Go to your health care provider once a year for an annual wellness visit. Ask your health care provider how often you should have your eyes and teeth checked. Stay up to date on all vaccines. This information is not intended to replace advice given to you by your health care provider. Make sure you discuss any questions you have with your health care provider. Document Revised: 12/04/2020 Document Reviewed: 12/04/2020 Elsevier Patient Education  2022 [employer].

## 2022-10-01 NOTE — Progress Notes (Signed)
Subjective:   Alejandro Soto is a 37 y.o. male who presents for Medicare Annual/Subsequent preventive examination.  Review of Systems    Virtual Visit via Telephone Note  I connected with  Alejandro Soto on 10/01/22 at 11:30 AM EDT by telephone and verified that I am speaking with the correct person using two identifiers.  Location: Patient: Home Provider: [facility] Persons participating in the virtual visit: patient/Nurse Health Advisor   I discussed the limitations, risks, security and privacy concerns of performing an evaluation and management service by telephone and the availability of in person appointments. The patient expressed understanding and agreed to proceed.  Interactive audio and video telecommunications were attempted between this nurse and patient, however failed, due to patient having technical difficulties OR patient did not have access to video capability.  We continued and completed visit with audio only.  Some vital signs may be absent or patient reported.   Tillie RungBeverly W Christyne Mccain, LPN  Cardiac Risk Factors include: advanced age (>8755men, 77>65 women);male gender;smoking/ tobacco exposure     Objective:    Today's Vitals   10/01/22 1124  Weight: 194 lb (88 kg)  Height: 5\' 8"  (1.727 m)   Body mass index is 29.5 kg/m.     10/01/2022   11:34 AM 07/31/2020   12:22 PM 03/28/2020    1:30 PM 09/11/2016   11:05 AM 07/14/2016   12:04 PM 07/14/2016   11:31 AM 05/05/2016    8:37 AM  Advanced Directives  Does Patient Have a Medical Advance Directive? No No No No No No No  Would patient like information on creating a medical advance directive? No - Patient declined  No - Patient declined  No - Patient declined  No - patient declined information    Current Medications (verified) Outpatient Encounter Medications as of 10/01/2022  Medication Sig   busPIRone (BUSPAR) 5 MG tablet TAKE 1 TABLET BY MOUTH TWICE A DAY (Patient not taking: Reported on 06/02/2022)   folic acid (FOLVITE)  1 MG tablet Take 1 tablet (1 mg total) by mouth daily.   gabapentin (NEURONTIN) 600 MG tablet Take 1 tablet (600 mg total) by mouth 2 (two) times daily. Take 1 capsule, by mouth, 2 times a day. (Patient not taking: Reported on 09/15/2022)   hydroxyurea (HYDREA) 500 MG capsule Take 3 capsules (1,500 mg total) by mouth daily. May take with food to minimize GI side effects.   ibuprofen (ADVIL) 800 MG tablet Take 1 tablet (800 mg total) by mouth every 8 (eight) hours as needed for mild pain.   Multiple Vitamin (MULTIVITAMIN) tablet Take 2 tablets by mouth daily.    oxyCODONE-acetaminophen (PERCOCET) 10-325 MG tablet Take 1 tablet by mouth every 4 (four) hours as needed for pain.   REXULTI 1 MG TABS  (Patient not taking: Reported on 03/03/2022)   risperiDONE (RISPERDAL) 1 MG tablet TAKE 1 TABLET BY MOUTH TWICE A DAY (Patient not taking: Reported on 09/15/2022)   triamcinolone (KENALOG) 0.025 % ointment Apply 1 application topically 2 (two) times daily.   Vitamin D, Ergocalciferol, (DRISDOL) 1.25 MG (50000 UNIT) CAPS capsule Take 1 capsule (50,000 Units total) by mouth every 7 (seven) days for 90 doses.   No facility-administered encounter medications on file as of 10/01/2022.    Allergies (verified) Amoxicillin   History: Past Medical History:  Diagnosis Date   Insomnia 08/2019   Pneumonia    Sickle cell anemia    Past Surgical History:  Procedure Laterality Date   NO PAST SURGERIES  Family History  Problem Relation Age of Onset   Cancer Maternal Grandmother        colon    Diabetes Father    Cancer Father 18       Prostate   Asthma Brother    Social History   Socioeconomic History   Marital status: Single    Spouse name: Not on file   Number of children: Not on file   Years of education: Not on file   Highest education level: Not on file  Occupational History   Not on file  Tobacco Use   Smoking status: Some Days    Packs/day: 0.33    Years: 5.00    Additional pack  years: 0.00    Total pack years: 1.65    Types: Cigarettes   Smokeless tobacco: Never  Vaping Use   Vaping Use: Never used  Substance and Sexual Activity   Alcohol use: No    Alcohol/week: 0.0 standard drinks of alcohol   Drug use: No   Sexual activity: Yes    Birth control/protection: Condom  Other Topics Concern   Not on file  Social History Narrative   Not on file   Social Determinants of Health   Financial Resource Strain: Low Risk  (10/01/2022)   Overall Financial Resource Strain (CARDIA)    Difficulty of Paying Living Expenses: Not hard at all  Food Insecurity: No Food Insecurity (10/01/2022)   Hunger Vital Sign    Worried About Running Out of Food in the Last Year: Never true    Ran Out of Food in the Last Year: Never true  Transportation Needs: No Transportation Needs (10/01/2022)   PRAPARE - Administrator, Civil Service (Medical): No    Lack of Transportation (Non-Medical): No  Physical Activity: Sufficiently Active (10/01/2022)   Exercise Vital Sign    Days of Exercise per Week: 3 days    Minutes of Exercise per Session: 60 min  Stress: No Stress Concern Present (10/01/2022)   [employer] of Occupational Health - Occupational Stress Questionnaire    Feeling of Stress : Not at all  Social Connections: Moderately Integrated (10/01/2022)   Social Connection and Isolation Panel [NHANES]    Frequency of Communication with Friends and Family: More than three times a week    Frequency of Social Gatherings with Friends and Family: More than three times a week    Attends Religious Services: More than 4 times per year    Active Member of [employer] or Organizations: Yes    Attends Engineer, structural: More than 4 times per year    Marital Status: Never married    Tobacco Counseling Ready to quit: Yes Counseling given: Yes   Clinical Intake:  Pre-visit preparation completed: Yes  Pain : No/denies pain     BMI - recorded: 29.5 Nutritional  Status: BMI 25 -29 Overweight Nutritional Risks: None Diabetes: No  How often do you need to have someone help you when you read instructions, pamphlets, or other written materials from your doctor or pharmacy?: 1 - Never  Diabetic? No  Interpreter Needed?: No  Information entered by :: Theresa Mulligan LPN   Activities of Daily Living    10/01/2022   11:32 AM 09/28/2022    8:54 AM  In your present state of health, do you have any difficulty performing the following activities:  Hearing? 0 0  Vision? 0 1  Difficulty concentrating or making decisions? 0 0  Walking or climbing stairs?  0 0  Dressing or bathing? 0 0  Doing errands, shopping? 0 0  Preparing Food and eating ? N N  Using the Toilet? N N  In the past six months, have you accidently leaked urine? N N  Do you have problems with loss of bowel control? N N  Managing your Medications? N N  Managing your Finances? N N  Housekeeping or managing your Housekeeping? N N    Patient Care Team: Massie Maroon, FNP as PCP - General (Family Medicine)  Indicate any recent Medical Services you may have received from other than [facility] providers in the past year (date may be approximate).     Assessment:   This is a routine wellness examination for [facility].  Hearing/Vision screen Hearing Screening - Comments:: Denies hearing difficulties   Vision Screening - Comments:: Wears rx glasses - up to date with routine eye exams with  Deferred Pending appt   Dietary issues and exercise activities discussed: Current Exercise Habits: Home exercise routine, Type of exercise: walking, Time (Minutes): 30, Frequency (Times/Week): 3, Weekly Exercise (Minutes/Week): 90, Intensity: Moderate, Exercise limited by: None identified   Goals Addressed               This Visit's Progress     Quit Smoking (pt-stated)         Depression Screen    10/01/2022   11:30 AM 09/15/2022   11:46 AM 08/10/2022    3:19 PM 06/02/2022   10:53 AM 03/03/2022    10:35 AM 09/08/2021   10:34 AM 07/11/2021   11:15 AM  PHQ 2/9 Scores  PHQ - 2 Score 0 0  0 0 0 0  PHQ- 9 Score            Information is confidential and restricted. Go to Review Flowsheets to unlock data.    Fall Risk    10/01/2022   11:33 AM 09/28/2022    8:54 AM 03/03/2022   10:35 AM 09/08/2021   10:34 AM 07/11/2021   11:14 AM  Fall Risk   Falls in the past year? 0 0 0 0 0  Number falls in past yr: 0 0 0 0 0  Injury with Fall? 0  0 0 0  Risk for fall due to : No Fall Risks  No Fall Risks No Fall Risks   Follow up Falls prevention discussed  Falls evaluation completed Falls evaluation completed     FALL RISK PREVENTION PERTAINING TO THE HOME:  Any stairs in or around the home? Yes  If so, are there any without handrails? No  Home free of loose throw rugs in walkways, pet beds, electrical cords, etc? Yes  Adequate lighting in your home to reduce risk of falls? Yes   ASSISTIVE DEVICES UTILIZED TO PREVENT FALLS:  Life alert? No  Use of a cane, walker or w/c? No  Grab bars in the bathroom? No  Shower chair or bench in shower? No  Elevated toilet seat or a handicapped toilet? No   TIMED UP AND GO:  Was the test performed? No . Audio Visit   Cognitive Function:        10/01/2022   11:35 AM  6CIT Screen  What Year? 0 points  What month? 0 points  What time? 0 points  Count back from 20 0 points  Months in reverse 0 points  Repeat phrase 0 points  Total Score 0 points    Immunizations Immunization History  Administered Date(s) Administered  Influenza Whole 03/21/2012   Influenza,inj,Quad PF,6+ Mos 03/21/2013, 03/01/2014   Pneumococcal Conjugate-13 03/21/2012   Pneumococcal Polysaccharide-23 07/15/2014   Tdap 08/14/2016    TDAP status: Up to date  Flu Vaccine status: Up to date    Covid-19 vaccine status: Declined, Education has been provided regarding the importance of this vaccine but patient still declined. Advised may receive this vaccine at local  pharmacy or Health Dept.or vaccine clinic. Aware to provide a copy of the vaccination record if obtained from local pharmacy or Health Dept. Verbalized acceptance and understanding.  Qualifies for Shingles Vaccine? No   Zostavax completed No   Shingrix Completed?: No.    Education has been provided regarding the importance of this vaccine. Patient has been advised to call insurance company to determine out of pocket expense if they have not yet received this vaccine. Advised may also receive vaccine at local pharmacy or Health Dept. Verbalized acceptance and understanding.  Screening Tests Health Maintenance  Topic Date Due   COVID-19 Vaccine (1) 06/19/2023 (Originally 09/11/1986)   INFLUENZA VACCINE  01/21/2023   Medicare Annual Wellness (AWV)  10/01/2023   DTaP/Tdap/Td (2 - Td or Tdap) 08/14/2026   HIV Screening  Completed   HPV VACCINES  Aged Out   Hepatitis C Screening  Discontinued    Health Maintenance  There are no preventive care reminders to display for this patient.     Lung Cancer Screening: (Low Dose CT Chest recommended if Age 107-80 years, 30 pack-year currently smoking OR have quit w/in 15years.) does qualify.   Lung Cancer Screening Referral: Deferred  Additional Screening:    Vision Screening: Recommended annual ophthalmology exams for early detection of glaucoma and other disorders of the eye. Is the patient up to date with their annual eye exam?  No  Who is the provider or what is the name of the office in which the patient attends annual eye exams? Deferred If pt is not established with a provider, would they like to be referred to a provider to establish care? No .   Dental Screening: Recommended annual dental exams for proper oral hygiene  Community Resource Referral / Chronic Care Management:  CRR required this visit?  No   CCM required this visit?  No      Plan:     I have personally reviewed and noted the following in the patient's chart:    Medical and social history Use of alcohol, tobacco or illicit drugs  Current medications and supplements including opioid prescriptions. Patient is currently taking opioid prescriptions. Information provided to patient regarding non-opioid alternatives. Patient advised to discuss non-opioid treatment plan with their provider. Functional ability and status Nutritional status Physical activity Advanced directives List of other physicians Hospitalizations, surgeries, and ER visits in previous 12 months Vitals Screenings to include cognitive, depression, and falls Referrals and appointments  In addition, I have reviewed and discussed with patient certain preventive protocols, quality metrics, and best practice recommendations. A written personalized care plan for preventive services as well as general preventive health recommendations were provided to patient.     Tillie Rung, LPN   [id number]   Nurse Notes: None

## 2022-10-05 ENCOUNTER — Encounter (HOSPITAL_COMMUNITY): Payer: Self-pay | Admitting: Student in an Organized Health Care Education/Training Program

## 2022-10-05 ENCOUNTER — Telehealth (INDEPENDENT_AMBULATORY_CARE_PROVIDER_SITE_OTHER): Payer: Medicare Other | Admitting: Student in an Organized Health Care Education/Training Program

## 2022-10-05 DIAGNOSIS — Z8659 Personal history of other mental and behavioral disorders: Secondary | ICD-10-CM | POA: Diagnosis not present

## 2022-10-05 DIAGNOSIS — F1910 Other psychoactive substance abuse, uncomplicated: Secondary | ICD-10-CM | POA: Diagnosis not present

## 2022-10-05 NOTE — Progress Notes (Signed)
BH MD/PA/NP OP Progress Note   Virtual Visit via Video Note  I connected with Alejandro Soto on 10/05/22 at  4:00 PM EDT by a video enabled telemedicine application and verified that I am speaking with the correct person using two identifiers.  Location: Patient: Home Provider: [facility]   I discussed the limitations of evaluation and management by telemedicine and the availability of in person appointments. The patient expressed understanding and agreed to proceed.   10/05/2022 4:18 PM Alejandro Soto  MRN:  [id number]  Chief Complaint:  Chief Complaint  Patient presents with   Follow-up   HPI:  Alejandro Soto is a 37 yr old male who presents for Follow Up.  PPHx is significant for Substance Induced Thought Disorder, Polysubstance Abuse (Cocaine, Fentanyl, Opioids), Depression, Anxiety, and a history of Paranoid Schizophrenia, and ~5 Prior Psychiatric Hospitalizations (last [facility] 01/2022), and no history of Suicide Attempts or Self Injurious Behavior.  PMHx is significant for Sickle Cell Disease.   He reports that he continues to do well.  He reports no significant depressive or anxiety symptoms.  He reports no periods of AVH.  He reports some ringing in his ears but reports that this has been going on for years.  He reports he is thinking of quitting smoking.  Discussed when 800 quit now with him and he reports he has used them in the past.  Discussed with him that since he is doing well we would not start any medicines at this time and he was agreeable with this.  He reports no SI, HI, or AVH.  He reports sleep is good.  He reports appetite is doing good.  He reports no concerns present.  He will return for follow-up in approximately 2 months.   Discussed with patient that Resident Provider would be transitioning their care to another Resident Provider starting July 2024.  He reported understanding and had no concerns.   Visit Diagnosis:    ICD-10-CM   1. History of paranoid schizophrenia   Z86.59     2. Polysubstance abuse  F19.10       Past Psychiatric History: Substance Induced Thought Disorder, Polysubstance Abuse (Cocaine, Fentanyl, Opioids), Depression, Anxiety, and a history of Paranoid Schizophrenia, and ~5 Prior Psychiatric Hospitalizations (last [facility] 01/2022), and no history of Suicide Attempts or Self Injurious Behavior.  PMHx is significant for Sickle Cell Disease.   Past Medical History:  Past Medical History:  Diagnosis Date   Insomnia 08/2019   Pneumonia    Sickle cell anemia     Past Surgical History:  Procedure Laterality Date   NO PAST SURGERIES      Family Psychiatric History: Father- Depression, EtOH Abuse No Known Suicides.  Family History:  Family History  Problem Relation Age of Onset   Cancer Maternal Grandmother        colon    Diabetes Father    Cancer Father 12       Prostate   Asthma Brother     Social History:  Social History   Socioeconomic History   Marital status: Single    Spouse name: Not on file   Number of children: Not on file   Years of education: Not on file   Highest education level: Not on file  Occupational History   Not on file  Tobacco Use   Smoking status: Some Days    Packs/day: 0.33    Years: 5.00    Additional pack years: 0.00    Total pack years: 1.65  Types: Cigarettes   Smokeless tobacco: Never  Vaping Use   Vaping Use: Never used  Substance and Sexual Activity   Alcohol use: No    Alcohol/week: 0.0 standard drinks of alcohol   Drug use: No   Sexual activity: Yes    Birth control/protection: Condom  Other Topics Concern   Not on file  Social History Narrative   Not on file   Social Determinants of Health   Financial Resource Strain: Low Risk  (10/01/2022)   Overall Financial Resource Strain (CARDIA)    Difficulty of Paying Living Expenses: Not hard at all  Food Insecurity: No Food Insecurity (10/01/2022)   Hunger Vital Sign    Worried About Running Out of Food in the Last Year:  Never true    Ran Out of Food in the Last Year: Never true  Transportation Needs: No Transportation Needs (10/01/2022)   PRAPARE - Administrator, Civil Service (Medical): No    Lack of Transportation (Non-Medical): No  Physical Activity: Sufficiently Active (10/01/2022)   Exercise Vital Sign    Days of Exercise per Week: 3 days    Minutes of Exercise per Session: 60 min  Stress: No Stress Concern Present (10/01/2022)   [employer] of Occupational Health - Occupational Stress Questionnaire    Feeling of Stress : Not at all  Social Connections: Moderately Integrated (10/01/2022)   Social Connection and Isolation Panel [NHANES]    Frequency of Communication with Friends and Family: More than three times a week    Frequency of Social Gatherings with Friends and Family: More than three times a week    Attends Religious Services: More than 4 times per year    Active Member of [employer] or Organizations: Yes    Attends Banker Meetings: More than 4 times per year    Marital Status: Never married    Allergies:  Allergies  Allergen Reactions   Amoxicillin Diarrhea    Did it involve swelling of the face/tongue/throat, SOB, or low BP? No Did it involve sudden or severe rash/hives, skin peeling, or any reaction on the inside of your mouth or nose? No Did you need to seek medical attention at a hospital or doctor's office? No When did it last happen?      childhood If all above answers are "NO", may proceed with cephalosporin use.     Metabolic Disorder Labs: Lab Results  Component Value Date   HGBA1C <4.0 09/07/2013   MPG NOT CALCULATED 09/07/2013   No results found for: "PROLACTIN" Lab Results  Component Value Date   CHOL 136 04/10/2021   TRIG 111 04/10/2021   HDL 31 (L) 04/10/2021   CHOLHDL 4.4 04/10/2021   LDLCALC 84 04/10/2021   Lab Results  Component Value Date   TSH 1.070 07/11/2021   TSH 0.78 09/11/2016    Therapeutic Level Labs: No results  found for: "LITHIUM" No results found for: "VALPROATE" No results found for: "CBMZ"  Current Medications: Current Outpatient Medications  Medication Sig Dispense Refill   busPIRone (BUSPAR) 5 MG tablet TAKE 1 TABLET BY MOUTH TWICE A DAY (Patient not taking: Reported on 06/02/2022) 180 tablet 1   folic acid (FOLVITE) 1 MG tablet Take 1 tablet (1 mg total) by mouth daily. 30 tablet 11   gabapentin (NEURONTIN) 600 MG tablet Take 1 tablet (600 mg total) by mouth 2 (two) times daily. Take 1 capsule, by mouth, 2 times a day. (Patient not taking: Reported on 09/15/2022) 60 tablet  11   hydroxyurea (HYDREA) 500 MG capsule Take 3 capsules (1,500 mg total) by mouth daily. May take with food to minimize GI side effects. 270 capsule 3   ibuprofen (ADVIL) 800 MG tablet Take 1 tablet (800 mg total) by mouth every 8 (eight) hours as needed for mild pain. 90 tablet 0   Multiple Vitamin (MULTIVITAMIN) tablet Take 2 tablets by mouth daily.      oxyCODONE-acetaminophen (PERCOCET) 10-325 MG tablet Take 1 tablet by mouth every 4 (four) hours as needed for pain. 30 tablet 0   REXULTI 1 MG TABS  (Patient not taking: Reported on 03/03/2022)     risperiDONE (RISPERDAL) 1 MG tablet TAKE 1 TABLET BY MOUTH TWICE A DAY (Patient not taking: Reported on 09/15/2022) 180 tablet 1   triamcinolone (KENALOG) 0.025 % ointment Apply 1 application topically 2 (two) times daily. 30 g 2   Vitamin D, Ergocalciferol, (DRISDOL) 1.25 MG (50000 UNIT) CAPS capsule Take 1 capsule (50,000 Units total) by mouth every 7 (seven) days for 90 doses. 12 capsule 6   No current facility-administered medications for this visit.     Musculoskeletal: Strength & Muscle Tone: within normal limits Gait & Station: normal Patient leans: N/A  Psychiatric Specialty Exam: Review of Systems  Respiratory:  Negative for cough and shortness of breath.   Cardiovascular:  Negative for chest pain.  Gastrointestinal:  Negative for abdominal pain, constipation,  diarrhea, nausea and vomiting.  Neurological:  Negative for weakness and headaches.  Psychiatric/Behavioral:  Negative for dysphoric mood, hallucinations, sleep disturbance and suicidal ideas. The patient is not nervous/anxious.     There were no vitals taken for this visit.There is no height or weight on file to calculate BMI.  General Appearance: Casual and Fairly Groomed  Eye Contact:  Good  Speech:  Clear and Coherent and Normal Rate  Volume:  Normal  Mood:   "ok"  Affect:  Congruent  Thought Process:  Coherent and Goal Directed  Orientation:  Full (Time, Place, and Person)  Thought Content: WDL and Logical   Suicidal Thoughts:  No  Homicidal Thoughts:  No  Memory:  Immediate;   Good  Judgement:  Fair  Insight:  Fair  Psychomotor Activity:  Normal  Concentration:  Concentration: Good and Attention Span: Good  Recall:  Good  Fund of Knowledge: Good  Language: Good  Akathisia:  Negative  Handed:  Left  AIMS (if indicated): not done  Assets:  Communication Skills Desire for Improvement Housing Resilience  ADL's:  Intact  Cognition: WNL  Sleep:  Good   Screenings: GAD-7    Flowsheet Row Office Visit from 08/10/2022 in [facility] Office Visit from 05/29/2020 in [facility] Office Visit from 11/01/2017 in [facility]  Total GAD-7 Score 2 0 12      PHQ2-9    Flowsheet Row Clinical Support from 10/01/2022 in [facility] Office Visit from 09/15/2022 in [facility] Office Visit from 08/10/2022 in [facility] Office Visit from 06/02/2022 in [facility] Office Visit from 03/03/2022 in [facility]  PHQ-2 Total Score 0 0 1 0 0  PHQ-9 Total Score -- -- 7 -- --        Assessment and Plan:  Alejandro Soto is a 37 yr old male who presents for Follow Up.  PPHx is significant for Substance Induced Thought  Disorder, Polysubstance Abuse (Cocaine,  Fentanyl, Opioids), Depression, Anxiety, and a history of Paranoid Schizophrenia, and ~5 Prior Psychiatric Hospitalizations (last [facility] 01/2022), and no history of Suicide Attempts or Self Injurious Behavior.  PMHx is significant for Sickle Cell Disease.    Mandell continues to do well and denies any AVH or paranoia, and he is not displaying any signs of responding to internal stimuli.  We will continue to apply the principal of first do no harm and as he is not displaying any signs of an underlying primary thought disorder we will not start any antipsychotics which do have many potential side effects.  He will return for follow-up in approximately 2 months.   Substance Induced Thought Disorder (not currently Psychotic) r/out Primary Thought Disorder: -Currently not endorsing any AVH or Paranoia. -Will not start any antipsychotics at this time    Collaboration of Care:   Patient/Guardian was advised Release of Information must be obtained prior to any record release in order to collaborate their care with an outside provider. Patient/Guardian was advised if they have not already done so to contact the registration department to sign all necessary forms in order for [place] to release information regarding their care.   Consent: Patient/Guardian gives verbal consent for treatment and assignment of benefits for services provided during this visit. Patient/Guardian expressed understanding and agreed to proceed.    Lauro Franklin, MD 10/05/2022, 4:18 PM  Follow Up Instructions:    I discussed the assessment and treatment plan with the patient. The patient was provided an opportunity to ask questions and all were answered. The patient agreed with the plan and demonstrated an understanding of the instructions.   The patient was advised to call back or seek an in-person evaluation if the symptoms worsen or if the condition fails to improve as anticipated.  I  provided 15 minutes of non-face-to-face time during this encounter.   Lauro Franklin, MD

## 2022-10-06 ENCOUNTER — Other Ambulatory Visit: Payer: Medicare Other

## 2022-10-06 ENCOUNTER — Encounter: Payer: Self-pay | Admitting: Family Medicine

## 2022-10-06 DIAGNOSIS — F1120 Opioid dependence, uncomplicated: Secondary | ICD-10-CM

## 2022-10-06 DIAGNOSIS — Z79891 Long term (current) use of opiate analgesic: Secondary | ICD-10-CM

## 2022-10-08 LAB — DRUG SCREEN 764883 11+OXYCO+ALC+CRT-BUND
Amphetamines, Urine: NEGATIVE ng/mL
BENZODIAZ UR QL: NEGATIVE ng/mL
Barbiturate: NEGATIVE ng/mL
Cannabinoid Quant, Ur: NEGATIVE ng/mL
Cocaine (Metabolite): NEGATIVE ng/mL
Creatinine: 15.2 mg/dL — ABNORMAL LOW (ref 20.0–300.0)
Ethanol: NEGATIVE %
Meperidine: NEGATIVE ng/mL
Methadone Screen, Urine: NEGATIVE ng/mL
OPIATE SCREEN URINE: NEGATIVE ng/mL
Oxycodone/Oxymorphone, Urine: NEGATIVE ng/mL
Phencyclidine: NEGATIVE ng/mL
Propoxyphene: NEGATIVE ng/mL
Tramadol: NEGATIVE ng/mL
pH, Urine: 6.7 (ref 4.5–8.9)

## 2022-10-08 LAB — SPECIFIC GRAVITY: Specific Gravity: 1.0031

## 2022-10-09 ENCOUNTER — Other Ambulatory Visit: Payer: Self-pay | Admitting: Family Medicine

## 2022-10-09 ENCOUNTER — Telehealth: Payer: Self-pay | Admitting: Family Medicine

## 2022-10-09 DIAGNOSIS — G894 Chronic pain syndrome: Secondary | ICD-10-CM

## 2022-10-09 DIAGNOSIS — D571 Sickle-cell disease without crisis: Secondary | ICD-10-CM

## 2022-10-09 MED ORDER — OXYCODONE-ACETAMINOPHEN 10-325 MG PO TABS: 1.0000 | ORAL_TABLET | ORAL | 0 refills | Status: DC | PRN

## 2022-10-09 NOTE — Progress Notes (Unsigned)
Reviewed PDMP substance reporting system prior to prescribing opiate medications. No inconsistencies noted.  Meds ordered this encounter  Medications   oxyCODONE-acetaminophen (PERCOCET) 10-325 MG tablet    Sig: Take 1 tablet by mouth every 4 (four) hours as needed for pain.    Dispense:  30 tablet    Refill:  0    Order Specific Question:   Supervising Provider    Answer:   JEGEDE, OLUGBEMIGA E [[id number]]   Bernabe Dorce Moore Reyaansh Merlo  APRN, MSN, FNP-C Patient Care Center [facility] [address]  [place], [place] [address] [phone]  

## 2022-10-09 NOTE — Telephone Encounter (Signed)
Pt called asking if he can get his medication now that he has done his labs

## 2022-10-26 NOTE — Telephone Encounter (Signed)
Caller & Relationship to patient:  MRN #  [id number]   Call Back Number:   Date of Last Office Visit: 09/25/2022     Date of Next Office Visit: 10/01/2022    Medication(s) to be Refilled: Oxycodone, Folic acid, Vitamin D & Hydroxyurea   [facility] [place]   He has been out of TOWN!  ** Please notify patient to allow 48-72 hours to process** **Let patient know to contact pharmacy at the end of the day to make sure medication is ready. ** **If patient has not been seen in a year or longer, book an appointment **Advise to use MyChart for refill requests OR to contact their pharmacy

## 2022-10-28 ENCOUNTER — Other Ambulatory Visit: Payer: Self-pay

## 2022-10-28 ENCOUNTER — Other Ambulatory Visit: Payer: Self-pay | Admitting: Family Medicine

## 2022-10-28 DIAGNOSIS — G894 Chronic pain syndrome: Secondary | ICD-10-CM

## 2022-10-28 DIAGNOSIS — D571 Sickle-cell disease without crisis: Secondary | ICD-10-CM

## 2022-10-28 MED ORDER — OXYCODONE-ACETAMINOPHEN 10-325 MG PO TABS
1.0000 | ORAL_TABLET | ORAL | 0 refills | Status: DC | PRN
Start: 2022-10-28 — End: 2022-11-10

## 2022-10-28 NOTE — Progress Notes (Signed)
Reviewed PDMP substance reporting system prior to prescribing opiate medications. No inconsistencies noted.  Meds ordered this encounter  Medications   oxyCODONE-acetaminophen (PERCOCET) 10-325 MG tablet    Sig: Take 1 tablet by mouth every 4 (four) hours as needed for pain.    Dispense:  30 tablet    Refill:  0    Order Specific Question:   Supervising Provider    Answer:   JEGEDE, OLUGBEMIGA E [[id number]]   Zuleyka Kloc Moore Damyia Strider  APRN, MSN, FNP-C Patient Care Center [facility] [address]  [place], [place] [address] [phone]  

## 2022-10-28 NOTE — Telephone Encounter (Signed)
From: Michaele Offer To: Office of Julianne Handler, [place] Sent: 10/28/2022 10:17 AM EDT Subject: Medication Renewal Request  Refills have been requested for the following medications:   oxyCODONE-acetaminophen (PERCOCET) 10-325 MG tablet [Lachina Hollis]  [facility] pharmacy[id number] [place], [place] - [address] AT [facility] [address] Delivery method: Daryll Drown

## 2022-10-28 NOTE — Telephone Encounter (Signed)
Please advise K[facility] 

## 2022-10-30 DIAGNOSIS — F1910 Other psychoactive substance abuse, uncomplicated: Secondary | ICD-10-CM | POA: Insufficient documentation

## 2022-11-02 ENCOUNTER — Telehealth: Payer: Self-pay

## 2022-11-02 NOTE — Transitions of Care (Post Inpatient/ED Visit) (Unsigned)
   11/02/2022  Name: Alejandro Soto MRN: [id number] DOB: 1985-11-06  Today's TOC FU Call Status: Today's TOC FU Call Status:: Unsuccessul Call (1st Attempt) Unsuccessful Call (1st Attempt) Date: 11/02/22  Attempted to reach the patient regarding the most recent Inpatient/ED visit.  Follow Up Plan: Additional outreach attempts will be made to reach the patient to complete the Transitions of Care (Post Inpatient/ED visit) call.   Signature  Agnes Lawrence, CMA (AAMA)  [facility] [phone]

## 2022-11-03 NOTE — Transitions of Care (Post Inpatient/ED Visit) (Unsigned)
   11/03/2022  Name: Alejandro Soto MRN: [id number] DOB: July 09, 1985  Today's TOC FU Call Status: Today's TOC FU Call Status:: Unsuccessful Call (2nd Attempt) Unsuccessful Call (1st Attempt) Date: 11/02/22 Unsuccessful Call (2nd Attempt) Date: 11/03/22  Attempted to reach the patient regarding the most recent Inpatient/ED visit.  Follow Up Plan: Additional outreach attempts will be made to reach the patient to complete the Transitions of Care (Post Inpatient/ED visit) call.   Signature   Agnes Lawrence, CMA (AAMA)  [facility] [phone]

## 2022-11-04 NOTE — Transitions of Care (Post Inpatient/ED Visit) (Signed)
   11/04/2022  Name: Alejandro Soto MRN: [id number] DOB: 05-28-1986  Today's TOC FU Call Status: Today's TOC FU Call Status:: Unsuccessful Call (3rd Attempt) Unsuccessful Call (1st Attempt) Date: 11/02/22 Unsuccessful Call (2nd Attempt) Date: 11/03/22 Unsuccessful Call (3rd Attempt) Date: 11/04/22  Attempted to reach the patient regarding the most recent Inpatient/ED visit.  Follow Up Plan: No further outreach attempts will be made at this time. We have been unable to contact the patient.  Signature Agnes Lawrence, CMA (AAMA)  [facility] [phone]

## 2022-11-10 ENCOUNTER — Other Ambulatory Visit: Payer: Self-pay | Admitting: Nurse Practitioner

## 2022-11-10 ENCOUNTER — Other Ambulatory Visit: Payer: Self-pay

## 2022-11-10 DIAGNOSIS — D571 Sickle-cell disease without crisis: Secondary | ICD-10-CM

## 2022-11-10 DIAGNOSIS — G894 Chronic pain syndrome: Secondary | ICD-10-CM

## 2022-11-10 MED ORDER — OXYCODONE-ACETAMINOPHEN 10-325 MG PO TABS
1.0000 | ORAL_TABLET | ORAL | 0 refills | Status: DC | PRN
Start: 2022-11-12 — End: 2022-11-26

## 2022-11-10 NOTE — Telephone Encounter (Signed)
From: Michaele Offer To: Office of Julianne Handler, [place] Sent: 11/10/2022 9:34 AM EDT Subject: Medication Renewal Request  Refills have been requested for the following medications:   oxyCODONE-acetaminophen (PERCOCET) 10-325 MG tablet [Lachina Hollis]  [id number] [place], [place] - [address], [address] AT [facility] [address] Delivery method: Daryll Drown

## 2022-11-10 NOTE — Telephone Encounter (Signed)
Please advise in [place] absence. Thanks. kh

## 2022-11-24 ENCOUNTER — Ambulatory Visit (HOSPITAL_COMMUNITY)
Admission: EM | Admit: 2022-11-24 | Discharge: 2022-11-24 | Disposition: A | Discharge status: Home / Self Care | Payer: Medicare Other | Attending: Psychiatry | Admitting: Psychiatry

## 2022-11-24 DIAGNOSIS — F1120 Opioid dependence, uncomplicated: Secondary | ICD-10-CM | POA: Insufficient documentation

## 2022-11-24 DIAGNOSIS — F209 Schizophrenia, unspecified: Secondary | ICD-10-CM | POA: Insufficient documentation

## 2022-11-24 NOTE — Progress Notes (Signed)
   11/24/22 0939  [facility] Triage Screening (Walk-ins at [facility] only)  How Did You Hear About [place]? Family/Friend  What Is the Reason for Your Visit/Call Today? Alejandro Soto is a 37 y/o male with hx of sickle cell anemia. States that because of his medical diagnoses he became addicted to opioids (2015). His mother encouraged him to come in to the [facility] for treatment today. He is prescribed oxycodone. However, purchases additional pills from "people". He thought that he was purchasing opioids but believes his drug supplier has been giving him "fake drugs". States that after he started taking the drugs a month ago he started hallucinating, "going into a  sickle cell crises", and had one seizure (1 month ago). Because of his symptoms patient has been in the hospital 2x's ("facility in [place]" and [facility]). Discharged from [facility] 11/21/2022.  His last use of substances (opioids) was "a few days ago". Denies use of alcohol. No current SI, HI, and AVH's. No therapist. However, has a psychiatrist. He doesn't recall the name of his psychiatrist but the last visit was 1 month ago. He has a upcoming appointment 12/14/22. He lives with his mother. Unemployed.  How Long Has This Been Causing You Problems? 1 wk - 1 month  Have You Recently Had Any Thoughts About Hurting Yourself? No  Are You Planning to Commit Suicide/Harm Yourself At This time? No  Have you Recently Had Thoughts About Hurting Someone [name]? No  Are You Planning To Harm Someone At This Time? No  Are you currently experiencing any auditory, visual or other hallucinations? No  Have You Used Any Alcohol or Drugs in the Past 24 Hours? No  Do you have any current medical co-morbidities that require immediate attention? Yes  Please describe current medical co-morbidities that require immediate attention: Sickle Cell diagnoses  Clinician description of patient physical appearance/behavior: Calm and cooperative. Casually dressed. Depressed mood.  What Do  You Feel Would Help You the Most Today? Treatment for Depression or other mood problem;Medication(s)  If access to [facility] was not available, would you have sought care in the Emergency Department? No  Determination of Need Routine (7 days)  Options For Referral Medication Management;Facility-Based Crisis;Chemical Dependency Intensive Outpatient Therapy (CDIOP);Outpatient Therapy

## 2022-11-24 NOTE — Discharge Instructions (Addendum)
     [facility] Website:  [email or web address] Address:  [address], [place], [place] [address] Phone:  [phone]    CORE PROGRAMS Medication-Assisted Treatment > Our goal is to help you achieve and maintain a substance abuse-free life. We know this can be especially challenging for you, especially if you battle an addiction to opioids (heroin, oxycodone, hydrocodone, etc.). [facility] is an accredited facility allowed to provide medications that can help you medically and emotionally, so you can stop misusing prescription or illegal drugs. Adolescent Programs > Women's Recovery Center > Treatment with Housing (BATS) > We have special programs for adolescents and women to address their specific needs and provide a safe place for health and recovery. We also have programs that can help with housing and transportation.   Residential Treatment Programs > Residential treatment allows you to focus solely on your substance misuse and mental health recovery without distraction from some of life's stressors. By living on campus, you can be monitored and supported by staff 24 hours a day. [facility] residential treatment programs can be found at the [facility] in [place], [place], within the beautiful campus of the [place] [facility]. [facility] is not a locked facility. All clients are at [facility] by choice.  OUTPATIENT TREATMENT Outpatient Treatment Programs > Periodic group, individual and family counseling services provide support to persons seeking recovery. Our counselors are trained in providing substance abuse-specific treatment utilizing several evidence-based approaches including motivational interviewing, cognitive behavioral therapy, trauma therapy and others. Substance Abuse Intensive Outpatient Treatment > Individual and group activities are the focal point of this program. We work on assisting you to begin recovery and learn  the skills you will need to stay on your new healthy path. You will meet three days a week, individually and with a group, as we focus on recovery stabilization. This program is designed to provide intensive services and allow you to stay at home and not miss work as you begin your recovery journey. Substance Abuse Comprehensive Outpatient Treatment > This program allows for structure and support, so you can achieve and maintain your path to wellness and recovery. We emphasize reducing or eliminating your harmful use of substances, stabilizing mental illness symptoms, and we help you develop a healthy social network. [facility] counselors can also help address lifestyle changes and help you focus on developing educational or work skills and improving your family situation if these are goals you would like to set. You will meet up to five days a week, including individual and group counseling. This program is designed to help people who have recently been discharged from a hospital or crisis center, or to prevent hospitalization due to substance misuse issues or severe symptoms of mental illness.    We give priority admission if you are pregnant, an intravenous drug user, are at risk of HIV/AIDS or tuberculosis (TB), or have opioid management treatment needs.     

## 2022-11-24 NOTE — ED Provider Notes (Signed)
Behavioral Health Urgent Care Medical Screening Exam  Patient Name: Alejandro Soto MRN: [id number] Date of Evaluation: 11/24/22 Chief Complaint:   Diagnosis:  Final diagnoses:  Uncomplicated opioid dependence (HCC)    History of Present illness: Alejandro Soto is a 37 y.o. male presents to [facility] urgent care requesting to detox from opiates.  Alejandro Soto reports Alejandro Soto has recently started buying opiates off the street and fears that they are laced with fentanyl.  States Alejandro Soto was recently hospitalized due to unintentional overdose and feels that Alejandro Soto needs to stop using opiates.  Alejandro Soto is denying suicidal or homicidal ideations.  Denies auditory or visual hallucinations.    Alejandro Soto states Alejandro Soto was diagnosed with schizophrenia in 2010 however, has never followed up with therapy or psychiatry.  Denied that Alejandro Soto is prescribed any psychotropic medications.  Chart review patient currently receives oxycodone 10-325 mg x 30 tablets monthly due to sickle cell diagnoses.  Patient denies plans to stop taking oxycodone at this time.  States " sometime the pain is so server that oxycodone medication does not help when I am in crisis."  Reports Alejandro Soto is prescribed gabapentin which is not helpful for his pain either.  Alejandro Soto reports receiving Narcan at discharge.  Will make additional outpatient resources available.  Consider CD -IOP and/or ADS for additional outpatient resources.  Patient declined to stop using prescription medications at this time.   During evaluation Alejandro Soto is sitting in no acute distress. She is alert/oriented x 4; calm/cooperative; and mood congruent with affect. Alejandro Soto is speaking in a clear tone at moderate volume, and normal pace; with good eye contact.  His thought process is coherent and relevant; There is no indication that Alejandro Soto is currently responding to internal/external stimuli or experiencing delusional thought content; and Alejandro Soto has denied suicidal/self-harm/homicidal ideation, psychosis, and paranoia.    Patient has remained calm throughout assessment and has answered questions appropriately.    Alejandro Soto is educated and verbalizes understanding of mental health resources and other crisis services in the community. Alejandro Soto is instructed to call 911 and present to the nearest emergency room should  Alejandro Soto experience any suicidal/homicidal ideation, auditory/visual/hallucinations, or detrimental worsening of his mental health condition. Alejandro Soto was a also advised by Clinical research associate that Alejandro Soto could call the toll-free phone on insurance card to assist with identifying in network counselors and agencies or number on back of Medicaid card t speak with care coordinator   Flowsheet Row ED from 11/24/2022 in [facility]  C-SSRS RISK CATEGORY No Risk       Psychiatric Specialty Exam  Presentation  General Appearance:Appropriate for Environment; Casual; Fairly Groomed  Eye Contact:Good  Speech:Clear and Coherent; Normal Rate; Slurred  Speech Volume:Increased  Handedness:No data recorded  Mood and Affect  Mood: -- (frustrated)  Affect: Congruent   Thought Process  Thought Processes: Coherent; Goal Directed; Linear  Descriptions of Associations:Intact  Orientation:Full (Time, Place and Person)  Thought Content:Logical  Diagnosis of Schizophrenia or Schizoaffective disorder in past: No  Duration of Psychotic Symptoms: Greater than six months  Hallucinations:None  Ideas of Reference:None  Suicidal Thoughts:No  Homicidal Thoughts:No   Sensorium  Memory: Immediate Fair  Judgment: Poor  Insight: Shallow   Executive Functions  Concentration: Fair  Attention Span: Fair  Recall: [employer] of Knowledge: Fair  Language: Fair   Psychomotor Activity  Psychomotor Activity: Normal   Assets  Assets: Manufacturing systems engineer; Financial Resources/Insurance; Housing; Social Support   Sleep  Sleep: Fair  Number of hours: 5  Physical  Exam: Physical Exam Vitals and nursing note reviewed.  Constitutional:      Appearance: Normal appearance.  Cardiovascular:     Rate and Rhythm: Normal rate and regular rhythm.  Neurological:     Mental Status: Alejandro Soto is alert and oriented to person, place, and time.  Psychiatric:        Mood and Affect: Mood normal.        Behavior: Behavior normal.        Thought Content: Thought content normal.    Review of Systems  Constitutional: Negative.   Psychiatric/Behavioral:  Negative for depression and suicidal ideas. The patient is not nervous/anxious.   All other systems reviewed and are negative.  There were no vitals taken for this visit. There is no height or weight on file to calculate BMI.  Musculoskeletal: Strength & Muscle Tone: within normal limits Gait & Station: normal Patient leans: N/A   [facility] MSE Discharge Disposition for Follow up and Recommendations: Based on my evaluation the patient does not appear to have an emergency medical condition and can be discharged with resources and follow up care in outpatient services for Substance Abuse Intensive Outpatient Program   Oneta Rack, NP 11/24/2022, 2:14 PM

## 2022-11-26 ENCOUNTER — Other Ambulatory Visit: Payer: Self-pay

## 2022-11-26 DIAGNOSIS — G894 Chronic pain syndrome: Secondary | ICD-10-CM

## 2022-11-26 DIAGNOSIS — D571 Sickle-cell disease without crisis: Secondary | ICD-10-CM

## 2022-11-26 MED ORDER — OXYCODONE-ACETAMINOPHEN 10-325 MG PO TABS
1.0000 | ORAL_TABLET | ORAL | 0 refills | Status: DC | PRN
Start: 2022-11-26 — End: 2022-12-09

## 2022-11-26 NOTE — Telephone Encounter (Signed)
Please advise K[facility] 

## 2022-11-26 NOTE — Telephone Encounter (Signed)
From: Michaele Offer To: Office of Donell Beers, [place] Sent: 11/26/2022 11:33 AM EDT Subject: Medication Renewal Request  Refills have been requested for the following medications:   oxyCODONE-acetaminophen (PERCOCET) 10-325 MG tablet [Folashade R Paseda]  Preferred pharmacy: [id number] - [place], [place] - [address], [address] AT [facility] [address] Delivery method: Daryll Drown

## 2022-12-09 ENCOUNTER — Other Ambulatory Visit: Payer: Self-pay

## 2022-12-09 ENCOUNTER — Other Ambulatory Visit: Payer: Self-pay | Admitting: Family Medicine

## 2022-12-09 DIAGNOSIS — G894 Chronic pain syndrome: Secondary | ICD-10-CM

## 2022-12-09 DIAGNOSIS — D571 Sickle-cell disease without crisis: Secondary | ICD-10-CM

## 2022-12-09 MED ORDER — OXYCODONE-ACETAMINOPHEN 10-325 MG PO TABS
1.0000 | ORAL_TABLET | ORAL | 0 refills | Status: DC | PRN
Start: 2022-12-09 — End: 2022-12-22

## 2022-12-09 NOTE — Telephone Encounter (Signed)
Please advise Kh 

## 2022-12-09 NOTE — Progress Notes (Signed)
Reviewed PDMP substance reporting system prior to prescribing opiate medications. No inconsistencies noted.  Meds ordered this encounter  Medications   oxyCODONE-acetaminophen (PERCOCET) 10-325 MG tablet    Sig: Take 1 tablet by mouth every 4 (four) hours as needed for up to 15 days for pain.    Dispense:  30 tablet    Refill:  0    Order Specific Question:   Supervising Provider    Answer:   Quentin Angst [[id number]]   Nolon Nations  APRN, MSN, FNP-C Patient Care [facility] [address] [place], [place] [address] [phone]

## 2022-12-14 ENCOUNTER — Encounter (HOSPITAL_COMMUNITY): Payer: Self-pay

## 2022-12-14 ENCOUNTER — Encounter (HOSPITAL_COMMUNITY): Payer: Medicare Other | Admitting: Student in an Organized Health Care Education/Training Program

## 2022-12-14 NOTE — Progress Notes (Deleted)
BH MD/PA/NP OP Progress Note   Virtual Visit via Video Note  I connected with Alejandro Soto on 12/14/22 at  1:00 PM EDT by a video enabled telemedicine application and verified that I am speaking with the correct person using two identifiers.  Location: Patient: *** Provider: [facility]   I discussed the limitations of evaluation and management by telemedicine and the availability of in person appointments. The patient expressed understanding and agreed to proceed.   12/14/2022 7:13 AM Alejandro Soto  MRN:  [id number]  Chief Complaint:  No chief complaint on file.  HPI:  Alejandro Soto is a 37 yr old male who presents for Follow Up.  PPHx is significant for Substance Induced Thought Disorder, Polysubstance Abuse (Cocaine, Fentanyl, Opioids), Depression, Anxiety, and a history of Paranoid Schizophrenia, and ~5 Prior Psychiatric Hospitalizations (last [facility] 01/2022), and no history of Suicide Attempts or Self Injurious Behavior.  PMHx is significant for Sickle Cell Disease.   He reports ***   Discussed with patient that Resident Provider would be transitioning their care to another Resident Provider starting July 2024.  He reported understanding and had no concerns.   Visit Diagnosis:  No diagnosis found.   Past Psychiatric History: Substance Induced Thought Disorder, Polysubstance Abuse (Cocaine, Fentanyl, Opioids), Depression, Anxiety, and a history of Paranoid Schizophrenia, and ~5 Prior Psychiatric Hospitalizations (last [facility] 01/2022), and no history of Suicide Attempts or Self Injurious Behavior.  PMHx is significant for Sickle Cell Disease.   Past Medical History:  Past Medical History:  Diagnosis Date   Insomnia 08/2019   Pneumonia    Sickle cell anemia (HCC)     Past Surgical History:  Procedure Laterality Date   NO PAST SURGERIES      Family Psychiatric History: Father- Depression, EtOH Abuse No Known Suicides.  Family History:  Family History  Problem Relation Age of  Onset   Cancer Maternal Grandmother        colon    Diabetes Father    Cancer Father 83       Prostate   Asthma Brother     Social History:  Social History   Socioeconomic History   Marital status: Single    Spouse name: Not on file   Number of children: Not on file   Years of education: Not on file   Highest education level: Not on file  Occupational History   Not on file  Tobacco Use   Smoking status: Some Days    Packs/day: 0.33    Years: 5.00    Additional pack years: 0.00    Total pack years: 1.65    Types: Cigarettes   Smokeless tobacco: Never  Vaping Use   Vaping Use: Never used  Substance and Sexual Activity   Alcohol use: No    Alcohol/week: 0.0 standard drinks of alcohol   Drug use: No   Sexual activity: Yes    Birth control/protection: Condom  Other Topics Concern   Not on file  Social History Narrative   Not on file   Social Determinants of Health   Financial Resource Strain: Low Risk  (10/01/2022)   Overall Financial Resource Strain (CARDIA)    Difficulty of Paying Living Expenses: Not hard at all  Food Insecurity: No Food Insecurity (10/01/2022)   Hunger Vital Sign    Worried About Running Out of Food in the Last Year: Never true    Ran Out of Food in the Last Year: Never true  Transportation Needs: No Transportation Needs (10/01/2022)  PRAPARE - Administrator, Civil Service (Medical): No    Lack of Transportation (Non-Medical): No  Physical Activity: Sufficiently Active (10/01/2022)   Exercise Vital Sign    Days of Exercise per Week: 3 days    Minutes of Exercise per Session: 60 min  Stress: No Stress Concern Present (10/01/2022)   [employer] of Occupational Health - Occupational Stress Questionnaire    Feeling of Stress : Not at all  Social Connections: Moderately Integrated (10/01/2022)   Social Connection and Isolation Panel [NHANES]    Frequency of Communication with Friends and Family: More than three times a week     Frequency of Social Gatherings with Friends and Family: More than three times a week    Attends Religious Services: More than 4 times per year    Active Member of [employer] or Organizations: Yes    Attends Banker Meetings: More than 4 times per year    Marital Status: Never married    Allergies:  Allergies  Allergen Reactions   Amoxicillin Diarrhea    Did it involve swelling of the face/tongue/throat, SOB, or low BP? No Did it involve sudden or severe rash/hives, skin peeling, or any reaction on the inside of your mouth or nose? No Did you need to seek medical attention at a hospital or doctor's office? No When did it last happen?      childhood If all above answers are "NO", may proceed with cephalosporin use.     Metabolic Disorder Labs: Lab Results  Component Value Date   HGBA1C <4.0 09/07/2013   MPG NOT CALCULATED 09/07/2013   No results found for: "PROLACTIN" Lab Results  Component Value Date   CHOL 136 04/10/2021   TRIG 111 04/10/2021   HDL 31 (L) 04/10/2021   CHOLHDL 4.4 04/10/2021   LDLCALC 84 04/10/2021   Lab Results  Component Value Date   TSH 1.070 07/11/2021   TSH 0.78 09/11/2016    Therapeutic Level Labs: No results found for: "LITHIUM" No results found for: "VALPROATE" No results found for: "CBMZ"  Current Medications: Current Outpatient Medications  Medication Sig Dispense Refill   busPIRone (BUSPAR) 5 MG tablet TAKE 1 TABLET BY MOUTH TWICE A DAY (Patient not taking: Reported on 06/02/2022) 180 tablet 1   folic acid (FOLVITE) 1 MG tablet Take 1 tablet (1 mg total) by mouth daily. 30 tablet 11   gabapentin (NEURONTIN) 600 MG tablet Take 1 tablet (600 mg total) by mouth 2 (two) times daily. Take 1 capsule, by mouth, 2 times a day. (Patient not taking: Reported on 09/15/2022) 60 tablet 11   hydroxyurea (HYDREA) 500 MG capsule Take 3 capsules (1,500 mg total) by mouth daily. May take with food to minimize GI side effects. 270 capsule 3    ibuprofen (ADVIL) 800 MG tablet Take 1 tablet (800 mg total) by mouth every 8 (eight) hours as needed for mild pain. 90 tablet 0   Multiple Vitamin (MULTIVITAMIN) tablet Take 2 tablets by mouth daily.      oxyCODONE-acetaminophen (PERCOCET) 10-325 MG tablet Take 1 tablet by mouth every 4 (four) hours as needed for up to 15 days for pain. 30 tablet 0   REXULTI 1 MG TABS  (Patient not taking: Reported on 03/03/2022)     risperiDONE (RISPERDAL) 1 MG tablet TAKE 1 TABLET BY MOUTH TWICE A DAY (Patient not taking: Reported on 09/15/2022) 180 tablet 1   triamcinolone (KENALOG) 0.025 % ointment Apply 1 application topically 2 (two) times  daily. 30 g 2   Vitamin D, Ergocalciferol, (DRISDOL) 1.25 MG (50000 UNIT) CAPS capsule Take 1 capsule (50,000 Units total) by mouth every 7 (seven) days for 90 doses. 12 capsule 6   No current facility-administered medications for this visit.     Musculoskeletal: Strength & Muscle Tone: within normal limits Gait & Station: normal Patient leans: N/A *** Psychiatric Specialty Exam: Review of Systems  There were no vitals taken for this visit.There is no height or weight on file to calculate BMI.  General Appearance: Casual and Fairly Groomed  Eye Contact:  Good  Speech:  Clear and Coherent and Normal Rate  Volume:  Normal  Mood:   "ok"  Affect:  Congruent  Thought Process:  Coherent and Goal Directed  Orientation:  Full (Time, Place, and Person)  Thought Content: WDL and Logical   Suicidal Thoughts:  No  Homicidal Thoughts:  No  Memory:  Immediate;   Good  Judgement:  Fair  Insight:  Fair  Psychomotor Activity:  Normal  Concentration:  Concentration: Good and Attention Span: Good  Recall:  Good  Fund of Knowledge: Good  Language: Good  Akathisia:  Negative  Handed:  Left  AIMS (if indicated): not done  Assets:  Communication Skills Desire for Improvement Housing Resilience  ADL's:  Intact  Cognition: WNL  Sleep:  Good   Screenings: GAD-7     Flowsheet Row Office Visit from 08/10/2022 in [facility] Office Visit from 05/29/2020 in [facility] Office Visit from 11/01/2017 in [facility]  Total GAD-7 Score 2 0 12      PHQ2-9    Flowsheet Row Clinical Support from 10/01/2022 in [facility] Office Visit from 09/15/2022 in [facility] Office Visit from 08/10/2022 in [facility] Office Visit from 06/02/2022 in [facility] Office Visit from 03/03/2022 in [facility]  PHQ-2 Total Score 0 0 1 0 0  PHQ-9 Total Score -- -- 7 -- --      Flowsheet Row ED from 11/24/2022 in [facility]  C-SSRS RISK CATEGORY No Risk        Assessment and Plan:  Jader Desai is a 37 yr old male who presents for Follow Up.  PPHx is significant for Substance Induced Thought Disorder, Polysubstance Abuse (Cocaine, Fentanyl, Opioids), Depression, Anxiety, and a history of Paranoid Schizophrenia, and ~5 Prior Psychiatric Hospitalizations (last [facility] 01/2022), and no history of Suicide Attempts or Self Injurious Behavior.  PMHx is significant for Sickle Cell Disease.    [place] ***   Substance Induced Thought Disorder (not currently Psychotic) r/out Primary Thought Disorder: -Currently not endorsing any AVH or Paranoia. -Will not start any antipsychotics at this time    Collaboration of Care:   Patient/Guardian was advised Release of Information must be obtained prior to any record release in order to collaborate their care with an outside provider. Patient/Guardian was advised if they have not already done so to contact the registration department to sign all necessary forms in order for [place] to release information regarding their care.   Consent: Patient/Guardian gives verbal consent for treatment and assignment of benefits for services provided during this  visit. Patient/Guardian expressed understanding and agreed to proceed.    Lauro Franklin, MD 12/14/2022, 7:13 AM  Follow Up Instructions:    I discussed the assessment and treatment plan with the patient.  The patient was provided an opportunity to ask questions and all were answered. The patient agreed with the plan and demonstrated an understanding of the instructions.   The patient was advised to call back or seek an in-person evaluation if the symptoms worsen or if the condition fails to improve as anticipated.  I provided *** minutes of non-face-to-face time during this encounter.   Lauro Franklin, MD

## 2022-12-14 NOTE — Progress Notes (Signed)
This encounter was created in error - please disregard.

## 2022-12-17 ENCOUNTER — Encounter (HOSPITAL_COMMUNITY): Payer: Medicare Other | Admitting: Student

## 2022-12-22 ENCOUNTER — Ambulatory Visit: Payer: Medicare Other | Admitting: Family Medicine

## 2022-12-22 VITALS — BP 128/85 | HR 79 | Temp 97.2°F | Wt 161.8 lb

## 2022-12-22 DIAGNOSIS — E559 Vitamin D deficiency, unspecified: Secondary | ICD-10-CM | POA: Diagnosis not present

## 2022-12-22 DIAGNOSIS — D571 Sickle-cell disease without crisis: Secondary | ICD-10-CM

## 2022-12-22 DIAGNOSIS — G894 Chronic pain syndrome: Secondary | ICD-10-CM | POA: Diagnosis not present

## 2022-12-22 MED ORDER — IBUPROFEN 800 MG PO TABS
800.0000 mg | ORAL_TABLET | Freq: Three times a day (TID) | ORAL | 0 refills | Status: DC | PRN
Start: 2022-12-22 — End: 2023-03-29

## 2022-12-22 MED ORDER — OXBRYTA 500 MG PO TABS
1500.0000 mg | ORAL_TABLET | Freq: Every day | ORAL | 2 refills | Status: DC
Start: 2022-12-22 — End: 2023-04-22

## 2022-12-22 MED ORDER — OXYCODONE-ACETAMINOPHEN 10-325 MG PO TABS
1.0000 | ORAL_TABLET | Freq: Four times a day (QID) | ORAL | 0 refills | Status: AC | PRN
Start: 2022-12-22 — End: 2023-01-06

## 2022-12-22 NOTE — Patient Instructions (Addendum)
  Use the sickle cell agency:   [facility] [address] [place], [place]  [phone]

## 2022-12-22 NOTE — Progress Notes (Signed)
Subjective   Patient ID: Alejandro Soto, male    DOB: [birth year]  Age: 37 y.o. MRN: [id number]  Chief Complaint  Patient presents with   Sickle Cell Anemia    Follow  up    Alejandro Soto is a very pleasant 37 year old male with a medical history significant for sickle cell disease, chronic pain syndrome, opiate dependence and tolerance, and anemia of chronic disease that presents for 65-month follow-up of chronic conditions. Today, patient is complaining of pain primarily to his back and lower extremities.  He says that pain has not been well-controlled over the past week on his current medication regimen.  He characterizes pain as intermittent and aching.  He denies any fever, chills, chest pain, or shortness of breath.  No urinary symptoms, nausea, vomiting, or diarrhea.    Patient Active Problem List   Diagnosis Date Noted   Opioid dependence, uncomplicated (HCC) 07/11/2021   Insomnia 03/23/2019   Chronic prescription opiate use 03/23/2019   Methadone use 08/12/2015   Chronic back pain greater than 3 months duration 04/09/2015   Abdominal pain, epigastric 12/24/2014   Sickle cell pain crisis (HCC) 12/16/2014   Thrombocytosis (HCC) 07/14/2014   Anemia 07/14/2014   Sore throat 07/03/2014   Sebaceous cyst 07/03/2014   Atopic dermatitis 06/12/2014   Leukocytosis 05/02/2014   Tobacco dependence 04/25/2014   Neck pain, bilateral 03/01/2014   Hb-SS disease without crisis (HCC) 02/22/2014   DJD (degenerative joint disease), lumbosacral 09/18/2013   Neuropathic pain 09/18/2013   Back pain, acute 08/11/2013   Opiate use 05/24/2013   Back pain 11/01/2012   Pain in joint, shoulder region 11/01/2012   Pain in joint, lower leg 11/01/2012   Vitamin D deficiency 04/21/2012   Encounter for therapeutic drug monitoring 04/21/2012   Paranoid schizophrenia (HCC) 04/21/2012   Priapism 04/21/2012   Smoking 04/21/2012   Intermittent pain 01/20/2012   Sickle cell disease, type SS (HCC)  01/20/2012   Past Medical History:  Diagnosis Date   Insomnia 08/2019   Pneumonia    Sickle cell anemia (HCC)    Past Surgical History:  Procedure Laterality Date   NO PAST SURGERIES     Social History   Tobacco Use   Smoking status: Some Days    Packs/day: 0.33    Years: 5.00    Additional pack years: 0.00    Total pack years: 1.65    Types: Cigarettes   Smokeless tobacco: Never  Vaping Use   Vaping Use: Never used  Substance Use Topics   Alcohol use: No    Alcohol/week: 0.0 standard drinks of alcohol   Drug use: No   Social History   Socioeconomic History   Marital status: Single    Spouse name: Not on file   Number of children: Not on file   Years of education: Not on file   Highest education level: Not on file  Occupational History   Not on file  Tobacco Use   Smoking status: Some Days    Packs/day: 0.33    Years: 5.00    Additional pack years: 0.00    Total pack years: 1.65    Types: Cigarettes   Smokeless tobacco: Never  Vaping Use   Vaping Use: Never used  Substance and Sexual Activity   Alcohol use: No    Alcohol/week: 0.0 standard drinks of alcohol   Drug use: No   Sexual activity: Yes    Birth control/protection: Condom  Other Topics Concern   Not on  file  Social History Narrative   Not on file   Social Determinants of Health   Financial Resource Strain: Low Risk  (10/01/2022)   Overall Financial Resource Strain (CARDIA)    Difficulty of Paying Living Expenses: Not hard at all  Food Insecurity: No Food Insecurity (10/01/2022)   Hunger Vital Sign    Worried About Running Out of Food in the Last Year: Never true    Ran Out of Food in the Last Year: Never true  Transportation Needs: Unmet Transportation Needs (12/22/2022)   PRAPARE - Administrator, Civil Service (Medical): Yes    Lack of Transportation (Non-Medical): No  Physical Activity: Sufficiently Active (10/01/2022)   Exercise Vital Sign    Days of Exercise per Week: 3  days    Minutes of Exercise per Session: 60 min  Stress: No Stress Concern Present (10/01/2022)   [employer] of Occupational Health - Occupational Stress Questionnaire    Feeling of Stress : Not at all  Social Connections: Moderately Integrated (10/01/2022)   Social Connection and Isolation Panel [NHANES]    Frequency of Communication with Friends and Family: More than three times a week    Frequency of Social Gatherings with Friends and Family: More than three times a week    Attends Religious Services: More than 4 times per year    Active Member of [employer] or Organizations: Yes    Attends Banker Meetings: More than 4 times per year    Marital Status: Never married  Intimate Partner Violence: Not At Risk (10/01/2022)   Humiliation, Afraid, Rape, and Kick questionnaire    Fear of Current or Ex-Partner: No    Emotionally Abused: No    Physically Abused: No    Sexually Abused: No   Family Status  Relation Name Status   MGM  Deceased   Father  (Not Specified)   Brother  (Not Specified)   Family History  Problem Relation Age of Onset   Cancer Maternal Grandmother        colon    Diabetes Father    Cancer Father 8       Prostate   Asthma Brother    Allergies  Allergen Reactions   Amoxicillin Diarrhea    Did it involve swelling of the face/tongue/throat, SOB, or low BP? No Did it involve sudden or severe rash/hives, skin peeling, or any reaction on the inside of your mouth or nose? No Did you need to seek medical attention at a hospital or doctor's office? No When did it last happen?      childhood If all above answers are "NO", may proceed with cephalosporin use.       Review of Systems  Constitutional: Negative.   HENT: Negative.    Respiratory: Negative.    Cardiovascular: Negative.   Gastrointestinal: Negative.   Genitourinary: Negative.   Musculoskeletal:  Positive for back pain and joint pain.  Skin: Negative.   Neurological: Negative.    Psychiatric/Behavioral: Negative.        Objective:     BP 128/85   Pulse 79   Temp (!) 97.2 F (36.2 C)   Wt 161 lb 12.8 oz (73.4 kg)   SpO2 99%   BMI 24.60 kg/m  BP Readings from Last 3 Encounters:  12/22/22 128/85  09/15/22 111/66  06/02/22 (!) 105/56   Wt Readings from Last 3 Encounters:  12/22/22 161 lb 12.8 oz (73.4 kg)  10/01/22 194 lb (88 kg)  09/15/22  194 lb (88 kg)      Physical Exam Constitutional:      Appearance: Normal appearance.  Eyes:     Pupils: Pupils are equal, round, and reactive to light.  Cardiovascular:     Rate and Rhythm: Normal rate and regular rhythm.  Pulmonary:     Effort: Pulmonary effort is normal.  Abdominal:     General: Bowel sounds are normal.  Skin:    General: Skin is warm.  Neurological:     General: No focal deficit present.     Mental Status: He is alert. Mental status is at baseline.  Psychiatric:        Mood and Affect: Mood normal.        Behavior: Behavior normal.        Thought Content: Thought content normal.        Judgment: Judgment normal.      No results found for any visits on 12/22/22.  Last CBC Lab Results  Component Value Date   WBC 9.7 09/15/2022   HGB 10.2 (L) 09/15/2022   HCT 29.2 (L) 09/15/2022   MCV 103 (H) 09/15/2022   MCH 35.9 (H) 09/15/2022   RDW 17.0 (H) 09/15/2022   PLT 761 (H) 09/15/2022   Last metabolic panel Lab Results  Component Value Date   GLUCOSE 101 (H) 09/15/2022   NA 140 09/15/2022   K 4.6 09/15/2022   CL 105 09/15/2022   CO2 20 09/15/2022   BUN 7 09/15/2022   CREATININE 0.71 (L) 09/15/2022   EGFR 122 09/15/2022   CALCIUM 9.4 09/15/2022   PROT 7.1 09/15/2022   ALBUMIN 4.4 09/15/2022   LABGLOB 2.7 09/15/2022   AGRATIO 1.6 09/15/2022   BILITOT 0.7 09/15/2022   ALKPHOS 58 09/15/2022   AST 26 09/15/2022   ALT 20 09/15/2022   ANIONGAP 10 07/31/2020   Last lipids Lab Results  Component Value Date   CHOL 136 04/10/2021   HDL 31 (L) 04/10/2021   LDLCALC 84  04/10/2021   TRIG 111 04/10/2021   CHOLHDL 4.4 04/10/2021   Last hemoglobin A1c Lab Results  Component Value Date   HGBA1C <4.0 09/07/2013   Last thyroid functions Lab Results  Component Value Date   TSH 1.070 07/11/2021   Last vitamin D Lab Results  Component Value Date   VD25OH 57.2 09/15/2022   Last vitamin B12 and Folate Lab Results  Component Value Date   FOLATE 10.0 12/16/2021      The ASCVD Risk score (Arnett DK, et al., 2019) failed to calculate for the following reasons:   The 2019 ASCVD risk score is only valid for ages 79 to 81    Assessment & Plan:   Problem List Items Addressed This Visit       Other   Vitamin D deficiency   Relevant Orders   Sickle Cell Panel   Sickle cell disease, type SS (HCC)   Relevant Medications   ibuprofen (ADVIL) 800 MG tablet   Hb-SS disease without crisis (HCC) - Primary   Relevant Medications   oxyCODONE-acetaminophen (PERCOCET) 10-325 MG tablet   Other Relevant Orders   Sickle Cell Panel   Other Visit Diagnoses     Chronic pain syndrome       Relevant Medications   oxyCODONE-acetaminophen (PERCOCET) 10-325 MG tablet   ibuprofen (ADVIL) 800 MG tablet   Other Relevant Orders   [id number] 11+Oxyco+Alc+Crt-Bund      1. Hb-SS disease without crisis (HCC) Trial of Oxbryta for greater control  of sickle cell disease.  Provided written information. - oxyCODONE-acetaminophen (PERCOCET) 10-325 MG tablet; Take 1 tablet by mouth every 6 (six) hours as needed for up to 15 days for pain.  Dispense: 60 tablet; Refill: 0 - Sickle Cell Panel - voxelotor (OXBRYTA) 500 MG TABS tablet; Take 1,500 mg by mouth daily.  Dispense: 90 tablet; Refill: 2  2. Chronic pain syndrome Reviewed PDMP substance reporting system prior to prescribing opiate medications. No inconsistencies noted.   - oxyCODONE-acetaminophen (PERCOCET) 10-325 MG tablet; Take 1 tablet by mouth every 6 (six) hours as needed for up to 15 days for pain.  Dispense: 60  tablet; Refill: 0 - [id number] 11+Oxyco+Alc+Crt-Bund  3. Sickle cell disease, type SS (HCC)  - ibuprofen (ADVIL) 800 MG tablet; Take 1 tablet (800 mg total) by mouth every 8 (eight) hours as needed for mild pain.  Dispense: 90 tablet; Refill: 0  4. Vitamin D deficiency  - Sickle Cell Panel  Return in about 3 months (around 03/24/2023) for sickle cell anemia.     Nolon Nations  APRN, MSN, FNP-C [facility] [address] [place], [place] [address] [phone]

## 2022-12-23 LAB — CMP14+CBC/D/PLT+FER+RETIC+V...
ALT: 12 IU/L (ref 0–44)
AST: 16 IU/L (ref 0–40)
Albumin: 5.1 g/dL (ref 4.1–5.1)
Alkaline Phosphatase: 73 IU/L (ref 44–121)
BUN/Creatinine Ratio: 10 (ref 9–20)
BUN: 7 mg/dL (ref 6–20)
Basophils Absolute: 0.1 10*3/uL (ref 0.0–0.2)
Basos: 1 %
Bilirubin Total: 0.6 mg/dL (ref 0.0–1.2)
CO2: 21 mmol/L (ref 20–29)
Calcium: 9.8 mg/dL (ref 8.7–10.2)
Chloride: 103 mmol/L (ref 96–106)
Creatinine, Ser: 0.68 mg/dL — ABNORMAL LOW (ref 0.76–1.27)
EOS (ABSOLUTE): 0 10*3/uL (ref 0.0–0.4)
Eos: 0 %
Ferritin: 639 ng/mL — ABNORMAL HIGH (ref 30–400)
Globulin, Total: 3.3 g/dL (ref 1.5–4.5)
Glucose: 105 mg/dL — ABNORMAL HIGH (ref 70–99)
Hematocrit: 30.1 % — ABNORMAL LOW (ref 37.5–51.0)
Hemoglobin: 10.5 g/dL — ABNORMAL LOW (ref 13.0–17.7)
Immature Grans (Abs): 0 10*3/uL (ref 0.0–0.1)
Immature Granulocytes: 0 %
Lymphocytes Absolute: 2.7 10*3/uL (ref 0.7–3.1)
Lymphs: 21 %
MCH: 35 pg — ABNORMAL HIGH (ref 26.6–33.0)
MCHC: 34.9 g/dL (ref 31.5–35.7)
MCV: 100 fL — ABNORMAL HIGH (ref 79–97)
Monocytes Absolute: 0.7 10*3/uL (ref 0.1–0.9)
Monocytes: 6 %
NRBC: 2 % — ABNORMAL HIGH (ref 0–0)
Neutrophils Absolute: 9.2 10*3/uL — ABNORMAL HIGH (ref 1.4–7.0)
Neutrophils: 72 %
Platelets: 264 10*3/uL (ref 150–450)
Potassium: 4 mmol/L (ref 3.5–5.2)
RBC: 3 x10E6/uL — ABNORMAL LOW (ref 4.14–5.80)
RDW: 17.4 % — ABNORMAL HIGH (ref 11.6–15.4)
Retic Ct Pct: 4.6 % — ABNORMAL HIGH (ref 0.6–2.6)
Sodium: 139 mmol/L (ref 134–144)
Total Protein: 8.4 g/dL (ref 6.0–8.5)
Vit D, 25-Hydroxy: 46.5 ng/mL (ref 30.0–100.0)
WBC: 12.7 10*3/uL — ABNORMAL HIGH (ref 3.4–10.8)
eGFR: 124 mL/min/{1.73_m2} (ref >59)

## 2022-12-24 LAB — DRUG SCREEN 764883 11+OXYCO+ALC+CRT-BUND

## 2022-12-26 LAB — DRUG SCREEN 764883 11+OXYCO+ALC+CRT-BUND
BENZODIAZ UR QL: NEGATIVE ng/mL
Cocaine (Metabolite): NEGATIVE ng/mL
Ethanol: NEGATIVE %
OPIATE SCREEN URINE: NEGATIVE ng/mL
Propoxyphene: NEGATIVE ng/mL
Tramadol: NEGATIVE ng/mL

## 2022-12-26 LAB — CANNABINOID CONFIRMATION, UR: CANNABINOIDS: NEGATIVE

## 2022-12-29 ENCOUNTER — Telehealth (HOSPITAL_COMMUNITY): Payer: Self-pay | Admitting: *Deleted

## 2022-12-29 NOTE — Telephone Encounter (Signed)
Notified that patient had requested urgent medication management discussion. I called patient to identify if there were any specific needs I would be available to help with. Patient reported that he was putting in for an appeal for disability and wanted to be started on an antipsychotic as he previously had been on Rexulti and Risperidone but did not like either of those medications. He was last seen psychiatrically evaluated by Hillery Jacks on 11/24/2022 in [facility].  I discussed if he felt he was in an acute crisis or at risk of harming self or others or experiencing any auditory/visual hallucinations that he should go to the [facility] for more immediate care. Patient stated he would prefer if we continued with plan to be seen next on 01/12/23. Patient denied any safety concerns at this time and will be seeing me 01/12/23.   He does mention wanting to see if a later appointment time could be made because he was uncertain if his medicaid transportation or bus would be able to make in time for the appointment.  I stated I would have front desk reach out to him to see if a later time would be available.  Park Pope, MD  PGY3 Psychiatry Resident

## 2022-12-29 NOTE — Telephone Encounter (Signed)
Patient called left a voicemail stating that he has an appointment on 7/23 but needs to speak with someone asap for medication management.

## 2022-12-31 NOTE — Telephone Encounter (Signed)
Called pt and inform message per provider. Gh

## 2023-01-04 ENCOUNTER — Encounter: Payer: Self-pay | Admitting: Family Medicine

## 2023-01-05 ENCOUNTER — Other Ambulatory Visit: Payer: Self-pay | Admitting: Family Medicine

## 2023-01-07 ENCOUNTER — Telehealth: Payer: Self-pay | Admitting: Family Medicine

## 2023-01-07 NOTE — Telephone Encounter (Signed)
Spoke with pt and stated that he believed he removed a medication from his chart that he attempted to refill. I spoke with nurse that informed him the med couldn't be deleted from his end. Informed pt also he could contact his pharmacy to have them send over an E-script to office.

## 2023-01-08 ENCOUNTER — Telehealth: Payer: Self-pay

## 2023-01-08 ENCOUNTER — Other Ambulatory Visit: Payer: Self-pay | Admitting: Family Medicine

## 2023-01-08 ENCOUNTER — Other Ambulatory Visit (HOSPITAL_COMMUNITY): Payer: Self-pay

## 2023-01-08 DIAGNOSIS — G894 Chronic pain syndrome: Secondary | ICD-10-CM

## 2023-01-08 MED ORDER — OXYCODONE-ACETAMINOPHEN 10-325 MG PO TABS
1.0000 | ORAL_TABLET | Freq: Four times a day (QID) | ORAL | 0 refills | Status: DC | PRN
Start: 2023-01-08 — End: 2023-01-23

## 2023-01-08 NOTE — Progress Notes (Signed)
Reviewed PDMP substance reporting system prior to prescribing opiate medications. No inconsistencies noted.  Meds ordered this encounter  Medications   oxyCODONE-acetaminophen (PERCOCET) 10-325 MG tablet    Sig: Take 1 tablet by mouth every 6 (six) hours as needed for pain.    Dispense:  60 tablet    Refill:  0    Order Specific Question:   Supervising Provider    Answer:   JEGEDE, OLUGBEMIGA E [[id number]]   Alejandro Moore Hollis  APRN, MSN, FNP-C [facility] [address]  [place], [place] [address] [phone]  

## 2023-01-08 NOTE — Telephone Encounter (Signed)
Per provider medication sent in to pt pharmacy.

## 2023-01-08 NOTE — Telephone Encounter (Signed)
Pharmacy Patient Advocate Encounter   Received notification from Fax that prior authorization for OXBRYTA is required/requested.   PA started via CoverMyMeds. KEY [id number] . Waiting for clinical questions to populate.

## 2023-01-12 ENCOUNTER — Ambulatory Visit (HOSPITAL_COMMUNITY): Payer: Medicare Other | Admitting: Student

## 2023-01-12 ENCOUNTER — Encounter (HOSPITAL_COMMUNITY): Payer: Medicaid Other | Admitting: Student

## 2023-01-12 DIAGNOSIS — Z0271 Encounter for disability determination: Secondary | ICD-10-CM

## 2023-01-12 DIAGNOSIS — F11920 Opioid use, unspecified with intoxication, uncomplicated: Secondary | ICD-10-CM

## 2023-01-12 DIAGNOSIS — F1994 Other psychoactive substance use, unspecified with psychoactive substance-induced mood disorder: Secondary | ICD-10-CM

## 2023-01-12 NOTE — Progress Notes (Signed)
BH MD Outpatient Progress Note  01/14/2023 9:53 PM Alejandro Soto  MRN:  [id number]  Assessment:  Alejandro Soto presents for follow-up evaluation in-person. Today, 01/14/23, patient presented with slurred speech and slowing. He was somnolent throughout assessment and fell asleep 4 times while talking to him. He had initially asked to be started on medication and I tried to identify for what indication but he was unable to identify any specific mood or psychotic symptoms. He states he was not taking any psychotropics for several months. He did also ask for me to write a letter for disability but there was no psychiatric impairment that would impair him from working.  At this time, I do not believe he exhibits symptoms of paranoid schizophrenia which he reports he was reportedly diagnosed with. No safety concerns. He was intoxicated as he reports taking percocet before arrival. Based on chart review, he does have history of fentanyl use so unclear if he is misusing opiates.    Identifying Information: Alejandro Soto is a 37 y.o. male with a history of sickle cell disease, opiate use disorder who is an established patient with [facility] for medication management.   Plan:  # Opiate Intoxication # Concern for opiate use disorder Interventions: -- Recommend only using opiates as doctor prescribes when patient in sickle cell crisis  Patient was given contact information for behavioral health clinic and was instructed to call 911 for emergencies.   Subjective:  Chief Complaint: No chief complaint on file.   Interval History:  Patient was unable to provide significant information. He had slurred speech. He presented asking for letter for disability appeal and that he needed to get back on medication. Reports he has be "diagnosed with paranoid schizophrenia" and that he has sickle cell which he argues is the reason he needs to be on disability. He briefly mentioned he was  stressed about family member getting sick but was generally disorganized and somnolent during entirety of appointment. He stated he prefers "natural" forms of treatment. He states he has been recently "just stressing" which he thought was possibly due to heat. He denies intoxication but states he took his percocet right before appointment and vaguely states he uses THC. He does admit to 1-2 ppd of tobacco. He denies SI/HI/AVH, and paranoia. He denies safety concerns at this time. I terminated assessment as patient continued to fall asleep and start snoring several times. When walking him out, he stated "Until y'all can help me get that back, I don't trust you guys" (in reference to disability appeal). I stated he should go to see his sickle cell provider given there is no clear indication for disability from a psychiatric standpoint but may need to be evaluated if sickle cell disease is sufficiently impairing to warrant disability. He verbalized understanding.   Visit Diagnosis:    ICD-10-CM   1. Opioid intoxication without complication (HCC)  F11.920       Past Psychiatric History:  Substance Induced Psychosis, Polysubstance Abuse (Cocaine, Fentanyl, Opioids), Depression, Anxiety ~5 Prior Psychiatric Hospitalizations (last [facility] 01/2022), and no history of Suicide Attempts or Self Injurious Behavior  Past Medical History:  Past Medical History:  Diagnosis Date   Insomnia 08/2019   Pneumonia    Sickle cell anemia (HCC)     Past Surgical History:  Procedure Laterality Date   NO PAST SURGERIES      Family Psychiatric History:  Father- Depression, EtOH Abuse No Known Suicides.  Family History:  Family History  Problem  Relation Age of Onset   Cancer Maternal Grandmother        colon    Diabetes Father    Cancer Father 63       Prostate   Asthma Brother     Social History:  Academic/Vocational: not currently working  Social History   Socioeconomic History   Marital status:  Single    Spouse name: Not on file   Number of children: Not on file   Years of education: Not on file   Highest education level: Not on file  Occupational History   Not on file  Tobacco Use   Smoking status: Some Days    Current packs/day: 0.33    Average packs/day: 0.3 packs/day for 5.0 years (1.7 ttl pk-yrs)    Types: Cigarettes   Smokeless tobacco: Never  Vaping Use   Vaping status: Never Used  Substance and Sexual Activity   Alcohol use: No    Alcohol/week: 0.0 standard drinks of alcohol   Drug use: No   Sexual activity: Yes    Birth control/protection: Condom  Other Topics Concern   Not on file  Social History Narrative   Not on file   Social Determinants of Health   Financial Resource Strain: Low Risk  (10/01/2022)   Overall Financial Resource Strain (CARDIA)    Difficulty of Paying Living Expenses: Not hard at all  Food Insecurity: Patient Unable To Answer (10/30/2022)   Received from [facility]   Hunger Vital Sign    Worried About Running Out of Food in the Last Year: Patient unable to answer    Ran Out of Food in the Last Year: Patient unable to answer  Transportation Needs: Unmet Transportation Needs (12/22/2022)   PRAPARE - Administrator, Civil Service (Medical): Yes    Lack of Transportation (Non-Medical): No  Physical Activity: Sufficiently Active (10/01/2022)   Exercise Vital Sign    Days of Exercise per Week: 3 days    Minutes of Exercise per Session: 60 min  Stress: No Stress Concern Present (10/30/2022)   Received from [facility] of Occupational Health - Occupational Stress Questionnaire    Feeling of Stress : Not at all  Social Connections: Unknown (10/30/2022)   [facility] from [facility]   Social Network    Social Network: Not on file    Allergies:  Allergies  Allergen Reactions   Amoxicillin Diarrhea    Did it involve swelling of the face/tongue/throat, SOB, or low BP? No Did it involve sudden or severe  rash/hives, skin peeling, or any reaction on the inside of your mouth or nose? No Did you need to seek medical attention at a hospital or doctor's office? No When did it last happen?      childhood If all above answers are "NO", may proceed with cephalosporin use.     Current Medications: Current Outpatient Medications  Medication Sig Dispense Refill   folic acid (FOLVITE) 1 MG tablet Take 1 tablet (1 mg total) by mouth daily. 30 tablet 11   hydroxyurea (HYDREA) 500 MG capsule Take 3 capsules (1,500 mg total) by mouth daily. May take with food to minimize GI side effects. 270 capsule 3   ibuprofen (ADVIL) 800 MG tablet Take 1 tablet (800 mg total) by mouth every 8 (eight) hours as needed for mild pain. 90 tablet 0   Multiple Vitamin (MULTIVITAMIN) tablet Take 2 tablets by mouth daily.  (Patient not taking: Reported on 12/22/2022)  oxyCODONE-acetaminophen (PERCOCET) 10-325 MG tablet Take 1 tablet by mouth every 6 (six) hours as needed for pain. 60 tablet 0   REXULTI 1 MG TABS  (Patient not taking: Reported on 03/03/2022)     triamcinolone (KENALOG) 0.025 % ointment Apply 1 application topically 2 (two) times daily. (Patient not taking: Reported on 12/22/2022) 30 g 2   Vitamin D, Ergocalciferol, (DRISDOL) 1.25 MG (50000 UNIT) CAPS capsule Take 1 capsule (50,000 Units total) by mouth every 7 (seven) days for 90 doses. 12 capsule 6   voxelotor (OXBRYTA) 500 MG TABS tablet Take 1,500 mg by mouth daily. 90 tablet 2   No current facility-administered medications for this visit.    ROS: Review of Systems  Objective:  Psychiatric Specialty Exam: There were no vitals taken for this visit.There is no height or weight on file to calculate BMI.  General Appearance: Disheveled  Eye Contact:  Poor  Speech:  Slurred  Volume:  Decreased  Mood:  Euthymic  Affect:  Appropriate and Congruent  Thought Content: focused on obtaining disability  Suicidal Thoughts:  No  Homicidal Thoughts:  No  Thought  Process:  Disorganized  Orientation:  Full (Time, Place, and Person)    Memory: Grossly intact   Judgment:  Fair  Insight:  Fair  Concentration:  Concentration: Fair and Attention Span: Fair  Recall: not formally assessed   Fund of Knowledge: Fair  Language: Fair  Psychomotor Activity:  Decreased  Akathisia:  No  AIMS (if indicated): not done  Assets:  Housing Leisure Time Physical Health Resilience Transportation  ADL's:  Intact  Cognition: WNL  Sleep:  Fair   PE: General: well-appearing; no acute distress  Pulm: no increased work of breathing on room air  Strength & Muscle Tone: within normal limits Neuro: no focal neurological deficits observed  Gait & Station: normal  Metabolic Disorder Labs: Lab Results  Component Value Date   HGBA1C <4.0 09/07/2013   MPG NOT CALCULATED 09/07/2013   No results found for: "PROLACTIN" Lab Results  Component Value Date   CHOL 136 04/10/2021   TRIG 111 04/10/2021   HDL 31 (L) 04/10/2021   CHOLHDL 4.4 04/10/2021   LDLCALC 84 04/10/2021   Lab Results  Component Value Date   TSH 1.070 07/11/2021   TSH 0.78 09/11/2016    Therapeutic Level Labs: No results found for: "LITHIUM" No results found for: "VALPROATE" No results found for: "CBMZ"  Screenings:  GAD-7    Flowsheet Row Office Visit from 08/10/2022 in [facility] Office Visit from 05/29/2020 in [facility] Office Visit from 11/01/2017 in [facility]  Total GAD-7 Score 2 0 12      PHQ2-9    Flowsheet Row Office Visit from 12/22/2022 in [facility] Clinical Support from 10/01/2022 in [facility] Office Visit from 09/15/2022 in [facility] Office Visit from 08/10/2022 in [facility] Office Visit from 06/02/2022 in [facility]  PHQ-2 Total Score 0 0 0 1 0  PHQ-9 Total Score -- -- -- 7 --       Flowsheet Row ED from 11/24/2022 in [facility]  C-SSRS RISK CATEGORY No Risk       Collaboration of Care: Collaboration of Care:   Patient/Guardian was advised Release of Information must be obtained prior to any record release in order to collaborate their care with an outside  provider. Patient/Guardian was advised if they have not already done so to contact the registration department to sign all necessary forms in order for [place] to release information regarding their care.   Consent: Patient/Guardian gives verbal consent for treatment and assignment of benefits for services provided during this visit. Patient/Guardian expressed understanding and agreed to proceed.   A total of 30 minutes was spent involved in face to face clinical care, chart review, documentation.   Park Pope, MD 01/14/2023, 9:53 PM

## 2023-01-13 ENCOUNTER — Other Ambulatory Visit (HOSPITAL_COMMUNITY): Payer: Self-pay

## 2023-01-13 NOTE — Telephone Encounter (Signed)
Pharmacy Patient Advocate Encounter  PA submitted to [facility] MEDICARE via CoverMyMeds Key/confirmation #/EOC [facility] Status is pending

## 2023-01-14 ENCOUNTER — Encounter (HOSPITAL_COMMUNITY): Payer: Self-pay | Admitting: Student

## 2023-01-14 DIAGNOSIS — F11920 Opioid use, unspecified with intoxication, uncomplicated: Secondary | ICD-10-CM | POA: Insufficient documentation

## 2023-01-14 NOTE — Telephone Encounter (Signed)
Pharmacy Patient Advocate Encounter   PA required; PA submitted to [facility] Medicaid via CoverMyMeds Key/confirmation #/EOC [facility] Status is pending

## 2023-01-15 NOTE — Addendum Note (Signed)
Addended by: Tia Masker on: 01/15/2023 05:32 PM   Modules accepted: Level of Service

## 2023-01-18 ENCOUNTER — Other Ambulatory Visit (HOSPITAL_COMMUNITY): Payer: Self-pay

## 2023-01-18 NOTE — Telephone Encounter (Signed)
Pharmacy Patient Advocate Encounter  Received notification from [facility] that Prior Authorization for Alejandro Soto has been APPROVED from 01/14/23 to FURTHER NOTICE. Ran test claim, Copay is $4.60.

## 2023-01-22 ENCOUNTER — Other Ambulatory Visit: Payer: Self-pay

## 2023-01-22 DIAGNOSIS — G894 Chronic pain syndrome: Secondary | ICD-10-CM

## 2023-01-22 NOTE — Telephone Encounter (Signed)
Please advise K[facility] 

## 2023-01-23 ENCOUNTER — Other Ambulatory Visit: Payer: Self-pay | Admitting: Family Medicine

## 2023-01-23 DIAGNOSIS — G894 Chronic pain syndrome: Secondary | ICD-10-CM

## 2023-01-23 MED ORDER — OXYCODONE-ACETAMINOPHEN 10-325 MG PO TABS
1.0000 | ORAL_TABLET | Freq: Four times a day (QID) | ORAL | 0 refills | Status: DC | PRN
Start: 2023-01-23 — End: 2023-02-08

## 2023-01-23 NOTE — Progress Notes (Signed)
Reviewed PDMP substance reporting system prior to prescribing opiate medications. No inconsistencies noted.  Meds ordered this encounter  Medications   oxyCODONE-acetaminophen (PERCOCET) 10-325 MG tablet    Sig: Take 1 tablet by mouth every 6 (six) hours as needed for pain.    Dispense:  60 tablet    Refill:  0    Order Specific Question:   Supervising Provider    Answer:   JEGEDE, OLUGBEMIGA E [[id number]]   Benaiah Behan Moore Kadience Macchi  APRN, MSN, FNP-C [facility] [address]  [place], [place] [address] [phone]  

## 2023-02-08 ENCOUNTER — Other Ambulatory Visit: Payer: Self-pay

## 2023-02-08 DIAGNOSIS — G894 Chronic pain syndrome: Secondary | ICD-10-CM

## 2023-02-08 MED ORDER — OXYCODONE-ACETAMINOPHEN 10-325 MG PO TABS: 1.0000 | ORAL_TABLET | Freq: Four times a day (QID) | ORAL | 0 refills | Status: DC | PRN

## 2023-02-08 NOTE — Telephone Encounter (Signed)
Please advise K[facility] 

## 2023-02-23 ENCOUNTER — Other Ambulatory Visit: Payer: Self-pay | Admitting: Family Medicine

## 2023-02-23 DIAGNOSIS — G894 Chronic pain syndrome: Secondary | ICD-10-CM

## 2023-02-23 MED ORDER — OXYCODONE-ACETAMINOPHEN 10-325 MG PO TABS
1.0000 | ORAL_TABLET | Freq: Four times a day (QID) | ORAL | 0 refills | Status: DC | PRN
Start: 2023-02-23 — End: 2023-03-09

## 2023-02-23 NOTE — Progress Notes (Signed)
Reviewed PDMP substance reporting system prior to prescribing opiate medications. No inconsistencies noted.  Meds ordered this encounter  Medications   oxyCODONE-acetaminophen (PERCOCET) 10-325 MG tablet    Sig: Take 1 tablet by mouth every 6 (six) hours as needed for pain.    Dispense:  60 tablet    Refill:  0    Order Specific Question:   Supervising Provider    Answer:   JEGEDE, OLUGBEMIGA E [[id number]]   Lachina Moore Hollis  APRN, MSN, FNP-C [facility] [address]  [place], [place] [address] [phone]  

## 2023-03-09 ENCOUNTER — Other Ambulatory Visit: Payer: Self-pay | Admitting: Family Medicine

## 2023-03-09 DIAGNOSIS — G894 Chronic pain syndrome: Secondary | ICD-10-CM

## 2023-03-09 MED ORDER — OXYCODONE-ACETAMINOPHEN 10-325 MG PO TABS
1.0000 | ORAL_TABLET | Freq: Four times a day (QID) | ORAL | 0 refills | Status: AC | PRN
Start: 2023-03-10 — End: ?

## 2023-03-09 NOTE — Telephone Encounter (Signed)
Reviewed PDMP substance reporting system prior to prescribing opiate medications. No inconsistencies noted.   Meds ordered this encounter  Medications  . oxyCODONE-acetaminophen (PERCOCET) 10-325 MG tablet    Sig: Take 1 tablet by mouth every 6 (six) hours as needed for pain.    Dispense:  60 tablet    Refill:  0    Order Specific Question:   Supervising Provider    Answer:   Quentin Angst [[id number]]     Nolon Nations  APRN, MSN, FNP-C [facility] [address] [place], [place] [address] [phone]

## 2023-03-14 NOTE — Progress Notes (Unsigned)
BH MD Outpatient Progress Note  03/16/2023 1:50 PM Alejandro Soto  MRN:  [id number]  Assessment:  Alejandro Soto presents for follow-up evaluation in-person. Today, 03/16/23, patient reports continued stability without need for psychotropics. He reports he was informed he had to be here and is unclear why he needed to follow up. He denies significant mood or psychotic symptoms at this time. Patient reports some stress related to sickle cell pain. He has been without antipsychotics ~10 years and given lack of psychosis, it seems less likely patient has schizophrenia.   Patient to follow up as   Identifying Information: Alejandro Soto is a 37 y.o. male with a history of sickle cell disease, who is an established patient with [facility] for medication management.   Plan: # Reported history of schizophrenia Interventions: -- Recommend only using opiates as doctor prescribes when patient in sickle cell crisis  Patient was given contact information for behavioral health clinic and was instructed to call 911 for emergencies.   Subjective:  Chief Complaint:  Chief Complaint  Patient presents with   Medication Management    Interval History:  Patient presents as follow-up.  He does not know why he is here.  He reports he did not schedule follow-up and that he just showed up on the schedule.  He denies currently experiencing any mood symptoms or psychotic symptoms.  He denies any feelings of paranoia.  He reports sleep and appetite have been appropriate.  He does report sporadic anxiety and depression in the past especially when his father and grandmother had passed away.  He reports occasional sleep disturbance where he will take Tylenol PM but otherwise does not require a regular sleep medication.  He reports spending time sometimes cutting hair, saline herbal medications such as sea moss gel.  He reports previously being on risperidone but it has been approximately 10 years  since he was last on antipsychotic.  Patient is currently staying with mom and feels the past few weeks have been fine.  He has no acute mental health complaints at this time.  He does report some physical complaints of back pain that he reports is related to sickle cell pain.  All questions were addressed and patient is to follow-up as needed with psychiatry.  Patient was provided information regarding 64, behavioral health urgent care, and phone number to front desk should he want to schedule a follow-up with psychiatry.  Visit Diagnosis:  No diagnosis found.   Past Psychiatric History:  Substance Induced Psychosis, Polysubstance Abuse (Cocaine, Fentanyl, Opioids), Depression, Anxiety ~5 Prior Psychiatric Hospitalizations (last [facility] 01/2022), and no history of Suicide Attempts or Self Injurious Behavior  Past Medical History:  Past Medical History:  Diagnosis Date   Insomnia 08/2019   Pneumonia    Sickle cell anemia (HCC)     Past Surgical History:  Procedure Laterality Date   NO PAST SURGERIES      Family Psychiatric History:  Father- Depression, EtOH Abuse No Known Suicides.  Family History:  Family History  Problem Relation Age of Onset   Cancer Maternal Grandmother        colon    Diabetes Father    Cancer Father 36       Prostate   Asthma Brother     Social History:  Academic/Vocational: not currently working  Social History   Socioeconomic History   Marital status: Single    Spouse name: Not on file   Number of children: Not on file  Years of education: Not on file   Highest education level: Not on file  Occupational History   Not on file  Tobacco Use   Smoking status: Some Days    Current packs/day: 0.33    Average packs/day: 0.3 packs/day for 5.0 years (1.7 ttl pk-yrs)    Types: Cigarettes   Smokeless tobacco: Never  Vaping Use   Vaping status: Never Used  Substance and Sexual Activity   Alcohol use: No    Alcohol/week: 0.0 standard drinks of  alcohol   Drug use: No   Sexual activity: Yes    Birth control/protection: Condom  Other Topics Concern   Not on file  Social History Narrative   Not on file   Social Determinants of Health   Financial Resource Strain: Low Risk  (10/01/2022)   Overall Financial Resource Strain (CARDIA)    Difficulty of Paying Living Expenses: Not hard at all  Food Insecurity: Patient Unable To Answer (10/30/2022)   Received from [facility], [facility]   Hunger Vital Sign    Worried About Running Out of Food in the Last Year: Patient unable to answer    Ran Out of Food in the Last Year: Patient unable to answer  Transportation Needs: Unmet Transportation Needs (12/22/2022)   PRAPARE - Administrator, Civil Service (Medical): Yes    Lack of Transportation (Non-Medical): No  Physical Activity: Sufficiently Active (10/01/2022)   Exercise Vital Sign    Days of Exercise per Week: 3 days    Minutes of Exercise per Session: 60 min  Stress: No Stress Concern Present (10/30/2022)   Received from [facility], [facility] Occupational Health - Occupational Stress Questionnaire    Feeling of Stress : Not at all  Social Connections: Unknown (10/30/2022)   [facility] from [facility], [facility]   Social Network    Social Network: Not on file    Allergies:  Allergies  Allergen Reactions   Amoxicillin Diarrhea    Did it involve swelling of the face/tongue/throat, SOB, or low BP? No Did it involve sudden or severe rash/hives, skin peeling, or any reaction on the inside of your mouth or nose? No Did you need to seek medical attention at a hospital or doctor's office? No When did it last happen?      childhood If all above answers are "NO", may proceed with cephalosporin use.     Current Medications: Current Outpatient Medications  Medication Sig Dispense Refill   folic acid (FOLVITE) 1 MG tablet Take 1 tablet (1 mg total) by mouth daily. 30 tablet 11    hydroxyurea (HYDREA) 500 MG capsule Take 3 capsules (1,500 mg total) by mouth daily. May take with food to minimize GI side effects. 270 capsule 3   ibuprofen (ADVIL) 800 MG tablet Take 1 tablet (800 mg total) by mouth every 8 (eight) hours as needed for mild pain. 90 tablet 0   Multiple Vitamin (MULTIVITAMIN) tablet Take 2 tablets by mouth daily.  (Patient not taking: Reported on 12/22/2022)     oxyCODONE-acetaminophen (PERCOCET) 10-325 MG tablet Take 1 tablet by mouth every 6 (six) hours as needed for pain. 60 tablet 0   REXULTI 1 MG TABS  (Patient not taking: Reported on 03/03/2022)     triamcinolone (KENALOG) 0.025 % ointment Apply 1 application topically 2 (two) times daily. (Patient not taking: Reported on 12/22/2022) 30 g 2   Vitamin D, Ergocalciferol, (DRISDOL) 1.25 MG (50000 UNIT) CAPS capsule  Take 1 capsule (50,000 Units total) by mouth every 7 (seven) days for 90 doses. 12 capsule 6   voxelotor (OXBRYTA) 500 MG TABS tablet Take 1,500 mg by mouth daily. 90 tablet 2   No current facility-administered medications for this visit.    ROS: Review of Systems  Objective:  Psychiatric Specialty Exam: There were no vitals taken for this visit.There is no height or weight on file to calculate BMI.  General Appearance: Casual and Fairly Groomed  Eye Contact:  Fair  Speech:  Clear and Coherent and Normal Rate  Volume:  Decreased  Mood:  Euthymic  Affect:  Guarded  Thought Content: focused on obtaining disability  Suicidal Thoughts:  No  Homicidal Thoughts:  No  Thought Process:  Disorganized  Orientation:  Full (Time, Place, and Person)    Memory: Grossly intact   Judgment:  Fair  Insight:  Fair  Concentration:  Concentration: Fair and Attention Span: Fair  Recall: not formally assessed   Fund of Knowledge: Fair  Language: Fair  Psychomotor Activity:  Decreased  Akathisia:  No  AIMS (if indicated): not done  Assets:  Housing Leisure Time Physical Health Resilience Transportation   ADL's:  Intact  Cognition: WNL  Sleep:  Fair   PE: General: well-appearing; no acute distress  Pulm: no increased work of breathing on room air  Strength & Muscle Tone: within normal limits Neuro: no focal neurological deficits observed  Gait & Station: normal  Metabolic Disorder Labs: Lab Results  Component Value Date   HGBA1C <4.0 09/07/2013   MPG NOT CALCULATED 09/07/2013   No results found for: "PROLACTIN" Lab Results  Component Value Date   CHOL 136 04/10/2021   TRIG 111 04/10/2021   HDL 31 (L) 04/10/2021   CHOLHDL 4.4 04/10/2021   LDLCALC 84 04/10/2021   Lab Results  Component Value Date   TSH 1.070 07/11/2021   TSH 0.78 09/11/2016    Therapeutic Level Labs: No results found for: "LITHIUM" No results found for: "VALPROATE" No results found for: "CBMZ"  Screenings:  GAD-7    Flowsheet Row Office Visit from 08/10/2022 in [facility] Office Visit from 05/29/2020 in [facility] Office Visit from 11/01/2017 in [facility]  Total GAD-7 Score 2 0 12      PHQ2-9    Flowsheet Row Office Visit from 12/22/2022 in [facility] Clinical Support from 10/01/2022 in [facility] Office Visit from 09/15/2022 in [facility] Office Visit from 08/10/2022 in [facility] Office Visit from 06/02/2022 in [facility]  PHQ-2 Total Score 0 0 0 1 0  PHQ-9 Total Score -- -- -- 7 --      Flowsheet Row ED from 11/24/2022 in [facility]  C-SSRS RISK CATEGORY No Risk       Collaboration of Care: Collaboration of Care:   Patient/Guardian was advised Release of Information must be obtained prior to any record release in order to collaborate their care with an outside provider. Patient/Guardian was advised if they have not already done so to contact the registration department to sign all  necessary forms in order for [place] to release information regarding their care.   Consent: Patient/Guardian gives verbal consent for treatment and assignment of benefits for services provided during this visit. Patient/Guardian expressed understanding and agreed to proceed.   A total of 30 minutes was spent  involved in face to face clinical care, chart review, documentation.   Park Pope, MD 03/16/2023, 1:50 PM

## 2023-03-16 ENCOUNTER — Ambulatory Visit (INDEPENDENT_AMBULATORY_CARE_PROVIDER_SITE_OTHER): Payer: Medicare Other | Admitting: Student

## 2023-03-16 DIAGNOSIS — Z8659 Personal history of other mental and behavioral disorders: Secondary | ICD-10-CM

## 2023-03-19 ENCOUNTER — Telehealth: Payer: Self-pay | Admitting: Family Medicine

## 2023-03-19 NOTE — Telephone Encounter (Signed)
LVM for patient regarding the following message sent via Mychart. Requested patient call [facility] to discuss Oxbryta.  Dear Patients,  Voxelotor/Oxbryta has been removed from the market. We are shocked and saddened too and will let you know as we learn more about this. Please do not stop your medication right away, we would like you to taper this over 5-7 days, and we will recheck your blood work and see you soon!   Please expect a call from [place] to discuss other available options for managing sickle cell anemia.   Your care team at the [facility]

## 2023-03-23 ENCOUNTER — Other Ambulatory Visit: Payer: Self-pay | Admitting: Family Medicine

## 2023-03-23 ENCOUNTER — Ambulatory Visit (INDEPENDENT_AMBULATORY_CARE_PROVIDER_SITE_OTHER): Payer: Medicare Other | Admitting: Family Medicine

## 2023-03-23 ENCOUNTER — Encounter: Payer: Self-pay | Admitting: Family Medicine

## 2023-03-23 VITALS — BP 125/77 | HR 58 | Temp 97.1°F | Wt 169.0 lb

## 2023-03-23 DIAGNOSIS — D571 Sickle-cell disease without crisis: Secondary | ICD-10-CM | POA: Diagnosis not present

## 2023-03-23 DIAGNOSIS — E559 Vitamin D deficiency, unspecified: Secondary | ICD-10-CM

## 2023-03-23 DIAGNOSIS — G894 Chronic pain syndrome: Secondary | ICD-10-CM

## 2023-03-23 MED ORDER — OXYCODONE-ACETAMINOPHEN 10-325 MG PO TABS
1.0000 | ORAL_TABLET | Freq: Four times a day (QID) | ORAL | 0 refills | Status: DC | PRN
Start: 2023-03-25 — End: 2023-04-07

## 2023-03-23 NOTE — Telephone Encounter (Signed)
Reviewed PDMP substance reporting system prior to prescribing opiate medications. No inconsistencies noted.  Meds ordered this encounter  Medications   oxyCODONE-acetaminophen (PERCOCET) 10-325 MG tablet    Sig: Take 1 tablet by mouth every 6 (six) hours as needed for pain.    Dispense:  60 tablet    Refill:  0    Order Specific Question:   Supervising Provider    Answer:   JEGEDE, OLUGBEMIGA E [[id number]]   Alejandro Cybulski Moore Alani Lacivita  APRN, MSN, FNP-C [facility] [address]  [place], [place] [address] [phone]  

## 2023-03-23 NOTE — Progress Notes (Signed)
Established Patient Office Visit  Subjective   Patient ID: Alejandro Soto, male    DOB: Jun 21, 1986  Age: 37 y.o. MRN: [id number]  Chief Complaint  Patient presents with   Sickle Cell Anemia    Alejandro Soto is a 37 year old male with a medical history significant for sickle cell disease, chronic pain syndrome, opiate dependence and tolerance, and anemia of chronic disease presents for follow-up of his chronic conditions.  Patient states that he is doing well and is without complaint on today.  He has a long history of chronic pain syndrome associated with his sickle cell disease.  Pain is primarily to his low back and lower extremities.  His pain has been mostly controlled on current opiate medication regimen.  He last had Percocet 10-325 mg on last night with moderate relief.  He currently rates his pain as 6/10.  He denies any fever, chills, chest pain, or shortness of breath.  No urinary symptoms, nausea, vomiting, or diarrhea.    Patient Active Problem List   Diagnosis Date Noted   Opioid intoxication without complication (HCC) 01/14/2023   Opioid dependence, uncomplicated (HCC) 07/11/2021   Insomnia 03/23/2019   Chronic prescription opiate use 03/23/2019   Methadone use 08/12/2015   Chronic back pain greater than 3 months duration 04/09/2015   Abdominal pain, epigastric 12/24/2014   Sickle cell pain crisis (HCC) 12/16/2014   Thrombocytosis (HCC) 07/14/2014   Anemia 07/14/2014   Sore throat 07/03/2014   Sebaceous cyst 07/03/2014   Atopic dermatitis 06/12/2014   Leukocytosis 05/02/2014   Tobacco dependence 04/25/2014   Neck pain, bilateral 03/01/2014   Hb-SS disease without crisis (HCC) 02/22/2014   DJD (degenerative joint disease), lumbosacral 09/18/2013   Neuropathic pain 09/18/2013   Back pain, acute 08/11/2013   Opiate use 05/24/2013   Back pain 11/01/2012   Pain in joint, shoulder region 11/01/2012   Pain in joint, lower leg 11/01/2012   Vitamin D deficiency 04/21/2012    Encounter for therapeutic drug monitoring 04/21/2012   Paranoid schizophrenia (HCC) 04/21/2012   Priapism 04/21/2012   Smoking 04/21/2012   Intermittent pain 01/20/2012   Sickle cell disease, type SS (HCC) 01/20/2012   Past Medical History:  Diagnosis Date   Insomnia 08/2019   Pneumonia    Sickle cell anemia (HCC)    Past Surgical History:  Procedure Laterality Date   NO PAST SURGERIES     Social History   Tobacco Use   Smoking status: Some Days    Current packs/day: 0.33    Average packs/day: 0.3 packs/day for 5.0 years (1.7 ttl pk-yrs)    Types: Cigarettes   Smokeless tobacco: Never  Vaping Use   Vaping status: Never Used  Substance Use Topics   Alcohol use: No    Alcohol/week: 0.0 standard drinks of alcohol   Drug use: No   Social History   Socioeconomic History   Marital status: Single    Spouse name: Not on file   Number of children: Not on file   Years of education: Not on file   Highest education level: Associate degree: occupational, Scientist, product/process development, or vocational program  Occupational History   Not on file  Tobacco Use   Smoking status: Some Days    Current packs/day: 0.33    Average packs/day: 0.3 packs/day for 5.0 years (1.7 ttl pk-yrs)    Types: Cigarettes   Smokeless tobacco: Never  Vaping Use   Vaping status: Never Used  Substance and Sexual Activity   Alcohol use:  No    Alcohol/week: 0.0 standard drinks of alcohol   Drug use: No   Sexual activity: Yes    Birth control/protection: Condom  Other Topics Concern   Not on file  Social History Narrative   Not on file   Social Determinants of Health   Financial Resource Strain: Medium Risk (03/19/2023)   Overall Financial Resource Strain (CARDIA)    Difficulty of Paying Living Expenses: Somewhat hard  Food Insecurity: No Food Insecurity (03/19/2023)   Hunger Vital Sign    Worried About Running Out of Food in the Last Year: Never true    Ran Out of Food in the Last Year: Never true   Transportation Needs: No Transportation Needs (03/19/2023)   PRAPARE - Administrator, Civil Service (Medical): No    Lack of Transportation (Non-Medical): No  Recent Concern: Transportation Needs - Unmet Transportation Needs (12/22/2022)   PRAPARE - Administrator, Civil Service (Medical): Yes    Lack of Transportation (Non-Medical): No  Physical Activity: Sufficiently Active (03/19/2023)   Exercise Vital Sign    Days of Exercise per Week: 5 days    Minutes of Exercise per Session: 30 min  Stress: No Stress Concern Present (03/19/2023)   [employer] of Occupational Health - Occupational Stress Questionnaire    Feeling of Stress : Not at all  Social Connections: Moderately Integrated (03/19/2023)   Social Connection and Isolation Panel [NHANES]    Frequency of Communication with Friends and Family: More than three times a week    Frequency of Social Gatherings with Friends and Family: Once a week    Attends Religious Services: More than 4 times per year    Active Member of [employer] or Organizations: Yes    Attends Engineer, structural: More than 4 times per year    Marital Status: Never married  Intimate Partner Violence: Not At Risk (10/30/2022)   Received from [employer], [facility]   HITS    Over the last 12 months how often did your partner physically hurt you?: 1    Over the last 12 months how often did your partner insult you or talk down to you?: 1    Over the last 12 months how often did your partner threaten you with physical harm?: 1    Over the last 12 months how often did your partner scream or curse at you?: 1   Family Status  Relation Name Status   MGM  Deceased   Father  (Not Specified)   Brother  (Not Specified)  No partnership data on file   Family History  Problem Relation Age of Onset   Cancer Maternal Grandmother        colon    Diabetes Father    Cancer Father 10       Prostate   Asthma Brother    Allergies   Allergen Reactions   Amoxicillin Diarrhea    Did it involve swelling of the face/tongue/throat, SOB, or low BP? No Did it involve sudden or severe rash/hives, skin peeling, or any reaction on the inside of your mouth or nose? No Did you need to seek medical attention at a hospital or doctor's office? No When did it last happen?      childhood If all above answers are "NO", may proceed with cephalosporin use.       Review of Systems  Constitutional:  Negative for chills and fever.  HENT: Negative.    Respiratory:  Negative.    Cardiovascular: Negative.   Gastrointestinal: Negative.   Genitourinary: Negative.   Musculoskeletal:  Positive for back pain and joint pain.  Skin: Negative.   Neurological: Negative.   Psychiatric/Behavioral: Negative.        Objective:     BP 125/77   Pulse (!) 58   Temp (!) 97.1 F (36.2 C)   Wt 169 lb (76.7 kg)   SpO2 100%   BMI 25.70 kg/m  BP Readings from Last 3 Encounters:  03/23/23 125/77  12/22/22 128/85  09/15/22 111/66   Wt Readings from Last 3 Encounters:  03/23/23 169 lb (76.7 kg)  12/22/22 161 lb 12.8 oz (73.4 kg)  10/01/22 194 lb (88 kg)      Physical Exam Constitutional:      Appearance: Normal appearance.  Cardiovascular:     Rate and Rhythm: Normal rate and regular rhythm.  Abdominal:     General: Bowel sounds are normal.     Palpations: Abdomen is soft.  Musculoskeletal:        General: Normal range of motion.  Skin:    General: Skin is warm.  Neurological:     General: No focal deficit present.     Mental Status: He is alert. Mental status is at baseline.  Psychiatric:        Mood and Affect: Mood normal.        Behavior: Behavior normal.        Thought Content: Thought content normal.        Judgment: Judgment normal.      Results for orders placed or performed in visit on 03/23/23  Sickle Cell Panel  Result Value Ref Range   Glucose CANCELED mg/dL   BUN CANCELED mg/dL   Creatinine, Ser CANCELED  mg/dL   BUN/Creatinine Ratio CANCELED    Sodium CANCELED mmol/L   Potassium CANCELED mmol/L   Chloride CANCELED mmol/L   CO2 CANCELED mmol/L   Calcium CANCELED mg/dL   Total Protein CANCELED g/dL   Albumin CANCELED g/dL   Globulin, Total CANCELED g/dL   Bilirubin Total CANCELED mg/dL   Alkaline Phosphatase CANCELED IU/L   AST CANCELED IU/L   ALT CANCELED IU/L   Ferritin CANCELED ng/mL   Vit D, 25-Hydroxy CANCELED ng/mL   WBC 11.3 (H) 3.4 - 10.8 x10E3/uL   RBC 3.02 (L) 4.14 - 5.80 x10E6/uL   Hemoglobin 10.7 (L) 13.0 - 17.7 g/dL   Hematocrit 43.3 (L) 29.5 - 51.0 %   MCV 104 (H) 79 - 97 fL   MCH 35.4 (H) 26.6 - 33.0 pg   MCHC 34.2 31.5 - 35.7 g/dL   RDW 18.8 (H) 41.6 - 60.6 %   Platelets 829 (HH) 150 - 450 x10E3/uL   Neutrophils 51 Not Estab. %   Lymphs 43 Not Estab. %   Monocytes 4 Not Estab. %   Eos 1 Not Estab. %   Basos 1 Not Estab. %   Neutrophils Absolute 5.8 1.4 - 7.0 x10E3/uL   Lymphocytes Absolute 4.8 (H) 0.7 - 3.1 x10E3/uL   Monocytes Absolute 0.5 0.1 - 0.9 x10E3/uL   EOS (ABSOLUTE) 0.1 0.0 - 0.4 x10E3/uL   Basophils Absolute 0.1 0.0 - 0.2 x10E3/uL   Immature Granulocytes 0 Not Estab. %   Immature Grans (Abs) 0.0 0.0 - 0.1 x10E3/uL   NRBC 2 (H) 0 - 0 %   Retic Ct Pct 3.9 (H) 0.6 - 2.6 %  [id number] 11+Oxyco+Alc+Crt-Bund  Result Value Ref Range   Ethanol Negative  Cutoff=0.020 %   Amphetamines, Urine Negative Cutoff=1000 ng/mL   Barbiturate Negative Cutoff=200 ng/mL   BENZODIAZ UR QL Negative Cutoff=200 ng/mL   Cannabinoid Quant, Ur Negative Cutoff=50 ng/mL   Cocaine (Metabolite) Negative Cutoff=300 ng/mL   OPIATE SCREEN URINE Negative Cutoff=300 ng/mL   Oxycodone/Oxymorphone, Urine See Final Results Cutoff=300 ng/mL   Phencyclidine Negative Cutoff=25 ng/mL   Methadone Screen, Urine Negative Cutoff=300 ng/mL   Propoxyphene Negative Cutoff=300 ng/mL   Meperidine Negative Cutoff=200 ng/mL   Tramadol Negative Cutoff=200 ng/mL   Creatinine 21.5 20.0 - 300.0 mg/dL    pH, Urine 6.2 4.5 - 8.9  Oxycodone/Oxymorphone, Confirm  Result Value Ref Range   OXYCODONE/OXYMORPH Positive (A) Cutoff=300   OXYCODONE Positive (A)    OXYCODONE 1,244 Cutoff=300 ng/mL   OXYMORPHONE Positive (A)    OXYMORPHONE (GC/MS) 658 Cutoff=300 ng/mL    Last CBC Lab Results  Component Value Date   WBC 11.3 (H) 03/23/2023   HGB 10.7 (L) 03/23/2023   HCT 31.3 (L) 03/23/2023   MCV 104 (H) 03/23/2023   MCH 35.4 (H) 03/23/2023   RDW 15.8 (H) 03/23/2023   PLT 829 (HH) 03/23/2023   Last metabolic panel Lab Results  Component Value Date   GLUCOSE CANCELED 03/23/2023   NA CANCELED 03/23/2023   K CANCELED 03/23/2023   CL CANCELED 03/23/2023   CO2 CANCELED 03/23/2023   BUN CANCELED 03/23/2023   CREATININE CANCELED 03/23/2023   EGFR 124 12/22/2022   CALCIUM CANCELED 03/23/2023   PROT CANCELED 03/23/2023   ALBUMIN CANCELED 03/23/2023   LABGLOB CANCELED 03/23/2023   AGRATIO 1.6 09/15/2022   BILITOT CANCELED 03/23/2023   ALKPHOS CANCELED 03/23/2023   AST CANCELED 03/23/2023   ALT CANCELED 03/23/2023   ANIONGAP 10 07/31/2020   Last lipids Lab Results  Component Value Date   CHOL 136 04/10/2021   HDL 31 (L) 04/10/2021   LDLCALC 84 04/10/2021   TRIG 111 04/10/2021   CHOLHDL 4.4 04/10/2021   Last hemoglobin A1c Lab Results  Component Value Date   HGBA1C <4.0 09/07/2013   Last thyroid functions Lab Results  Component Value Date   TSH 1.070 07/11/2021   Last vitamin D Lab Results  Component Value Date   VD25OH CANCELED 03/23/2023   Last vitamin B12 and Folate Lab Results  Component Value Date   FOLATE 10.0 12/16/2021      The ASCVD Risk score (Arnett DK, et al., 2019) failed to calculate for the following reasons:   The 2019 ASCVD risk score is only valid for ages 28 to 11    Assessment & Plan:   Problem List Items Addressed This Visit       Other   Vitamin D deficiency   Relevant Orders   Sickle Cell Panel (Completed)   Sickle cell disease,  type SS (HCC)   Hb-SS disease without crisis (HCC) - Primary   Relevant Orders   Sickle Cell Panel (Completed)   [id number] 11+Oxyco+Alc+Crt-Bund (Completed)   Mr. Stickels will continue folic acid 1 mg daily, vitamin D, and hydroxyurea.  No changes to his current medication regimen. Pain has been very well-controlled on current medication regimen, no changes. Discontinued Oxbryta due to recent recall.  Patient did not start this medication prior to recall.   Follow-up in 3 months for medication management  Nolon Nations  APRN, MSN, FNP-C Patient Care [facility] [address] [place], [place] [address] [phone]

## 2023-03-26 LAB — CMP14+CBC/D/PLT+FER+RETIC+V...
Basophils Absolute: 0.1 10*3/uL (ref 0.0–0.2)
Basos: 1 %
EOS (ABSOLUTE): 0.1 10*3/uL (ref 0.0–0.4)
Eos: 1 %
Hematocrit: 31.3 % — ABNORMAL LOW (ref 37.5–51.0)
Hemoglobin: 10.7 g/dL — ABNORMAL LOW (ref 13.0–17.7)
Immature Grans (Abs): 0 10*3/uL (ref 0.0–0.1)
Immature Granulocytes: 0 %
Lymphocytes Absolute: 4.8 10*3/uL — ABNORMAL HIGH (ref 0.7–3.1)
Lymphs: 43 %
MCH: 35.4 pg — ABNORMAL HIGH (ref 26.6–33.0)
MCHC: 34.2 g/dL (ref 31.5–35.7)
MCV: 104 fL — ABNORMAL HIGH (ref 79–97)
Monocytes Absolute: 0.5 10*3/uL (ref 0.1–0.9)
Monocytes: 4 %
NRBC: 2 % — ABNORMAL HIGH (ref 0–0)
Neutrophils Absolute: 5.8 10*3/uL (ref 1.4–7.0)
Neutrophils: 51 %
Platelets: 829 10*3/uL (ref 150–450)
RBC: 3.02 x10E6/uL — ABNORMAL LOW (ref 4.14–5.80)
RDW: 15.8 % — ABNORMAL HIGH (ref 11.6–15.4)
Retic Ct Pct: 3.9 % — ABNORMAL HIGH (ref 0.6–2.6)
WBC: 11.3 10*3/uL — ABNORMAL HIGH (ref 3.4–10.8)

## 2023-03-27 LAB — DRUG SCREEN 764883 11+OXYCO+ALC+CRT-BUND
Amphetamines, Urine: NEGATIVE ng/mL
BENZODIAZ UR QL: NEGATIVE ng/mL
Barbiturate: NEGATIVE ng/mL
Cannabinoid Quant, Ur: NEGATIVE ng/mL
Cocaine (Metabolite): NEGATIVE ng/mL
Creatinine: 21.5 mg/dL (ref 20.0–300.0)
Ethanol: NEGATIVE %
Meperidine: NEGATIVE ng/mL
Methadone Screen, Urine: NEGATIVE ng/mL
OPIATE SCREEN URINE: NEGATIVE ng/mL
Phencyclidine: NEGATIVE ng/mL
Propoxyphene: NEGATIVE ng/mL
Tramadol: NEGATIVE ng/mL
pH, Urine: 6.2 (ref 4.5–8.9)

## 2023-03-27 LAB — OXYCODONE/OXYMORPHONE, CONFIRM
OXYCODONE/OXYMORPH: POSITIVE — AB
OXYCODONE: 1244 ng/mL
OXYCODONE: POSITIVE — AB
OXYMORPHONE (GC/MS): 658 ng/mL
OXYMORPHONE: POSITIVE — AB

## 2023-03-29 ENCOUNTER — Other Ambulatory Visit: Payer: Self-pay | Admitting: Family Medicine

## 2023-03-29 DIAGNOSIS — D571 Sickle-cell disease without crisis: Secondary | ICD-10-CM

## 2023-03-29 MED ORDER — IBUPROFEN 800 MG PO TABS
800.0000 mg | ORAL_TABLET | Freq: Three times a day (TID) | ORAL | 0 refills | Status: DC | PRN
Start: 2023-03-29 — End: 2023-09-14

## 2023-04-07 ENCOUNTER — Other Ambulatory Visit: Payer: Self-pay | Admitting: Family Medicine

## 2023-04-07 ENCOUNTER — Other Ambulatory Visit: Payer: Self-pay

## 2023-04-07 DIAGNOSIS — G894 Chronic pain syndrome: Secondary | ICD-10-CM

## 2023-04-07 MED ORDER — OXYCODONE-ACETAMINOPHEN 10-325 MG PO TABS: 1.0000 | ORAL_TABLET | Freq: Four times a day (QID) | ORAL | 0 refills | Status: DC | PRN

## 2023-04-07 NOTE — Progress Notes (Signed)
Reviewed PDMP substance reporting system prior to prescribing opiate medications. No inconsistencies noted.   Meds ordered this encounter  Medications  . oxyCODONE-acetaminophen (PERCOCET) 10-325 MG tablet    Sig: Take 1 tablet by mouth every 6 (six) hours as needed for pain.    Dispense:  60 tablet    Refill:  0    Order Specific Question:   Supervising Provider    Answer:   Quentin Angst [[id number]]     Nolon Nations  APRN, MSN, FNP-C Patient Care [facility] [address] [place], [place] [address] [phone]

## 2023-04-07 NOTE — Telephone Encounter (Signed)
Please advise K[facility] 

## 2023-04-21 ENCOUNTER — Other Ambulatory Visit: Payer: Self-pay | Admitting: Family Medicine

## 2023-04-21 ENCOUNTER — Other Ambulatory Visit: Payer: Self-pay

## 2023-04-21 DIAGNOSIS — G894 Chronic pain syndrome: Secondary | ICD-10-CM

## 2023-04-21 MED ORDER — OXYCODONE-ACETAMINOPHEN 10-325 MG PO TABS: 1.0000 | ORAL_TABLET | Freq: Four times a day (QID) | ORAL | 0 refills | Status: DC | PRN

## 2023-04-21 NOTE — Progress Notes (Signed)
Reviewed PDMP substance reporting system prior to prescribing opiate medications. No inconsistencies noted.  Meds ordered this encounter  Medications   oxyCODONE-acetaminophen (PERCOCET) 10-325 MG tablet    Sig: Take 1 tablet by mouth every 6 (six) hours as needed for pain.    Dispense:  60 tablet    Refill:  0    Order Specific Question:   Supervising Provider    Answer:   JEGEDE, OLUGBEMIGA E [[id number]]   Alejandro Cybulski Moore Alani Lacivita  APRN, MSN, FNP-C [facility] [address]  [place], [place] [address] [phone]  

## 2023-04-21 NOTE — Telephone Encounter (Signed)
Please advise K[facility] 

## 2023-05-06 ENCOUNTER — Other Ambulatory Visit: Payer: Self-pay

## 2023-05-06 DIAGNOSIS — G894 Chronic pain syndrome: Secondary | ICD-10-CM

## 2023-05-06 NOTE — Telephone Encounter (Signed)
Please advise K[facility] 

## 2023-05-07 MED ORDER — OXYCODONE-ACETAMINOPHEN 10-325 MG PO TABS
1.0000 | ORAL_TABLET | Freq: Four times a day (QID) | ORAL | 0 refills | Status: DC | PRN
Start: 2023-05-09 — End: 2023-05-26

## 2023-05-24 ENCOUNTER — Other Ambulatory Visit: Payer: Self-pay

## 2023-05-24 DIAGNOSIS — G894 Chronic pain syndrome: Secondary | ICD-10-CM

## 2023-05-24 NOTE — Telephone Encounter (Signed)
Please advise K[facility] 

## 2023-05-25 NOTE — Telephone Encounter (Signed)
Copied from CRM [id number]. Topic: Clinical - Medication Refill >> May 25, 2023  9:45 AM Donita Brooks wrote: Most Recent Primary Care Visit:  Provider: Massie Maroon  Department: [facility]  Visit Type: OFFICE VISIT  Date: 03/23/2023  Medication: oxyCODONE-acetaminophen (PERCOCET) 10-325 MG tablet  Has the patient contacted their pharmacy? Yes (Agent: If no, request that the patient contact the pharmacy for the refill. If patient does not wish to contact the pharmacy document the reason why and proceed with request.) (Agent: If yes, when and what did the pharmacy advise?)  Is this the correct pharmacy for this prescription? Yes If no, delete pharmacy and type the correct one.  This is the patient's preferred pharmacy:  [id number] [place] [place] - [address] [facility] [address] [place] [address] Phone: [phone] Fax: [phone]  [facility] [address] [place] - [address] [place] [address] Phone: [phone] Fax: [phone]   Has the prescription been filled recently? No  Is the patient out of the medication? Yes  Has the patient been seen for an appointment in the last year OR does the patient have an upcoming appointment? No  Can we respond through MyChart? Yes  Agent: Please be advised that Rx refills may take up to 3 business days. We ask that you follow-up with your pharmacy.

## 2023-05-26 ENCOUNTER — Other Ambulatory Visit: Payer: Self-pay | Admitting: Family Medicine

## 2023-05-26 ENCOUNTER — Telehealth: Payer: Self-pay | Admitting: Family Medicine

## 2023-05-26 DIAGNOSIS — G894 Chronic pain syndrome: Secondary | ICD-10-CM

## 2023-05-26 MED ORDER — OXYCODONE-ACETAMINOPHEN 10-325 MG PO TABS
1.0000 | ORAL_TABLET | Freq: Four times a day (QID) | ORAL | 0 refills | Status: DC | PRN
Start: 2023-05-26 — End: 2023-06-11

## 2023-05-26 NOTE — Telephone Encounter (Signed)
Copied from CRM [id number]. Topic: Clinical - Prescription Issue >> May 26, 2023  8:26 AM Dennison Nancy wrote: Reason for CRM: Patient called on  05/26/23 checking on status of medication refill for the oxyCODONE-acetaminophen (PERCOCET) 10-325 MG tablet , advised patient it can take 3 business days , patient called the pharmacist  .. Call disconnected before finishing the call.

## 2023-05-26 NOTE — Telephone Encounter (Signed)
Copied from CRM [id number]. Topic: Clinical - Medication Refill >> May 26, 2023  8:28 AM Dollene Primrose wrote: Most Recent Primary Care Visit:  Provider: Massie Maroon  Department: [facility]  Visit Type: OFFICE VISIT  Date: 03/23/2023  Medication: oxyCODONE-acetaminophen (PERCOCET) 10-325 MG tablet  Has the patient contacted their pharmacy? No-c2 (Agent: If no, request that the patient contact the pharmacy for the refill. If patient does not wish to contact the pharmacy document the reason why and proceed with request.) (Agent: If yes, when and what did the pharmacy advise?)  Is this the correct pharmacy for this prescription? yes If no, delete pharmacy and type the correct one.  This is the patient's preferred pharmacy:  [id number] [place], [place] - [address] AT [facility] [address] [place] [address] Phone: [phone] Fax: [phone]     Has the prescription been filled recently? yes  Is the patient out of the medication? yes  Has the patient been seen for an appointment in the last year OR does the patient have an upcoming appointment? yes  Can we respond through MyChart? yes  Agent: Please be advised that Rx refills may take up to 3 business days. We ask that you follow-up with your pharmacy.

## 2023-05-26 NOTE — Progress Notes (Signed)
Reviewed PDMP substance reporting system prior to prescribing opiate medications. No inconsistencies noted.  Meds ordered this encounter  Medications   oxyCODONE-acetaminophen (PERCOCET) 10-325 MG tablet    Sig: Take 1 tablet by mouth every 6 (six) hours as needed for pain.    Dispense:  60 tablet    Refill:  0    Order Specific Question:   Supervising Provider    Answer:   JEGEDE, OLUGBEMIGA E [[id number]]   Alejandro Cybulski Moore Alani Lacivita  APRN, MSN, FNP-C [facility] [address]  [place], [place] [address] [phone]  

## 2023-05-27 NOTE — Telephone Encounter (Signed)
Done KH 

## 2023-06-10 ENCOUNTER — Other Ambulatory Visit: Payer: Self-pay | Admitting: Family Medicine

## 2023-06-10 DIAGNOSIS — G894 Chronic pain syndrome: Secondary | ICD-10-CM

## 2023-06-10 NOTE — Telephone Encounter (Signed)
Copied from CRM [id number]. Topic: Clinical - Medication Refill >> Jun 10, 2023  8:24 AM Nada Libman H wrote: Most Recent Primary Care Visit:  Provider: Massie Maroon  Department: [facility]  Visit Type: OFFICE VISIT  Date: 03/23/2023  Medication: oxyCODONE-acetaminophen (PERCOCET) 10-325 MG tablet  Has the patient contacted their pharmacy? Yes (Agent: If no, request that the patient contact the pharmacy for the refill. If patient does not wish to contact the pharmacy document the reason why and proceed with request.) (Agent: If yes, when and what did the pharmacy advise?)  Is this the correct pharmacy for this prescription? Yes If no, delete pharmacy and type the correct one.  This is the patient's preferred pharmacy:  [id number] [place] - [address] AT [facility] [address] [place] [address] Phone: [phone] Fax: [phone]    Has the prescription been filled recently? Yes  Is the patient out of the medication? Yes  Has the patient been seen for an appointment in the last year OR does the patient have an upcoming appointment? No  Can we respond through MyChart? Yes  Agent: Please be advised that Rx refills may take up to 3 business days. We ask that you follow-up with your pharmacy.

## 2023-06-10 NOTE — Telephone Encounter (Signed)
Please advise K[facility] 

## 2023-06-11 ENCOUNTER — Telehealth: Payer: Self-pay

## 2023-06-11 ENCOUNTER — Other Ambulatory Visit: Payer: Self-pay

## 2023-06-11 DIAGNOSIS — G894 Chronic pain syndrome: Secondary | ICD-10-CM

## 2023-06-11 NOTE — Telephone Encounter (Signed)
Copied from CRM [id number]. Topic: Clinical - Medication Refill >> Jun 11, 2023  8:44 AM Conni Elliot wrote: Most Recent Primary Care Visit:  Provider: Massie Maroon  Department: [facility]  Visit Type: OFFICE VISIT  Date: 03/23/2023  Medication: oxyCODONE-acetaminophen (PERCOCET) 10-325 MG tablet  Has the patient contacted their pharmacy? No (Agent: If no, request that the patient contact the pharmacy for the refill. If patient does not wish to contact the pharmacy document the reason why and proceed with request.) (Agent: If yes, when and what did the pharmacy advise?)  Is this the correct pharmacy for this prescription? Yes If no, delete pharmacy and type the correct one.  This is the patient's preferred pharmacy:  [id number] [place], [place] - [address] AT [facility] [address] [place] [address] Phone: [phone] Fax: [phone]  [facility] [address], [place] - [address] [place] [address] Phone: [phone] Fax: [phone]   Has the prescription been filled recently? Yes  Is the patient out of the medication? Yes  Has the patient been seen for an appointment in the last year OR does the patient have an upcoming appointment? Yes  Can we respond through MyChart? Yes  Agent: Please be advised that Rx refills may take up to 3 business days. We ask that you follow-up with your pharmacy.   Was sent to Dr. Hyman Hopes. Awaiting a reply. KH

## 2023-06-11 NOTE — Telephone Encounter (Signed)
I think I have sent this two you. Please advise .kh

## 2023-06-11 NOTE — Telephone Encounter (Signed)
Copied from CRM [id number]. Topic: Clinical - Medication Refill >> Jun 11, 2023  2:43 PM Thomes Dinning wrote: Most Recent Primary Care Visit:  Provider: Massie Maroon  Department: [facility]  Visit Type: OFFICE VISIT  Date: 03/23/2023  Medication: oxyCODONE-acetaminophen (PERCOCET) 10-325 MG tablet  Has the patient contacted their pharmacy? Yes (Agent: If no, request that the patient contact the pharmacy for the refill. If patient does not wish to contact the pharmacy document the reason why and proceed with request.) (Agent: If yes, when and what did the pharmacy advise?)  Is this the correct pharmacy for this prescription? Yes If no, delete pharmacy and type the correct one.  This is the patient's preferred pharmacy:  [id number] [place] - [address] AT [facility] [address] [place] [address] Phone: [phone] Fax: [phone]  [facility] [address] [place] - [address] [place] [address] Phone: [phone] Fax: [phone]   Has the prescription been filled recently? No  Is the patient out of the medication? Yes  Has the patient been seen for an appointment in the last year OR does the patient have an upcoming appointment? Yes  Can we respond through MyChart? Yes  Agent: Please be advised that Rx refills may take up to 3 business days. We ask that you follow-up with your pharmacy.  Sent to provider. Kh

## 2023-06-14 ENCOUNTER — Telehealth: Payer: Self-pay

## 2023-06-14 MED ORDER — OXYCODONE-ACETAMINOPHEN 10-325 MG PO TABS
1.0000 | ORAL_TABLET | Freq: Four times a day (QID) | ORAL | 0 refills | Status: DC | PRN
Start: 2023-06-14 — End: 2023-06-29

## 2023-06-14 NOTE — Telephone Encounter (Signed)
Copied from CRM [id number]. Topic: Clinical - Prescription Issue >> Jun 14, 2023  9:54 AM Shelah Lewandowsky wrote: Reason for CRM: Patient still waiting for prescription refill on oxyCODONE-acetaminophen (PERCOCET) 10-325 MG tablet   Please call patient if any issues [phone]

## 2023-06-15 ENCOUNTER — Other Ambulatory Visit: Payer: Self-pay | Admitting: Internal Medicine

## 2023-06-17 MED ORDER — OXYCODONE-ACETAMINOPHEN 10-325 MG PO TABS: 1.0000 | ORAL_TABLET | Freq: Four times a day (QID) | ORAL | 0 refills | Status: DC | PRN

## 2023-06-17 NOTE — Telephone Encounter (Signed)
Please advise K[facility] 

## 2023-06-29 ENCOUNTER — Other Ambulatory Visit: Payer: Self-pay | Admitting: Nurse Practitioner

## 2023-06-29 ENCOUNTER — Encounter: Payer: Self-pay | Admitting: Nurse Practitioner

## 2023-06-29 ENCOUNTER — Ambulatory Visit (INDEPENDENT_AMBULATORY_CARE_PROVIDER_SITE_OTHER): Payer: Medicare (Managed Care) | Admitting: Nurse Practitioner

## 2023-06-29 VITALS — BP 99/62 | HR 74 | Temp 97.4°F | Wt 157.0 lb

## 2023-06-29 DIAGNOSIS — G894 Chronic pain syndrome: Secondary | ICD-10-CM | POA: Diagnosis not present

## 2023-06-29 DIAGNOSIS — F17200 Nicotine dependence, unspecified, uncomplicated: Secondary | ICD-10-CM | POA: Diagnosis not present

## 2023-06-29 DIAGNOSIS — E559 Vitamin D deficiency, unspecified: Secondary | ICD-10-CM

## 2023-06-29 DIAGNOSIS — D571 Sickle-cell disease without crisis: Secondary | ICD-10-CM | POA: Diagnosis not present

## 2023-06-29 MED ORDER — NALOXONE HCL 4 MG/0.1ML NA LIQD: NASAL | 1 refills | Status: AC

## 2023-06-29 MED ORDER — FOLIC ACID 1 MG PO TABS: 1.0000 mg | ORAL_TABLET | Freq: Every day | ORAL | 1 refills | Status: DC

## 2023-06-29 MED ORDER — OXYCODONE-ACETAMINOPHEN 10-325 MG PO TABS: 1.0000 | ORAL_TABLET | Freq: Four times a day (QID) | ORAL | 0 refills | Status: DC | PRN

## 2023-06-29 MED ORDER — HYDROXYUREA 500 MG PO CAPS: 1500.0000 mg | ORAL_CAPSULE | Freq: Every day | ORAL | 3 refills | Status: DC

## 2023-06-29 NOTE — Assessment & Plan Note (Signed)
 Sickle cell disease - Continue Hydrea  1500 mg daily. Folic acid  1 mg daily to prevent aplastic bone marrow crises ordered since he now has a new insurance.  Will check sickle cell panel for absolute neutrophil count and platelets. Will also check reticulocyte count.  We discussed the need for good hydration, monitoring of hydration status, avoidance of heat, cold, stress, and infection triggers. . The patient was reminded of the need to seek medical attention of any symptoms of bleeding, anemia, or infection.   Pulmonary evaluation - Patient denies severe recurrent wheezes, shortness of breath with exercise, or persistent cough. If these symptoms develop, pulmonary function tests with spirometry will be ordered, and if abnormal, plan on referral to Pulmonology for further evaluation.    Cardiac-patient denies chest pain  Eye - High risk of proliferative retinopathy. Annual eye exam with retinal exam recommended to patient.    Immunization status - Patient declined flu vaccine today  Acute and chronic painful episodes -     We discussed that pt is to receive her Schedule II prescriptions only from us . Pt is also aware that the prescription history is available to us  online through the [facility] CSRS. We reminded the patient that all patients receiving Schedule II narcotics must be seen for follow within the 3 months in the office.  We reviewed the terms of our pain agreement, including the need to keep medicines in a safe locked location away from children or pets, and the need to report excess sedation  According to the [place] Chronic Pain Initiative program, we have reviewed details related to analgesia, adverse effects, aberrant behaviors. Reviewed [place] Substance Reporting system prior to prescribing opiate medication, no inconsistencies noted.   1. Hb-SS disease without crisis (HCC)  - hydroxyurea  (HYDREA ) 500 MG capsule; Take 3 capsules (1,500 mg total) by mouth daily. May take with food to minimize GI  side effects.  Dispense: 270 capsule; Refill: 3 - Sickle Cell Panel - 235116 11+Oxyco+Alc+Crt-Bund  2. Chronic pain syndrome  - oxyCODONE -acetaminophen  (PERCOCET) 10-325 MG tablet; Take 1 tablet by mouth every 6 (six) hours as needed for pain.  Dispense: 60 tablet; Refill: 0 - naloxone  (NARCAN ) nasal spray 4 mg/0.1 mL; Use 1 nasal spray as a single dose in one nostril; may repeat with a new nasal spray every 2 to 3 minutes in alternating nostrils if needed for oopoidoverdose  Dispense: 1 each; Refill: 1   .

## 2023-06-29 NOTE — Assessment & Plan Note (Signed)
 Smoking cessation encouraged!

## 2023-06-29 NOTE — Assessment & Plan Note (Signed)
 Last vitamin D Lab Results  Component Value Date   VD25OH CANCELED 03/23/2023  Checking vitamin D levels today, he has not been taking vitamin D supplements

## 2023-06-29 NOTE — Progress Notes (Signed)
 New Patient Office Visit  Subjective:  Patient ID: Alejandro Soto, male    DOB: 1985/10/06  Age: 38 y.o. MRN: [id number]  CC:  Chief Complaint  Patient presents with   Sickle Cell Anemia    HPI Alejandro Soto is a 38 y.o. male  has a past medical history of Insomnia (08/2019), Pneumonia, and Sickle cell anemia (HCC).  Patient presents for follow-up for his chronic medical conditions.  Previous patient of Hollis NP  Sickle cell disease.  Currently on hydroxyurea  1500 mg daily, ibuprofen  800 mg 3 times daily as needed mild pain, Percocet 10 325 mg 1 tablet every 6 hours as needed for moderate pain.  Not taking folic acid  because is insurance has not been paying for the medication.  He is currently not seen an hematologist.  Currently has pain rated 5/10 pain is mainly in his joints.  Patient denies fever chills chest pain shortness of breath wheezing.  Stated that he has his yearly eye exam scheduled  Due for flu vaccine patient declined the vaccine was encouraged to consider getting the vaccine  Tobacco use disorder smokes 1 to 2 cigarettes daily, denies shortness of breath cough wheezing        Past Medical History:  Diagnosis Date   Insomnia 08/2019   Pneumonia    Sickle cell anemia (HCC)     Past Surgical History:  Procedure Laterality Date   NO PAST SURGERIES      Family History  Problem Relation Age of Onset   Cancer Maternal Grandmother        colon    Diabetes Father    Cancer Father 41       Prostate   Asthma Brother     Social History   Socioeconomic History   Marital status: Single    Spouse name: Not on file   Number of children: Not on file   Years of education: Not on file   Highest education level: Associate degree: occupational, scientist, product/process development, or vocational program  Occupational History   Not on file  Tobacco Use   Smoking status: Some Days    Current packs/day: 0.33    Average packs/day: 0.3 packs/day for 5.0 years (1.7 ttl pk-yrs)    Types:  Cigarettes   Smokeless tobacco: Never  Vaping Use   Vaping status: Never Used  Substance and Sexual Activity   Alcohol  use: No    Alcohol /week: 0.0 standard drinks of alcohol    Drug use: No   Sexual activity: Yes    Birth control/protection: Condom  Other Topics Concern   Not on file  Social History Narrative   Lives with his mother    Social Drivers of Health   Financial Resource Strain: Medium Risk (03/19/2023)   Overall Financial Resource Strain (CARDIA)    Difficulty of Paying Living Expenses: Somewhat hard  Food Insecurity: No Food Insecurity (03/19/2023)   Hunger Vital Sign    Worried About Running Out of Food in the Last Year: Never true    Ran Out of Food in the Last Year: Never true  Transportation Needs: No Transportation Needs (03/19/2023)   PRAPARE - Administrator, Civil Service (Medical): No    Lack of Transportation (Non-Medical): No  Recent Concern: Transportation Needs - Unmet Transportation Needs (12/22/2022)   PRAPARE - Administrator, Civil Service (Medical): Yes    Lack of Transportation (Non-Medical): No  Physical Activity: Sufficiently Active (03/19/2023)   Exercise Vital Sign  Days of Exercise per Week: 5 days    Minutes of Exercise per Session: 30 min  Stress: No Stress Concern Present (03/19/2023)   [employer] of Occupational Health - Occupational Stress Questionnaire    Feeling of Stress : Not at all  Social Connections: Moderately Integrated (03/19/2023)   Social Connection and Isolation Panel [NHANES]    Frequency of Communication with Friends and Family: More than three times a week    Frequency of Social Gatherings with Friends and Family: Once a week    Attends Religious Services: More than 4 times per year    Active Member of [employer] or Organizations: Yes    Attends Engineer, Structural: More than 4 times per year    Marital Status: Never married  Intimate Partner Violence: Not At Risk (10/30/2022)    Received from [employer], [facility]   HITS    Over the last 12 months how often did your partner physically hurt you?: Never    Over the last 12 months how often did your partner insult you or talk down to you?: Never    Over the last 12 months how often did your partner threaten you with physical harm?: Never    Over the last 12 months how often did your partner scream or curse at you?: Never    ROS Review of Systems  Constitutional:  Negative for appetite change, chills, fatigue and fever.  HENT:  Negative for congestion, postnasal drip, rhinorrhea and sneezing.   Respiratory:  Negative for cough, shortness of breath and wheezing.   Cardiovascular:  Negative for chest pain, palpitations and leg swelling.  Gastrointestinal:  Negative for abdominal pain, constipation, nausea and vomiting.  Genitourinary:  Negative for difficulty urinating, dysuria, flank pain and frequency.  Musculoskeletal:  Negative for arthralgias, back pain, joint swelling and myalgias.  Skin:  Negative for color change, pallor, rash and wound.  Neurological:  Negative for dizziness, facial asymmetry, weakness, numbness and headaches.  Psychiatric/Behavioral:  Negative for behavioral problems, confusion, self-injury and suicidal ideas.     Objective:   Today's Vitals: BP 99/62   Pulse 74   Temp (!) 97.4 F (36.3 C)   Wt 157 lb (71.2 kg)   SpO2 100%   BMI 23.87 kg/m   Physical Exam Vitals and nursing note reviewed.  Constitutional:      General: He is not in acute distress.    Appearance: Normal appearance. He is not ill-appearing, toxic-appearing or diaphoretic.  HENT:     Mouth/Throat:     Mouth: Mucous membranes are moist.     Pharynx: Oropharynx is clear. No oropharyngeal exudate or posterior oropharyngeal erythema.  Eyes:     General: Scleral icterus present.        Right eye: No discharge.        Left eye: No discharge.     Extraocular Movements: Extraocular movements intact.      Conjunctiva/sclera: Conjunctivae normal.  Cardiovascular:     Rate and Rhythm: Normal rate and regular rhythm.     Pulses: Normal pulses.     Heart sounds: Normal heart sounds. No murmur heard.    No friction rub. No gallop.  Pulmonary:     Effort: Pulmonary effort is normal. No respiratory distress.     Breath sounds: Normal breath sounds. No stridor. No wheezing, rhonchi or rales.  Chest:     Chest wall: No tenderness.  Abdominal:     General: There is no distension.  Palpations: Abdomen is soft.     Tenderness: There is no abdominal tenderness. There is no right CVA tenderness, left CVA tenderness or guarding.  Musculoskeletal:        General: No swelling, tenderness, deformity or signs of injury.     Right lower leg: No edema.     Left lower leg: No edema.  Skin:    General: Skin is warm and dry.     Capillary Refill: Capillary refill takes less than 2 seconds.     Coloration: Skin is not jaundiced or pale.     Findings: No bruising, erythema or lesion.  Neurological:     Mental Status: He is alert and oriented to person, place, and time.     Motor: No weakness.     Coordination: Coordination normal.     Gait: Gait normal.  Psychiatric:        Mood and Affect: Mood normal.        Behavior: Behavior normal.        Thought Content: Thought content normal.        Judgment: Judgment normal.     Assessment & Plan:   Problem List Items Addressed This Visit       Other   Vitamin D  deficiency   Last vitamin D  Lab Results  Component Value Date   VD25OH CANCELED 03/23/2023  Checking vitamin D  levels today, he has not been taking vitamin D  supplements      Hb-SS disease without crisis (HCC)   Sickle cell disease - Continue Hydrea  1500 mg daily. Folic acid  1 mg daily to prevent aplastic bone marrow crises ordered since he now has a new insurance.  Will check sickle cell panel for absolute neutrophil count and platelets. Will also check reticulocyte count.  We  discussed the need for good hydration, monitoring of hydration status, avoidance of heat, cold, stress, and infection triggers. . The patient was reminded of the need to seek medical attention of any symptoms of bleeding, anemia, or infection.   Pulmonary evaluation - Patient denies severe recurrent wheezes, shortness of breath with exercise, or persistent cough. If these symptoms develop, pulmonary function tests with spirometry will be ordered, and if abnormal, plan on referral to Pulmonology for further evaluation.    Cardiac-patient denies chest pain  Eye - High risk of proliferative retinopathy. Annual eye exam with retinal exam recommended to patient.    Immunization status - Patient declined flu vaccine today  Acute and chronic painful episodes -     We discussed that pt is to receive her Schedule II prescriptions only from us . Pt is also aware that the prescription history is available to us  online through the [facility] CSRS. We reminded the patient that all patients receiving Schedule II narcotics must be seen for follow within the 3 months in the office.  We reviewed the terms of our pain agreement, including the need to keep medicines in a safe locked location away from children or pets, and the need to report excess sedation  According to the [place] Chronic Pain Initiative program, we have reviewed details related to analgesia, adverse effects, aberrant behaviors. Reviewed  Substance Reporting system prior to prescribing opiate medication, no inconsistencies noted.   1. Hb-SS disease without crisis (HCC)  - hydroxyurea  (HYDREA ) 500 MG capsule; Take 3 capsules (1,500 mg total) by mouth daily. May take with food to minimize GI side effects.  Dispense: 270 capsule; Refill: 3 - Sickle Cell Panel - 235116 11+Oxyco+Alc+Crt-Bund  2.  Chronic pain syndrome  - oxyCODONE -acetaminophen  (PERCOCET) 10-325 MG tablet; Take 1 tablet by mouth every 6 (six) hours as needed for pain.  Dispense: 60 tablet;  Refill: 0 - naloxone  (NARCAN ) nasal spray 4 mg/0.1 mL; Use 1 nasal spray as a single dose in one nostril; may repeat with a new nasal spray every 2 to 3 minutes in alternating nostrils if needed for oopoidoverdose  Dispense: 1 each; Refill: 1   .       Relevant Medications   hydroxyurea  (HYDREA ) 500 MG capsule   folic acid  (FOLVITE ) 1 MG tablet   Other Relevant Orders   Sickle Cell Panel   235116 11+Oxyco+Alc+Crt-Bund   Tobacco use disorder - Primary   Smoking cessation encouraged      Chronic pain syndrome   1. Hb-SS disease without crisis (HCC)  - hydroxyurea  (HYDREA ) 500 MG capsule; Take 3 capsules (1,500 mg total) by mouth daily. May take with food to minimize GI side effects.  Dispense: 270 capsule; Refill: 3 - Sickle Cell Panel - 235116 11+Oxyco+Alc+Crt-Bund  2. Chronic pain syndrome  - oxyCODONE -acetaminophen  (PERCOCET) 10-325 MG tablet; Take 1 tablet by mouth every 6 (six) hours as needed for pain.  Dispense: 60 tablet; Refill: 0 - naloxone  (NARCAN ) nasal spray 4 mg/0.1 mL; Use 1 nasal spray as a single dose in one nostril; may repeat with a new nasal spray every 2 to 3 minutes in alternating nostrils if needed for oopoidoverdose  Dispense: 1 each; Refill: 1       Relevant Medications   oxyCODONE -acetaminophen  (PERCOCET) 10-325 MG tablet   naloxone  (NARCAN ) nasal spray 4 mg/0.1 mL    Outpatient Encounter Medications as of 06/29/2023  Medication Sig   folic acid  (FOLVITE ) 1 MG tablet Take 1 tablet (1 mg total) by mouth daily.   ibuprofen  (ADVIL ) 800 MG tablet Take 1 tablet (800 mg total) by mouth every 8 (eight) hours as needed for mild pain.   naloxone  (NARCAN ) nasal spray 4 mg/0.1 mL Use 1 nasal spray as a single dose in one nostril; may repeat with a new nasal spray every 2 to 3 minutes in alternating nostrils if needed for oopoidoverdose   Nutritional Supplements (EAA SUPPLEMENT PO) Take by mouth.   [DISCONTINUED] oxyCODONE -acetaminophen  (PERCOCET) 10-325 MG tablet  Take 1 tablet by mouth every 6 (six) hours as needed for pain.   [DISCONTINUED] oxyCODONE -acetaminophen  (PERCOCET) 10-325 MG tablet Take 1 tablet by mouth every 6 (six) hours as needed for pain.   hydroxyurea  (HYDREA ) 500 MG capsule Take 3 capsules (1,500 mg total) by mouth daily. May take with food to minimize GI side effects.   oxyCODONE -acetaminophen  (PERCOCET) 10-325 MG tablet Take 1 tablet by mouth every 6 (six) hours as needed for pain.   REXULTI 1 MG TABS  (Patient not taking: Reported on 06/29/2023)   triamcinolone  (KENALOG ) 0.025 % ointment Apply 1 application topically 2 (two) times daily. (Patient not taking: Reported on 06/29/2023)   Vitamin D , Ergocalciferol , (DRISDOL ) 1.25 MG (50000 UNIT) CAPS capsule Take 1 capsule (50,000 Units total) by mouth every 7 (seven) days for 90 doses. (Patient not taking: Reported on 06/29/2023)   [DISCONTINUED] Multiple Vitamin (MULTIVITAMIN) tablet Take 2 tablets by mouth daily.  (Patient not taking: Reported on 12/22/2022)   No facility-administered encounter medications on file as of 06/29/2023.    Follow-up: Return in about 3 months (around 09/27/2023) for sickle cell diasease.   Roshanna Cimino R Tanya Crothers, FNP

## 2023-06-29 NOTE — Patient Instructions (Signed)
 1. Hb-SS disease without crisis (HCC)  - hydroxyurea  (HYDREA ) 500 MG capsule; Take 3 capsules (1,500 mg total) by mouth daily. May take with food to minimize GI side effects.  Dispense: 270 capsule; Refill: 3 - Sickle Cell Panel - 235116 11+Oxyco+Alc+Crt-Bund  2. Chronic pain syndrome  - oxyCODONE -acetaminophen  (PERCOCET) 10-325 MG tablet; Take 1 tablet by mouth every 6 (six) hours as needed for pain.  Dispense: 60 tablet; Refill: 0     Thanks for choosing [facility] we consider it a privelige to serve you.

## 2023-06-29 NOTE — Assessment & Plan Note (Signed)
 1. Hb-SS disease without crisis (HCC)  - hydroxyurea  (HYDREA ) 500 MG capsule; Take 3 capsules (1,500 mg total) by mouth daily. May take with food to minimize GI side effects.  Dispense: 270 capsule; Refill: 3 - Sickle Cell Panel - 235116 11+Oxyco+Alc+Crt-Bund  2. Chronic pain syndrome  - oxyCODONE -acetaminophen  (PERCOCET) 10-325 MG tablet; Take 1 tablet by mouth every 6 (six) hours as needed for pain.  Dispense: 60 tablet; Refill: 0 - naloxone  (NARCAN ) nasal spray 4 mg/0.1 mL; Use 1 nasal spray as a single dose in one nostril; may repeat with a new nasal spray every 2 to 3 minutes in alternating nostrils if needed for oopoidoverdose  Dispense: 1 each; Refill: 1

## 2023-06-30 ENCOUNTER — Other Ambulatory Visit: Payer: Self-pay | Admitting: Nurse Practitioner

## 2023-06-30 ENCOUNTER — Other Ambulatory Visit: Payer: Self-pay

## 2023-06-30 DIAGNOSIS — E876 Hypokalemia: Secondary | ICD-10-CM

## 2023-06-30 MED ORDER — POTASSIUM CHLORIDE CRYS ER 20 MEQ PO TBCR: 40.0000 meq | EXTENDED_RELEASE_TABLET | Freq: Once | ORAL | 0 refills | Status: DC

## 2023-07-01 LAB — CMP14+CBC/D/PLT+FER+RETIC+V...
ALT: 22 [IU]/L (ref 0–44)
AST: 42 [IU]/L — ABNORMAL HIGH (ref 0–40)
Albumin: 5 g/dL (ref 4.1–5.1)
Alkaline Phosphatase: 52 [IU]/L (ref 44–121)
BUN/Creatinine Ratio: 14 (ref 9–20)
BUN: 19 mg/dL (ref 6–20)
Basophils Absolute: 0.1 10*3/uL (ref 0.0–0.2)
Basos: 0 %
Bilirubin Total: 0.8 mg/dL (ref 0.0–1.2)
CO2: 19 mmol/L — ABNORMAL LOW (ref 20–29)
Calcium: 9.3 mg/dL (ref 8.7–10.2)
Chloride: 96 mmol/L (ref 96–106)
Creatinine, Ser: 1.4 mg/dL — ABNORMAL HIGH (ref 0.76–1.27)
EOS (ABSOLUTE): 0.1 10*3/uL (ref 0.0–0.4)
Eos: 0 %
Ferritin: 994 ng/mL — ABNORMAL HIGH (ref 30–400)
Globulin, Total: 3.2 g/dL (ref 1.5–4.5)
Glucose: 90 mg/dL (ref 70–99)
Hematocrit: 28.7 % — ABNORMAL LOW (ref 37.5–51.0)
Hemoglobin: 10.2 g/dL — ABNORMAL LOW (ref 13.0–17.7)
Immature Grans (Abs): 0 10*3/uL (ref 0.0–0.1)
Immature Granulocytes: 0 %
Lymphocytes Absolute: 5.6 10*3/uL — ABNORMAL HIGH (ref 0.7–3.1)
Lymphs: 49 %
MCH: 37.8 pg — ABNORMAL HIGH (ref 26.6–33.0)
MCHC: 35.5 g/dL (ref 31.5–35.7)
MCV: 106 fL — ABNORMAL HIGH (ref 79–97)
Monocytes Absolute: 0.7 10*3/uL (ref 0.1–0.9)
Monocytes: 6 %
NRBC: 8 % — ABNORMAL HIGH (ref 0–0)
Neutrophils Absolute: 5.3 10*3/uL (ref 1.4–7.0)
Neutrophils: 45 %
Platelets: 377 10*3/uL (ref 150–450)
Potassium: 3.4 mmol/L — ABNORMAL LOW (ref 3.5–5.2)
RBC: 2.7 x10E6/uL — CL (ref 4.14–5.80)
RDW: 16.5 % — ABNORMAL HIGH (ref 11.6–15.4)
Retic Ct Pct: 3.5 % — ABNORMAL HIGH (ref 0.6–2.6)
Sodium: 134 mmol/L (ref 134–144)
Total Protein: 8.2 g/dL (ref 6.0–8.5)
Vit D, 25-Hydroxy: 22.8 ng/mL — ABNORMAL LOW (ref 30.0–100.0)
WBC: 11.7 10*3/uL — ABNORMAL HIGH (ref 3.4–10.8)
eGFR: 66 mL/min/{1.73_m2} (ref >59)

## 2023-07-04 LAB — OXYCODONE/OXYMORPHONE, CONFIRM
OXYCODONE/OXYMORPH: POSITIVE — AB
OXYCODONE: 429 ng/mL
OXYCODONE: POSITIVE — AB
OXYMORPHONE: NEGATIVE

## 2023-07-04 LAB — DRUG SCREEN 764883 11+OXYCO+ALC+CRT-BUND
Amphetamines, Urine: NEGATIVE ng/mL
BENZODIAZ UR QL: NEGATIVE ng/mL
Barbiturate: NEGATIVE ng/mL
Cannabinoid Quant, Ur: NEGATIVE ng/mL
Cocaine (Metabolite): NEGATIVE ng/mL
Creatinine: 33.2 mg/dL (ref 20.0–300.0)
Ethanol: NEGATIVE %
Meperidine: NEGATIVE ng/mL
Methadone Screen, Urine: NEGATIVE ng/mL
OPIATE SCREEN URINE: NEGATIVE ng/mL
Phencyclidine: NEGATIVE ng/mL
Propoxyphene: NEGATIVE ng/mL
Tramadol: NEGATIVE ng/mL
pH, Urine: 5.1 (ref 4.5–8.9)

## 2023-07-06 ENCOUNTER — Other Ambulatory Visit: Payer: Self-pay

## 2023-07-06 DIAGNOSIS — E876 Hypokalemia: Secondary | ICD-10-CM

## 2023-07-07 LAB — BASIC METABOLIC PANEL
BUN/Creatinine Ratio: 10 (ref 9–20)
BUN: 7 mg/dL (ref 6–20)
CO2: 23 mmol/L (ref 20–29)
Calcium: 9.1 mg/dL (ref 8.7–10.2)
Chloride: 100 mmol/L (ref 96–106)
Creatinine, Ser: 0.68 mg/dL — ABNORMAL LOW (ref 0.76–1.27)
Glucose: 81 mg/dL (ref 70–99)
Potassium: 4.4 mmol/L (ref 3.5–5.2)
Sodium: 137 mmol/L (ref 134–144)
eGFR: 123 mL/min/{1.73_m2} (ref >59)

## 2023-07-14 ENCOUNTER — Other Ambulatory Visit: Payer: Self-pay

## 2023-07-14 ENCOUNTER — Other Ambulatory Visit: Payer: Self-pay | Admitting: Nurse Practitioner

## 2023-07-14 DIAGNOSIS — G894 Chronic pain syndrome: Secondary | ICD-10-CM

## 2023-07-14 DIAGNOSIS — D571 Sickle-cell disease without crisis: Secondary | ICD-10-CM

## 2023-07-14 MED ORDER — OXYCODONE-ACETAMINOPHEN 10-325 MG PO TABS: 1.0000 | ORAL_TABLET | Freq: Four times a day (QID) | ORAL | 0 refills | Status: DC | PRN

## 2023-07-14 NOTE — Telephone Encounter (Signed)
Please advise K[facility] 

## 2023-07-14 NOTE — Progress Notes (Signed)
Reviewed PDMP substance reporting system prior to prescribing opiate medications. No inconsistencies noted.    1. Chronic pain syndrome  - oxyCODONE-acetaminophen (PERCOCET) 10-325 MG tablet; Take 1 tablet by mouth every 6 (six) hours as needed for pain.  Dispense: 60 tablet; Refill: 0  2. Hb-SS disease without crisis (HCC)  - oxyCODONE-acetaminophen (PERCOCET) 10-325 MG tablet; Take 1 tablet by mouth every 6 (six) hours as needed for pain.  Dispense: 60 tablet; Refill: 0

## 2023-07-27 ENCOUNTER — Other Ambulatory Visit: Payer: Self-pay | Admitting: Nurse Practitioner

## 2023-07-27 DIAGNOSIS — G894 Chronic pain syndrome: Secondary | ICD-10-CM

## 2023-07-27 DIAGNOSIS — D571 Sickle-cell disease without crisis: Secondary | ICD-10-CM

## 2023-07-27 MED ORDER — OXYCODONE-ACETAMINOPHEN 10-325 MG PO TABS: 1.0000 | ORAL_TABLET | Freq: Four times a day (QID) | ORAL | 0 refills | Status: DC | PRN

## 2023-07-27 NOTE — Progress Notes (Unsigned)
 Reviewed PDMP substance reporting system prior to prescribing opiate medications. No inconsistencies noted.    1. Chronic pain syndrome  - oxyCODONE-acetaminophen (PERCOCET) 10-325 MG tablet; Take 1 tablet by mouth every 6 (six) hours as needed for pain.  Dispense: 60 tablet; Refill: 0  2. Hb-SS disease without crisis (HCC)  - oxyCODONE-acetaminophen (PERCOCET) 10-325 MG tablet; Take 1 tablet by mouth every 6 (six) hours as needed for pain.  Dispense: 60 tablet; Refill: 0

## 2023-08-11 ENCOUNTER — Other Ambulatory Visit: Payer: Self-pay | Admitting: Nurse Practitioner

## 2023-08-11 DIAGNOSIS — G894 Chronic pain syndrome: Secondary | ICD-10-CM

## 2023-08-11 DIAGNOSIS — D571 Sickle-cell disease without crisis: Secondary | ICD-10-CM

## 2023-08-11 MED ORDER — OXYCODONE-ACETAMINOPHEN 10-325 MG PO TABS
1.0000 | ORAL_TABLET | Freq: Four times a day (QID) | ORAL | 0 refills | Status: DC | PRN
Start: 2023-08-14 — End: 2023-08-25

## 2023-08-11 NOTE — Progress Notes (Signed)
 Reviewed PDMP substance reporting system prior to prescribing opiate medications. No inconsistencies noted.    1. Chronic pain syndrome  - oxyCODONE-acetaminophen (PERCOCET) 10-325 MG tablet; Take 1 tablet by mouth every 6 (six) hours as needed for pain.  Dispense: 60 tablet; Refill: 0  2. Hb-SS disease without crisis (HCC)  - oxyCODONE-acetaminophen (PERCOCET) 10-325 MG tablet; Take 1 tablet by mouth every 6 (six) hours as needed for pain.  Dispense: 60 tablet; Refill: 0

## 2023-08-25 ENCOUNTER — Other Ambulatory Visit: Payer: Self-pay | Admitting: Nurse Practitioner

## 2023-08-25 ENCOUNTER — Other Ambulatory Visit: Payer: Self-pay

## 2023-08-25 DIAGNOSIS — D571 Sickle-cell disease without crisis: Secondary | ICD-10-CM

## 2023-08-25 DIAGNOSIS — G894 Chronic pain syndrome: Secondary | ICD-10-CM

## 2023-08-25 MED ORDER — OXYCODONE-ACETAMINOPHEN 10-325 MG PO TABS: 1.0000 | ORAL_TABLET | Freq: Four times a day (QID) | ORAL | 0 refills | Status: DC | PRN

## 2023-08-25 NOTE — Telephone Encounter (Signed)
 Please advise [facility]

## 2023-08-25 NOTE — Progress Notes (Signed)
 Reviewed PDMP substance reporting system prior to prescribing opiate medications. No inconsistencies noted.    1. Chronic pain syndrome  - oxyCODONE-acetaminophen (PERCOCET) 10-325 MG tablet; Take 1 tablet by mouth every 6 (six) hours as needed for pain.  Dispense: 60 tablet; Refill: 0  2. Hb-SS disease without crisis (HCC)  - oxyCODONE-acetaminophen (PERCOCET) 10-325 MG tablet; Take 1 tablet by mouth every 6 (six) hours as needed for pain.  Dispense: 60 tablet; Refill: 0

## 2023-09-07 ENCOUNTER — Telehealth: Payer: Self-pay

## 2023-09-07 NOTE — Transitions of Care (Post Inpatient/ED Visit) (Signed)
   09/07/2023  Name: Alejandro Soto MRN: [id number] DOB: 1986/02/10  Today's TOC FU Call Status: Today's TOC FU Call Status:: Unsuccessful Call (1st Attempt) Unsuccessful Call (1st Attempt) Date: 09/07/23  Attempted to reach the patient regarding the most recent Inpatient/ED visit.  Follow Up Plan: Additional outreach attempts will be made to reach the patient to complete the Transitions of Care (Post Inpatient/ED visit) call.   Signature Renelda Loma RMA'

## 2023-09-07 NOTE — Transitions of Care (Post Inpatient/ED Visit) (Cosign Needed)
   09/07/2023  Name: Alejandro Soto MRN: [id number] DOB: Nov 14, 1985  Pt is a sickle cell pt and except form TOC calls. Was made an appointment due to the nature of crisis.   Renelda Loma RMA

## 2023-09-14 ENCOUNTER — Ambulatory Visit: Payer: Self-pay | Admitting: Nurse Practitioner

## 2023-09-14 ENCOUNTER — Encounter: Payer: Self-pay | Admitting: Nurse Practitioner

## 2023-09-14 VITALS — BP 111/73 | HR 66 | Temp 97.9°F | Wt 159.6 lb

## 2023-09-14 DIAGNOSIS — Z09 Encounter for follow-up examination after completed treatment for conditions other than malignant neoplasm: Secondary | ICD-10-CM | POA: Diagnosis not present

## 2023-09-14 DIAGNOSIS — G894 Chronic pain syndrome: Secondary | ICD-10-CM

## 2023-09-14 DIAGNOSIS — R4182 Altered mental status, unspecified: Secondary | ICD-10-CM | POA: Insufficient documentation

## 2023-09-14 DIAGNOSIS — D571 Sickle-cell disease without crisis: Secondary | ICD-10-CM

## 2023-09-14 MED ORDER — IBUPROFEN 800 MG PO TABS: 800.0000 mg | ORAL_TABLET | Freq: Three times a day (TID) | ORAL | 0 refills | Status: DC | PRN

## 2023-09-14 MED ORDER — OXYCODONE-ACETAMINOPHEN 10-325 MG PO TABS: 1.0000 | ORAL_TABLET | Freq: Four times a day (QID) | ORAL | 0 refills | Status: DC | PRN

## 2023-09-14 NOTE — Patient Instructions (Signed)
 1. Hb-SS disease without crisis (HCC) (Primary)  - 147829 11+Oxyco+Alc+Crt-Bund - Sickle Cell Panel - ibuprofen (ADVIL) 800 MG tablet; Take 1 tablet (800 mg total) by mouth every 8 (eight) hours as needed for mild pain (pain score 1-3).  Dispense: 90 tablet; Refill: 0 - oxyCODONE-acetaminophen (PERCOCET) 10-325 MG tablet; Take 1 tablet by mouth every 6 (six) hours as needed for pain.  Dispense: 60 tablet; Refill: 0  2. Chronic pain syndrome  - 562130 11+Oxyco+Alc+Crt-Bund - oxyCODONE-acetaminophen (PERCOCET) 10-325 MG tablet; Take 1 tablet by mouth every 6 (six) hours as needed for pain.  Dispense: 60 tablet; Refill: 0  3. Encounter for examination following treatment at hospital    It is important that you exercise regularly at least 30 minutes 5 times a week as tolerated  Think about what you will eat, plan ahead. Choose " clean, green, fresh or frozen" over canned, processed or packaged foods which are more sugary, salty and fatty. 70 to 75% of food eaten should be vegetables and fruit. Three meals at set times with snacks allowed between meals, but they must be fruit or vegetables. Aim to eat over a 12 hour period , example 7 am to 7 pm, and STOP after  your last meal of the day. Drink water,generally about 64 ounces per day, no other drink is as healthy. Fruit juice is best enjoyed in a healthy way, by EATING the fruit.  Thanks for choosing [facility] we consider it a privelige to serve you.

## 2023-09-14 NOTE — Assessment & Plan Note (Signed)
Hospital chart reviewed.

## 2023-09-14 NOTE — Assessment & Plan Note (Signed)
 Patient alert and oriented Encouraged to avoid taking any medication not prescribed by a medical provider Encouraged to reports excessive sedation while taking Percocet for chronic pain

## 2023-09-14 NOTE — Assessment & Plan Note (Signed)
-   [id number] 11+Oxyco+Alc+Crt-Bund - oxyCODONE-acetaminophen (PERCOCET) 10-325 MG tablet; Take 1 tablet by mouth every 6 (six) hours as needed for pain.  Dispense: 60 tablet; Refill: 0

## 2023-09-14 NOTE — Assessment & Plan Note (Signed)
 Sickle cell disease - Continue Hydrea 1500 mg daily. Folic acid 1 mg daily . Will check sickle cell panel for absolute neutrophil count and platelets. Will also check reticulocyte count.  We discussed the need for good hydration, monitoring of hydration status, avoidance of heat, cold, stress, and infection triggers. . The patient was reminded of the need to seek medical attention of any symptoms of bleeding, anemia, or infection.    Pulmonary evaluation - Patient denies severe recurrent wheezes, shortness of breath with exercise, or persistent cough. If these symptoms develop, pulmonary function tests with spirometry will be ordered, and if abnormal, plan on referral to Pulmonology for further evaluation.   Cardiac-patient denies chest pain  Eye - High risk of proliferative retinopathy. Up to date with Annual eye exam   Acute and chronic painful episodes -   Continue ibuprofen 800 mg every 8 hours as needed for mild pain Percocet 03/24/2024 1 tablet every 6 hours as needed for moderate pain We reviewed the terms of our pain agreement, including the  need to report excess sedation , encouraged to avoid taking any medication not prescribed by a medical provider According to the [place] Chronic Pain Initiative program, we have reviewed details related to analgesia, adverse effects, aberrant behaviors. Reviewed [place] Substance Reporting system prior to prescribing opiate medication, no inconsistencies noted.    - [id number] 11+Oxyco+Alc+Crt-Bund - Sickle Cell Panel - ibuprofen (ADVIL) 800 MG tablet; Take 1 tablet (800 mg total) by mouth every 8 (eight) hours as needed for mild pain (pain score 1-3).  Dispense: 90 tablet; Refill: 0 - oxyCODONE-acetaminophen (PERCOCET) 10-325 MG tablet; Take 1 tablet by mouth every 6 (six) hours as needed for pain.  Dispense: 60 tablet; Refill: 0

## 2023-09-14 NOTE — Progress Notes (Signed)
 Established Patient Office Visit  Subjective:  Patient ID: Alejandro Soto, male    DOB: [birth year]  Age: 38 y.o. MRN: [id number]  CC:  Chief Complaint  Patient presents with   Hospitalization Follow-up    HPI Alejandro Soto is a 38 y.o. male   has a past medical history of Insomnia (08/2019), Pneumonia, and Sickle cell anemia (HCC).    Patient presents for hospitalization follow-up.  Patient was at the emergency department at [facility] on 09/05/2023 after being found unresponsive.  Patient stated that he had taken a pill'' Klonopin'' from someone because he was in pain and did not have his pain medication.  [place] the day he was found unresponsive, does not remember being in the hospital.  The review of notes at the hospital showed that the patient left the hospital before treatment was completed.  Takes Percocet and ibuprofen for his chronic sickle cell pain. His sickle cell pain is currently rated 4/10, pain is mainly in his lower back states that he has been out of Percocet for some days now.  Up-to-date with yearly eye exam.  Patient currently denies fever, chills, chest pain, shortness of breath, abdominal pain, nausea, vomiting.  Patient alert and oriented x 4    Past Medical History:  Diagnosis Date   Insomnia 08/2019   Pneumonia    Sickle cell anemia (HCC)     Past Surgical History:  Procedure Laterality Date   NO PAST SURGERIES      Family History  Problem Relation Age of Onset   Cancer Maternal Grandmother        colon    Diabetes Father    Cancer Father 48       Prostate   Asthma Brother     Social History   Socioeconomic History   Marital status: Single    Spouse name: Not on file   Number of children: Not on file   Years of education: Not on file   Highest education level: Associate degree: occupational, Scientist, product/process development, or vocational program  Occupational History   Not on file  Tobacco Use   Smoking status:  Former    Current packs/day: 0.33    Average packs/day: 0.3 packs/day for 5.0 years (1.7 ttl pk-yrs)    Types: Cigarettes   Smokeless tobacco: Never  Vaping Use   Vaping status: Never Used  Substance and Sexual Activity   Alcohol use: No    Alcohol/week: 0.0 standard drinks of alcohol   Drug use: No   Sexual activity: Yes    Birth control/protection: Condom  Other Topics Concern   Not on file  Social History Narrative   Lives with his mother    Social Drivers of Health   Financial Resource Strain: Medium Risk (03/19/2023)   Overall Financial Resource Strain (CARDIA)    Difficulty of Paying Living Expenses: Somewhat hard  Food Insecurity: No Food Insecurity (03/19/2023)   Hunger Vital Sign    Worried About Running Out of Food in the Last Year: Never true    Ran Out of Food in the Last Year: Never true  Transportation Needs: No Transportation Needs (03/19/2023)   PRAPARE - Administrator, Civil Service (Medical): No    Lack of Transportation (Non-Medical): No  Recent Concern: Transportation Needs - Unmet Transportation Needs (12/22/2022)   PRAPARE - Administrator, Civil Service (Medical): Yes    Lack of Transportation (Non-Medical): No  Physical Activity: Sufficiently Active (03/19/2023)   Exercise Vital Sign    Days of Exercise per Week: 5 days    Minutes of Exercise per Session: 30 min  Stress: No Stress Concern Present (03/19/2023)   [employer] of Occupational Health - Occupational Stress Questionnaire    Feeling of Stress : Not at all  Social Connections: Moderately Integrated (03/19/2023)   Social Connection and Isolation Panel [NHANES]    Frequency of Communication with Friends and Family: More than three times a week    Frequency of Social Gatherings with Friends and Family: Once a week    Attends Religious Services: More than 4 times per year    Active Member of [employer] or Organizations: Yes    Attends Banker Meetings: More than  4 times per year    Marital Status: Never married  Intimate Partner Violence: Not At Risk (09/05/2023)   Received from [facility]   HITS    Over the last 12 months how often did your partner physically hurt you?: Never    Over the last 12 months how often did your partner insult you or talk down to you?: Never    Over the last 12 months how often did your partner threaten you with physical harm?: Never    Over the last 12 months how often did your partner scream or curse at you?: Never    Outpatient Medications Prior to Visit  Medication Sig Dispense Refill   folic acid (FOLVITE) 1 MG tablet Take 1 tablet (1 mg total) by mouth daily. 90 tablet 1   hydroxyurea (HYDREA) 500 MG capsule Take 3 capsules (1,500 mg total) by mouth daily. May take with food to minimize GI side effects. 270 capsule 3   naloxone (NARCAN) nasal spray 4 mg/0.1 mL Use 1 nasal spray as a single dose in one nostril; may repeat with a new nasal spray every 2 to 3 minutes in alternating nostrils if needed for oopoidoverdose 1 each 1   Nutritional Supplements (EAA SUPPLEMENT PO) Take by mouth.     oxyCODONE-acetaminophen (PERCOCET) 10-325 MG tablet Take 1 tablet by mouth every 6 (six) hours as needed for pain. 60 tablet 0   triamcinolone (KENALOG) 0.025 % ointment Apply 1 application topically 2 (two) times daily. 30 g 2   REXULTI 1 MG TABS  (Patient not taking: Reported on 03/03/2022)     ibuprofen (ADVIL) 800 MG tablet Take 1 tablet (800 mg total) by mouth every 8 (eight) hours as needed for mild pain. (Patient not taking: Reported on 09/14/2023) 90 tablet 0   potassium chloride SA (KLOR-CON M) 20 MEQ tablet Take 2 tablets (40 mEq total) by mouth once for 1 dose. (Patient not taking: Reported on 09/14/2023) 2 tablet 0   No facility-administered medications prior to visit.    Allergies  Allergen Reactions   Amoxicillin Diarrhea    Did it involve swelling of the face/tongue/throat, SOB, or low BP? No Did it involve sudden  or severe rash/hives, skin peeling, or any reaction on the inside of your mouth or nose? No Did you need to seek medical attention at a hospital or doctor's office? No When did it last happen?      childhood If all above answers are "NO", may proceed with cephalosporin use.     ROS Review of Systems  Constitutional:  Negative for appetite change, chills, fatigue and fever.  HENT:  Negative for congestion, postnasal drip, rhinorrhea and sneezing.   Respiratory:  Negative for cough, shortness of breath and wheezing.   Cardiovascular:  Negative for chest pain, palpitations and leg swelling.  Gastrointestinal:  Negative for abdominal pain, constipation, nausea and vomiting.  Genitourinary:  Negative for difficulty urinating, dysuria, flank pain and frequency.  Musculoskeletal:  Positive for arthralgias. Negative for back pain, joint swelling and myalgias.  Skin:  Negative for color change, pallor, rash and wound.  Neurological:  Negative for dizziness, facial asymmetry, weakness, numbness and headaches.  Psychiatric/Behavioral:  Negative for behavioral problems, confusion, self-injury and suicidal ideas.       Objective:    Physical Exam Vitals and nursing note reviewed.  Constitutional:      General: He is not in acute distress.    Appearance: Normal appearance. He is not ill-appearing, toxic-appearing or diaphoretic.  HENT:     Mouth/Throat:     Mouth: Mucous membranes are moist.     Pharynx: Oropharynx is clear. No oropharyngeal exudate or posterior oropharyngeal erythema.  Eyes:     General: Scleral icterus present.        Right eye: No discharge.        Left eye: No discharge.     Extraocular Movements: Extraocular movements intact.     Conjunctiva/sclera: Conjunctivae normal.  Cardiovascular:     Rate and Rhythm: Normal rate and regular rhythm.     Pulses: Normal pulses.     Heart sounds: Normal heart sounds. No murmur heard.    No friction rub. No gallop.  Pulmonary:      Effort: Pulmonary effort is normal. No respiratory distress.     Breath sounds: Normal breath sounds. No stridor. No wheezing, rhonchi or rales.  Chest:     Chest wall: No tenderness.  Abdominal:     General: There is no distension.     Palpations: Abdomen is soft.     Tenderness: There is no abdominal tenderness. There is no right CVA tenderness, left CVA tenderness or guarding.  Musculoskeletal:        General: No swelling, deformity or signs of injury.     Right lower leg: No edema.     Left lower leg: No edema.  Skin:    General: Skin is warm and dry.     Capillary Refill: Capillary refill takes less than 2 seconds.     Coloration: Skin is not jaundiced or pale.     Findings: No bruising, erythema or lesion.  Neurological:     Mental Status: He is alert and oriented to person, place, and time.     Motor: No weakness.     Coordination: Coordination normal.     Gait: Gait normal.  Psychiatric:        Mood and Affect: Mood normal.        Behavior: Behavior normal.        Thought Content: Thought content normal.        Judgment: Judgment normal.     BP 111/73   Pulse 66   Temp 97.9 F (36.6 C) (Oral)   Wt 159 lb 9.6 oz (72.4 kg)   SpO2 100%   BMI 24.27 kg/m  Wt Readings from Last 3 Encounters:  09/14/23 159 lb 9.6 oz (72.4 kg)  06/29/23 157 lb (71.2 kg)  03/23/23 169 lb (76.7 kg)    Lab Results  Component Value Date   TSH 1.070 07/11/2021   Lab Results  Component Value Date   WBC 11.7 (H) 06/29/2023   HGB 10.2 (L) 06/29/2023  HCT 28.7 (L) 06/29/2023   MCV 106 (H) 06/29/2023   PLT 377 06/29/2023   Lab Results  Component Value Date   NA 137 07/06/2023   K 4.4 07/06/2023   CO2 23 07/06/2023   GLUCOSE 81 07/06/2023   BUN 7 07/06/2023   CREATININE 0.68 (L) 07/06/2023   BILITOT 0.8 06/29/2023   ALKPHOS 52 06/29/2023   AST 42 (H) 06/29/2023   ALT 22 06/29/2023   PROT 8.2 06/29/2023   ALBUMIN 5.0 06/29/2023   CALCIUM 9.1 07/06/2023   ANIONGAP 10  07/31/2020   EGFR 123 07/06/2023   Lab Results  Component Value Date   CHOL 136 04/10/2021   Lab Results  Component Value Date   HDL 31 (L) 04/10/2021   Lab Results  Component Value Date   LDLCALC 84 04/10/2021   Lab Results  Component Value Date   TRIG 111 04/10/2021   Lab Results  Component Value Date   CHOLHDL 4.4 04/10/2021   Lab Results  Component Value Date   HGBA1C <4.0 09/07/2013      Assessment & Plan:   Problem List Items Addressed This Visit       Other   Hb-SS disease without crisis (HCC) - Primary   Sickle cell disease - Continue Hydrea 1500 mg daily. Folic acid 1 mg daily . Will check sickle cell panel for absolute neutrophil count and platelets. Will also check reticulocyte count.  We discussed the need for good hydration, monitoring of hydration status, avoidance of heat, cold, stress, and infection triggers. . The patient was reminded of the need to seek medical attention of any symptoms of bleeding, anemia, or infection.    Pulmonary evaluation - Patient denies severe recurrent wheezes, shortness of breath with exercise, or persistent cough. If these symptoms develop, pulmonary function tests with spirometry will be ordered, and if abnormal, plan on referral to Pulmonology for further evaluation.   Cardiac-patient denies chest pain  Eye - High risk of proliferative retinopathy. Up to date with Annual eye exam   Acute and chronic painful episodes -   Continue ibuprofen 800 mg every 8 hours as needed for mild pain Percocet 03/24/2024 1 tablet every 6 hours as needed for moderate pain We reviewed the terms of our pain agreement, including the  need to report excess sedation , encouraged to avoid taking any medication not prescribed by a medical provider According to the [place] [facility] program, we have reviewed details related to analgesia, adverse effects, aberrant behaviors. Reviewed [place] Substance Reporting system prior to prescribing opiate  medication, no inconsistencies noted.    - [id number] 11+Oxyco+Alc+Crt-Bund - Sickle Cell Panel - ibuprofen (ADVIL) 800 MG tablet; Take 1 tablet (800 mg total) by mouth every 8 (eight) hours as needed for mild pain (pain score 1-3).  Dispense: 90 tablet; Refill: 0 - oxyCODONE-acetaminophen (PERCOCET) 10-325 MG tablet; Take 1 tablet by mouth every 6 (six) hours as needed for pain.  Dispense: 60 tablet; Refill: 0           Relevant Medications   ibuprofen (ADVIL) 800 MG tablet   oxyCODONE-acetaminophen (PERCOCET) 10-325 MG tablet   Other Relevant Orders   [id number] 11+Oxyco+Alc+Crt-Bund   Sickle Cell Panel   Chronic pain syndrome    - [id number] 11+Oxyco+Alc+Crt-Bund - oxyCODONE-acetaminophen (PERCOCET) 10-325 MG tablet; Take 1 tablet by mouth every 6 (six) hours as needed for pain.  Dispense: 60 tablet; Refill: 0       Relevant Medications   ibuprofen (ADVIL) 800  MG tablet   oxyCODONE-acetaminophen (PERCOCET) 10-325 MG tablet   Other Relevant Orders   [id number] 11+Oxyco+Alc+Crt-Bund   Encounter for examination following treatment at hospital    Hospital chart reviewed      Altered mental status   Patient alert and oriented Encouraged to avoid taking any medication not prescribed by a medical provider Encouraged to reports excessive sedation while taking Percocet for chronic pain       Meds ordered this encounter  Medications   ibuprofen (ADVIL) 800 MG tablet    Sig: Take 1 tablet (800 mg total) by mouth every 8 (eight) hours as needed for mild pain (pain score 1-3).    Dispense:  90 tablet    Refill:  0   oxyCODONE-acetaminophen (PERCOCET) 10-325 MG tablet    Sig: Take 1 tablet by mouth every 6 (six) hours as needed for pain.    Dispense:  60 tablet    Refill:  0    Follow-up: Return in about 3 months (around 12/15/2023), or sickle cell diasese.    Donell Beers, FNP

## 2023-09-15 LAB — CMP14+CBC/D/PLT+FER+RETIC+V...
ALT: 10 IU/L (ref 0–44)
AST: 17 IU/L (ref 0–40)
Albumin: 4.2 g/dL (ref 4.1–5.1)
Alkaline Phosphatase: 52 IU/L (ref 44–121)
BUN/Creatinine Ratio: 11 (ref 9–20)
BUN: 8 mg/dL (ref 6–20)
Basophils Absolute: 0 10*3/uL (ref 0.0–0.2)
Basos: 0 %
Bilirubin Total: 0.6 mg/dL (ref 0.0–1.2)
CO2: 23 mmol/L (ref 20–29)
Calcium: 8.7 mg/dL (ref 8.7–10.2)
Chloride: 98 mmol/L (ref 96–106)
Creatinine, Ser: 0.72 mg/dL — ABNORMAL LOW (ref 0.76–1.27)
EOS (ABSOLUTE): 0 10*3/uL (ref 0.0–0.4)
Eos: 0 %
Ferritin: 337 ng/mL (ref 30–400)
Globulin, Total: 2.9 g/dL (ref 1.5–4.5)
Glucose: 88 mg/dL (ref 70–99)
Hematocrit: 15.1 % — CL (ref 37.5–51.0)
Hemoglobin: 5.4 g/dL — CL (ref 13.0–17.7)
Immature Grans (Abs): 0 10*3/uL (ref 0.0–0.1)
Immature Granulocytes: 0 %
Lymphocytes Absolute: 4.9 10*3/uL — ABNORMAL HIGH (ref 0.7–3.1)
Lymphs: 63 %
MCH: 39.1 pg — ABNORMAL HIGH (ref 26.6–33.0)
MCHC: 35.8 g/dL — ABNORMAL HIGH (ref 31.5–35.7)
MCV: 109 fL — ABNORMAL HIGH (ref 79–97)
Monocytes Absolute: 0.3 10*3/uL (ref 0.1–0.9)
Monocytes: 3 %
NRBC: 26 % — ABNORMAL HIGH (ref 0–0)
Neutrophils Absolute: 2.7 10*3/uL (ref 1.4–7.0)
Neutrophils: 34 %
Platelets: 544 10*3/uL — ABNORMAL HIGH (ref 150–450)
Potassium: 3.9 mmol/L (ref 3.5–5.2)
RBC: 1.38 x10E6/uL — CL (ref 4.14–5.80)
RDW: 18.8 % — ABNORMAL HIGH (ref 11.6–15.4)
Retic Ct Pct: 2.9 % — ABNORMAL HIGH (ref 0.6–2.6)
Sodium: 135 mmol/L (ref 134–144)
Total Protein: 7.1 g/dL (ref 6.0–8.5)
Vit D, 25-Hydroxy: 49.7 ng/mL (ref 30.0–100.0)
WBC: 8 10*3/uL (ref 3.4–10.8)
eGFR: 121 mL/min/{1.73_m2} (ref >59)

## 2023-09-15 NOTE — Telephone Encounter (Unsigned)
 Copied from CRM [id number]. Topic: Clinical - Lab/Test Results >> Sep 15, 2023  8:56 AM Gildardo Pounds wrote: Reason for CRM: Jerrod from [employer] with critical lab results. Callback number [phone] ext [phone]

## 2023-09-16 ENCOUNTER — Other Ambulatory Visit: Payer: Self-pay | Admitting: Nurse Practitioner

## 2023-09-16 ENCOUNTER — Non-Acute Institutional Stay (HOSPITAL_COMMUNITY)
Admission: RE | Admit: 2023-09-16 | Discharge: 2023-09-16 | Disposition: A | Discharge status: Home / Self Care | Payer: Medicare (Managed Care) | Source: Ambulatory Visit | Attending: Internal Medicine | Admitting: Internal Medicine

## 2023-09-16 ENCOUNTER — Telehealth: Payer: Self-pay

## 2023-09-16 VITALS — BP 110/69 | HR 79 | Temp 97.7°F | Resp 16

## 2023-09-16 DIAGNOSIS — D571 Sickle-cell disease without crisis: Secondary | ICD-10-CM | POA: Diagnosis present

## 2023-09-16 LAB — PREPARE RBC (CROSSMATCH)

## 2023-09-16 LAB — ABO/RH: ABO/RH(D): B POS

## 2023-09-16 MED ORDER — ACETAMINOPHEN 325 MG PO TABS
650.0000 mg | ORAL_TABLET | Freq: Once | ORAL | Status: AC
  Administered 2023-09-16: 650 mg via ORAL
  Filled 2023-09-16: qty 2

## 2023-09-16 MED ORDER — DIPHENHYDRAMINE HCL 25 MG PO CAPS
25.0000 mg | ORAL_CAPSULE | Freq: Once | ORAL | Status: AC
  Administered 2023-09-16: 25 mg via ORAL
  Filled 2023-09-16: qty 1

## 2023-09-16 MED ORDER — SODIUM CHLORIDE 0.9% IV SOLUTION: Freq: Once | INTRAVENOUS | Status: AC

## 2023-09-16 NOTE — Telephone Encounter (Signed)
 Copied from CRM [id number]. Topic: Clinical - Lab/Test Results >> Sep 15, 2023  2:42 PM Gery Pray wrote: Reason for CRM: Summer From [facility] called to provide Critical labs. Hemoglobin 5.4 Hematocrit 15.1 RBC 1.38

## 2023-09-16 NOTE — Progress Notes (Signed)
 PATIENT CARE CENTER NOTE   Diagnosis: Sickle Cell Anemia without crisis    Provider: Lynnell Chad, NP    Procedure: Blood transfusion    Note: Patient received 1 unit of PRBC's via PIV. Patient pre-medicated with PO Tylenol and PO Benadryl per order. Patient tolerated transfusion well with no adverse reaction. Vital signs stable. Discharge instructions given. Patient will come back on Monday to PCP office for post transfusion labs. Patient alert, oriented and ambulatory at discharge.

## 2023-09-17 LAB — TYPE AND SCREEN
ABO/RH(D): B POS
Antibody Screen: NEGATIVE
Unit division: 0

## 2023-09-17 LAB — BPAM RBC
Blood Product Expiration Date: 202504112359
ISSUE DATE / TIME: 202503271150
Unit Type and Rh: 1700

## 2023-09-19 LAB — DRUG SCREEN 764883 11+OXYCO+ALC+CRT-BUND
Amphetamines, Urine: NEGATIVE ng/mL
BENZODIAZ UR QL: NEGATIVE ng/mL
Barbiturate: NEGATIVE ng/mL
Cannabinoid Quant, Ur: NEGATIVE ng/mL
Cocaine (Metabolite): NEGATIVE ng/mL
Creatinine: 54.5 mg/dL (ref 20.0–300.0)
Ethanol: NEGATIVE %
Meperidine: NEGATIVE ng/mL
Methadone Screen, Urine: NEGATIVE ng/mL
OPIATE SCREEN URINE: NEGATIVE ng/mL
Phencyclidine: NEGATIVE ng/mL
Propoxyphene: NEGATIVE ng/mL
Tramadol: NEGATIVE ng/mL
pH, Urine: 5.4 (ref 4.5–8.9)

## 2023-09-19 LAB — OXYCODONE/OXYMORPHONE, CONFIRM
OXYCODONE/OXYMORPH: POSITIVE — AB
OXYCODONE: 1504 ng/mL
OXYCODONE: POSITIVE — AB
OXYMORPHONE (GC/MS): 572 ng/mL
OXYMORPHONE: POSITIVE — AB

## 2023-09-20 ENCOUNTER — Other Ambulatory Visit: Payer: Self-pay | Admitting: Nurse Practitioner

## 2023-09-20 ENCOUNTER — Other Ambulatory Visit: Payer: Self-pay

## 2023-09-20 DIAGNOSIS — D571 Sickle-cell disease without crisis: Secondary | ICD-10-CM

## 2023-09-21 ENCOUNTER — Ambulatory Visit: Payer: Self-pay | Admitting: Nurse Practitioner

## 2023-09-21 ENCOUNTER — Other Ambulatory Visit: Payer: Self-pay | Admitting: Nurse Practitioner

## 2023-09-21 DIAGNOSIS — D571 Sickle-cell disease without crisis: Secondary | ICD-10-CM

## 2023-09-21 LAB — CBC WITH DIFFERENTIAL/PLATELET
Basophils Absolute: 0 10*3/uL (ref 0.0–0.2)
Basos: 0 %
EOS (ABSOLUTE): 0.1 10*3/uL (ref 0.0–0.4)
Eos: 1 %
Hematocrit: 17.4 % — CL (ref 37.5–51.0)
Hemoglobin: 6.1 g/dL — CL (ref 13.0–17.7)
Immature Grans (Abs): 0 10*3/uL (ref 0.0–0.1)
Immature Granulocytes: 0 %
Lymphocytes Absolute: 4.1 10*3/uL — ABNORMAL HIGH (ref 0.7–3.1)
Lymphs: 59 %
MCH: 37 pg — ABNORMAL HIGH (ref 26.6–33.0)
MCHC: 35.1 g/dL (ref 31.5–35.7)
MCV: 106 fL — ABNORMAL HIGH (ref 79–97)
Monocytes Absolute: 0.2 10*3/uL (ref 0.1–0.9)
Monocytes: 3 %
NRBC: 2 % — ABNORMAL HIGH (ref 0–0)
Neutrophils Absolute: 2.6 10*3/uL (ref 1.4–7.0)
Neutrophils: 37 %
Platelets: 331 10*3/uL (ref 150–450)
RBC: 1.65 x10E6/uL — CL (ref 4.14–5.80)
RDW: 18.3 % — ABNORMAL HIGH (ref 11.6–15.4)
WBC: 7 10*3/uL (ref 3.4–10.8)

## 2023-09-21 NOTE — Telephone Encounter (Signed)
 Copied from CRM [id number]. Topic: Clinical - Lab/Test Results >> Sep 21, 2023 10:53 AM Turkey B wrote: Reason for CRM: karen from [facility] for critical labs    Chief Complaint: Lab Results  Additional Notes: call from [facility] to report critical HCT 17.4  Reason for Disposition  [1] Follow-up call to recent contact AND [2] information only call, no triage required  Answer Assessment - Initial Assessment Questions 1. REASON FOR CALL or QUESTION: "What is your reason for calling today?" or "How can I best help you?" or "What question do you have that I can help answer?"     Lab calling with critical results  Protocols used: Information Only Call - No Triage-A-AH

## 2023-09-22 ENCOUNTER — Non-Acute Institutional Stay (HOSPITAL_COMMUNITY)
Admission: AD | Admit: 2023-09-22 | Discharge: 2023-09-22 | Disposition: A | Discharge status: Home / Self Care | Payer: Medicare (Managed Care) | Source: Ambulatory Visit | Attending: Internal Medicine | Admitting: Internal Medicine

## 2023-09-22 ENCOUNTER — Other Ambulatory Visit: Payer: Self-pay | Admitting: Nurse Practitioner

## 2023-09-22 ENCOUNTER — Ambulatory Visit: Payer: Self-pay

## 2023-09-22 ENCOUNTER — Telehealth (HOSPITAL_COMMUNITY): Payer: Self-pay

## 2023-09-22 DIAGNOSIS — D638 Anemia in other chronic diseases classified elsewhere: Secondary | ICD-10-CM | POA: Diagnosis not present

## 2023-09-22 DIAGNOSIS — D5700 Hb-SS disease with crisis, unspecified: Secondary | ICD-10-CM | POA: Diagnosis present

## 2023-09-22 DIAGNOSIS — F1120 Opioid dependence, uncomplicated: Secondary | ICD-10-CM | POA: Insufficient documentation

## 2023-09-22 DIAGNOSIS — D571 Sickle-cell disease without crisis: Secondary | ICD-10-CM

## 2023-09-22 DIAGNOSIS — G894 Chronic pain syndrome: Secondary | ICD-10-CM | POA: Insufficient documentation

## 2023-09-22 LAB — IRON AND TIBC
Iron: 198 ug/dL — ABNORMAL HIGH (ref 45–182)
Saturation Ratios: 85 % — ABNORMAL HIGH (ref 17.9–39.5)
TIBC: 232 ug/dL — ABNORMAL LOW (ref 250–450)
UIBC: 34 ug/dL

## 2023-09-22 LAB — PREPARE RBC (CROSSMATCH)

## 2023-09-22 LAB — VITAMIN B12: Vitamin B-12: 237 pg/mL (ref 180–914)

## 2023-09-22 LAB — FERRITIN: Ferritin: 229 ng/mL (ref 24–336)

## 2023-09-22 MED ORDER — SENNOSIDES-DOCUSATE SODIUM 8.6-50 MG PO TABS
1.0000 | ORAL_TABLET | Freq: Two times a day (BID) | ORAL | Status: DC
  Filled 2023-09-22: qty 1

## 2023-09-22 MED ORDER — SODIUM CHLORIDE 0.9% FLUSH: 9.0000 mL | INTRAVENOUS | Status: DC | PRN

## 2023-09-22 MED ORDER — ONDANSETRON HCL 4 MG/2ML IJ SOLN
4.0000 mg | Freq: Four times a day (QID) | INTRAMUSCULAR | Status: DC | PRN
  Filled 2023-09-22: qty 2

## 2023-09-22 MED ORDER — POLYETHYLENE GLYCOL 3350 17 G PO PACK: 17.0000 g | PACK | Freq: Every day | ORAL | Status: DC | PRN

## 2023-09-22 MED ORDER — DIPHENHYDRAMINE HCL 25 MG PO CAPS
25.0000 mg | ORAL_CAPSULE | ORAL | Status: DC | PRN
  Administered 2023-09-22: 25 mg via ORAL
  Filled 2023-09-22: qty 1

## 2023-09-22 MED ORDER — SODIUM CHLORIDE 0.45 % IV SOLN: INTRAVENOUS | Status: DC

## 2023-09-22 MED ORDER — NALOXONE HCL 0.4 MG/ML IJ SOLN: 0.4000 mg | INTRAMUSCULAR | Status: DC | PRN

## 2023-09-22 MED ORDER — HYDROMORPHONE 1 MG/ML IV SOLN
INTRAVENOUS | Status: DC
  Administered 2023-09-22: 13 mg via INTRAVENOUS
  Administered 2023-09-22: 30 mg via INTRAVENOUS
  Filled 2023-09-22: qty 30

## 2023-09-22 MED ORDER — SODIUM CHLORIDE 0.9% IV SOLUTION: Freq: Once | INTRAVENOUS | Status: AC

## 2023-09-22 MED ORDER — ACETAMINOPHEN 500 MG PO TABS
1000.0000 mg | ORAL_TABLET | Freq: Once | ORAL | Status: AC
  Administered 2023-09-22: 1000 mg via ORAL
  Filled 2023-09-22: qty 2

## 2023-09-22 NOTE — Telephone Encounter (Signed)
 Pt called by staff after receiving E2C2 message. Complains of pain in lower back rates 8/10. Denied chest pain, abd pain, fever, N/V/D, or priapism. Wants to come in for treatment. Last took Percocet 10mg   at 8pm. Pt states he understands that he also needs to be given a blood transfusion today as discussed with his provider Mitzi Davenport FNP. Pt denies recent visits to the ED. Pt denies covid exposure or flu like symptoms. Pt states his mother is his transportation to and from center today. Onyeje FNP made aware, approved pt to be seen in the day hospital today. Pt notified, verbalized understanding.

## 2023-09-22 NOTE — Progress Notes (Addendum)
 Pt admitted to the day hospital by Tawanna Sat FNP for treatment of sickle cell pain crisis. Pt reported pain to lower back rated a 8/10. Pt given PO Benadryl and Tylenol,placed on Dilaudid PCA 0.5/10/3, and hydrated with 1/2 NS IV fluids.  Patient received via different PIV, 2 units of packed red blood cells. Consent for blood products obtained previously.Type and screen was done before transfusion. No additional premeds required other than tylenol and benadryl given  prior to administering PCA. Iron/ TIBC, ferritin and Vitamin B12 collected today per PCP Fola Paseda.Pt to RTC on Monday 09/27/23 for repeat CBC, pt verbalized understanding.  At discharge patient reported pain a 5/10. Pt declined AVS.  Pt alert , oriented and ambulatory at discharge.

## 2023-09-22 NOTE — Telephone Encounter (Signed)
  Chief Complaint: fatigue Symptoms: generalized moderate fatigue, severe lower back pain Frequency: x couple of weeks Pertinent Negatives: Patient denies chest pain, SOB, nausea, vomiting. Disposition: [] ED /[] Urgent Care (no appt availability in office) / [x] Appointment(In office/virtual)/ []  [facility]/ [] Home Care/ [] Refused Recommended Disposition /[] [facility]/ []  Follow-up with PCP Additional Notes: Patient calling in for triage regarding a mychart message from his provider. Patient states he is feeling fatigued and c/o lower back pain. Called [facility] and spoke with Alejandro Soto, she confirmed they received orders from provider and have availability today for patient. Patient set up with [facility] for appointment today.  Copied from CRM [id number]. Topic: Clinical - Red Word Triage >> Sep 22, 2023  8:02 AM Alejandro Soto wrote: Red Word that prompted transfer to Nurse Triage: patient returning call about labs, 8 on pain scale, patient stated blood count is low. Reason for Disposition  [1] MODERATE weakness (i.e., interferes with work, school, normal activities) AND [2] persists > 3 days  Answer Assessment - Initial Assessment Questions 1. DESCRIPTION: "Describe how you are feeling."     Patient states he feels a little tired, pain in lower back.  2. SEVERITY: "How bad is it?"  "Can you stand and walk?"   - MILD (0-3): Feels weak or tired, but does not interfere with work, school or normal activities.   - MODERATE (4-7): Able to stand and walk; weakness interferes with work, school, or normal activities.   - SEVERE (8-10): Unable to stand or walk; unable to do usual activities.     Moderate.  3. ONSET: "When did these symptoms begin?" (e.g., hours, days, weeks, months)     X couple weeks.  4. CAUSE: "What do you think is causing the weakness or fatigue?" (e.g., not drinking enough fluids, medical problem, trouble sleeping)     Patient states  this is how he feels with his sickle cell anemia/crisis.  5. NEW MEDICINES:  "Have you started on any new medicines recently?" (e.g., opioid pain medicines, benzodiazepines, muscle relaxants, antidepressants, antihistamines, neuroleptics, beta blockers)     Denies.  6. OTHER SYMPTOMS: "Do you have any other symptoms?" (e.g., chest pain, fever, cough, SOB, vomiting, diarrhea, bleeding, other areas of pain)     8/10 lower back pain, patient states he has taken his 10mg  percocet.  7. PREGNANCY: "Is there any chance you are pregnant?" "When was your last menstrual period?"     N/A.  Protocols used: Weakness (Generalized) and Fatigue-A-AH

## 2023-09-22 NOTE — Discharge Summary (Signed)
 Physician Discharge Summary  [name] JXB:[id number] DOB: [birth year] DOA: 09/22/2023  PCP: Donell Beers, FNP  Admit date: 09/22/2023  Discharge date: 09/22/2023  Time spent: 30 minutes  Discharge Diagnoses:  Active Problems:   Sickle cell anemia with pain (HCC)   Discharge Condition: Stable  Diet recommendation: Regular  History of present illness:   Alejandro Soto is a 38 y.o. male with a medical history significant for sickle cell disease, chronic pain syndrome, opiate dependence and tolerance, and history of anemia of chronic disease presents with low HGB and allover body pain that is consistent with his typical pain crisis. Patient has fairly well-controlled sickle cell disease with infrequent pain crises. Patient says the pain intensity has been increasing over the past week. He has been attempting to manage pain with prescribed home medications without any relief. Patient says that pain is mostly to low back and lower extremities and he rates pain at 9/10. He characterizes pain as constant and aching. He denies any fever, chills, chest pain, urinary symptoms, nausea, vomiting, or diarrhea. No sick contacts, recent travel,   Day Hospital Course:  [name] was admitted to the day hospital with sickle cell painful crisis. Patient was treated with IV fluid, weight based IV Dilaudid PCA,  clinician assisted doses as deemed appropriate, and other adjunct therapies per sickle cell pain management protocol. Patient received 2 unit of PRBC and tolerated infusion without complications. Patient will return tomorrow for repeat H&H. [name] showed significant improvement symptomatically, pain improved from 9/10 to 5/10 at the time of discharge. Patient was discharged home in a hemodynamically stable condition. [name] will follow-up at the clinic as previously scheduled, continue with home medications as per prior to admission.  Discharge Instructions We discussed the need for good hydration,  monitoring of hydration status, avoidance of heat, cold, stress, and infection triggers. We discussed the need to be compliant with  other home medications. [name] was reminded of the need to seek medical attention immediately if any symptom of bleeding, anemia, or infection occurs.  Discharge Exam: Vitals:   09/22/23 1418 09/22/23 1436  BP: (!) 110/58 106/67  Pulse: 64 61  Resp: 12 14  Temp: 98.5 F (36.9 C) 98 F (36.7 C)  SpO2: 100% 100%    General appearance: alert, cooperative and no distress Eyes: conjunctivae/corneas clear. PERRL, EOM's intact. Fundi benign. Neck: no adenopathy, no carotid bruit, no JVD, supple, symmetrical, trachea midline and thyroid not enlarged, symmetric, no tenderness/mass/nodules Back: symmetric, no curvature. ROM normal. No CVA tenderness. Resp: clear to auscultation bilaterally Chest wall: no tenderness Cardio: regular rate and rhythm, S1, S2 normal, no murmur, click, rub or gallop GI: soft, non-tender; bowel sounds normal; no masses,  no organomegaly Extremities: extremities normal, atraumatic, no cyanosis or edema Pulses: 2+ and symmetric Skin: Skin color, texture, turgor normal. No rashes or lesions Neurologic: Grossly normal  Discharge Instructions     Diet - low sodium heart healthy   Complete by: As directed    Increase activity slowly   Complete by: As directed       Allergies as of 09/22/2023       Reactions   Amoxicillin Diarrhea   Did it involve swelling of the face/tongue/throat, SOB, or low BP? No Did it involve sudden or severe rash/hives, skin peeling, or any reaction on the inside of your mouth or nose? No Did you need to seek medical attention at a hospital or doctor's office? No When did it last happen?  childhood If all above answers are "NO", may proceed with cephalosporin use.        Medication List     TAKE these medications    EAA SUPPLEMENT PO Take by mouth.   folic acid 1 MG tablet Commonly known as:  FOLVITE Take 1 tablet (1 mg total) by mouth daily.   hydroxyurea 500 MG capsule Commonly known as: HYDREA Take 3 capsules (1,500 mg total) by mouth daily. May take with food to minimize GI side effects.   ibuprofen 800 MG tablet Commonly known as: ADVIL Take 1 tablet (800 mg total) by mouth every 8 (eight) hours as needed for mild pain (pain score 1-3).   naloxone 4 MG/0.1ML Liqd nasal spray kit Commonly known as: NARCAN Use 1 nasal spray as a single dose in one nostril; may repeat with a new nasal spray every 2 to 3 minutes in alternating nostrils if needed for oopoidoverdose   oxyCODONE-acetaminophen 10-325 MG tablet Commonly known as: Percocet Take 1 tablet by mouth every 6 (six) hours as needed for pain.   Rexulti 1 MG Tabs tablet Generic drug: brexpiprazole       Allergies  Allergen Reactions   Amoxicillin Diarrhea    Did it involve swelling of the face/tongue/throat, SOB, or low BP? No Did it involve sudden or severe rash/hives, skin peeling, or any reaction on the inside of your mouth or nose? No Did you need to seek medical attention at a hospital or doctor's office? No When did it last happen?      childhood If all above answers are "NO", may proceed with cephalosporin use.      Significant Diagnostic Studies: No results found.  Signed:  Daryll Drown NP  09/22/2023, 4:03 PM

## 2023-09-22 NOTE — H&P (Signed)
 [facility] History and Physical  Tomasz Steeves [id number]:[id number] DOB: Nov 09, 1985 DOA: 09/22/2023  PCP: Donell Beers, FNP   Chief Complaint:  Chief Complaint  Patient presents with   Sickle Cell Pain Crisis    HPI: Alejandro Soto is a 38 y.o. male with a medical history significant for sickle cell disease, chronic pain syndrome, opiate dependence and tolerance, and history of anemia of chronic disease presents with low HGB and allover body pain that is consistent with his typical pain crisis. Patient has fairly well-controlled sickle cell disease with infrequent pain crises. Patient says the pain intensity has been increasing over the past week. He has been attempting to manage pain with prescribed home medications without any relief. Patient says that pain is mostly to low back and lower extremities and he rates pain at 9/10. He characterizes pain as constant and aching. He denies any fever, chills, chest pain, urinary symptoms, nausea, vomiting, or diarrhea. No sick contacts, recent travel,   Systemic Review: General: The patient denies anorexia, fever, weight loss Cardiac: Denies chest pain, syncope, palpitations, pedal edema  Respiratory: Denies cough, shortness of breath, wheezing GI: Denies severe indigestion/heartburn, abdominal pain, nausea, vomiting, diarrhea and constipation GU: Denies hematuria, incontinence, dysuria  Musculoskeletal: Denies arthritis  Skin: Denies suspicious skin lesions Neurologic: Denies focal weakness or numbness, change in vision  Past Medical History:  Diagnosis Date   Insomnia 08/2019   Pneumonia    Sickle cell anemia (HCC)     Past Surgical History:  Procedure Laterality Date   NO PAST SURGERIES      Allergies  Allergen Reactions   Amoxicillin Diarrhea    Did it involve swelling of the face/tongue/throat, SOB, or low BP? No Did it involve sudden or severe rash/hives, skin peeling, or any reaction on the inside of your mouth or  nose? No Did you need to seek medical attention at a hospital or doctor's office? No When did it last happen?      childhood If all above answers are "NO", may proceed with cephalosporin use.     Family History  Problem Relation Age of Onset   Cancer Maternal Grandmother        colon    Diabetes Father    Cancer Father 78       Prostate   Asthma Brother       Prior to Admission medications   Medication Sig Start Date End Date Taking? Authorizing Provider  folic acid (FOLVITE) 1 MG tablet Take 1 tablet (1 mg total) by mouth daily. 06/29/23  Yes Paseda, Baird Kay, FNP  hydroxyurea (HYDREA) 500 MG capsule Take 3 capsules (1,500 mg total) by mouth daily. May take with food to minimize GI side effects. 06/29/23 06/28/24 Yes Paseda, Baird Kay, FNP  ibuprofen (ADVIL) 800 MG tablet Take 1 tablet (800 mg total) by mouth every 8 (eight) hours as needed for mild pain (pain score 1-3). 09/14/23  Yes Paseda, Baird Kay, FNP  Nutritional Supplements (EAA SUPPLEMENT PO) Take by mouth.   Yes [provider]  oxyCODONE-acetaminophen (PERCOCET) 10-325 MG tablet Take 1 tablet by mouth every 6 (six) hours as needed for pain. 09/14/23  Yes Paseda, Baird Kay, FNP  naloxone [facility]) nasal spray 4 mg/0.1 mL Use 1 nasal spray as a single dose in one nostril; may repeat with a new nasal spray every 2 to 3 minutes in alternating nostrils if needed for oopoidoverdose 06/29/23   Paseda, Baird Kay, FNP  REXULTI 1 MG TABS  09/01/18   [provider]     Physical Exam: Vitals:   09/22/23 1219 09/22/23 1241 09/22/23 1418 09/22/23 1436  BP: 113/65 105/61 (!) 110/58 106/67  Pulse: 67 66 64 61  Resp: 13 16 12 14   Temp: (!) 97.4 F (36.3 C) 97.8 F (36.6 C) 98.5 F (36.9 C) 98 F (36.7 C)  TempSrc: Temporal Temporal Temporal Temporal  SpO2: 99% 100% 100% 100%    General: Alert, awake, afebrile, anicteric, not in obvious distress HEENT: Normocephalic and Atraumatic, Mucous membranes pink                 PERRLA; EOM intact; No scleral icterus,                 Nares: Patent, Oropharynx: Clear, Fair Dentition                 Neck: FROM, no cervical lymphadenopathy, thyromegaly, carotid bruit or JVD;  CHEST WALL: No tenderness  CHEST: Normal respiration, clear to auscultation bilaterally  HEART: Regular rate and rhythm; no murmurs rubs or gallops  BACK: No kyphosis or scoliosis; no CVA tenderness  ABDOMEN: Positive Bowel Sounds, soft, non-tender; no masses, no organomegaly EXTREMITIES: No cyanosis, clubbing, or edema Musculoskeletal: Lower back /lower extremity pain  SKIN:  no rash or ulceration  CNS: Alert and Oriented x 4, Nonfocal exam, CN 2-12 intact  Labs on Admission:  Basic Metabolic Panel: No results for input(s): "NA", "K", "CL", "CO2", "GLUCOSE", "BUN", "CREATININE", "CALCIUM", "MG", "PHOS" in the last 168 hours. Liver Function Tests: No results for input(s): "AST", "ALT", "ALKPHOS", "BILITOT", "PROT", "ALBUMIN" in the last 168 hours. No results for input(s): "LIPASE", "AMYLASE" in the last 168 hours. No results for input(s): "AMMONIA" in the last 168 hours. CBC: Recent Labs  Lab 09/20/23 1004  WBC 7.0  NEUTROABS 2.6  HGB 6.1*  HCT 17.4*  MCV 106*  PLT 331   Cardiac Enzymes: No results for input(s): "CKTOTAL", "CKMB", "CKMBINDEX", "TROPONINI" in the last 168 hours.  BNP (last 3 results) No results for input(s): "BNP" in the last 8760 hours.  ProBNP (last 3 results) No results for input(s): "PROBNP" in the last 8760 hours.  CBG: No results for input(s): "GLUCAP" in the last 168 hours.   Assessment/Plan Active Problems:   Sickle cell anemia with pain (HCC)  Admits to the Day Hospital for extended observation IVF 0..45% Saline @20  mls/hour 2 unit PRBC Weight based Dilaudid PCA started within 30 minutes of admission Acetaminophen 1000 mg x 1 dose Monitor vitals very closely, Re-evaluate pain scale every hour 2 L of Oxygen by [place] Patient will be  re-evaluated for pain in the context of function and relationship to baseline as care progresses. If no significant relieve from pain (remains above 5/10) will transfer patient to inpatient services for further evaluation and management  Code Status: Full  Family Communication: None  DVT Prophylaxis: Ambulate as tolerated   Time spent: 35 Minutes  Daryll Drown NP  If 7PM-7AM, please contact night-coverage [email or web address] 09/22/2023, 2:48 PM

## 2023-09-23 LAB — TYPE AND SCREEN
ABO/RH(D): B POS
Antibody Screen: NEGATIVE
Unit division: 0
Unit division: 0

## 2023-09-23 LAB — BPAM RBC
Blood Product Expiration Date: 202505112359
Blood Product Expiration Date: 202505112359
ISSUE DATE / TIME: 202504021212
ISSUE DATE / TIME: 202504021212
Unit Type and Rh: 5100
Unit Type and Rh: 5100

## 2023-09-27 ENCOUNTER — Other Ambulatory Visit: Payer: Self-pay | Admitting: Nurse Practitioner

## 2023-09-27 ENCOUNTER — Ambulatory Visit: Payer: Self-pay | Admitting: Nurse Practitioner

## 2023-09-27 ENCOUNTER — Other Ambulatory Visit: Payer: Self-pay

## 2023-10-05 ENCOUNTER — Other Ambulatory Visit: Payer: Self-pay | Admitting: Nurse Practitioner

## 2023-10-05 ENCOUNTER — Other Ambulatory Visit

## 2023-10-05 DIAGNOSIS — D571 Sickle-cell disease without crisis: Secondary | ICD-10-CM

## 2023-10-05 DIAGNOSIS — D5700 Hb-SS disease with crisis, unspecified: Secondary | ICD-10-CM

## 2023-10-06 ENCOUNTER — Telehealth: Payer: Self-pay

## 2023-10-06 NOTE — Telephone Encounter (Signed)
 Copied from CRM [id number]. Topic: Clinical - Lab/Test Results >> Oct 06, 2023  3:17 PM Oddis Bench wrote: Reason for CRM: Alejandro Soto is calling with critical lab results for patient.  Please advise [facility]

## 2023-10-07 LAB — CBC WITH DIFFERENTIAL/PLATELET
Basophils Absolute: 0 10*3/uL (ref 0.0–0.2)
Basos: 0 %
EOS (ABSOLUTE): 0 10*3/uL (ref 0.0–0.4)
Eos: 1 %
Hematocrit: 26.6 % — ABNORMAL LOW (ref 37.5–51.0)
Hemoglobin: 9.1 g/dL — ABNORMAL LOW (ref 13.0–17.7)
Immature Grans (Abs): 0 10*3/uL (ref 0.0–0.1)
Immature Granulocytes: 0 %
Lymphocytes Absolute: 4.5 10*3/uL — ABNORMAL HIGH (ref 0.7–3.1)
Lymphs: 53 %
MCH: 34.3 pg — ABNORMAL HIGH (ref 26.6–33.0)
MCHC: 34.2 g/dL (ref 31.5–35.7)
MCV: 100 fL — ABNORMAL HIGH (ref 79–97)
Monocytes Absolute: 0.6 10*3/uL (ref 0.1–0.9)
Monocytes: 6 %
NRBC: 17 % — ABNORMAL HIGH (ref 0–0)
Neutrophils Absolute: 3.5 10*3/uL (ref 1.4–7.0)
Neutrophils: 40 %
Platelets: 1057 10*3/uL (ref 150–450)
RBC: 2.65 x10E6/uL — CL (ref 4.14–5.80)
RDW: 17 % — ABNORMAL HIGH (ref 11.6–15.4)
WBC: 8.6 10*3/uL (ref 3.4–10.8)

## 2023-10-07 NOTE — Telephone Encounter (Signed)
 Pt was called no  answer and will try again later. KH

## 2023-10-08 ENCOUNTER — Other Ambulatory Visit: Payer: Self-pay | Admitting: Nurse Practitioner

## 2023-10-08 DIAGNOSIS — R7989 Other specified abnormal findings of blood chemistry: Secondary | ICD-10-CM

## 2023-10-10 LAB — TOXASSURE FLEX 15, UR
6-ACETYLMORPHINE IA: NEGATIVE ng/mL
AMPHETAMINES IA: NEGATIVE ng/mL
BARBITURATES IA: NEGATIVE ng/mL
CANNABINOIDS IA: NEGATIVE ng/mL
COCAINE METABOLITE IA: NEGATIVE ng/mL
Creatinine: 41 mg/dL
ETHYL ALCOHOL Enzymatic: NEGATIVE g/dL
Fentanyl: 15 ng/mg{creat}
METHADONE IA: NEGATIVE ng/mL
METHADONE MTB IA: NEGATIVE ng/mL
Norfentanyl: 268 ng/mg{creat}
PHENCYCLIDINE IA: NEGATIVE ng/mL
TAPENTADOL, IA: NEGATIVE ng/mL
TRAMADOL IA: NEGATIVE ng/mL

## 2023-10-10 LAB — OXYCODONE CLASS, MS, UR RFX
Noroxycodone: 2295 ng/mg{creat}
Noroxymorphone: 198 ng/mg{creat}
Oxycodone Class Confirmation: POSITIVE
Oxycodone: 1432 ng/mg{creat}
Oxymorphone: 932 ng/mg{creat}

## 2023-10-10 LAB — OPIATE CLASS, MS, UR RFX
Codeine: NOT DETECTED ng/mg{creat}
Dihydrocodeine: NOT DETECTED ng/mg{creat}
Hydrocodone: NOT DETECTED ng/mg{creat}
Hydromorphone: NOT DETECTED ng/mg{creat}
Morphine: NOT DETECTED ng/mg{creat}
Norcodeine: NOT DETECTED ng/mg{creat}
Norhydrocodone: NOT DETECTED ng/mg{creat}
Normorphine: NOT DETECTED ng/mg{creat}
Opiate Class Confirmation: NEGATIVE

## 2023-10-11 ENCOUNTER — Other Ambulatory Visit: Payer: Self-pay | Admitting: Nurse Practitioner

## 2023-10-11 DIAGNOSIS — D571 Sickle-cell disease without crisis: Secondary | ICD-10-CM

## 2023-10-11 MED ORDER — IBUPROFEN 800 MG PO TABS: 800.0000 mg | ORAL_TABLET | Freq: Three times a day (TID) | ORAL | 1 refills | Status: DC | PRN

## 2023-10-13 ENCOUNTER — Other Ambulatory Visit: Payer: Self-pay | Admitting: Nurse Practitioner

## 2023-10-13 DIAGNOSIS — D571 Sickle-cell disease without crisis: Secondary | ICD-10-CM

## 2023-10-18 ENCOUNTER — Other Ambulatory Visit: Payer: Self-pay | Admitting: Nurse Practitioner

## 2023-10-18 DIAGNOSIS — D5700 Hb-SS disease with crisis, unspecified: Secondary | ICD-10-CM

## 2023-10-18 MED ORDER — GABAPENTIN 300 MG PO CAPS: 300.0000 mg | ORAL_CAPSULE | Freq: Three times a day (TID) | ORAL | 3 refills | Status: DC

## 2023-10-22 ENCOUNTER — Other Ambulatory Visit: Payer: Self-pay | Admitting: Nurse Practitioner

## 2023-11-30 ENCOUNTER — Ambulatory Visit (INDEPENDENT_AMBULATORY_CARE_PROVIDER_SITE_OTHER): Payer: Medicare (Managed Care)

## 2023-11-30 VITALS — Ht 68.0 in | Wt 159.0 lb

## 2023-11-30 DIAGNOSIS — Z0000 Encounter for general adult medical examination without abnormal findings: Secondary | ICD-10-CM

## 2023-11-30 NOTE — Progress Notes (Signed)
 Subjective:   Alejandro Soto is a 38 y.o. male who presents for Medicare Annual/Subsequent preventive examination.  Visit Complete: Virtual I connected with  Alejandro Soto on 11/30/23 by a audio enabled telemedicine application and verified that I am speaking with the correct person using two identifiers.  Patient Location: Home  Provider Location: Office/Clinic  I discussed the limitations of evaluation and management by telemedicine. The patient expressed understanding and agreed to proceed.  Vital Signs: Because this visit was a virtual/telehealth visit, some criteria may be missing or patient reported. Any vitals not documented were not able to be obtained and vitals that have been documented are patient reported.  Patient Medicare AWV questionnaire was completed by the patient on 11/30/2023; I have confirmed that all information answered by patient is correct and no changes since this date.        Objective:     There were no vitals filed for this visit. There is no height or weight on file to calculate BMI.     09/22/2023   10:42 AM 10/01/2022   11:34 AM 07/31/2020   12:22 PM 03/28/2020    1:30 PM 09/11/2016   11:05 AM 07/14/2016   12:04 PM 07/14/2016   11:31 AM  Advanced Directives  Does Patient Have a Medical Advance Directive? No No No No No No No  Would patient like information on creating a medical advance directive? No - Patient declined No - Patient declined  No - Patient declined  No - Patient declined     Current Medications (verified) Outpatient Encounter Medications as of 11/30/2023  Medication Sig   folic acid  (FOLVITE ) 1 MG tablet Take 1 tablet (1 mg total) by mouth daily.   gabapentin  (NEURONTIN ) 300 MG capsule Take 1 capsule (300 mg total) by mouth 3 (three) times daily.   hydroxyurea  (HYDREA ) 500 MG capsule Take 3 capsules (1,500 mg total) by mouth daily. May take with food to minimize GI side effects.   ibuprofen  (ADVIL ) 800 MG tablet Take 1 tablet (800 mg  total) by mouth every 8 (eight) hours as needed for mild pain (pain score 1-3).   naloxone  (NARCAN ) nasal spray 4 mg/0.1 mL Use 1 nasal spray as a single dose in one nostril; may repeat with a new nasal spray every 2 to 3 minutes in alternating nostrils if needed for oopoidoverdose   Nutritional Supplements (EAA SUPPLEMENT PO) Take by mouth.   REXULTI 1 MG TABS  (Patient not taking: Reported on 03/03/2022)   No facility-administered encounter medications on file as of 11/30/2023.    Allergies (verified) Amoxicillin   History: Past Medical History:  Diagnosis Date   Insomnia 08/2019   Pneumonia    Sickle cell anemia (HCC)    Past Surgical History:  Procedure Laterality Date   NO PAST SURGERIES     Family History  Problem Relation Age of Onset   Cancer Maternal Grandmother        colon    Diabetes Father    Cancer Father 40       Prostate   Asthma Brother    Social History   Socioeconomic History   Marital status: Single    Spouse name: Not on file   Number of children: Not on file   Years of education: Not on file   Highest education level: Associate degree: occupational, Scientist, product/process development, or vocational program  Occupational History   Not on file  Tobacco Use   Smoking status: Former    Current packs/day:  0.33    Average packs/day: 0.3 packs/day for 5.0 years (1.7 ttl pk-yrs)    Types: Cigarettes   Smokeless tobacco: Never  Vaping Use   Vaping status: Never Used  Substance and Sexual Activity   Alcohol  use: No    Alcohol /week: 0.0 standard drinks of alcohol    Drug use: No   Sexual activity: Yes    Birth control/protection: Condom  Other Topics Concern   Not on file  Social History Narrative   Lives with his mother    Social Drivers of Health   Financial Resource Strain: Medium Risk (03/19/2023)   Overall Financial Resource Strain (CARDIA)    Difficulty of Paying Living Expenses: Somewhat hard  Food Insecurity: No Food Insecurity (03/19/2023)   Hunger Vital Sign     Worried About Running Out of Food in the Last Year: Never true    Ran Out of Food in the Last Year: Never true  Transportation Needs: No Transportation Needs (03/19/2023)   PRAPARE - Administrator, Civil Service (Medical): No    Lack of Transportation (Non-Medical): No  Recent Concern: Transportation Needs - Unmet Transportation Needs (12/22/2022)   PRAPARE - Administrator, Civil Service (Medical): Yes    Lack of Transportation (Non-Medical): No  Physical Activity: Sufficiently Active (03/19/2023)   Exercise Vital Sign    Days of Exercise per Week: 5 days    Minutes of Exercise per Session: 30 min  Stress: No Stress Concern Present (03/19/2023)   [employer] of Occupational Health - Occupational Stress Questionnaire    Feeling of Stress : Not at all  Social Connections: Moderately Integrated (03/19/2023)   Social Connection and Isolation Panel [NHANES]    Frequency of Communication with Friends and Family: More than three times a week    Frequency of Social Gatherings with Friends and Family: Once a week    Attends Religious Services: More than 4 times per year    Active Member of [employer] or Organizations: Yes    Attends Engineer, structural: More than 4 times per year    Marital Status: Never married    Tobacco Counseling Counseling given: Not Answered   Clinical Intake:                        Activities of Daily Living    09/22/2023   10:43 AM  In your present state of health, do you have any difficulty performing the following activities:  Hearing? 0  Vision? 0  Difficulty concentrating or making decisions? 0    Patient Care Team: Paseda, Folashade R, FNP as PCP - General (Nurse Practitioner)  Indicate any recent Medical Services you may have received from other than [facility] providers in the past year (date may be approximate).     Assessment:    This is a routine wellness examination for [facility].  Hearing/Vision  screen No results found.   Goals Addressed   None    Depression Screen    09/14/2023    9:01 AM 12/22/2022   10:46 AM 10/01/2022   11:30 AM 09/15/2022   11:46 AM 08/10/2022    3:19 PM 06/02/2022   10:53 AM 03/03/2022   10:35 AM  PHQ 2/9 Scores  PHQ - 2 Score 0 0 0 0  0 0  PHQ- 9 Score            Information is confidential and restricted. Go to Review Flowsheets to unlock data.  Fall Risk    10/01/2022   11:33 AM 09/28/2022    8:54 AM 03/03/2022   10:35 AM 09/08/2021   10:34 AM 07/11/2021   11:14 AM  Fall Risk   Falls in the past year? 0 0 0 0 0  Number falls in past yr: 0 0 0 0 0  Injury with Fall? 0  0 0 0  Risk for fall due to : No Fall Risks  No Fall Risks No Fall Risks   Follow up Falls prevention discussed  Falls evaluation completed Falls evaluation completed     MEDICARE RISK AT HOME:    TIMED UP AND GO:    Cognitive Function:        10/01/2022   11:35 AM  6CIT Screen  What Year? 0 points  What month? 0 points  What time? 0 points  Count back from 20 0 points  Months in reverse 0 points  Repeat phrase 0 points  Total Score 0 points    Immunizations Immunization History  Administered Date(s) Administered   Influenza Whole 03/21/2012   Influenza,inj,Quad PF,6+ Mos 03/21/2013, 03/01/2014   Pneumococcal Conjugate-13 03/21/2012   Pneumococcal Polysaccharide-23 07/15/2014   Tdap 08/14/2016    TDAP status: Up to date  Flu Vaccine status: Up to date  Pneumococcal vaccine status: Due, Education has been provided regarding the importance of this vaccine. Advised may receive this vaccine at local pharmacy or Health Dept. Aware to provide a copy of the vaccination record if obtained from local pharmacy or Health Dept. Verbalized acceptance and understanding.  Covid-19 vaccine status: Information provided on how to obtain vaccines.   Qualifies for Shingles Vaccine? No   Zostavax completed No   Shingrix Completed?: No.    Education has been provided  regarding the importance of this vaccine. Patient has been advised to call insurance company to determine out of pocket expense if they have not yet received this vaccine. Advised may also receive vaccine at local pharmacy or Health Dept. Verbalized acceptance and understanding.  Screening Tests Health Maintenance  Topic Date Due   COVID-19 Vaccine (1) Never done   Medicare Annual Wellness (AWV)  10/01/2023   INFLUENZA VACCINE  01/21/2024   DTaP/Tdap/Td (2 - Td or Tdap) 08/14/2026   HIV Screening  Completed   HPV VACCINES  Aged Out   Meningococcal B Vaccine  Aged Out   Pneumococcal Vaccine 22-76 Years old  Discontinued   Hepatitis C Screening  Discontinued    Health Maintenance  Health Maintenance Due  Topic Date Due   COVID-19 Vaccine (1) Never done   Medicare Annual Wellness (AWV)  10/01/2023     Lung Cancer Screening: (Low Dose CT Chest recommended if Age 29-80 years, 20 pack-year currently smoking OR have quit w/in 15years.) does not qualify.    Additional Screening:  Hepatitis C Screening: does qualify; Completed Never completed   Vision Screening: Recommended annual ophthalmology exams for early detection of glaucoma and other disorders of the eye. Is the patient up to date with their annual eye exam?  Yes  Who is the provider or what is the name of the office in which the patient attends annual eye exams? [facility]  If pt is not established with a provider, would they like to be referred to a provider to establish care? NA.   Dental Screening: Recommended annual dental exams for proper oral hygiene   Community Resource Referral / Chronic Care Management: CRR required this visit?  No   CCM  required this visit?  No     Plan:     I have personally reviewed and noted the following in the patient's chart:   Medical and social history Use of alcohol , tobacco or illicit drugs  Current medications and supplements including opioid prescriptions. Patient is  currently taking opioid prescriptions. Information provided to patient regarding non-opioid alternatives. Patient advised to discuss non-opioid treatment plan with their provider. Functional ability and status Nutritional status Physical activity Advanced directives List of other physicians Hospitalizations, surgeries, and ER visits in previous 12 months Vitals Screenings to include cognitive, depression, and falls Referrals and appointments  In addition, I have reviewed and discussed with patient certain preventive protocols, quality metrics, and best practice recommendations. A written personalized care plan for preventive services as well as general preventive health recommendations were provided to patient.     Bayron Dalto D Tonie Vizcarrondo, CMA   11/30/2023   After Visit Summary: (MyChart) Due to this being a telephonic visit, the after visit summary with patients personalized plan was offered to patient via MyChart   Nurse Notes: Thanks for your time today, hope you have a great day

## 2023-12-15 ENCOUNTER — Ambulatory Visit (INDEPENDENT_AMBULATORY_CARE_PROVIDER_SITE_OTHER): Payer: Medicare (Managed Care) | Admitting: Nurse Practitioner

## 2023-12-15 ENCOUNTER — Telehealth: Payer: Self-pay | Admitting: Nurse Practitioner

## 2023-12-15 ENCOUNTER — Encounter: Payer: Self-pay | Admitting: Nurse Practitioner

## 2023-12-15 VITALS — BP 119/65 | HR 54 | Temp 97.0°F | Wt 156.0 lb

## 2023-12-15 DIAGNOSIS — F1910 Other psychoactive substance abuse, uncomplicated: Secondary | ICD-10-CM

## 2023-12-15 DIAGNOSIS — K047 Periapical abscess without sinus: Secondary | ICD-10-CM | POA: Insufficient documentation

## 2023-12-15 DIAGNOSIS — D571 Sickle-cell disease without crisis: Secondary | ICD-10-CM

## 2023-12-15 DIAGNOSIS — G894 Chronic pain syndrome: Secondary | ICD-10-CM

## 2023-12-15 DIAGNOSIS — E559 Vitamin D deficiency, unspecified: Secondary | ICD-10-CM | POA: Diagnosis not present

## 2023-12-15 MED ORDER — GABAPENTIN 300 MG PO CAPS: 300.0000 mg | ORAL_CAPSULE | Freq: Three times a day (TID) | ORAL | 3 refills | Status: AC

## 2023-12-15 MED ORDER — IBUPROFEN 800 MG PO TABS: 800.0000 mg | ORAL_TABLET | Freq: Three times a day (TID) | ORAL | 1 refills | Status: DC | PRN

## 2023-12-15 MED ORDER — FOLIC ACID 1 MG PO TABS: 1.0000 mg | ORAL_TABLET | Freq: Every day | ORAL | 1 refills | Status: DC

## 2023-12-15 MED ORDER — VITAMIN D (ERGOCALCIFEROL) 1.25 MG (50000 UNIT) PO CAPS: 50000.0000 [IU] | ORAL_CAPSULE | ORAL | 1 refills | Status: DC

## 2023-12-15 NOTE — Patient Instructions (Addendum)
  Hematology  [facility] [address] [facility] [address] [place] [address]  P:  [phone] F:  [phone]   1. Vitamin D  deficiency (Primary)  - Vitamin D , Ergocalciferol , (DRISDOL ) 1.25 MG (50000 UNIT) CAPS capsule; Take 1 capsule (50,000 Units total) by mouth every 7 (seven) days.  Dispense: 12 capsule; Refill: 1  2. Chronic pain syndrome  - Ambulatory referral to Pain Clinic  3. Hb-SS disease without crisis (HCC)  - Sickle Cell Panel - ibuprofen  (ADVIL ) 800 MG tablet; Take 1 tablet (800 mg total) by mouth every 8 (eight) hours as needed for mild pain (pain score 1-3).  Dispense: 90 tablet; Refill: 1 - gabapentin  (NEURONTIN ) 300 MG capsule; Take 1 capsule (300 mg total) by mouth 3 (three) times daily.  Dispense: 90 capsule; Refill: 3 - folic acid  (FOLVITE ) 1 MG tablet; Take 1 tablet (1 mg total) by mouth daily.  Dispense: 90 tablet; Refill: 1       It is important that you exercise regularly at least 30 minutes 5 times a week as tolerated  Think about what you will eat, plan ahead. Choose  clean, green, fresh or frozen over canned, processed or packaged foods which are more sugary, salty and fatty. 70 to 75% of food eaten should be vegetables and fruit. Three meals at set times with snacks allowed between meals, but they must be fruit or vegetables. Aim to eat over a 12 hour period , example 7 am to 7 pm, and STOP after  your last meal of the day. Drink water,generally about 64 ounces per day, no other drink is as healthy. Fruit juice is best enjoyed in a healthy way, by EATING the fruit.  Thanks for choosing [facility] we consider it a privelige to serve you.

## 2023-12-15 NOTE — Progress Notes (Addendum)
 Established Patient Office Visit  Subjective:  Patient ID: Alejandro Soto, male    DOB: 1985/12/16  Age: 38 y.o. MRN: [id number]  CC:  Chief Complaint  Patient presents with   Sickle Cell Anemia    HPI Alejandro Soto is a 38 y.o. male  has a past medical history of DJD (degenerative joint disease), lumbosacral (09/18/2013), Insomnia (08/2019), Neuropathic pain (09/18/2013), Paranoid schizophrenia (HCC) (04/21/2012), Pneumonia, Sickle cell anemia (HCC), Substance abuse (HCC) (10/30/2022), and Vitamin D  deficiency (04/21/2012).  Patient present for follow-up for his chronic medical conditions  Sickle cell disease.  Currently on folic acid  1 mg daily, hydroxyurea  1500 mg daily He has stopped getting prescription for opiates due to polysubstance abuse.  He was prescribed gabapentin  but he did not pick up the prescription, takes ibuprofen  800 mg every 8 hours as needed.  He currently denies fever, chills, chest pain, shortness of breath abdominal pain nausea vomiting.  His sickle cell pain is mainly in his back and legs currently rated 6/10   Has upcoming tooth extraction procedure in August he saw dentist last week and he was prescribed antibiotics.  He has completed full course of antibiotics ordered.       Past Medical History:  Diagnosis Date   DJD (degenerative joint disease), lumbosacral 09/18/2013   Insomnia 08/2019   Neuropathic pain 09/18/2013   Paranoid schizophrenia (HCC) 04/21/2012   Pneumonia    Sickle cell anemia (HCC)    Substance abuse (HCC) 10/30/2022   Vitamin D  deficiency 04/21/2012    Past Surgical History:  Procedure Laterality Date   NO PAST SURGERIES      Family History  Problem Relation Age of Onset   Hypertension Mother    Diabetes Father    Cancer Father 63       Prostate   Asthma Brother    Sleep apnea Brother    Cancer Maternal Grandmother        colon     Social History   Socioeconomic History   Marital status: Single    Spouse name:  Not on file   Number of children: Not on file   Years of education: Not on file   Highest education level: Associate degree: occupational, Scientist, product/process development, or vocational program  Occupational History   Not on file  Tobacco Use   Smoking status: Former    Current packs/day: 0.33    Average packs/day: 0.3 packs/day for 5.0 years (1.7 ttl pk-yrs)    Types: Cigarettes   Smokeless tobacco: Never  Vaping Use   Vaping status: Some Days  Substance and Sexual Activity   Alcohol  use: No    Alcohol /week: 0.0 standard drinks of alcohol    Drug use: No   Sexual activity: Yes    Birth control/protection: Condom  Other Topics Concern   Not on file  Social History Narrative   Lives with his mother    Social Drivers of Health   Financial Resource Strain: Medium Risk (03/19/2023)   Overall Financial Resource Strain (CARDIA)    Difficulty of Paying Living Expenses: Somewhat hard  Food Insecurity: No Food Insecurity (03/19/2023)   Hunger Vital Sign    Worried About Running Out of Food in the Last Year: Never true    Ran Out of Food in the Last Year: Never true  Transportation Needs: No Transportation Needs (03/19/2023)   PRAPARE - Administrator, Civil Service (Medical): No    Lack of Transportation (Non-Medical): No  Recent Concern: Transportation Needs -  Unmet Transportation Needs (12/22/2022)   PRAPARE - Administrator, Civil Service (Medical): Yes    Lack of Transportation (Non-Medical): No  Physical Activity: Sufficiently Active (03/19/2023)   Exercise Vital Sign    Days of Exercise per Week: 5 days    Minutes of Exercise per Session: 30 min  Stress: No Stress Concern Present (03/19/2023)   [employer] of Occupational Health - Occupational Stress Questionnaire    Feeling of Stress : Not at all  Social Connections: Moderately Integrated (03/19/2023)   Social Connection and Isolation Panel    Frequency of Communication with Friends and Family: More than three times a  week    Frequency of Social Gatherings with Friends and Family: Once a week    Attends Religious Services: More than 4 times per year    Active Member of [employer] or Organizations: Yes    Attends Banker Meetings: More than 4 times per year    Marital Status: Never married  Intimate Partner Violence: Not At Risk (09/05/2023)   Received from [facility]   HITS    Over the last 12 months how often did your partner physically hurt you?: Never    Over the last 12 months how often did your partner insult you or talk down to you?: Never    Over the last 12 months how often did your partner threaten you with physical harm?: Never    Over the last 12 months how often did your partner scream or curse at you?: Never    Outpatient Medications Prior to Visit  Medication Sig Dispense Refill   hydroxyurea  (HYDREA ) 500 MG capsule Take 3 capsules (1,500 mg total) by mouth daily. May take with food to minimize GI side effects. 270 capsule 3   naloxone  (NARCAN ) nasal spray 4 mg/0.1 mL Use 1 nasal spray as a single dose in one nostril; may repeat with a new nasal spray every 2 to 3 minutes in alternating nostrils if needed for oopoidoverdose 1 each 1   Nutritional Supplements (EAA SUPPLEMENT PO) Take by mouth.     folic acid  (FOLVITE ) 1 MG tablet Take 1 tablet (1 mg total) by mouth daily. 90 tablet 1   ibuprofen  (ADVIL ) 800 MG tablet Take 1 tablet (800 mg total) by mouth every 8 (eight) hours as needed for mild pain (pain score 1-3). 90 tablet 1   oxyCODONE -acetaminophen  (PERCOCET) 10-325 MG tablet Take 1 tablet by mouth.     azithromycin  (ZITHROMAX ) 500 MG tablet Take 500 mg by mouth daily. (Patient not taking: Reported on 12/15/2023)     REXULTI 1 MG TABS  (Patient not taking: Reported on 12/15/2023)     gabapentin  (NEURONTIN ) 300 MG capsule Take 1 capsule (300 mg total) by mouth 3 (three) times daily. (Patient not taking: Reported on 12/15/2023) 90 capsule 3   Vitamin D , Ergocalciferol , (DRISDOL )  1.25 MG (50000 UNIT) CAPS capsule Take 50,000 Units by mouth every 7 (seven) days. (Patient not taking: Reported on 12/15/2023)     No facility-administered medications prior to visit.    Allergies  Allergen Reactions   Amoxicillin Diarrhea    Did it involve swelling of the face/tongue/throat, SOB, or low BP? No Did it involve sudden or severe rash/hives, skin peeling, or any reaction on the inside of your mouth or nose? No Did you need to seek medical attention at a hospital or doctor's office? No When did it last happen?      childhood If all above  answers are "NO", may proceed with cephalosporin use.     ROS Review of Systems  Constitutional:  Negative for appetite change, chills, fatigue and fever.  HENT:  Negative for congestion, postnasal drip, rhinorrhea and sneezing.   Respiratory:  Negative for cough, shortness of breath and wheezing.   Cardiovascular:  Negative for chest pain, palpitations and leg swelling.  Gastrointestinal:  Negative for abdominal pain, constipation, nausea and vomiting.  Genitourinary:  Negative for difficulty urinating, dysuria, flank pain and frequency.  Musculoskeletal:  Positive for arthralgias and back pain. Negative for joint swelling and myalgias.  Skin:  Negative for color change, pallor, rash and wound.  Neurological:  Negative for dizziness, facial asymmetry, weakness, numbness and headaches.  Psychiatric/Behavioral:  Negative for behavioral problems, confusion, self-injury and suicidal ideas.       Objective:    Physical Exam Vitals and nursing note reviewed.  Constitutional:      General: He is not in acute distress.    Appearance: Normal appearance. He is not ill-appearing, toxic-appearing or diaphoretic.   Eyes:     General: No scleral icterus.       Right eye: No discharge.        Left eye: No discharge.     Extraocular Movements: Extraocular movements intact.     Conjunctiva/sclera: Conjunctivae normal.    Cardiovascular:      Rate and Rhythm: Normal rate and regular rhythm.     Pulses: Normal pulses.     Heart sounds: Normal heart sounds. No murmur heard.    No friction rub. No gallop.  Pulmonary:     Effort: Pulmonary effort is normal. No respiratory distress.     Breath sounds: Normal breath sounds. No stridor. No wheezing, rhonchi or rales.  Chest:     Chest wall: No tenderness.  Abdominal:     General: There is no distension.     Palpations: Abdomen is soft.     Tenderness: There is no abdominal tenderness. There is no right CVA tenderness, left CVA tenderness or guarding.   Musculoskeletal:        General: No swelling, tenderness, deformity or signs of injury.     Right lower leg: No edema.     Left lower leg: No edema.   Skin:    General: Skin is warm and dry.     Capillary Refill: Capillary refill takes less than 2 seconds.     Coloration: Skin is not jaundiced or pale.     Findings: No bruising, erythema or lesion.   Neurological:     Mental Status: He is alert and oriented to person, place, and time.     Motor: No weakness.     Coordination: Coordination normal.     Gait: Gait normal.   Psychiatric:        Mood and Affect: Mood normal.        Behavior: Behavior normal.        Thought Content: Thought content normal.        Judgment: Judgment normal.     BP 119/65   Pulse (!) 54   Temp (!) 97 F (36.1 C)   Wt 156 lb (70.8 kg)   SpO2 100%   BMI 23.72 kg/m  Wt Readings from Last 3 Encounters:  12/15/23 156 lb (70.8 kg)  11/30/23 159 lb (72.1 kg)  09/14/23 159 lb 9.6 oz (72.4 kg)    Lab Results  Component Value Date   TSH 1.070 07/11/2021   Lab  Results  Component Value Date   WBC 8.6 10/05/2023   HGB 9.1 (L) 10/05/2023   HCT 26.6 (L) 10/05/2023   MCV 100 (H) 10/05/2023   PLT 1,057 (HH) 10/05/2023   Lab Results  Component Value Date   NA 135 09/14/2023   K 3.9 09/14/2023   CO2 23 09/14/2023   GLUCOSE 88 09/14/2023   BUN 8 09/14/2023   CREATININE 0.72 (L)  09/14/2023   BILITOT 0.6 09/14/2023   ALKPHOS 52 09/14/2023   AST 17 09/14/2023   ALT 10 09/14/2023   PROT 7.1 09/14/2023   ALBUMIN 4.2 09/14/2023   CALCIUM 8.7 09/14/2023   ANIONGAP 10 07/31/2020   EGFR 121 09/14/2023   Lab Results  Component Value Date   CHOL 136 04/10/2021   Lab Results  Component Value Date   HDL 31 (L) 04/10/2021   Lab Results  Component Value Date   LDLCALC 84 04/10/2021   Lab Results  Component Value Date   TRIG 111 04/10/2021   Lab Results  Component Value Date   CHOLHDL 4.4 04/10/2021   Lab Results  Component Value Date   HGBA1C <4.0 09/07/2013      Assessment & Plan:   Problem List Items Addressed This Visit       Digestive   Dental infection   Has completed full course of antibiotics ordered by dentist Advised to keep upcoming appointment for tooth extraction      Relevant Medications   azithromycin  (ZITHROMAX ) 500 MG tablet     Other   Vitamin D  deficiency - Primary   Vitamin D  50,000 units once weekly refilled      Relevant Medications   Vitamin D , Ergocalciferol , (DRISDOL ) 1.25 MG (50000 UNIT) CAPS capsule   Hb-SS disease without crisis (HCC)   We discussed the need for good hydration, monitoring of hydration status, avoidance of heat, cold, stress, and infection triggers. We discussed the need to be adherent with taking Hydrea  and other home medications. Patient was reminded of the need to seek medical attention immediately if any symptom of bleeding, anemia, or infection occurs.   Advised to take ibuprofen  800 mg 3 times daily as needed, gabapentin  300 mg 3 times daily for chronic pain He would like referral to pain management specialist referral placed       Relevant Medications   ibuprofen  (ADVIL ) 800 MG tablet   gabapentin  (NEURONTIN ) 300 MG capsule   folic acid  (FOLVITE ) 1 MG tablet   Other Relevant Orders   Sickle Cell Panel   Chronic pain syndrome   Relevant Medications   ibuprofen  (ADVIL ) 800 MG tablet    gabapentin  (NEURONTIN ) 300 MG capsule   Other Relevant Orders   Ambulatory referral to Pain Clinic   Substance abuse (HCC)   Advised to avoid use of illicit drugs       Meds ordered this encounter  Medications   Vitamin D , Ergocalciferol , (DRISDOL ) 1.25 MG (50000 UNIT) CAPS capsule    Sig: Take 1 capsule (50,000 Units total) by mouth every 7 (seven) days.    Dispense:  12 capsule    Refill:  1   ibuprofen  (ADVIL ) 800 MG tablet    Sig: Take 1 tablet (800 mg total) by mouth every 8 (eight) hours as needed for mild pain (pain score 1-3).    Dispense:  90 tablet    Refill:  1   gabapentin  (NEURONTIN ) 300 MG capsule    Sig: Take 1 capsule (300 mg total) by mouth 3 (three)  times daily.    Dispense:  90 capsule    Refill:  3   folic acid  (FOLVITE ) 1 MG tablet    Sig: Take 1 tablet (1 mg total) by mouth daily.    Dispense:  90 tablet    Refill:  1    Follow-up: Return in about 3 months (around 03/16/2024) for sickle cell disease.    Nygeria Lager R Kaley Jutras, FNP

## 2023-12-15 NOTE — Assessment & Plan Note (Signed)
 Advised to avoid use of illicit drugs

## 2023-12-15 NOTE — Addendum Note (Signed)
 Addended by: JUANICE THOMES SAUNDERS on: 12/15/2023 12:47 PM   Modules accepted: Orders

## 2023-12-15 NOTE — Assessment & Plan Note (Signed)
 Vitamin D 50,000 units once weekly refilled

## 2023-12-15 NOTE — Assessment & Plan Note (Signed)
 Has completed full course of antibiotics ordered by dentist Advised to keep upcoming appointment for tooth extraction

## 2023-12-15 NOTE — Telephone Encounter (Signed)
 Copied from CRM [id number]. Topic: General - Other >> Dec 15, 2023  1:18 PM Turkey B wrote: Reason for CRM: pt called in states wanted to tell Suzen at the office, that the dosage of herbal supplement is 1 tablespoon a day

## 2023-12-15 NOTE — Assessment & Plan Note (Signed)
 We discussed the need for good hydration, monitoring of hydration status, avoidance of heat, cold, stress, and infection triggers. We discussed the need to be adherent with taking Hydrea  and other home medications. Patient was reminded of the need to seek medical attention immediately if any symptom of bleeding, anemia, or infection occurs.   Advised to take ibuprofen  800 mg 3 times daily as needed, gabapentin  300 mg 3 times daily for chronic pain He would like referral to pain management specialist referral placed

## 2023-12-16 LAB — CMP14+CBC/D/PLT+FER+RETIC+V...
ALT: 9 IU/L (ref 0–44)
AST: 18 IU/L (ref 0–40)
Albumin: 4.2 g/dL (ref 4.1–5.1)
Alkaline Phosphatase: 53 IU/L (ref 44–121)
BUN/Creatinine Ratio: 12 (ref 9–20)
BUN: 8 mg/dL (ref 6–20)
Basophils Absolute: 0 10*3/uL (ref 0.0–0.2)
Basos: 0 %
Bilirubin Total: 0.6 mg/dL (ref 0.0–1.2)
CO2: 20 mmol/L (ref 20–29)
Calcium: 8.8 mg/dL (ref 8.7–10.2)
Chloride: 101 mmol/L (ref 96–106)
Creatinine, Ser: 0.69 mg/dL — ABNORMAL LOW (ref 0.76–1.27)
EOS (ABSOLUTE): 0.2 10*3/uL (ref 0.0–0.4)
Eos: 1 %
Ferritin: 397 ng/mL (ref 30–400)
Globulin, Total: 2.9 g/dL (ref 1.5–4.5)
Glucose: 81 mg/dL (ref 70–99)
Hematocrit: 27.6 % — ABNORMAL LOW (ref 37.5–51.0)
Hemoglobin: 9.3 g/dL — ABNORMAL LOW (ref 13.0–17.7)
Immature Grans (Abs): 0 10*3/uL (ref 0.0–0.1)
Immature Granulocytes: 0 %
Lymphocytes Absolute: 4.5 10*3/uL — ABNORMAL HIGH (ref 0.7–3.1)
Lymphs: 43 %
MCH: 36.3 pg — ABNORMAL HIGH (ref 26.6–33.0)
MCHC: 33.7 g/dL (ref 31.5–35.7)
MCV: 108 fL — ABNORMAL HIGH (ref 79–97)
Monocytes Absolute: 0.5 10*3/uL (ref 0.1–0.9)
Monocytes: 4 %
NRBC: 15 % — ABNORMAL HIGH (ref 0–0)
Neutrophils Absolute: 5.4 10*3/uL (ref 1.4–7.0)
Neutrophils: 52 %
Platelets: 511 10*3/uL — ABNORMAL HIGH (ref 150–450)
Potassium: 4.5 mmol/L (ref 3.5–5.2)
RBC: 2.56 x10E6/uL — CL (ref 4.14–5.80)
RDW: 18.2 % — ABNORMAL HIGH (ref 11.6–15.4)
Retic Ct Pct: 5.4 % — ABNORMAL HIGH (ref 0.6–2.6)
Sodium: 137 mmol/L (ref 134–144)
Total Protein: 7.1 g/dL (ref 6.0–8.5)
Vit D, 25-Hydroxy: 21.1 ng/mL — ABNORMAL LOW (ref 30.0–100.0)
WBC: 10.6 10*3/uL (ref 3.4–10.8)
eGFR: 122 mL/min/{1.73_m2} (ref >59)

## 2023-12-16 NOTE — Telephone Encounter (Signed)
Advised KH 

## 2023-12-17 ENCOUNTER — Ambulatory Visit: Payer: Self-pay | Admitting: Nurse Practitioner

## 2023-12-25 ENCOUNTER — Other Ambulatory Visit: Payer: Self-pay | Admitting: Nurse Practitioner

## 2023-12-25 DIAGNOSIS — G894 Chronic pain syndrome: Secondary | ICD-10-CM

## 2023-12-25 DIAGNOSIS — D571 Sickle-cell disease without crisis: Secondary | ICD-10-CM

## 2024-02-09 ENCOUNTER — Telehealth (HOSPITAL_COMMUNITY): Payer: Self-pay

## 2024-02-09 ENCOUNTER — Non-Acute Institutional Stay (HOSPITAL_COMMUNITY)
Admission: AD | Admit: 2024-02-09 | Discharge: 2024-02-09 | Disposition: A | Discharge status: Home / Self Care | Payer: Medicare (Managed Care) | Source: Ambulatory Visit | Attending: Internal Medicine | Admitting: Internal Medicine

## 2024-02-09 DIAGNOSIS — G894 Chronic pain syndrome: Secondary | ICD-10-CM | POA: Diagnosis not present

## 2024-02-09 DIAGNOSIS — D5700 Hb-SS disease with crisis, unspecified: Secondary | ICD-10-CM | POA: Diagnosis present

## 2024-02-09 DIAGNOSIS — F1120 Opioid dependence, uncomplicated: Secondary | ICD-10-CM | POA: Insufficient documentation

## 2024-02-09 LAB — COMPREHENSIVE METABOLIC PANEL WITH GFR
ALT: 14 U/L (ref 0–44)
AST: 23 U/L (ref 15–41)
Albumin: 4.1 g/dL (ref 3.5–5.0)
Alkaline Phosphatase: 51 U/L (ref 38–126)
Anion gap: 7 (ref 5–15)
BUN: 7 mg/dL (ref 6–20)
CO2: 27 mmol/L (ref 22–32)
Calcium: 9.1 mg/dL (ref 8.9–10.3)
Chloride: 101 mmol/L (ref 98–111)
Creatinine, Ser: 0.61 mg/dL (ref 0.61–1.24)
GFR, Estimated: >60 mL/min (ref >60)
Glucose, Bld: 103 mg/dL — ABNORMAL HIGH (ref 70–99)
Potassium: 3.5 mmol/L (ref 3.5–5.1)
Sodium: 135 mmol/L (ref 135–145)
Total Bilirubin: 1 mg/dL (ref 0.0–1.2)
Total Protein: 8 g/dL (ref 6.5–8.1)

## 2024-02-09 LAB — RETICULOCYTES
Immature Retic Fract: 42.9 % — ABNORMAL HIGH (ref 2.3–15.9)
RBC.: 2.47 MIL/uL — ABNORMAL LOW (ref 4.22–5.81)
Retic Count, Absolute: 148.7 K/uL (ref 19.0–186.0)
Retic Ct Pct: 6 % — ABNORMAL HIGH (ref 0.4–3.1)

## 2024-02-09 LAB — CBC WITH DIFFERENTIAL/PLATELET
Abs Granulocyte: 6 K/uL (ref 1.5–6.5)
Abs Immature Granulocytes: 0.04 K/uL (ref 0.00–0.07)
Basophils Absolute: 0 K/uL (ref 0.0–0.1)
Basophils Relative: 0 %
Eosinophils Absolute: 0.2 K/uL (ref 0.0–0.5)
Eosinophils Relative: 2 %
HCT: 25.4 % — ABNORMAL LOW (ref 39.0–52.0)
Hemoglobin: 8.8 g/dL — ABNORMAL LOW (ref 13.0–17.0)
Immature Granulocytes: 0 %
Lymphocytes Relative: 29 %
Lymphs Abs: 2.9 K/uL (ref 0.7–4.0)
MCH: 35.3 pg — ABNORMAL HIGH (ref 26.0–34.0)
MCHC: 34.6 g/dL (ref 30.0–36.0)
MCV: 102 fL — ABNORMAL HIGH (ref 80.0–100.0)
Monocytes Absolute: 0.8 K/uL (ref 0.1–1.0)
Monocytes Relative: 8 %
Neutro Abs: 6 K/uL (ref 1.7–7.7)
Neutrophils Relative %: 61 %
Platelets: 665 K/uL — ABNORMAL HIGH (ref 150–400)
RBC: 2.49 MIL/uL — ABNORMAL LOW (ref 4.22–5.81)
RDW: 15.9 % — ABNORMAL HIGH (ref 11.5–15.5)
Smear Review: NORMAL
WBC: 10 K/uL (ref 4.0–10.5)
nRBC: 9.7 % — ABNORMAL HIGH (ref 0.0–0.2)

## 2024-02-09 LAB — RAPID URINE DRUG SCREEN, HOSP PERFORMED
Amphetamines: NOT DETECTED
Barbiturates: NOT DETECTED
Benzodiazepines: NOT DETECTED
Cocaine: POSITIVE — AB
Opiates: NOT DETECTED
Tetrahydrocannabinol: NOT DETECTED

## 2024-02-09 LAB — LACTATE DEHYDROGENASE: LDH: 215 U/L — ABNORMAL HIGH (ref 98–192)

## 2024-02-09 MED ORDER — SODIUM CHLORIDE 0.9% FLUSH: 9.0000 mL | INTRAVENOUS | Status: DC | PRN

## 2024-02-09 MED ORDER — DIPHENHYDRAMINE HCL 25 MG PO CAPS: 25.0000 mg | ORAL_CAPSULE | ORAL | Status: DC | PRN

## 2024-02-09 MED ORDER — POLYETHYLENE GLYCOL 3350 17 G PO PACK: 17.0000 g | PACK | Freq: Every day | ORAL | Status: DC | PRN

## 2024-02-09 MED ORDER — OXYCODONE HCL 5 MG PO TABS
10.0000 mg | ORAL_TABLET | ORAL | Status: DC | PRN
  Administered 2024-02-09 (×2): 10 mg via ORAL
  Filled 2024-02-09 (×2): qty 2

## 2024-02-09 MED ORDER — NALOXONE HCL 0.4 MG/ML IJ SOLN: 0.4000 mg | INTRAMUSCULAR | Status: DC | PRN

## 2024-02-09 MED ORDER — HYDROMORPHONE 1 MG/ML IV SOLN: INTRAVENOUS | Status: DC

## 2024-02-09 MED ORDER — SODIUM CHLORIDE 0.45 % IV SOLN: INTRAVENOUS | Status: DC

## 2024-02-09 MED ORDER — ACETAMINOPHEN 500 MG PO TABS
1000.0000 mg | ORAL_TABLET | Freq: Once | ORAL | Status: AC
  Administered 2024-02-09: 1000 mg via ORAL
  Filled 2024-02-09: qty 2

## 2024-02-09 MED ORDER — SENNOSIDES-DOCUSATE SODIUM 8.6-50 MG PO TABS
1.0000 | ORAL_TABLET | Freq: Two times a day (BID) | ORAL | Status: DC
  Administered 2024-02-09: 1 via ORAL
  Filled 2024-02-09: qty 1

## 2024-02-09 MED ORDER — ONDANSETRON HCL 4 MG/2ML IJ SOLN: 4.0000 mg | Freq: Four times a day (QID) | INTRAMUSCULAR | Status: DC | PRN

## 2024-02-09 MED ORDER — KETOROLAC TROMETHAMINE 15 MG/ML IJ SOLN
15.0000 mg | Freq: Four times a day (QID) | INTRAMUSCULAR | Status: DC
  Administered 2024-02-09: 15 mg via INTRAVENOUS
  Filled 2024-02-09: qty 1

## 2024-02-09 NOTE — Discharge Summary (Signed)
 Physician Discharge Summary  Alejandro Soto FMW:[id number] DOB: 1985/12/24 DOA: 02/09/2024  PCP: Paseda, Folashade R, FNP  Admit date: 02/09/2024  Discharge date: 02/09/2024  Time spent: 30 minutes  Discharge Diagnoses:  Active Problems:   Sickle cell anemia with pain (HCC)   Discharge Condition: Stable  Diet recommendation: Regular  History of present illness:   Alejandro Soto is a 38 y.o. male with history of sickle cell disease,  chronic pain syndrome, opiate dependence and tolerance, and history of anemia of chronic disease presents to the day hospital with complaints of allover body pain that is consistent with his typical pain crisis. Patient has fairly well-controlled sickle cell disease with infrequent pain crises. Patient says the pain intensity has been increasing over the past week. He has been attempting to manage pain with home medications without any relief. Patient says that pain is mostly to low back and lower extremities and he rates pain at 8/10. He characterizes pain as constant and aching. He denies any fever, chills, chest pain, urinary symptoms, nausea, vomiting, or diarrhea. No sick contacts, recent travel,    Hospital Course:  Alejandro Soto was admitted to the day hospital with sickle cell painful crisis. Patient was treated with IV fluid, PO Oxycodone  10 mg q 3 hr PRN, , IV Toradol , clinician assisted doses as deemed appropriate, and other adjunct therapies per sickle cell pain management protocol. Alejandro Soto showed significant improvement symptomatically, pain improved from 8/10 to  4/10 at the time of discharge. Patient was discharged home in a hemodynamically stable condition. Alejandro Soto, Alejandro with home medications as per prior to admission.  Discharge Instructions We discussed the need for good hydration, monitoring of hydration status, avoidance of heat, cold, stress, and infection triggers. We discussed the need to be  compliant with taking home medications. Alejandro Soto was reminded of the need to seek medical attention immediately if any symptom of bleeding, anemia, or infection occurs.  Discharge Exam: Vitals:   02/09/24 1224 02/09/24 1425  BP: (!) 112/59 119/71  Pulse:  67  Resp:  14  Temp:    SpO2:  98%    General appearance: alert, cooperative and no distress Eyes: conjunctivae/corneas clear. PERRL, EOM's intact. Fundi benign. Neck: no adenopathy, no carotid bruit, no JVD, supple, symmetrical, trachea midline and thyroid  not enlarged, symmetric, no tenderness/mass/nodules Back: symmetric, no curvature. ROM normal. No CVA tenderness. Resp: clear to auscultation bilaterally Chest wall: no tenderness Cardio: regular rate and rhythm, S1, S2 normal, no murmur, click, rub or gallop GI: soft, non-tender; bowel sounds normal; no masses,  no organomegaly Extremities: extremities normal, atraumatic, no cyanosis or edema Pulses: 2+ and symmetric Skin: Skin color, texture, turgor normal. No rashes or lesions Neurologic: Grossly normal  Discharge Instructions     Call MD for:  severe uncontrolled pain   Complete by: As directed    Call MD for:  temperature >100.4   Complete by: As directed    Diet - low sodium heart healthy   Complete by: As directed    Increase activity slowly   Complete by: As directed       Allergies as of 02/09/2024       Reactions   Amoxicillin Diarrhea   Did it involve swelling of the face/tongue/throat, SOB, or low BP? No Did it involve sudden or severe rash/hives, skin peeling, or any reaction on the inside of your mouth or nose? No Did you need to seek medical attention at a hospital  or doctor's office? No When did it last happen?      childhood If all above answers are "NO", may proceed with cephalosporin use.        Medication List     TAKE these medications    azithromycin  500 MG tablet Commonly known as: ZITHROMAX  Take 500 mg by mouth daily.   EAA  SUPPLEMENT PO Take by mouth.   folic acid  1 MG tablet Commonly known as: FOLVITE  Take 1 tablet (1 mg total) by mouth daily.   gabapentin  300 MG capsule Commonly known as: NEURONTIN  Take 1 capsule (300 mg total) by mouth 3 (three) times daily.   hydroxyurea  500 MG capsule Commonly known as: HYDREA  Take 3 capsules (1,500 mg total) by mouth daily. May take with food to minimize GI side effects.   ibuprofen  800 MG tablet Commonly known as: ADVIL  Take 1 tablet (800 mg total) by mouth every 8 (eight) hours as needed for mild pain (pain score 1-3).   naloxone  4 MG/0.1ML Liqd nasal spray kit Commonly known as: NARCAN  Use 1 nasal spray as a single dose in one nostril; may repeat with a new nasal spray every 2 to 3 minutes in alternating nostrils if needed for oopoidoverdose   Rexulti 1 MG Tabs tablet Generic drug: brexpiprazole   Vitamin D  (Ergocalciferol ) 1.25 MG (50000 UNIT) Caps capsule Commonly known as: DRISDOL  Take 1 capsule (50,000 Units total) by mouth every 7 (seven) days.       Allergies  Allergen Reactions   Amoxicillin Diarrhea    Did it involve swelling of the face/tongue/throat, SOB, or low BP? No Did it involve sudden or severe rash/hives, skin peeling, or any reaction on the inside of your mouth or nose? No Did you need to seek medical attention at a hospital or doctor's office? No When did it last happen?      childhood If all above answers are "NO", may proceed with cephalosporin use.      Significant Diagnostic Studies: No results found.  Signed:  Homer Soto Cover NP  02/09/2024, 3:30 PM

## 2024-02-09 NOTE — Progress Notes (Addendum)
 Pt admitted to the day hospital by Homer Cover FNP for treatment of sickle cell pain crisis. Pt reported pain to R arm rated 8/10. Labs and UDS obtained per orders. Pt given PO Tylenol , IV toradol , and hydrated with 1/2 NS IV fluids. Pt received 10mg  Oxycodone  x 2 doses 3 hours apart. At discharge patient reported pain a 4/10. Pt declined printed AVS, provided  with work excuse. Pt alert , oriented and ambulatory at discharge. Transportation to pt's home arranged by staff.

## 2024-02-09 NOTE — Telephone Encounter (Signed)
 Patient called in. Complains of pain in R arm rates 8/10. Denied chest pain, abd pain, fever, N/V/D, or priapism. Wants to come in for treatment. Last took gabapentin  and ibuprofen  at 6:30am, he states this is what he usually takes to manage his crises. Pt states he has not had an oxycodone  prescription for a while. Pt denies recent ED visits. Pt states he needs transportation arranged to and from center. Onyeje FNP notified, approved for pt to be seen in the day hospital. Pt made aware, verbalized understanding. Confirmed address with pt, and taxi arranged by staff.

## 2024-02-09 NOTE — H&P (Signed)
 [facility] History and Physical  Alejandro Soto FMW:[id number] DOB: May 30, 1986 DOA: 02/09/2024  PCP: Paseda, Folashade R, FNP   Chief Complaint:  Chief Complaint  Patient presents with   Sickle Cell Pain Crisis    HPI: Alejandro Soto is a 38 y.o. male with history of sickle cell disease,  chronic pain syndrome, opiate dependence and tolerance, and history of anemia of chronic disease presents to the day hospital with complaints of allover body pain that is consistent with his typical pain crisis. Patient has fairly well-controlled sickle cell disease with infrequent pain crises. Patient says the pain intensity has been increasing over the past week. He has been attempting to manage pain with home medications without any relief. Patient says that pain is mostly to low back and lower extremities and he rates pain at 8/10. He characterizes pain as constant and aching. He denies any fever, chills, chest pain, urinary symptoms, nausea, vomiting, or diarrhea. No sick contacts, recent travel,   Systemic Review: General: The patient denies anorexia, fever, weight loss Cardiac: Denies chest pain, syncope, palpitations, pedal edema  Respiratory: Denies cough, shortness of breath, wheezing GI: Denies severe indigestion/heartburn, abdominal pain, nausea, vomiting, diarrhea and constipation GU: Denies hematuria, incontinence, dysuria  Musculoskeletal: Denies arthritis  Skin: Denies suspicious skin lesions Neurologic: Denies focal weakness or numbness, change in vision  Past Medical History:  Diagnosis Date   DJD (degenerative joint disease), lumbosacral 09/18/2013   Insomnia 08/2019   Neuropathic pain 09/18/2013   Paranoid schizophrenia (HCC) 04/21/2012   Pneumonia    Sickle cell anemia (HCC)    Substance abuse (HCC) 10/30/2022   Vitamin D  deficiency 04/21/2012    Past Surgical History:  Procedure Laterality Date   NO PAST SURGERIES      Allergies  Allergen Reactions    Amoxicillin Diarrhea    Did it involve swelling of the face/tongue/throat, SOB, or low BP? No Did it involve sudden or severe rash/hives, skin peeling, or any reaction on the inside of your mouth or nose? No Did you need to seek medical attention at a hospital or doctor's office? No When did it last happen?      childhood If all above answers are "NO", may proceed with cephalosporin use.     Family History  Problem Relation Age of Onset   Hypertension Mother    Diabetes Father    Cancer Father 58       Prostate   Asthma Brother    Sleep apnea Brother    Cancer Maternal Grandmother        colon       Prior to Admission medications   Medication Sig Start Date End Date Taking? Authorizing Provider  azithromycin  (ZITHROMAX ) 500 MG tablet Take 500 mg by mouth daily. Patient not taking: Reported on 12/15/2023 12/09/23   [provider]  folic acid  (FOLVITE ) 1 MG tablet Take 1 tablet (1 mg total) by mouth daily. 12/15/23   Paseda, Folashade R, FNP  gabapentin  (NEURONTIN ) 300 MG capsule Take 1 capsule (300 mg total) by mouth 3 (three) times daily. 12/15/23   Paseda, Folashade R, FNP  hydroxyurea  (HYDREA ) 500 MG capsule Take 3 capsules (1,500 mg total) by mouth daily. May take with food to minimize GI side effects. 06/29/23 06/28/24  Paseda, Folashade R, FNP  ibuprofen  (ADVIL ) 800 MG tablet Take 1 tablet (800 mg total) by mouth every 8 (eight) hours as needed for mild pain (pain score 1-3). 12/15/23   Paseda, Folashade R, FNP  naloxone  (NARCAN ) nasal spray 4 mg/0.1 mL Use 1 nasal spray as a single dose in one nostril; may repeat with a new nasal spray every 2 to 3 minutes in alternating nostrils if needed for oopoidoverdose 06/29/23   Paseda, Folashade R, FNP  Nutritional Supplements (EAA SUPPLEMENT PO) Take by mouth.    [provider]  REXULTI 1 MG TABS  09/01/18   [provider]  Vitamin D , Ergocalciferol , (DRISDOL ) 1.25 MG (50000 UNIT) CAPS capsule Take 1 capsule (50,000  Units total) by mouth every 7 (seven) days. 12/15/23   Paseda, Folashade R, FNP     Physical Exam: Vitals:   02/09/24 1045 02/09/24 1046 02/09/24 1222 02/09/24 1224  BP:  120/67 (!) 117/53 (!) 112/59  Pulse:  68 65   Resp:  16 16   Temp: 98 F (36.7 C) 98.5 F (36.9 C) 97.7 F (36.5 C)   TempSrc:  Temporal Temporal   SpO2:  100% 100%     General: Alert, awake, afebrile, anicteric, not in obvious distress HEENT: Normocephalic and Atraumatic, Mucous membranes pink                PERRLA; EOM intact; No scleral icterus,                 Nares: Patent, Oropharynx: Clear, Fair Dentition                 Neck: FROM, no cervical lymphadenopathy, thyromegaly, carotid bruit or JVD;  CHEST WALL: No tenderness  CHEST: Normal respiration, clear to auscultation bilaterally  HEART: Regular rate and rhythm; no murmurs rubs or gallops  BACK: No kyphosis or scoliosis; no CVA tenderness  ABDOMEN: Positive Bowel Sounds, soft, non-tender; no masses, no organomegaly EXTREMITIES: No cyanosis, clubbing, or edema SKIN:  no rash or ulceration  CNS: Alert and Oriented x 4, Nonfocal exam, CN 2-12 intact  Labs on Admission:  Basic Metabolic Panel: Recent Labs  Lab 02/09/24 1010  NA 135  K 3.5  CL 101  CO2 27  GLUCOSE 103*  BUN 7  CREATININE 0.61  CALCIUM 9.1   Liver Function Tests: Recent Labs  Lab 02/09/24 1010  AST 23  ALT 14  ALKPHOS 51  BILITOT 1.0  PROT 8.0  ALBUMIN 4.1   No results for input(s): LIPASE, AMYLASE in the last 168 hours. No results for input(s): AMMONIA in the last 168 hours. CBC: Recent Labs  Lab 02/09/24 1010  WBC 10.0  NEUTROABS 6.0  HGB 8.8*  HCT 25.4*  MCV 102.0*  PLT 665*   Cardiac Enzymes: No results for input(s): CKTOTAL, CKMB, CKMBINDEX, TROPONINI in the last 168 hours.  BNP (last 3 results) No results for input(s): BNP in the last 8760 hours.  ProBNP (last 3 results) No results for input(s): PROBNP in the last 8760  hours.  CBG: No results for input(s): GLUCAP in the last 168 hours.   Assessment/Plan Active Problems:   Sickle cell anemia with pain (HCC)  Admits to the Day Hospital for extended observation IVF 0.45% Saline @ 125 mls/hour Patient is positive for cocaine  so no IV PCA, PO Oxycodone  10 mg q 3 hrs PRN  IV Toradol  15 mg X 1 doses Acetaminophen  1000 mg x 1 dose Labs: CBCD, CMP, Retic Count and LDH Monitor vitals very closely, Re-evaluate pain scale every hour 2 L of Oxygen  by [place] Patient will be re-evaluated for pain in the context of function and relationship to baseline as care progresses. If no significant relieve  from pain (remains above 5/10) will transfer patient to inpatient services for further evaluation and management  Code Status: Full  Family Communication: None  DVT Prophylaxis: Ambulate as tolerated   Time spent: 35 Minutes  Homer CHRISTELLA Cover NP   If 7PM-7AM, please contact night-coverage [email or web address] 02/09/2024, 1:58 PM

## 2024-03-17 ENCOUNTER — Encounter: Payer: Self-pay | Admitting: Nurse Practitioner

## 2024-03-17 ENCOUNTER — Ambulatory Visit (INDEPENDENT_AMBULATORY_CARE_PROVIDER_SITE_OTHER): Payer: Medicare (Managed Care) | Admitting: Nurse Practitioner

## 2024-03-17 VITALS — BP 125/74 | HR 100 | Temp 96.8°F | Wt 159.0 lb

## 2024-03-17 DIAGNOSIS — D571 Sickle-cell disease without crisis: Secondary | ICD-10-CM

## 2024-03-17 DIAGNOSIS — E559 Vitamin D deficiency, unspecified: Secondary | ICD-10-CM | POA: Diagnosis not present

## 2024-03-17 DIAGNOSIS — R519 Headache, unspecified: Secondary | ICD-10-CM | POA: Insufficient documentation

## 2024-03-17 DIAGNOSIS — F1910 Other psychoactive substance abuse, uncomplicated: Secondary | ICD-10-CM

## 2024-03-17 DIAGNOSIS — G894 Chronic pain syndrome: Secondary | ICD-10-CM | POA: Diagnosis not present

## 2024-03-17 MED ORDER — VITAMIN D (ERGOCALCIFEROL) 1.25 MG (50000 UNIT) PO CAPS: 50000.0000 [IU] | ORAL_CAPSULE | ORAL | 1 refills | Status: DC

## 2024-03-17 MED ORDER — IBUPROFEN 800 MG PO TABS: 800.0000 mg | ORAL_TABLET | Freq: Three times a day (TID) | ORAL | 1 refills | Status: AC | PRN

## 2024-03-17 MED ORDER — FOLIC ACID 1 MG PO TABS: 1.0000 mg | ORAL_TABLET | Freq: Every day | ORAL | 1 refills | Status: DC

## 2024-03-17 MED ORDER — HYDROXYUREA 500 MG PO CAPS: 1500.0000 mg | ORAL_CAPSULE | Freq: Every day | ORAL | 3 refills | Status: AC

## 2024-03-17 NOTE — Assessment & Plan Note (Signed)
 Last vitamin D  Lab Results  Component Value Date   VD25OH 21.1 (L) 12/15/2023  Continue vitamin D  50,000 units once weekly

## 2024-03-17 NOTE — Patient Instructions (Addendum)
 Pain management  [facility] [address] [place] [address] [phone]   It is important that you exercise regularly at least 30 minutes 5 times a week as tolerated  Think about what you will eat, plan ahead. Choose  clean, green, fresh or frozen over canned, processed or packaged foods which are more sugary, salty and fatty. 70 to 75% of food eaten should be vegetables and fruit. Three meals at set times with snacks allowed between meals, but they must be fruit or vegetables. Aim to eat over a 12 hour period , example 7 am to 7 pm, and STOP after  your last meal of the day. Drink water,generally about 64 ounces per day, no other drink is as healthy. Fruit juice is best enjoyed in a healthy way, by EATING the fruit.  Thanks for choosing [facility] we consider it a privelige to serve you.

## 2024-03-17 NOTE — Assessment & Plan Note (Addendum)
 Has infrequent crisis We discussed the need for good hydration, monitoring of hydration status, avoidance of heat, cold, stress, and infection triggers. We discussed the need to be adherent with taking Hydrea  1500 mg daily, folic acid  1 mg daily and other home medications. Patient was reminded of the need to seek medical attention immediately if any symptom of bleeding, anemia, or infection occurs.

## 2024-03-17 NOTE — Assessment & Plan Note (Signed)
 Chronic pain management is complicated by cocaine  use disorder history, limiting current options. - Refer to [facility] in [facility] for pain management. - Provide contact information for [facility]. - Encourage continued abstinence from drug use. - Offer support and resources for drug rehabilitation if needed.

## 2024-03-17 NOTE — Assessment & Plan Note (Addendum)
 Discussed risks of drug use, including overdose and heart disease. Offered drug rehabilitation information, but he declined. - Encourage continued abstinence from drug use. - Offer support and resources for drug rehabilitation if needed.
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
## 2024-03-17 NOTE — Assessment & Plan Note (Signed)
  Headache possibly related to lack of glasses and work stress. - Recommend obtaining new glasses using existing prescription. - Suggest using ibuprofen  or Tylenol  as needed for headache relief.

## 2024-03-18 LAB — CMP14+CBC/D/PLT+FER+RETIC+V...
ALT: 10 IU/L (ref 0–44)
AST: 19 IU/L (ref 0–40)
Albumin: 4.6 g/dL (ref 4.1–5.1)
Alkaline Phosphatase: 51 IU/L (ref 47–123)
BUN/Creatinine Ratio: 9 (ref 9–20)
BUN: 5 mg/dL — ABNORMAL LOW (ref 6–20)
Basophils Absolute: 0.1 x10E3/uL (ref 0.0–0.2)
Basos: 1 %
Bilirubin Total: 0.6 mg/dL (ref 0.0–1.2)
CO2: 23 mmol/L (ref 20–29)
Calcium: 9.2 mg/dL (ref 8.7–10.2)
Chloride: 101 mmol/L (ref 96–106)
Creatinine, Ser: 0.57 mg/dL — ABNORMAL LOW (ref 0.76–1.27)
EOS (ABSOLUTE): 0.1 x10E3/uL (ref 0.0–0.4)
Eos: 1 %
Ferritin: 438 ng/mL — ABNORMAL HIGH (ref 30–400)
Globulin, Total: 2.8 g/dL (ref 1.5–4.5)
Glucose: 91 mg/dL (ref 70–99)
Hematocrit: 27.1 % — ABNORMAL LOW (ref 37.5–51.0)
Hemoglobin: 9.5 g/dL — ABNORMAL LOW (ref 13.0–17.7)
Immature Grans (Abs): 0 x10E3/uL (ref 0.0–0.1)
Immature Granulocytes: 0 %
Lymphocytes Absolute: 4.4 x10E3/uL — ABNORMAL HIGH (ref 0.7–3.1)
Lymphs: 55 %
MCH: 36.7 pg — ABNORMAL HIGH (ref 26.6–33.0)
MCHC: 35.1 g/dL (ref 31.5–35.7)
MCV: 105 fL — ABNORMAL HIGH (ref 79–97)
Monocytes Absolute: 0.3 x10E3/uL (ref 0.1–0.9)
Monocytes: 4 %
NRBC: 16 % — ABNORMAL HIGH (ref 0–0)
Neutrophils Absolute: 3.1 x10E3/uL (ref 1.4–7.0)
Neutrophils: 39 %
Platelets: 396 x10E3/uL (ref 150–450)
Potassium: 4.8 mmol/L (ref 3.5–5.2)
RBC: 2.59 x10E6/uL — CL (ref 4.14–5.80)
RDW: 17.1 % — ABNORMAL HIGH (ref 11.6–15.4)
Retic Ct Pct: 4.7 % — ABNORMAL HIGH (ref 0.6–2.6)
Sodium: 138 mmol/L (ref 134–144)
Total Protein: 7.4 g/dL (ref 6.0–8.5)
Vit D, 25-Hydroxy: 37.8 ng/mL (ref 30.0–100.0)
WBC: 8.1 x10E3/uL (ref 3.4–10.8)
eGFR: 129 mL/min/1.73 (ref >59)

## 2024-03-20 ENCOUNTER — Ambulatory Visit: Payer: Self-pay | Admitting: Nurse Practitioner

## 2024-06-09 ENCOUNTER — Other Ambulatory Visit: Payer: Self-pay | Admitting: Medical Genetics

## 2024-06-09 ENCOUNTER — Ambulatory Visit: Payer: Medicare (Managed Care) | Admitting: Nurse Practitioner

## 2024-06-09 VITALS — BP 112/59 | HR 61 | Temp 96.9°F | Wt 163.0 lb

## 2024-06-09 DIAGNOSIS — D571 Sickle-cell disease without crisis: Secondary | ICD-10-CM

## 2024-06-09 DIAGNOSIS — Z0000 Encounter for general adult medical examination without abnormal findings: Secondary | ICD-10-CM | POA: Insufficient documentation

## 2024-06-09 DIAGNOSIS — G894 Chronic pain syndrome: Secondary | ICD-10-CM

## 2024-06-09 DIAGNOSIS — E559 Vitamin D deficiency, unspecified: Secondary | ICD-10-CM | POA: Diagnosis not present

## 2024-06-09 DIAGNOSIS — Z13220 Encounter for screening for lipoid disorders: Secondary | ICD-10-CM

## 2024-06-09 DIAGNOSIS — Z720 Tobacco use: Secondary | ICD-10-CM | POA: Insufficient documentation

## 2024-06-09 MED ORDER — FOLIC ACID 1 MG PO TABS: 1.0000 mg | ORAL_TABLET | Freq: Every day | ORAL | 1 refills | Status: AC

## 2024-06-09 MED ORDER — VITAMIN D (ERGOCALCIFEROL) 1.25 MG (50000 UNIT) PO CAPS: 50000.0000 [IU] | ORAL_CAPSULE | ORAL | 1 refills | Status: AC

## 2024-06-09 MED ORDER — IBUPROFEN 800 MG PO TABS: 800.0000 mg | ORAL_TABLET | Freq: Three times a day (TID) | ORAL | 1 refills | Status: AC | PRN

## 2024-06-09 MED ORDER — HYDROXYUREA 500 MG PO CAPS: 1500.0000 mg | ORAL_CAPSULE | Freq: Every day | ORAL | 3 refills | Status: AC

## 2024-06-09 NOTE — Progress Notes (Signed)
 "  Complete physical exam  Patient: Alejandro Soto   DOB: Sep 15, 1985   38 y.o. Male  MRN: [id number]  Subjective:    Chief Complaint  Patient presents with   Sickle Cell Anemia    Follow-up.   Discussed the use of AI scribe software for clinical note transcription with the patient, who gave verbal consent to proceed.  History of Present Illness   Alejandro Soto is a 38 year old man who has a past medical history of DJD (degenerative joint disease), lumbosacral (09/18/2013), Insomnia (08/2019), Neuropathic pain (09/18/2013), Paranoid schizophrenia (HCC) (04/21/2012), Pneumonia, Sickle cell anemia (HCC), Substance abuse (HCC) (10/30/2022), and Vitamin D  deficiency (04/21/2012).  He presents today for a complete physical exam. He reports consuming a general diet with  no beef or pork, does calisthenics for exercises   He has not experienced any sickle cell crises since his last visit. Cold weather exacerbates his symptoms, and he spent the last weekend in bed due to the cold. His sickle cell pain level is five out of ten, primarily affecting his back, neck, and legs.  He is currently taking , hydroxyurea  1500 mg daily vitamin D  50,000 units once a week, folic acid  1 mg daily, and ibuprofen  800 mg as needed every eight hours for sickle cell pain. He does not take gabapentin  regularly as it does not provide significant relief. He occasionally takes Tylenol  650 mg every six hours as needed but does not use it frequently.  He has not received a flu vaccine this year and has lost his glasses, indicating a need for an eye doctor visit, which he last attended in February.  Currently denies fever, chills, chest pain, shortness of breath, abdominal pain, nausea, vomiting He has quit smoking but now vapes, also denies recent use of illicit drugs     Assessment & Plan          Most recent fall risk assessment:    11/30/2023    1:49 PM  Fall Risk   Falls in the past year? 0  Number falls in past yr:  0  Injury with Fall? 0   Risk for fall due to : No Fall Risks  Follow up Falls evaluation completed     Data saved with a previous flowsheet row definition     Most recent depression screenings:    06/09/2024   10:15 AM 03/17/2024    9:55 AM  PHQ 2/9 Scores  PHQ - 2 Score 0 0  PHQ- 9 Score 0 0      Data saved with a previous flowsheet row definition        Patient Care Team: Alejandro Sherwin R, FNP as PCP - General (Nurse Practitioner)   Show/hide medication list[1]  Review of Systems  Constitutional:  Negative for appetite change, chills, fatigue and fever.  HENT:  Negative for congestion, postnasal drip, rhinorrhea and sneezing.   Eyes:  Negative for pain, discharge and itching.  Respiratory:  Negative for cough, shortness of breath and wheezing.   Cardiovascular:  Negative for chest pain, palpitations and leg swelling.  Gastrointestinal:  Negative for abdominal pain, constipation, nausea and vomiting.  Endocrine: Negative for cold intolerance, heat intolerance and polydipsia.  Genitourinary:  Negative for difficulty urinating, dysuria, flank pain and frequency.  Musculoskeletal:  Positive for arthralgias and back pain. Negative for joint swelling and myalgias.  Skin:  Negative for color change, pallor, rash and wound.  Allergic/Immunologic: Positive for immunocompromised state.  Neurological:  Negative for dizziness, facial asymmetry,  weakness, numbness and headaches.  Psychiatric/Behavioral:  Negative for behavioral problems, confusion, self-injury and suicidal ideas.        Objective:     BP (!) 112/59 (BP Location: Right Arm, Patient Position: Sitting, Cuff Size: Normal)   Pulse 61   Temp (!) 96.9 F (36.1 C) (Temporal)   Wt 163 lb (73.9 kg)   SpO2 99%   BMI 24.78 kg/m    Physical Exam Vitals and nursing note reviewed.  Constitutional:      General: He is not in acute distress.    Appearance: Normal appearance. He is not ill-appearing,  toxic-appearing or diaphoretic.  HENT:     Right Ear: Tympanic membrane, ear canal and external ear normal. There is no impacted cerumen.     Left Ear: Tympanic membrane, ear canal and external ear normal. There is no impacted cerumen.     Nose: Nose normal. No congestion or rhinorrhea.     Mouth/Throat:     Mouth: Mucous membranes are moist.     Pharynx: Oropharynx is clear. No oropharyngeal exudate or posterior oropharyngeal erythema.  Eyes:     General: No scleral icterus.       Right eye: No discharge.        Left eye: No discharge.     Extraocular Movements: Extraocular movements intact.     Conjunctiva/sclera: Conjunctivae normal.  Neck:     Vascular: No carotid bruit.  Cardiovascular:     Rate and Rhythm: Normal rate and regular rhythm.     Pulses: Normal pulses.     Heart sounds: Normal heart sounds. No murmur heard.    No friction rub. No gallop.  Pulmonary:     Effort: Pulmonary effort is normal. No respiratory distress.     Breath sounds: Normal breath sounds. No stridor. No wheezing, rhonchi or rales.  Chest:     Chest wall: No tenderness.  Abdominal:     General: Bowel sounds are normal. There is no distension.     Palpations: Abdomen is soft. There is no mass.     Tenderness: There is no abdominal tenderness. There is no right CVA tenderness, left CVA tenderness, guarding or rebound.     Hernia: No hernia is present.  Musculoskeletal:        General: No swelling or signs of injury.     Cervical back: Normal range of motion and neck supple. No rigidity or tenderness.     Right lower leg: No edema.     Left lower leg: No edema.  Lymphadenopathy:     Cervical: No cervical adenopathy.  Skin:    General: Skin is warm and dry.     Capillary Refill: Capillary refill takes less than 2 seconds.     Coloration: Skin is not jaundiced or pale.     Findings: No bruising, erythema, lesion or rash.  Neurological:     Mental Status: He is alert and oriented to person, place,  and time.     Cranial Nerves: No cranial nerve deficit.     Motor: No weakness.     Gait: Gait normal.  Psychiatric:        Mood and Affect: Mood normal.        Behavior: Behavior normal.        Thought Content: Thought content normal.        Judgment: Judgment normal.     No results found for any visits on 06/09/24.     Assessment & Plan:  Routine Health Maintenance and Physical Exam  Immunization History  Administered Date(s) Administered   Influenza Whole 03/21/2012   Influenza,inj,Quad PF,6+ Mos 03/21/2013, 03/01/2014   Pneumococcal Conjugate-13 03/21/2012   Pneumococcal Polysaccharide-23 07/15/2014   Tdap 08/14/2016    Health Maintenance  Topic Date Due   COVID-19 Vaccine (1) Never done   Hepatitis B Vaccines 19-59 Average Risk (1 of 3 - 19+ 3-dose series) Never done   HPV VACCINES (1 - 3-dose SCDM series) Never done   Influenza Vaccine  09/19/2024 (Originally 01/21/2024)   Medicare Annual Wellness (AWV)  11/29/2024   DTaP/Tdap/Td (2 - Td or Tdap) 08/14/2026   HIV Screening  Completed   Meningococcal B Vaccine  Aged Out   Pneumococcal Vaccine  Discontinued   Hepatitis C Screening  Discontinued    Discussed health benefits of physical activity, and encouraged him to engage in regular exercise appropriate for his age and condition.  Problem List Items Addressed This Visit       Other   Vitamin D  deficiency   Continue vitamin D  50,000 units once weekly Last vitamin D  Lab Results  Component Value Date   VD25OH 37.8 03/17/2024         Relevant Medications   Vitamin D , Ergocalciferol , (DRISDOL ) 1.25 MG (50000 UNIT) CAPS capsule   Other Relevant Orders   Sickle Cell Panel   Hb-SS disease without crisis (HCC)   We discussed the need for good hydration, monitoring of hydration status, avoidance of heat, cold, stress, and infection triggers. We discussed the need to be adherent with taking Hydrea  and other home medications. Patient was reminded of the need to  seek medical attention immediately if any symptom of bleeding, anemia, or infection occurs.   Continue ibuprofen  800 mg 3 times daily as needed mild pain,  General health maintenance Encouraged flu vaccination. Last eye exam in February, recommended annual exam for sickle cell-related eye damage. - Encouraged flu vaccination. - Recommended annual eye exam.         Relevant Medications   folic acid  (FOLVITE ) 1 MG tablet   ibuprofen  (ADVIL ) 800 MG tablet   hydroxyurea  (HYDREA ) 500 MG capsule   Other Relevant Orders   Sickle Cell Panel   Chronic pain syndrome   Relevant Medications   ibuprofen  (ADVIL ) 800 MG tablet   Annual physical exam - Primary   Annual exam as documented.  Counseling done include healthy lifestyle involving committing to 150 minutes of exercise per week, heart healthy diet, and attaining healthy weight. The importance of adequate sleep also discussed.  Regular use of seat belt  also discussed . Changes in health habits are decided on by patient with goals and time frames set for achieving them. Immunization and cancer screening  needs are specifically addressed at this visit.    Up-to-date with hepatitis B vaccine declined flu vaccine.  Encouraged to consider getting flu vaccine and HPV vaccine at the pharmacy      Vapes nicotine  containing substance   Cessation encouraged      Other Visit Diagnoses       Screening for lipoid disorders       Relevant Orders   Lipid panel      Return in about 3 months (around 09/07/2024) for SCD.     Alonzo Owczarzak Soto Jakai Risse, FNP      [1]  Outpatient Medications Prior to Visit  Medication Sig   gabapentin  (NEURONTIN ) 300 MG capsule Take 1 capsule (300 mg total) by mouth 3 (three) times daily.   [  DISCONTINUED] folic acid  (FOLVITE ) 1 MG tablet Take 1 tablet (1 mg total) by mouth daily.   [DISCONTINUED] hydroxyurea  (HYDREA ) 500 MG capsule Take 3 capsules (1,500 mg total) by mouth daily. May take with food to minimize GI  side effects.   [DISCONTINUED] ibuprofen  (ADVIL ) 800 MG tablet Take 1 tablet (800 mg total) by mouth every 8 (eight) hours as needed for mild pain (pain score 1-3).   [DISCONTINUED] Vitamin D , Ergocalciferol , (DRISDOL ) 1.25 MG (50000 UNIT) CAPS capsule Take 1 capsule (50,000 Units total) by mouth every 7 (seven) days.   naloxone  (NARCAN ) nasal spray 4 mg/0.1 mL Use 1 nasal spray as a single dose in one nostril; may repeat with a new nasal spray every 2 to 3 minutes in alternating nostrils if needed for oopoidoverdose   [DISCONTINUED] Nutritional Supplements (EAA SUPPLEMENT PO) Take by mouth. (Patient not taking: Reported on 06/09/2024)   [DISCONTINUED] REXULTI 1 MG TABS  (Patient not taking: Reported on 06/09/2024)   No facility-administered medications prior to visit.   "

## 2024-06-09 NOTE — Assessment & Plan Note (Signed)
 Annual exam as documented.  Counseling done include healthy lifestyle involving committing to 150 minutes of exercise per week, heart healthy diet, and attaining healthy weight. The importance of adequate sleep also discussed.  Regular use of seat belt  also discussed . Changes in health habits are decided on by patient with goals and time frames set for achieving them. Immunization and cancer screening  needs are specifically addressed at this visit.    Up-to-date with hepatitis B vaccine declined flu vaccine.  Encouraged to consider getting flu vaccine and HPV vaccine at the pharmacy

## 2024-06-09 NOTE — Assessment & Plan Note (Signed)
 We discussed the need for good hydration, monitoring of hydration status, avoidance of heat, cold, stress, and infection triggers. We discussed the need to be adherent with taking Hydrea  and other home medications. Patient was reminded of the need to seek medical attention immediately if any symptom of bleeding, anemia, or infection occurs.   Continue ibuprofen  800 mg 3 times daily as needed mild pain,  General health maintenance Encouraged flu vaccination. Last eye exam in February, recommended annual exam for sickle cell-related eye damage. - Encouraged flu vaccination. - Recommended annual eye exam.

## 2024-06-09 NOTE — Assessment & Plan Note (Signed)
 Continue vitamin D  50,000 units once weekly Last vitamin D  Lab Results  Component Value Date   VD25OH 37.8 03/17/2024

## 2024-06-09 NOTE — Patient Instructions (Signed)

## 2024-06-09 NOTE — Assessment & Plan Note (Signed)
 Cessation encouraged

## 2024-06-10 LAB — LIPID PANEL
Chol/HDL Ratio: 2.2 ratio (ref 0.0–5.0)
Cholesterol, Total: 144 mg/dL (ref 100–199)
HDL: 66 mg/dL
LDL Chol Calc (NIH): 68 mg/dL (ref 0–99)
Triglycerides: 41 mg/dL (ref 0–149)
VLDL Cholesterol Cal: 10 mg/dL (ref 5–40)

## 2024-06-10 LAB — CMP14+CBC/D/PLT+FER+RETIC+V...
ALT: 13 IU/L (ref 0–44)
AST: 20 IU/L (ref 0–40)
Albumin: 4.2 g/dL (ref 4.1–5.1)
Alkaline Phosphatase: 56 IU/L (ref 47–123)
BUN/Creatinine Ratio: 11 (ref 9–20)
BUN: 8 mg/dL (ref 6–20)
Basophils Absolute: 0.1 x10E3/uL (ref 0.0–0.2)
Basos: 1 %
Bilirubin Total: 1 mg/dL (ref 0.0–1.2)
CO2: 22 mmol/L (ref 20–29)
Calcium: 9.3 mg/dL (ref 8.7–10.2)
Chloride: 99 mmol/L (ref 96–106)
Creatinine, Ser: 0.72 mg/dL — ABNORMAL LOW (ref 0.76–1.27)
EOS (ABSOLUTE): 0.3 x10E3/uL (ref 0.0–0.4)
Eos: 2 %
Ferritin: 446 ng/mL — ABNORMAL HIGH (ref 30–400)
Globulin, Total: 2.5 g/dL (ref 1.5–4.5)
Glucose: 87 mg/dL (ref 70–99)
Hematocrit: 27.4 % — ABNORMAL LOW (ref 37.5–51.0)
Hemoglobin: 9 g/dL — ABNORMAL LOW (ref 13.0–17.7)
Immature Grans (Abs): 0.1 x10E3/uL (ref 0.0–0.1)
Immature Granulocytes: 1 %
Lymphocytes Absolute: 5.7 x10E3/uL — ABNORMAL HIGH (ref 0.7–3.1)
Lymphs: 53 %
MCH: 35.4 pg — ABNORMAL HIGH (ref 26.6–33.0)
MCHC: 32.8 g/dL (ref 31.5–35.7)
MCV: 108 fL — ABNORMAL HIGH (ref 79–97)
Monocytes Absolute: 0.6 x10E3/uL (ref 0.1–0.9)
Monocytes: 5 %
NRBC: 6 % — ABNORMAL HIGH (ref 0–0)
Neutrophils Absolute: 4 x10E3/uL (ref 1.4–7.0)
Neutrophils: 38 %
Platelets: 290 x10E3/uL (ref 150–450)
Potassium: 4.2 mmol/L (ref 3.5–5.2)
RBC: 2.54 x10E6/uL — CL (ref 4.14–5.80)
RDW: 16.6 % — ABNORMAL HIGH (ref 11.6–15.4)
Retic Ct Pct: 5.3 % — ABNORMAL HIGH (ref 0.6–2.6)
Sodium: 137 mmol/L (ref 134–144)
Total Protein: 6.7 g/dL (ref 6.0–8.5)
Vit D, 25-Hydroxy: 16.8 ng/mL — ABNORMAL LOW (ref 30.0–100.0)
WBC: 10.6 x10E3/uL (ref 3.4–10.8)
eGFR: 120 mL/min/1.73

## 2024-06-12 ENCOUNTER — Ambulatory Visit: Payer: Self-pay | Admitting: Nurse Practitioner

## 2024-06-16 ENCOUNTER — Ambulatory Visit: Payer: Self-pay | Admitting: Nurse Practitioner

## 2024-08-04 ENCOUNTER — Other Ambulatory Visit (HOSPITAL_COMMUNITY): Payer: Medicare (Managed Care)

## 2024-09-08 ENCOUNTER — Ambulatory Visit: Payer: Self-pay | Admitting: Nurse Practitioner

## 2024-11-30 ENCOUNTER — Ambulatory Visit: Payer: Self-pay
